# Patient Record
Sex: Female | Born: 1940 | Race: Black or African American | Hispanic: No | Smoking: Former smoker
Health system: Southern US, Community
[De-identification: ages and names within clinical notes are randomized; demographics above are authoritative.]

## PROBLEM LIST (undated history)

## (undated) DIAGNOSIS — E7800 Pure hypercholesterolemia, unspecified: Secondary | ICD-10-CM

## (undated) DIAGNOSIS — I209 Angina pectoris, unspecified: Secondary | ICD-10-CM

## (undated) DIAGNOSIS — IMO0001 Reserved for inherently not codable concepts without codable children: Secondary | ICD-10-CM

## (undated) DIAGNOSIS — D649 Anemia, unspecified: Secondary | ICD-10-CM

## (undated) DIAGNOSIS — Z8719 Personal history of other diseases of the digestive system: Secondary | ICD-10-CM

## (undated) DIAGNOSIS — J189 Pneumonia, unspecified organism: Secondary | ICD-10-CM

## (undated) DIAGNOSIS — I350 Nonrheumatic aortic (valve) stenosis: Secondary | ICD-10-CM

## (undated) DIAGNOSIS — Z952 Presence of prosthetic heart valve: Secondary | ICD-10-CM

## (undated) DIAGNOSIS — IMO0002 Reserved for concepts with insufficient information to code with codable children: Secondary | ICD-10-CM

## (undated) DIAGNOSIS — M1990 Unspecified osteoarthritis, unspecified site: Secondary | ICD-10-CM

## (undated) DIAGNOSIS — Z86010 Personal history of colonic polyps: Principal | ICD-10-CM

## (undated) DIAGNOSIS — R011 Cardiac murmur, unspecified: Secondary | ICD-10-CM

## (undated) DIAGNOSIS — K219 Gastro-esophageal reflux disease without esophagitis: Secondary | ICD-10-CM

## (undated) DIAGNOSIS — I10 Essential (primary) hypertension: Secondary | ICD-10-CM

## (undated) DIAGNOSIS — I2510 Atherosclerotic heart disease of native coronary artery without angina pectoris: Secondary | ICD-10-CM

## (undated) DIAGNOSIS — R42 Dizziness and giddiness: Secondary | ICD-10-CM

## (undated) HISTORY — DX: Nonrheumatic aortic (valve) stenosis: I35.0

## (undated) HISTORY — PX: COLONOSCOPY: SHX174

## (undated) HISTORY — DX: Reserved for inherently not codable concepts without codable children: IMO0001

## (undated) HISTORY — DX: Reserved for concepts with insufficient information to code with codable children: IMO0002

## (undated) HISTORY — DX: Personal history of colonic polyps: Z86.010

---
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
## 1969-03-05 DIAGNOSIS — J189 Pneumonia, unspecified organism: Secondary | ICD-10-CM

## 1969-03-05 HISTORY — DX: Pneumonia, unspecified organism: J18.9

## 1978-07-05 HISTORY — PX: TUBAL LIGATION: SHX77

## 1983-07-06 HISTORY — PX: ABDOMINAL HYSTERECTOMY: SHX81

## 1999-07-13 ENCOUNTER — Encounter: Admission: RE | Admit: 1999-07-13 | Discharge: 1999-08-18 | Payer: Self-pay | Admitting: Internal Medicine

## 1999-12-27 ENCOUNTER — Encounter: Payer: Self-pay | Admitting: Emergency Medicine

## 1999-12-27 ENCOUNTER — Emergency Department (HOSPITAL_COMMUNITY): Admission: EM | Admit: 1999-12-27 | Discharge: 1999-12-27 | Payer: Self-pay | Admitting: Emergency Medicine

## 2003-10-01 ENCOUNTER — Emergency Department (HOSPITAL_COMMUNITY): Admission: EM | Admit: 2003-10-01 | Discharge: 2003-10-01 | Payer: Self-pay | Admitting: Emergency Medicine

## 2004-03-30 ENCOUNTER — Emergency Department (HOSPITAL_COMMUNITY): Admission: EM | Admit: 2004-03-30 | Discharge: 2004-03-30 | Payer: Self-pay | Admitting: Family Medicine

## 2004-04-12 ENCOUNTER — Emergency Department (HOSPITAL_COMMUNITY): Admission: EM | Admit: 2004-04-12 | Discharge: 2004-04-12 | Payer: Self-pay | Admitting: Emergency Medicine

## 2004-06-03 ENCOUNTER — Ambulatory Visit: Payer: Self-pay | Admitting: Internal Medicine

## 2004-06-15 ENCOUNTER — Ambulatory Visit: Payer: Self-pay | Admitting: Internal Medicine

## 2005-02-25 ENCOUNTER — Encounter: Admission: RE | Admit: 2005-02-25 | Discharge: 2005-02-25 | Payer: Self-pay | Admitting: Orthopaedic Surgery

## 2005-03-22 ENCOUNTER — Encounter: Admission: RE | Admit: 2005-03-22 | Discharge: 2005-03-22 | Payer: Self-pay | Admitting: Orthopaedic Surgery

## 2005-04-08 ENCOUNTER — Encounter: Admission: RE | Admit: 2005-04-08 | Discharge: 2005-04-08 | Payer: Self-pay | Admitting: Orthopaedic Surgery

## 2006-05-02 ENCOUNTER — Emergency Department (HOSPITAL_COMMUNITY): Admission: EM | Admit: 2006-05-02 | Discharge: 2006-05-02 | Payer: Self-pay | Admitting: Family Medicine

## 2007-04-07 ENCOUNTER — Emergency Department (HOSPITAL_COMMUNITY): Admission: EM | Admit: 2007-04-07 | Discharge: 2007-04-07 | Payer: Self-pay | Admitting: Emergency Medicine

## 2007-08-25 ENCOUNTER — Emergency Department (HOSPITAL_COMMUNITY): Admission: EM | Admit: 2007-08-25 | Discharge: 2007-08-25 | Payer: Self-pay | Admitting: Emergency Medicine

## 2010-04-14 ENCOUNTER — Emergency Department (HOSPITAL_COMMUNITY): Admission: EM | Admit: 2010-04-14 | Discharge: 2010-04-14 | Payer: Self-pay | Admitting: Family Medicine

## 2010-07-26 ENCOUNTER — Encounter: Payer: Self-pay | Admitting: Orthopaedic Surgery

## 2010-10-23 ENCOUNTER — Other Ambulatory Visit (HOSPITAL_COMMUNITY): Payer: Self-pay | Admitting: Internal Medicine

## 2010-10-23 DIAGNOSIS — R011 Cardiac murmur, unspecified: Secondary | ICD-10-CM

## 2010-10-26 ENCOUNTER — Ambulatory Visit (HOSPITAL_COMMUNITY): Payer: Medicare Other | Attending: Internal Medicine | Admitting: Radiology

## 2010-10-26 DIAGNOSIS — E669 Obesity, unspecified: Secondary | ICD-10-CM | POA: Insufficient documentation

## 2010-10-26 DIAGNOSIS — R011 Cardiac murmur, unspecified: Secondary | ICD-10-CM | POA: Insufficient documentation

## 2010-10-26 DIAGNOSIS — I10 Essential (primary) hypertension: Secondary | ICD-10-CM | POA: Insufficient documentation

## 2010-10-26 DIAGNOSIS — F17200 Nicotine dependence, unspecified, uncomplicated: Secondary | ICD-10-CM | POA: Insufficient documentation

## 2013-02-01 ENCOUNTER — Other Ambulatory Visit (HOSPITAL_COMMUNITY): Payer: Self-pay | Admitting: Internal Medicine

## 2013-02-01 ENCOUNTER — Encounter (HOSPITAL_COMMUNITY): Payer: Self-pay | Admitting: Internal Medicine

## 2013-02-01 DIAGNOSIS — R011 Cardiac murmur, unspecified: Secondary | ICD-10-CM

## 2013-02-02 ENCOUNTER — Other Ambulatory Visit (HOSPITAL_COMMUNITY): Payer: Medicare Other

## 2013-02-07 ENCOUNTER — Ambulatory Visit (HOSPITAL_COMMUNITY): Payer: Medicare Other | Attending: Internal Medicine | Admitting: Radiology

## 2013-02-07 DIAGNOSIS — E669 Obesity, unspecified: Secondary | ICD-10-CM | POA: Insufficient documentation

## 2013-02-07 DIAGNOSIS — I10 Essential (primary) hypertension: Secondary | ICD-10-CM | POA: Insufficient documentation

## 2013-02-07 DIAGNOSIS — I080 Rheumatic disorders of both mitral and aortic valves: Secondary | ICD-10-CM | POA: Insufficient documentation

## 2013-02-07 DIAGNOSIS — R011 Cardiac murmur, unspecified: Secondary | ICD-10-CM

## 2013-02-07 DIAGNOSIS — I359 Nonrheumatic aortic valve disorder, unspecified: Secondary | ICD-10-CM

## 2013-02-07 DIAGNOSIS — F17200 Nicotine dependence, unspecified, uncomplicated: Secondary | ICD-10-CM | POA: Insufficient documentation

## 2013-02-07 NOTE — Progress Notes (Signed)
Echocardiogram performed.  

## 2013-08-07 ENCOUNTER — Ambulatory Visit (HOSPITAL_COMMUNITY)
Admission: RE | Admit: 2013-08-07 | Discharge: 2013-08-07 | Disposition: A | Discharge status: Home / Self Care | Payer: Medicare Other | Source: Ambulatory Visit | Attending: Cardiology | Admitting: Cardiology

## 2013-08-07 ENCOUNTER — Encounter (HOSPITAL_COMMUNITY)
Admission: RE | Disposition: A | Discharge status: Home / Self Care | Payer: Self-pay | Source: Ambulatory Visit | Attending: Cardiology

## 2013-08-07 DIAGNOSIS — I2789 Other specified pulmonary heart diseases: Secondary | ICD-10-CM | POA: Insufficient documentation

## 2013-08-07 DIAGNOSIS — R0989 Other specified symptoms and signs involving the circulatory and respiratory systems: Secondary | ICD-10-CM | POA: Insufficient documentation

## 2013-08-07 DIAGNOSIS — I739 Peripheral vascular disease, unspecified: Secondary | ICD-10-CM | POA: Insufficient documentation

## 2013-08-07 DIAGNOSIS — F17200 Nicotine dependence, unspecified, uncomplicated: Secondary | ICD-10-CM | POA: Insufficient documentation

## 2013-08-07 DIAGNOSIS — I10 Essential (primary) hypertension: Secondary | ICD-10-CM | POA: Insufficient documentation

## 2013-08-07 DIAGNOSIS — I2584 Coronary atherosclerosis due to calcified coronary lesion: Secondary | ICD-10-CM | POA: Insufficient documentation

## 2013-08-07 DIAGNOSIS — E785 Hyperlipidemia, unspecified: Secondary | ICD-10-CM | POA: Insufficient documentation

## 2013-08-07 DIAGNOSIS — I2510 Atherosclerotic heart disease of native coronary artery without angina pectoris: Secondary | ICD-10-CM | POA: Insufficient documentation

## 2013-08-07 DIAGNOSIS — I2582 Chronic total occlusion of coronary artery: Secondary | ICD-10-CM | POA: Insufficient documentation

## 2013-08-07 DIAGNOSIS — K219 Gastro-esophageal reflux disease without esophagitis: Secondary | ICD-10-CM | POA: Insufficient documentation

## 2013-08-07 DIAGNOSIS — R0609 Other forms of dyspnea: Secondary | ICD-10-CM | POA: Insufficient documentation

## 2013-08-07 DIAGNOSIS — R9439 Abnormal result of other cardiovascular function study: Secondary | ICD-10-CM | POA: Insufficient documentation

## 2013-08-07 DIAGNOSIS — M129 Arthropathy, unspecified: Secondary | ICD-10-CM | POA: Insufficient documentation

## 2013-08-07 HISTORY — PX: LEFT AND RIGHT HEART CATHETERIZATION WITH CORONARY ANGIOGRAM: SHX5449

## 2013-08-07 HISTORY — PX: CARDIAC CATHETERIZATION: SHX172

## 2013-08-07 LAB — POCT I-STAT 3, ART BLOOD GAS (G3+)
Acid-Base Excess: 1 mmol/L (ref 0.0–2.0)
Bicarbonate: 26.5 mEq/L — ABNORMAL HIGH (ref 20.0–24.0)
O2 Saturation: 94 %
TCO2: 28 mmol/L (ref 0–100)
pCO2 arterial: 46.5 mmHg — ABNORMAL HIGH (ref 35.0–45.0)
pH, Arterial: 7.364 (ref 7.350–7.450)
pO2, Arterial: 74 mmHg — ABNORMAL LOW (ref 80.0–100.0)

## 2013-08-07 LAB — POCT I-STAT 3, VENOUS BLOOD GAS (G3P V)
Acid-Base Excess: 1 mmol/L (ref 0.0–2.0)
Bicarbonate: 27.6 mEq/L — ABNORMAL HIGH (ref 20.0–24.0)
O2 Saturation: 67 %
TCO2: 29 mmol/L (ref 0–100)
pCO2, Ven: 51.2 mmHg — ABNORMAL HIGH (ref 45.0–50.0)
pH, Ven: 7.34 — ABNORMAL HIGH (ref 7.250–7.300)
pO2, Ven: 38 mmHg (ref 30.0–45.0)

## 2013-08-07 SURGERY — LEFT AND RIGHT HEART CATHETERIZATION WITH CORONARY ANGIOGRAM
Anesthesia: LOCAL

## 2013-08-07 MED ORDER — SODIUM CHLORIDE 0.9 % IV SOLN: 1.0000 mL/kg/h | INTRAVENOUS | Status: DC

## 2013-08-07 MED ORDER — ASPIRIN 81 MG PO CHEW
81.0000 mg | CHEWABLE_TABLET | ORAL | Status: AC
  Administered 2013-08-07: 81 mg via ORAL
  Filled 2013-08-07: qty 1

## 2013-08-07 MED ORDER — ONDANSETRON HCL 4 MG/2ML IJ SOLN: 4.0000 mg | Freq: Four times a day (QID) | INTRAMUSCULAR | Status: DC | PRN

## 2013-08-07 MED ORDER — PRASUGREL HCL 10 MG PO TABS: 10.0000 mg | ORAL_TABLET | Freq: Every day | ORAL | Status: DC

## 2013-08-07 MED ORDER — SODIUM CHLORIDE 0.9 % IV SOLN
INTRAVENOUS | Status: DC
  Administered 2013-08-07: 08:00:00 via INTRAVENOUS

## 2013-08-07 MED ORDER — ACETAMINOPHEN 325 MG PO TABS: 650.0000 mg | ORAL_TABLET | ORAL | Status: DC | PRN

## 2013-08-07 MED ORDER — SODIUM CHLORIDE 0.9 % IJ SOLN: 3.0000 mL | INTRAMUSCULAR | Status: DC | PRN

## 2013-08-07 MED ORDER — VERAPAMIL HCL 2.5 MG/ML IV SOLN
INTRAVENOUS | Status: AC
  Filled 2013-08-07: qty 2

## 2013-08-07 MED ORDER — LIDOCAINE HCL (PF) 1 % IJ SOLN
INTRAMUSCULAR | Status: AC
  Filled 2013-08-07: qty 30

## 2013-08-07 MED ORDER — MIDAZOLAM HCL 2 MG/2ML IJ SOLN
INTRAMUSCULAR | Status: AC
  Filled 2013-08-07: qty 2

## 2013-08-07 MED ORDER — NITROGLYCERIN 0.4 MG SL SUBL: 0.4000 mg | SUBLINGUAL_TABLET | SUBLINGUAL | Status: AC | PRN

## 2013-08-07 MED ORDER — HEPARIN (PORCINE) IN NACL 2-0.9 UNIT/ML-% IJ SOLN
INTRAMUSCULAR | Status: AC
  Filled 2013-08-07: qty 1000

## 2013-08-07 MED ORDER — ISOSORBIDE MONONITRATE ER 60 MG PO TB24: 60.0000 mg | ORAL_TABLET | Freq: Every day | ORAL | Status: AC

## 2013-08-07 MED ORDER — HYDROMORPHONE HCL PF 1 MG/ML IJ SOLN
INTRAMUSCULAR | Status: AC
  Filled 2013-08-07: qty 1

## 2013-08-07 MED ORDER — SODIUM CHLORIDE 0.9 % IV SOLN: 250.0000 mL | INTRAVENOUS | Status: DC | PRN

## 2013-08-07 MED ORDER — ATORVASTATIN CALCIUM 80 MG PO TABS: 80.0000 mg | ORAL_TABLET | Freq: Every day | ORAL | Status: DC

## 2013-08-07 MED ORDER — NITROGLYCERIN 0.2 MG/ML ON CALL CATH LAB
INTRAVENOUS | Status: AC
  Filled 2013-08-07: qty 1

## 2013-08-07 MED ORDER — SODIUM CHLORIDE 0.9 % IJ SOLN: 3.0000 mL | Freq: Two times a day (BID) | INTRAMUSCULAR | Status: DC

## 2013-08-07 NOTE — H&P (Signed)
  Please see office visit notes for complete details of HPI.  

## 2013-08-07 NOTE — Discharge Instructions (Signed)

## 2013-08-07 NOTE — Interval H&P Note (Signed)
History and Physical Interval Note:  08/09/5807 9:83 AM  Armanda Magic Stokely  has presented today for surgery, with the diagnosis of abnormal stress test/shortness of breath  The various methods of treatment have been discussed with the patient and family. After consideration of risks, benefits and other options for treatment, the patient has consented to  Procedure(s): LEFT AND RIGHT HEART CATHETERIZATION WITH CORONARY ANGIOGRAM (N/A) possible angioplasty as a surgical intervention .  The patient's history has been reviewed, patient examined, no change in status, stable for surgery.  I have reviewed the patient's chart and labs.  Questions were answered to the patient's satisfaction.   Cath Lab Visit (complete for each Cath Lab visit)  Clinical Evaluation Leading to the Procedure:   ACS: no  Non-ACS:    Anginal Classification: CCS III  Anti-ischemic medical therapy: Minimal Therapy (1 class of medications)  Non-Invasive Test Results: Intermediate-risk stress test findings: cardiac mortality 1-3%/year  Prior CABG: No previous CABG        [facility]

## 2013-08-08 NOTE — CV Procedure (Signed)
Procedures performed: Right and left heart catheterization and calculation of cardiac output and cardiac index by Fick. Right radial artery access and Right antecubital vein access was utilized for performing the procedure.   Indication: Patient is a 73 year-old female with shortness of breath and dyspnea on exertion.  She had undergone initially a routine treadmill excess stress test, but she was unable to walk even for one and one half minutes and had to be discontinued due to severe dyspnea.  Eventual Lexiscan stress test was performed on 04/27/2013 and this revealed severe inferior, inferoapical and apical ischemia.  Due to markedly abnormal stress test and class III symptoms of dyspnea she was brought to the angiography suite to evaluate for coronary anatomy.  Right heart catheterization is being performed to evaluate for pulmonary hypertension.  Right heart catheterization technique: A 5 French  sheath introduced into right AC vein access. A 5 French Swan-Ganz catheter was advanced with balloon inflated on the sheath under fluoroscopic guidance into first the right atrium followed by the right ventricle and into the pulmonary artery to pulmonary artery wedge position. Hemodynamics were obtained in a locations.  After hemodynamics were completed, samples were taken for SaO2% measurement to be used in [facility]Index catheterization.  The catheter was then pulled back the balloon down and then completely out of the body.   Left Heart Catheterization   First a 36F [place] TIG 4 catheter was advanced over standard J-wire into the ascending aorta and used to engage first the Left and Right Coronary Artery. Multiple cineangiographic views of the Left then Right Coronary Artery system(s) were performed.  A Same catheter was used to cross the aortic valve for measurement Left Ventricular Hemodynamics without any difficulty. Left ventriculography was then performed in the RAO projection. Hemodynamics were then  resampled and the catheter pulled back across the aortic valve for measurement of "pullback" gradient. The catheter was then removed the body over wire. All exchanges were made over standard J wire. Intracoronary nitroglycerin was administered at various times.  Procedural data:  Right Heart: RA pressure 11/7  Mean 6 mm mercury. .  RV pressure 33/3 and Right ventricular EDP 6 mm Hg. PA pressure 32/12 with a mean of 21  mm mercury. PA saturation 67%.  Pulmonary capillary wedge 16/11 with a mean of 12 mm Hg. Aortic saturation 94%.  Cardiac output was 4.92 with cardiac index of 2.37  by Fick.   Left Heart hemodynamics: left ventricular pressure was 010/2 with end-diastolic Escher 13 mmHg.  Aortic pressure 141/80 with a mean of 103 mmHg.  There was no significant pressure gradient across the aortic valve.  There is no evidence of aortic stenosis.  Angiographic data  Left ventricle: Performed.  Left systolic shows normal ejection fraction of 55-60% No regional wall motion abnormality.  Right coronary artery: it's a dominant vessel, is occluded in the midsegment, faint ipsilateral collateral filling of the distal vessel is evident.  Total occlusion occurs just proximal to the bifurcation of PLV and PDA branches.  The PDA and by mouth branches are collateralized by the left system.  The vessel is at least 2.5 mm and appears to be amenable for percutaneous revascularization.  Left main coronary artery: Normal. No stenosis.   LAD: Large.Krystal Clark origin to a moderate sized diagonal 1, D2  Which have mild ostial disease.  The LAD itself as mild noncritical coronary artery disease in the proximal and midsegment, mild calcification is also evident.  Distal LAD near the apex  has a eccentric 80-90% stenosis.   Circumflex coronary artery: It is codominant with the right coronary artery. It s a very large vessel.  Proximal circumflex has tandem 90-95% stenosis followed by ectasia in both poststenotic areas.  This  is followed by origin of a large OM1.  At the bifurcation of circumflex and OM1 there is a 70-80% stenosis followed by a distal circumflex which has a 90% stenosis.  The anatomy is very complex.  The presence of ectasia and presence of bifurcation stenosis places this at a high risk anatomy.  The anatomy is still conducive for percutaneously revascularization.  Recommendation: I discussed the findings of the precatheterization with patient. Patient will be followed up in the office to discuss revascularization strategy, the anatomy appears to be feasible for percutaneous coronary angioplasty.  The right coronary artery also appears to be amenable for percutaneous revascularization.  I would like to see her back in the office, I have started her on Imdur and also Brilinta 90 mg by mouth twice a day in preparation for angioplasty. She has mild pulmonary hypertension, preserved cardiac output and cardiac index.  No evidence of aortic stenosis.

## 2013-08-16 ENCOUNTER — Encounter (HOSPITAL_COMMUNITY): Payer: Self-pay | Admitting: Pharmacy Technician

## 2013-08-17 ENCOUNTER — Ambulatory Visit (HOSPITAL_COMMUNITY)
Admission: RE | Admit: 2013-08-17 | Discharge: 2013-08-19 | Disposition: A | Discharge status: Home / Self Care | Payer: Medicare Other | Source: Ambulatory Visit | Attending: Cardiology | Admitting: Cardiology

## 2013-08-17 ENCOUNTER — Encounter (HOSPITAL_COMMUNITY)
Admission: RE | Disposition: A | Discharge status: Home / Self Care | Payer: Medicare Other | Source: Ambulatory Visit | Attending: Cardiology

## 2013-08-17 ENCOUNTER — Encounter (HOSPITAL_COMMUNITY): Payer: Self-pay | Admitting: General Practice

## 2013-08-17 DIAGNOSIS — E669 Obesity, unspecified: Secondary | ICD-10-CM | POA: Insufficient documentation

## 2013-08-17 DIAGNOSIS — Z6834 Body mass index (BMI) 34.0-34.9, adult: Secondary | ICD-10-CM | POA: Insufficient documentation

## 2013-08-17 DIAGNOSIS — I10 Essential (primary) hypertension: Secondary | ICD-10-CM | POA: Diagnosis not present

## 2013-08-17 DIAGNOSIS — I209 Angina pectoris, unspecified: Secondary | ICD-10-CM | POA: Diagnosis not present

## 2013-08-17 DIAGNOSIS — I2582 Chronic total occlusion of coronary artery: Secondary | ICD-10-CM | POA: Diagnosis not present

## 2013-08-17 DIAGNOSIS — Z9861 Coronary angioplasty status: Secondary | ICD-10-CM

## 2013-08-17 DIAGNOSIS — K219 Gastro-esophageal reflux disease without esophagitis: Secondary | ICD-10-CM | POA: Insufficient documentation

## 2013-08-17 DIAGNOSIS — I2510 Atherosclerotic heart disease of native coronary artery without angina pectoris: Secondary | ICD-10-CM

## 2013-08-17 DIAGNOSIS — E785 Hyperlipidemia, unspecified: Secondary | ICD-10-CM | POA: Insufficient documentation

## 2013-08-17 HISTORY — PX: CORONARY ANGIOPLASTY WITH STENT PLACEMENT: SHX49

## 2013-08-17 HISTORY — DX: Personal history of other diseases of the digestive system: Z87.19

## 2013-08-17 HISTORY — DX: Pneumonia, unspecified organism: J18.9

## 2013-08-17 HISTORY — PX: PERCUTANEOUS CORONARY STENT INTERVENTION (PCI-S): SHX5485

## 2013-08-17 HISTORY — DX: Cardiac murmur, unspecified: R01.1

## 2013-08-17 HISTORY — DX: Pure hypercholesterolemia, unspecified: E78.00

## 2013-08-17 HISTORY — DX: Gastro-esophageal reflux disease without esophagitis: K21.9

## 2013-08-17 HISTORY — DX: Unspecified osteoarthritis, unspecified site: M19.90

## 2013-08-17 HISTORY — DX: Essential (primary) hypertension: I10

## 2013-08-17 LAB — BASIC METABOLIC PANEL
BUN: 19 mg/dL (ref 6–23)
CO2: 25 mEq/L (ref 19–32)
Calcium: 9 mg/dL (ref 8.4–10.5)
Chloride: 105 mEq/L (ref 96–112)
Creatinine, Ser: 1.03 mg/dL (ref 0.50–1.10)
GFR calc Af Amer: 62 mL/min — ABNORMAL LOW (ref >90)
GFR calc non Af Amer: 54 mL/min — ABNORMAL LOW (ref >90)
Glucose, Bld: 103 mg/dL — ABNORMAL HIGH (ref 70–99)
Potassium: 4 mEq/L (ref 3.7–5.3)
Sodium: 141 mEq/L (ref 137–147)

## 2013-08-17 LAB — CBC
HCT: 36.4 % (ref 36.0–46.0)
Hemoglobin: 13.2 g/dL (ref 12.0–15.0)
MCH: 28.5 pg (ref 26.0–34.0)
MCHC: 36.3 g/dL — ABNORMAL HIGH (ref 30.0–36.0)
MCV: 78.6 fL (ref 78.0–100.0)
Platelets: 204 10*3/uL (ref 150–400)
RBC: 4.63 MIL/uL (ref 3.87–5.11)
RDW: 14.4 % (ref 11.5–15.5)
WBC: 6.9 10*3/uL (ref 4.0–10.5)

## 2013-08-17 LAB — PROTIME-INR
INR: 1.09 (ref 0.00–1.49)
Prothrombin Time: 13.9 seconds (ref 11.6–15.2)

## 2013-08-17 LAB — MRSA PCR SCREENING: MRSA by PCR: NEGATIVE

## 2013-08-17 LAB — POCT ACTIVATED CLOTTING TIME: Activated Clotting Time: 409 seconds

## 2013-08-17 SURGERY — PERCUTANEOUS CORONARY STENT INTERVENTION (PCI-S)
Anesthesia: LOCAL

## 2013-08-17 MED ORDER — ALUM & MAG HYDROXIDE-SIMETH 200-200-20 MG/5ML PO SUSP: 30.0000 mL | ORAL | Status: DC | PRN

## 2013-08-17 MED ORDER — TICAGRELOR 90 MG PO TABS
ORAL_TABLET | ORAL | Status: AC
  Filled 2013-08-17: qty 1

## 2013-08-17 MED ORDER — ONDANSETRON HCL 4 MG/2ML IJ SOLN
INTRAMUSCULAR | Status: AC
  Filled 2013-08-17: qty 2

## 2013-08-17 MED ORDER — HYDROMORPHONE HCL PF 1 MG/ML IJ SOLN
INTRAMUSCULAR | Status: AC
  Filled 2013-08-17: qty 1

## 2013-08-17 MED ORDER — ONDANSETRON HCL 4 MG/2ML IJ SOLN
4.0000 mg | Freq: Four times a day (QID) | INTRAMUSCULAR | Status: DC | PRN
  Administered 2013-08-17: 4 mg via INTRAVENOUS
  Filled 2013-08-17: qty 2

## 2013-08-17 MED ORDER — SODIUM CHLORIDE 0.9 % IV SOLN
INTRAVENOUS | Status: DC
  Administered 2013-08-17: 07:00:00 via INTRAVENOUS

## 2013-08-17 MED ORDER — SODIUM CHLORIDE 0.9 % IJ SOLN: 3.0000 mL | INTRAMUSCULAR | Status: DC | PRN

## 2013-08-17 MED ORDER — BIVALIRUDIN 250 MG IV SOLR
INTRAVENOUS | Status: AC
  Filled 2013-08-17: qty 250

## 2013-08-17 MED ORDER — MAGNESIUM HYDROXIDE 400 MG/5ML PO SUSP
30.0000 mL | Freq: Every day | ORAL | Status: DC | PRN
  Administered 2013-08-19: 30 mL via ORAL
  Filled 2013-08-17: qty 30

## 2013-08-17 MED ORDER — SODIUM CHLORIDE 0.9 % IJ SOLN
3.0000 mL | Freq: Two times a day (BID) | INTRAMUSCULAR | Status: DC
  Administered 2013-08-17 – 2013-08-19 (×4): 3 mL via INTRAVENOUS

## 2013-08-17 MED ORDER — SODIUM CHLORIDE 0.9 % IV SOLN: 250.0000 mL | INTRAVENOUS | Status: DC | PRN

## 2013-08-17 MED ORDER — SODIUM CHLORIDE 0.9 % IV SOLN: 1.0000 mL/kg/h | INTRAVENOUS | Status: AC

## 2013-08-17 MED ORDER — ASPIRIN 81 MG PO CHEW
81.0000 mg | CHEWABLE_TABLET | Freq: Every day | ORAL | Status: DC
  Administered 2013-08-18 – 2013-08-19 (×2): 81 mg via ORAL
  Filled 2013-08-17 (×2): qty 1

## 2013-08-17 MED ORDER — TICAGRELOR 90 MG PO TABS
90.0000 mg | ORAL_TABLET | Freq: Two times a day (BID) | ORAL | Status: AC
  Administered 2013-08-17: 90 mg via ORAL
  Filled 2013-08-17: qty 1

## 2013-08-17 MED ORDER — TICAGRELOR 90 MG PO TABS
90.0000 mg | ORAL_TABLET | Freq: Two times a day (BID) | ORAL | Status: DC
  Administered 2013-08-17 – 2013-08-19 (×4): 90 mg via ORAL
  Filled 2013-08-17 (×5): qty 1

## 2013-08-17 MED ORDER — MIDAZOLAM HCL 2 MG/2ML IJ SOLN
INTRAMUSCULAR | Status: AC
  Filled 2013-08-17: qty 2

## 2013-08-17 MED ORDER — GUAIFENESIN-DM 100-10 MG/5ML PO SYRP: 15.0000 mL | ORAL_SOLUTION | ORAL | Status: DC | PRN

## 2013-08-17 MED ORDER — BIVALIRUDIN 250 MG IV SOLR
0.2500 mg/kg/h | INTRAVENOUS | Status: DC
  Filled 2013-08-17: qty 250

## 2013-08-17 MED ORDER — HEPARIN (PORCINE) IN NACL 2-0.9 UNIT/ML-% IJ SOLN
INTRAMUSCULAR | Status: AC
  Filled 2013-08-17: qty 1000

## 2013-08-17 MED ORDER — NITROGLYCERIN 0.2 MG/ML ON CALL CATH LAB
INTRAVENOUS | Status: AC
  Filled 2013-08-17: qty 1

## 2013-08-17 MED ORDER — SODIUM CHLORIDE 0.9 % IV SOLN
0.2500 mg/kg/h | INTRAVENOUS | Status: DC
  Filled 2013-08-17: qty 250

## 2013-08-17 MED ORDER — LIDOCAINE HCL (PF) 1 % IJ SOLN
INTRAMUSCULAR | Status: AC
  Filled 2013-08-17: qty 30

## 2013-08-17 MED ORDER — SPIRONOLACTONE 25 MG PO TABS
25.0000 mg | ORAL_TABLET | Freq: Every day | ORAL | Status: DC
  Administered 2013-08-18 – 2013-08-19 (×2): 25 mg via ORAL
  Filled 2013-08-17 (×2): qty 1

## 2013-08-17 MED ORDER — SODIUM CHLORIDE 0.9 % IJ SOLN
3.0000 mL | INTRAMUSCULAR | Status: DC | PRN
  Administered 2013-08-17: 3 mL via INTRAVENOUS

## 2013-08-17 MED ORDER — ISOSORBIDE MONONITRATE ER 60 MG PO TB24
60.0000 mg | ORAL_TABLET | Freq: Every day | ORAL | Status: DC
  Administered 2013-08-18 – 2013-08-19 (×2): 60 mg via ORAL
  Filled 2013-08-17 (×2): qty 1

## 2013-08-17 MED ORDER — TRAMADOL HCL 50 MG PO TABS: 50.0000 mg | ORAL_TABLET | Freq: Three times a day (TID) | ORAL | Status: DC | PRN

## 2013-08-17 MED ORDER — METOPROLOL SUCCINATE ER 50 MG PO TB24
50.0000 mg | ORAL_TABLET | Freq: Every day | ORAL | Status: DC
  Administered 2013-08-18 – 2013-08-19 (×2): 50 mg via ORAL
  Filled 2013-08-17 (×2): qty 1

## 2013-08-17 MED ORDER — ASPIRIN 81 MG PO CHEW
81.0000 mg | CHEWABLE_TABLET | ORAL | Status: AC
  Administered 2013-08-17: 81 mg via ORAL
  Filled 2013-08-17: qty 1

## 2013-08-17 MED ORDER — ATORVASTATIN CALCIUM 80 MG PO TABS
80.0000 mg | ORAL_TABLET | Freq: Every day | ORAL | Status: DC
  Administered 2013-08-17 – 2013-08-18 (×2): 80 mg via ORAL
  Filled 2013-08-17 (×3): qty 1

## 2013-08-17 MED ORDER — PANTOPRAZOLE SODIUM 40 MG PO TBEC
40.0000 mg | DELAYED_RELEASE_TABLET | Freq: Every day | ORAL | Status: DC
  Administered 2013-08-17 – 2013-08-19 (×3): 40 mg via ORAL
  Filled 2013-08-17 (×3): qty 1

## 2013-08-17 MED ORDER — SODIUM CHLORIDE 0.9 % IJ SOLN: 3.0000 mL | Freq: Two times a day (BID) | INTRAMUSCULAR | Status: DC

## 2013-08-17 NOTE — Consult Note (Signed)
  Please see office visit notes for complete details of HPI.  

## 2013-08-17 NOTE — Progress Notes (Signed)
Patient post cath arrived to room 6c03 with vomiting and sheath in right groin. Zofran given and vomiting resolved she is eating dinner. Sheath removed from groin at 1310 and doing well no complications right groin level 0. Patient will transfer to 2h18 report called. Patient belongings at beside going with patient including her purse. No family has been here yet but she states they are coming. At this time patient is stable and transferring to new room and patient is aware of plan of care.

## 2013-08-17 NOTE — Progress Notes (Signed)
Site area: right groin  Site Prior to Removal:  Level 0  Pressure Applied For 20 MINUTES    Minutes Beginning at 1310  Manual:   yes  Patient Status During Pull:  stable  Post Pull Groin Site:  Level 0  Post Pull Instructions Given:  yes  Post Pull Pulses Present:  yes  Dressing Applied:  yes  Comments:  Gauze pressure dressing applied. Rechecked at 1345 and no change in assessment.

## 2013-08-17 NOTE — Brief Op Note (Signed)
08/17/2013  54:65 AM  PATIENT:  Armanda Magic Tukes  73 y.o. female  PRE-OPERATIVE DIAGNOSIS:  CAD  POST-OPERATIVE DIAGNOSIS: CAD PROCEDURE:  Procedure(s): PERCUTANEOUS CORONARY STENT INTERVENTION (PCI-S) (N/A):  Successful PTCA and stenting of the proximal mid and distal circumflex coronary arteries with implantation of drug-eluting stents, high-grade 99% to 0%. Successful PTCA and stenting of chronic total occlusion of right coronary artery with implantation of 2 overlapping drug-eluting stents into the mid segment and into the proximal segment.  SURGEON:  Surgeon(s) and Role:    * Laverda Page, MD - Primary

## 2013-08-17 NOTE — Progress Notes (Signed)
Nutrition Brief Note  Patient identified on the Malnutrition Screening Tool (MST) Report  Pt reports weight loss due to healthy diet changes.  Reinforced these and answered all questions.   Wt Readings from Last 15 Encounters:  08/17/13 213 lb (96.616 kg)  08/17/13 213 lb (96.616 kg)  08/07/13 219 lb (99.338 kg)  08/07/13 219 lb (99.338 kg)    Body mass index is 34.4 kg/(m^2). Patient meets criteria for obesity class I based on current BMI.   Current diet order is Heart Healthy. Labs and medications reviewed.   No nutrition interventions warranted at this time. If nutrition issues arise, please consult RD.   [place], [place], [place] Pager [phone] After Hours Pager

## 2013-08-18 DIAGNOSIS — I209 Angina pectoris, unspecified: Secondary | ICD-10-CM | POA: Diagnosis not present

## 2013-08-18 DIAGNOSIS — I2582 Chronic total occlusion of coronary artery: Secondary | ICD-10-CM | POA: Diagnosis not present

## 2013-08-18 DIAGNOSIS — I10 Essential (primary) hypertension: Secondary | ICD-10-CM | POA: Diagnosis not present

## 2013-08-18 DIAGNOSIS — I2510 Atherosclerotic heart disease of native coronary artery without angina pectoris: Secondary | ICD-10-CM | POA: Diagnosis not present

## 2013-08-18 LAB — BASIC METABOLIC PANEL
BUN: 16 mg/dL (ref 6–23)
CO2: 25 mEq/L (ref 19–32)
Calcium: 8.6 mg/dL (ref 8.4–10.5)
Chloride: 104 mEq/L (ref 96–112)
Creatinine, Ser: 0.87 mg/dL (ref 0.50–1.10)
GFR calc Af Amer: 76 mL/min — ABNORMAL LOW (ref >90)
GFR calc non Af Amer: 66 mL/min — ABNORMAL LOW (ref >90)
Glucose, Bld: 101 mg/dL — ABNORMAL HIGH (ref 70–99)
Potassium: 3.9 mEq/L (ref 3.7–5.3)
Sodium: 140 mEq/L (ref 137–147)

## 2013-08-18 LAB — CBC
HCT: 34.7 % — ABNORMAL LOW (ref 36.0–46.0)
Hemoglobin: 12.5 g/dL (ref 12.0–15.0)
MCH: 28.5 pg (ref 26.0–34.0)
MCHC: 36 g/dL (ref 30.0–36.0)
MCV: 79 fL (ref 78.0–100.0)
Platelets: 188 10*3/uL (ref 150–400)
RBC: 4.39 MIL/uL (ref 3.87–5.11)
RDW: 14.5 % (ref 11.5–15.5)
WBC: 9.1 10*3/uL (ref 4.0–10.5)

## 2013-08-18 NOTE — Progress Notes (Addendum)
CARDIAC REHAB PHASE I   PRE:  Rate/Rhythm: SR -87  BP:  Supine:  Sitting: 131/63  Standing:    SaO2: RA 97  MODE:  Ambulation: 350 ft   POST:  Rate/Rhythm: 95 SR  BP:  Supine:   Sitting: 13/45  Standing:    SaO2: RA 99 Pt ambulated to bathroom to brush teeth and up in the hallway with rehab staff x 1 min. Assist 350 feet.  Pt tolerated well with minor complaint of fatigue toward the end of ambulation with some shortness of breath that resolved when she sat in the chair.  Education completed at bedside due to pt anticipation of discharge later today.  Reviewed and gave handouts on tobacco cessation, exercise guidelines, heart healthy diet, NTG protocol  alert 911 for unrelieved cp and the importance of medication compliance. Pt ok for contact information to be given to outpatient cardiac rehab for Phase II participation.  Questions answered, verbalized understanding. Cherre Huger, BSN [phone]

## 2013-08-18 NOTE — Progress Notes (Signed)
Subjective:  Feels well, no chest pain or shortness of breath. She's not had any burning sensations just because of anginal equivalent, I waited for 500 feet in the hallway.  Objective:  Vital Signs in the last 24 hours: Temp:  [97.3 F (36.3 C)-99 F (37.2 C)] 98.8 F (37.1 C) (02/14 0800) Pulse Rate:  [57-80] 80 (02/14 0419) Resp:  [16-18] 18 (02/14 0800) BP: (110-156)/(44-92) 117/72 mmHg (02/14 0800) SpO2:  [95 %-100 %] 96 % (02/14 0800) Weight:  [96.4 kg (212 lb 8.4 oz)] 96.4 kg (212 lb 8.4 oz) (02/14 0419)  Intake/Output from previous day: 02/13 0701 - 02/14 0700 In: 986.4 [P.O.:600; I.V.:386.4] Out: 750 [Urine:750]  Physical Exam:   General appearance: alert, cooperative, appears stated age, no distress and mildly obese Eyes: negative findings: lids and lashes normal Neck: no adenopathy, no carotid bruit, no JVD, supple, symmetrical, trachea midline and thyroid not enlarged, symmetric, no tenderness/mass/nodules Neck: JVP - normal, carotids 2+= without bruits Resp: clear to auscultation bilaterally Chest wall: no tenderness Cardio: S1, S2 normal, 2/6 systolic ejection murmur heard in the aortic area conducted to the carotids.  GI: soft, non-tender; bowel sounds normal; no masses,  no organomegaly Extremities: extremities normal, atraumatic, no cyanosis or edema and Right groin site without any hematoma or bruising.    Lab Results:  Recent Labs  08/17/13 0621 08/18/13 0300  WBC 6.9 9.1  HGB 13.2 12.5  PLT 204 188    Recent Labs  08/17/13 0621 08/18/13 0300  NA 141 140  K 4.0 3.9  CL 105 104  CO2 25 25  GLUCOSE 103* 101*  BUN 19 16  CREATININE 1.03 0.87   No results found for this basename: TROPONINI, CK, MB,  in the last 72 hours Hepatic Function Panel No results found for this basename: PROT, ALBUMIN, AST, ALT, ALKPHOS, BILITOT, BILIDIR, IBILI,  in the last 72 hours No results found for this basename: CHOL,  in the last 72 hours No results found for  this basename: PROTIME,  in the last 72 hours Lipid Panel  No results found for this basename: chol, trig, hdl, cholhdl, vldl, ldlcalc     Cardiac Studies:  EKG: normal EKG, normal sinus rhythm, unchanged from previous tracings.   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 Status post PTCA of the circumflex coronary artery and right coronary artery 3. Hypertension 4. Hyperlipidemia  Recommendation: I will keep the patient one more day as she has received 430 cc of contrast, ambulate in the hallway,, make sure that she does not have any chest pain and then possibly discharge her home in the morning. Her right groin site has remained stable without any hematoma. I will repeat BMP in the morning.    Laverda Page, M.D. 08/18/2013, 10:58 AM [facility] Pager: [phone] Office: [phone] If no answer: [phone]

## 2013-08-18 NOTE — Progress Notes (Signed)
Pt transferred to 2w13 from [place]; VSS; pt denies pain at this time; pt states she just feels tired and would like to rest; pt anticipating d/c home tomorrow; will cont. To monitor.

## 2013-08-18 NOTE — CV Procedure (Addendum)
Procedure performed: 08/17/2013: Stenting of Proximal and distal circumflex with 4 DES stents. 3.0x12, 3.0x16 promus Premier drug-eluting stents sandwiched in proximal circumflex, mid 2.5x18 Xience Alpine DES and distal 2.5x16 mm promus Premier DES.  Stenting of CTO  RCA. Mid to distal RCA 3.0x38 and proximal 3.0x28 mm promus Premier DES.  Indication: Patient is a fairly active 73 year old African American female who undergone coronary angiography on 08/07/2013 for markedly abnormal stress test, shortness of breath. Patient was able to walk only for 1.5 minutes on the treadmill with marked dyspnea, leading to [facility] which revealed severe inferior and lateral wall ischemia. Coronary angiography had revealed CTO of a large RCA faintly collateralized to the left system, and very complex high-grade stenosis of the proximal and distal circumflex coronary artery, midsegment at OM1 also revealing about 70-80% stenosis with involvement of large OM1 ostium about 80%, OM 2 which is small with ostial 50-60% stenosis. In spite of aggressive medical therapy due to ongoing symptoms of dyspnea, also on her office visit patient stated that she's been having burning sensations just with activity, that she had not previously mentioned, was thinking that this is related to GERD, and this chest pain was relieved with sublingual nitroglycerin. Hence given ongoing symptoms, markedly reduced exercise capacity, it is felt that proceeding with coronary angiography with eye towards revascularization of circumflex and also RCA was indicated.  Angiographic data:  Right coronary artery: it's a dominant vessel, is occluded in the midsegment, faint ipsilateral collateral filling of the distal vessel is evident. Total occlusion occurs just proximal to the bifurcation of PLV and PDA branches. The PDA and PL branches are collateralized by the left system.   Left main coronary artery: Normal. No stenosis.  LAD: Large.Krystal Clark  origin to a moderate sized diagonal 1, D2 Which have mild ostial disease. The LAD itself as mild noncritical coronary artery disease in the proximal and midsegment, mild calcification is also evident. Distal LAD near the apex has a eccentric 80-90% stenosis.   Circumflex coronary artery: It is codominant with the right coronary artery. It s a very large vessel. Proximal circumflex has tandem 90-95% stenosis followed by ectasia in both poststenotic areas. This is followed by origin of a large OM1 with ostial 80% stenosis. OM2 is small with 40% stenosis. At the bifurcation of circumflex and OM1 there is a 70-80% stenosis followed by a distal circumflex which has a 90% stenosis.  Interventional data: Successful Stenting of Proximal and distal circumflex with 4 overlapping stents. 3.0x12, 3.0x16 sandwiched proximal circumflex, mid 2.5x18 and distal 2.5x16 mm DES. The OM1 branch was angioscored with a angiosculpt 2.5 x 10 mm balloon, performed to protect closure after stenting.  Successful Stenting of CTO  RCA. Mid to distal RCA 3.0x38 and proximal 3.0x28 mm DES.  Procedural data: Under sterile precautions using a 6 French right femoral arterial access, a 6 [place] XB 3.5 guide catheter was utilized to engage the left main coronary artery. Using Angiomax for", maintaining ACT greater than 200,  I utilized a BMW guidewire to cross into the circumflex and into the OM 1 branch of the circumflex coronary artery. This was followed by scoring the lesion with a 2.5 x 10 mm Angiosculpt. The stenosis was reduced to less than 20-30%, however there was recoil at the end of the case, but inconsequential. Multiple scoring was performed at 3 atmospheric pressure x3 for 30 seconds each followed by 6 and 10 atmospheric pressure for 30 seconds each. There was no evidence of  dissection and TIMI-3 flow was maintained. The same balloon was utilized to perform scoring proximal circumflex coronary artery at 10 and 40 Naprosyn pressure  for 50 and 60 seconds each.  The same BMW guidewire was advanced next into the distal circumflex coronary artery. I tried to use the same balloon across the distal circumflex stenosis, but I was unable to cross the stenosis due to high-grade nature of the stenosis.  Then I utilized a 2.5 x 10 mm Empira balloon and balloon angioplasty was performed at 10 atmospheric pressure for 50 seconds. There was dissection evident, however I decided to stent this. I then advanced a 2.5 x 16 mm Promus DES into the distal circumflex and deployed at 10 atmospheric pressure for 50 seconds. This was followed by stenting the proximal circumflex coronary artery with a 3.0 x 12 mm Promus, however in spite of having measured the maximal circumflex with the previously used 2.5 x 16 mm stent balloon, the stent was undersized in length. I deployed the stent in the proximal to midsegment of the circumflex coronary artery with the hopes of stenting the ostium of the circumflex karate with a short stent. However after deployment of the stent, I realized that both the distal and and the proximal and had missed the landing zone, hence he decided to sandwiched this area with a longer 3.0 x 16 mm Promus Premier DES instead of using too short stent the ostium and distal edge of the stent. The stent was deployed at 40 Naprosyn pressure for 43 seconds and gently pulled the same stent balloon into the left main and a second inflation at 10 atmospheric pressure for 20 seconds was performed to smooth the edges. Excellent result was evident with excellent coverage of the ostium of the circumflex coronary artery. Having performed this, realized that the midsegment of the circumflex carotid stenosis was much more severe, hence decided to stent this segment. I initially used a 2.5x16 mm stent which was short hence I did not deploy this stent. I exchanged this to a 2.5 x 18 mm Promus Premier DES at 16 atmospheric pressure for 60 seconds followed by a  second inflation at cannot percent pressure for 24 seconds overlapping with the previously placed distal stent.  I then utilized a 3.25 x 12 mmNC Euphora and balloon angioplasty of the entire stented segment of the circumflex coronary artery including the ostium, made and mid to distal segment was performed except I left the distal stent edge as the vessel was much smaller. Inflations of 12 atmospheric pressure was performed for 30 seconds each at 12 atmospheric pressure. The ostium of the circumflex coronary artery was dilated at 16 atmospheric pressure for 40 seconds and into the left main at 40 Naprosyn pressure for 40 seconds. Excellent result was evident with TIMI-3 flow. There was recoil of the OM 1 stenosis, however this was left alone as there was TIMI-3 flow. There was also type I dissection to the small OM 2 however TIMI-3 flow was maintained, hence the lesions were left alone. Having had excellent results, I then proceeded to intervene on the CTO RCA.  Patient also was very comfortable and wanted to proceed with angioplasty at this stage.  I exchanged the XB 3.5 guide catheter, to a 6 [place] FR-4 guide catheter, due to damping exchanged to a 6 [place] with sideholes. I then tried to advance the BMW guidewire into the RCA, however at the CTO, the wire would not cross. I tried to use previously used  Empira 2.5 x 10 mm balloon, however it would not cross the proximal circumflex coronary artery. Hence a 1.25 x 6 mm sprinter Legend balloon was utilized for backup, exchanged the BMW wire to a miracle Brothers  6 g heavy weight wire, and then with great amount of difficulty, the sprinter Legend balloon was then gently advanced to the site of greatest stenosis and angioplasty was performed at 12 atmospheric pressure for 30 seconds and 50 seconds. Having performed this angiography revealed that I was in the true lumen of the vessel in the distal and of the guidewire. Then I utilized a 2.0 x 6 mm sprinter  Legend balloon and multiple balloon inflations throughout the proximal, mid and also distal RCA which had a 70-80% stenosis was performed. This was performed at 14 atmospheric pressure from 20-50 seconds, a total inflations of 7 was performed. I then tried to advance a 3.0 x 38 mm Promus, the stent would not cross the proximal CTO lesion. Hence a 2.5 x 20 mm sprinter Legend balloon was utilized and balloon inflation was performed at region for 40 atmospheric pressure for 45 seconds. The proximal RCA was also dilated with the same balloon at 14 atmospheric pressure for 40 seconds. With this I was able to easily cross the stent to the distal RCA and the stent was deployed at 8 atmospheric pressure for 50 seconds, the same stent balloon was gently withdrawn into the stent to keep the distal edge away from the balloon, a 16 atmospheric inflation was performed and deployed for 30 seconds. Excellent TIMI-3 flow was maintained without any dissection. Then I proceeded with implantation of the proximal RCA with a 3.0 x 28 mm Promus Premier DES. While I was trying to see if I could Lantus into the ostium of the RCA, while manipulating the guide, the entire guidewire, guide catheter was engaged and a loss of the wire position. However I decannulated the RCA and then exchanged the miracle Brother the wire to a cougar wire, and I was able to get back into the true lumen. This was followed by stenting of the mid and proximal segment of the RCA with a 3.0 x 28 mm Promus Premier DES at 16 atmospheric pressure for 50 seconds. The same stent balloon was advanced distally to overlap with the previously placed and and the whole entire stented segment was angioplastied and 20 atmospheric pressure for 30 seconds each x2.  The ostium of the RCA had mild haziness, however there was no evidence of dissection, TIMI-3 flow was maintained, hence I did not feel that [facility] needed to be stented. Patient remained stable without any chest pain.  Hence I felt the procedure was successful. Right femoral arteriogram was performed, the arterial access was very close to the profunda bifurcation, hence it was felt that manual pressure holding is most appropriate. A total of 430 cc of contrast was utilized for interventional procedure. Postprocedure EKG demonstrated normal sinus rhythm.

## 2013-08-19 LAB — BASIC METABOLIC PANEL
BUN: 13 mg/dL (ref 6–23)
CO2: 24 mEq/L (ref 19–32)
Calcium: 8.9 mg/dL (ref 8.4–10.5)
Chloride: 105 mEq/L (ref 96–112)
Creatinine, Ser: 0.83 mg/dL (ref 0.50–1.10)
GFR calc Af Amer: 81 mL/min — ABNORMAL LOW (ref >90)
GFR calc non Af Amer: 70 mL/min — ABNORMAL LOW (ref >90)
Glucose, Bld: 100 mg/dL — ABNORMAL HIGH (ref 70–99)
Potassium: 4.2 mEq/L (ref 3.7–5.3)
Sodium: 140 mEq/L (ref 137–147)

## 2013-08-19 MED ORDER — TICAGRELOR 90 MG PO TABS: 90.0000 mg | ORAL_TABLET | Freq: Two times a day (BID) | ORAL | Status: DC

## 2013-08-19 MED ORDER — ASPIRIN 81 MG PO CHEW: 81.0000 mg | CHEWABLE_TABLET | Freq: Every day | ORAL | Status: DC

## 2013-08-19 NOTE — Discharge Summary (Signed)
Physician Discharge Summary  Patient ID: Carolyn Rowland MRN: [id number] DOB/AGE: 07/07/1940 73 y.o.  Admit date: 08/17/2013 Discharge date: 08/19/2013  Primary Discharge Diagnosis  1. 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 Status post PTCA of the circumflex coronary artery and right coronary artery  Secondary Discharge Diagnosis 3. Hypertension  4. Hyperlipidemia  Significant Diagnostic Studies: 08/17/2013: Angiographic data:  Right coronary artery: it's a dominant vessel, is occluded in the midsegment, faint ipsilateral collateral filling of the distal vessel is evident. Total occlusion occurs just proximal to the bifurcation of PLV and PDA branches. The PDA and PL branches are collateralized by the left system.  Left main coronary artery: Normal. No stenosis.  LAD: Large.Krystal Clark origin to a moderate sized diagonal 1, D2 Which have mild ostial disease. The LAD itself as mild noncritical coronary artery disease in the proximal and midsegment, mild calcification is also evident. Distal LAD near the apex has a eccentric 80-90% stenosis.  Circumflex coronary artery: It is codominant with the right coronary artery. It s a very large vessel. Proximal circumflex has tandem 90-95% stenosis followed by ectasia in both poststenotic areas. This is followed by origin of a large OM1 with ostial 80% stenosis. OM2 is small with 40% stenosis. At the bifurcation of circumflex and OM1 there is a 70-80% stenosis followed by a distal circumflex which has a 90% stenosis.   Interventional data: Successful Stenting of Proximal and distal circumflex with 4 overlapping stents. 3.0x12, 3.0x16 sandwiched proximal circumflex, mid 2.5x18 and distal 2.5x16 mm DES. The OM1 branch was angioscored with a angiosculpt 2.5 x 10 mm balloon, performed to protect side branch closure after stenting.  Successful Stenting of CTO RCA. Mid to distal RCA 3.0x38 and proximal 3.0x28 mm DES.   Hospital Course: Patient is a  fairly active 73 year old African American female who undergone coronary angiography on 08/07/2013 for markedly abnormal stress test, shortness of breath. Patient was able to walk only for 1.5 minutes on the treadmill with marked dyspnea, leading to [facility] which revealed severe inferior and lateral wall ischemia. Coronary angiography 08/07/2013  had revealed CTO of a large RCA faintly collateralized to the left system, and very complex high-grade stenosis of the proximal and distal circumflex coronary artery. In spite of aggressive medical therapy due to ongoing symptoms of dyspnea, also on her office visit patient stated that she's been having burning sensations just with activity, that she had not previously mentioned, was thinking that this is related to GERD, and this chest pain was relieved with sublingual nitroglycerin. Hence given ongoing symptoms, markedly reduced exercise capacity, it is felt that proceeding with coronary angiography with eye towards revascularization of circumflex and also RCA was indicated.  She was scheduled on elective fashion for coronary angiography on 08/17/2013, after successful procedure, due to complexity of the procedure, multiple stents, contrast use, patient was kept for 48 hours in the hospital, ambulated in the hallway without any chest pain. Felt stable for discharge. No hematoma at the right groin arterial access site. Patient had no chest pain or shortness of breath post procedure.  Recommendations on discharge: I will continue aspirin 81 mg by mouth daily along with BRILINTA 90 mg by mouth twice a day for at least one year, then consider aspirin and Plavix long-term given the complexity of her coronary arteries and multiple stents.  Discharge Exam: Blood pressure 125/74, pulse 81, temperature 99.1 F (37.3 C), temperature source Oral, resp. rate 18, height 5\' 6"  (1.676 m), weight  96.4 kg (212 lb 8.4 oz), SpO2 95.00%.   General appearance: alert,  cooperative, appears stated age, no distress and mildly obese  Eyes: negative findings: lids and lashes normal  Neck: no adenopathy, no carotid bruit, no JVD, supple, symmetrical, trachea midline and thyroid not enlarged, symmetric, no tenderness/mass/nodules  Neck: JVP - normal, carotids 2+= without bruits  Resp: clear to auscultation bilaterally  Chest wall: no tenderness  Cardio: S1, S2 normal, 2/6 systolic ejection murmur heard in the aortic area conducted to the carotids.  GI: soft, non-tender; bowel sounds normal; no masses, no organomegaly  Extremities: extremities normal, atraumatic, no cyanosis or edema and Right groin site without any hematoma or bruising. Labs:   Lab Results  Component Value Date   WBC 9.1 08/18/2013   HGB 12.5 08/18/2013   HCT 34.7* 08/18/2013   MCV 79.0 08/18/2013   PLT 188 08/18/2013    Recent Labs Lab 08/19/13 0605  NA 140  K 4.2  CL 105  CO2 24  BUN 13  CREATININE 0.83  CALCIUM 8.9  GLUCOSE 100*    EKG: normal EKG, normal sinus rhythm, unchanged from previous tracings.   FOLLOW UP PLANS AND APPOINTMENTS Discharge Orders   Future Orders Complete By Expires   Amb Referral to Cardiac Rehabilitation  As directed        Medication List    STOP taking these medications       aspirin 325 MG tablet  Replaced by:  aspirin 81 MG chewable tablet     prasugrel 10 MG Tabs tablet  Commonly known as:  EFFIENT      TAKE these medications       aspirin 81 MG chewable tablet  Chew 1 tablet (81 mg total) by mouth daily.     atorvastatin 80 MG tablet  Commonly known as:  LIPITOR  Take 1 tablet (80 mg total) by mouth daily.     CALCIUM 600 + D PO  Take 1 tablet by mouth daily.     isosorbide mononitrate 60 MG 24 hr tablet  Commonly known as:  IMDUR  Take 1 tablet (60 mg total) by mouth daily.     metoprolol succinate 50 MG 24 hr tablet  Commonly known as:  TOPROL-XL  Take 50 mg by mouth daily. Take with or immediately following a meal.      nitroGLYCERIN 0.4 MG SL tablet  Commonly known as:  NITROSTAT  Place 1 tablet (0.4 mg total) under the tongue every 5 (five) minutes as needed for chest pain.     omeprazole 20 MG capsule  Commonly known as:  PRILOSEC  Take 20 mg by mouth every other day.     spironolactone 25 MG tablet  Commonly known as:  ALDACTONE  Take 25 mg by mouth daily.     Ticagrelor 90 MG Tabs tablet  Commonly known as:  BRILINTA  Take 1 tablet (90 mg total) by mouth 2 (two) times daily.     traMADol 50 MG tablet  Commonly known as:  ULTRAM  Take 50-100 mg by mouth every 8 (eight) hours as needed for moderate pain.          Laverda Page, MD 08/19/2013, 10:35 AM  Pager: [phone] Office: [phone] If no answer: [phone]

## 2013-08-19 NOTE — Discharge Instructions (Signed)
Angina Pectoris Angina pectoris, often just called angina, is extreme discomfort in your chest, neck, or arm caused by a lack of blood in the middle and thickest layer of your heart wall (myocardium). It may feel like tightness or heavy pressure. It may feel like a crushing or squeezing pain. Some people say it feels like gas or indigestion. It may go down your shoulders, back, and arms. Some people may have symptoms other than pain. These symptoms include fatigue, shortness of breath, cold sweats, or nausea. There are four different types of angina:  Stable angina Stable angina usually occurs in episodes of predictable frequency and duration. It usually is brought on by physical activity, emotional stress, or excitement. These are all times when the myocardium needs more oxygen. Stable angina usually lasts a few minutes and often is relieved by taking a medicine that can be taken under your tongue (sublingually). The medicine is called nitroglycerin. Stable angina is caused by a buildup of plaque inside the arteries, which restricts blood flow to the heart muscle (atherosclerosis).  Unstable angina Unstable angina can occur even when your body experiences little or no physical exertion. It can occur during sleep. It can also occur at rest. It can suddenly increase in severity or frequency. It might not be relieved by sublingual nitroglycerin. It can last up to 30 minutes. The most common cause of unstable angina is a blood clot that has developed on the top of plaque buildup inside a coronary artery. It can lead to a heart attack if the blood clot completely blocks the artery.  Microvascular angina This type of angina is caused by a disorder of tiny blood vessels called arterioles. Microvascular angina is more common in women. The pain may be more severe and last longer than other types of angina pectoris.  Prinzmetal or variant angina This type of angina pectoris usually occurs when your body experiences  little or no physical exertion. It especially occurs in the early morning hours. It is caused by a spasm of your coronary artery. HOME CARE INSTRUCTIONS   Only take over-the-counter and prescription medicines as directed by your caregiver.  Stay active or increase your exercise as directed by your caregiver.  Limit strenuous activity as directed by your caregiver.  Limit heavy lifting as directed by your caregiver.  Maintain a healthy weight.  Learn about and eat heart-healthy foods.  Do not smoke. SEEK IMMEDIATE MEDICAL CARE IF:  You experience the following symptoms:  Chest, neck, deep shoulder, or arm pain or discomfort that lasts more than a few minutes.  Chest, neck, deep shoulder, or arm pain or discomfort that goes away and comes back, repeatedly.  Heavy sweating with discomfort, without a noticeable cause.  Shortness of breath or difficulty breathing.  Angina that does not get better after a few minutes of rest or after taking sublingual nitroglycerin. These can all be symptoms of a heart attack, which is a medical emergency! Get medical help at once. Call your local emergency service (911 in [place] immediately. Do not  drive yourself to the hospital and do not  wait to for your symptoms to go away. MAKE SURE YOU:  Understand these instructions.  Will watch your condition.  Will get help right away if you are not doing well or get worse. Document Released: 06/21/2005 Document Revised: 06/07/2012 Document Reviewed: 03/30/2012 [facility] Patient Information 2014 [place], [place].  Angina Pectoris Angina pectoris, often just called angina, is extreme discomfort in your chest, neck, or arm caused  by a lack of blood in the middle and thickest layer of your heart wall (myocardium). It may feel like tightness or heavy pressure. It may feel like a crushing or squeezing pain. Some people say it feels like gas or indigestion. It may go down your shoulders, back, and arms. Some  people may have symptoms other than pain. These symptoms include fatigue, shortness of breath, cold sweats, or nausea. There are four different types of angina:  Stable angina Stable angina usually occurs in episodes of predictable frequency and duration. It usually is brought on by physical activity, emotional stress, or excitement. These are all times when the myocardium needs more oxygen. Stable angina usually lasts a few minutes and often is relieved by taking a medicine that can be taken under your tongue (sublingually). The medicine is called nitroglycerin. Stable angina is caused by a buildup of plaque inside the arteries, which restricts blood flow to the heart muscle (atherosclerosis).  Unstable angina Unstable angina can occur even when your body experiences little or no physical exertion. It can occur during sleep. It can also occur at rest. It can suddenly increase in severity or frequency. It might not be relieved by sublingual nitroglycerin. It can last up to 30 minutes. The most common cause of unstable angina is a blood clot that has developed on the top of plaque buildup inside a coronary artery. It can lead to a heart attack if the blood clot completely blocks the artery.  Microvascular angina This type of angina is caused by a disorder of tiny blood vessels called arterioles. Microvascular angina is more common in women. The pain may be more severe and last longer than other types of angina pectoris.  Prinzmetal or variant angina This type of angina pectoris usually occurs when your body experiences little or no physical exertion. It especially occurs in the early morning hours. It is caused by a spasm of your coronary artery. HOME CARE INSTRUCTIONS   Only take over-the-counter and prescription medicines as directed by your caregiver.  Stay active or increase your exercise as directed by your caregiver.  Limit strenuous activity as directed by your caregiver.  Limit heavy lifting  as directed by your caregiver.  Maintain a healthy weight.  Learn about and eat heart-healthy foods.  Do not smoke. SEEK IMMEDIATE MEDICAL CARE IF:  You experience the following symptoms:  Chest, neck, deep shoulder, or arm pain or discomfort that lasts more than a few minutes.  Chest, neck, deep shoulder, or arm pain or discomfort that goes away and comes back, repeatedly.  Heavy sweating with discomfort, without a noticeable cause.  Shortness of breath or difficulty breathing.  Angina that does not get better after a few minutes of rest or after taking sublingual nitroglycerin. These can all be symptoms of a heart attack, which is a medical emergency! Get medical help at once. Call your local emergency service (911 in [place] immediately. Do not  drive yourself to the hospital and do not  wait to for your symptoms to go away. MAKE SURE YOU:  Understand these instructions.  Will watch your condition.  Will get help right away if you are not doing well or get worse. Document Released: 06/21/2005 Document Revised: 06/07/2012 Document Reviewed: 03/30/2012 [facility] Patient Information 2014 [place], [place].

## 2013-08-19 NOTE — Progress Notes (Signed)
IV and tele monitor d/c at this time; pt to d/c home with husband; pt sitting on side of bed dressing at this time awaiting arrival of husband; will cont. To monitor.

## 2013-08-19 NOTE — Progress Notes (Signed)
Pt given MOM at this time per pt request; will cont. To monitor.

## 2013-08-20 MED FILL — Sodium Chloride IV Soln 0.9%: INTRAVENOUS | Qty: 50 | Status: AC

## 2013-10-23 ENCOUNTER — Telehealth (HOSPITAL_COMMUNITY): Payer: Self-pay | Admitting: Cardiac Rehabilitation

## 2013-10-23 NOTE — Telephone Encounter (Addendum)
pc to pt to perform nursing interview for cardiac rehab.  During interview, pt reports she has groin pain at cath site, denies redness, swelling, drainage. Pt also states she has been taking brilinta 2 tabs once daily instead of BID.  Pt instructed of importance of taking brilinta twice daily.  Scheduled appointment with Dr. Einar Gip 10/24/13 @11 :15 for groin assessment and med reconciliation.  Pt verbalized understanding of appointment date and time and medication instructions.   Pt also confirmed date, time and location of cardiac rehab orientation Thursday 10/25/13 @8 :00am.

## 2013-10-25 ENCOUNTER — Inpatient Hospital Stay (HOSPITAL_COMMUNITY): Admission: RE | Admit: 2013-10-25 | Payer: Medicare Other | Source: Ambulatory Visit

## 2013-10-29 ENCOUNTER — Encounter (HOSPITAL_COMMUNITY): Payer: Medicare HMO

## 2013-10-31 ENCOUNTER — Encounter (HOSPITAL_COMMUNITY): Payer: Medicare HMO

## 2013-11-01 ENCOUNTER — Encounter (HOSPITAL_COMMUNITY)
Admission: RE | Admit: 2013-11-01 | Discharge: 2013-11-01 | Disposition: A | Discharge status: Home / Self Care | Payer: Medicare HMO | Source: Ambulatory Visit | Attending: Cardiology | Admitting: Cardiology

## 2013-11-01 NOTE — Progress Notes (Signed)
Cardiac Rehab Medication Review by a Pharmacist  Does the patient  feel that his/her medications are working for him/her?  yes  Has the patient been experiencing any side effects to the medications prescribed?  No She feels shortness of breath since starting these medications, thought to be a side effect of Brilinta  Does the patient measure his/her own blood pressure or blood glucose at home?  no   Does the patient have any problems obtaining medications due to transportation or finances?   yes  Understanding of regimen: fair Understanding of indications: fair Potential of compliance: fair    Carolyn Rowland 11/01/2013 8:49 AM

## 2013-11-02 ENCOUNTER — Encounter (HOSPITAL_COMMUNITY): Payer: Medicare Other

## 2013-11-05 ENCOUNTER — Encounter (HOSPITAL_COMMUNITY): Payer: Self-pay

## 2013-11-05 ENCOUNTER — Encounter (HOSPITAL_COMMUNITY)
Admission: RE | Admit: 2013-11-05 | Discharge: 2013-11-05 | Disposition: A | Discharge status: Home / Self Care | Payer: Medicare Other | Source: Ambulatory Visit | Attending: Cardiology | Admitting: Cardiology

## 2013-11-05 DIAGNOSIS — Z9861 Coronary angioplasty status: Secondary | ICD-10-CM | POA: Insufficient documentation

## 2013-11-05 NOTE — Progress Notes (Signed)
Pt started cardiac rehab today.  Pt tolerated light exercise without difficulty.  VSS, telemetry-NSR, asymptomatic.  However pt reports episode of chest tightness yesterday while working and under emotional stress.  Pt reports tightness relieved with rest and NTG SLx1.  Pt admits she has not taken isosorbide 60mg  for several weeks, she was unsure if she should continue taking so she never requested refill from pharmacy.  Phone call to Dr. Irven Shelling nurse.  Pt is scheduled today for echo and stress test 11/07/12 with Dr. Einar Gip.  These are previously scheduled appointments.  Report faxed to Dr. Einar Gip for review and to clarify isosorbide order.  Pt instructed to contact Dr. Irven Shelling office if symptoms return or worsen and present to ED for severe, unrelieved symptoms.  Understanding verbalized.  Pt oriented to exercise equipment and routine.  Understanding verbalized.  PSYCHOSOCIAL ASSESSMENT  Pt psychosocial assessment reveals no barriers to rehab participation. PHQ-0.   Pt quality of life is slightly altered by her physical constraints which limits her ability to perform tasks as prior to her illness. Her family is having difficulty adjusting to the medically necessary changes in her roles and responsibilities.    Pt exhibits positive coping skills and has strong faith base.  Pt offered counseling with Jeanella Craze, chaplain to discuss stress related to altered family function.  Pt reports she prefers to speak to her pastor.  Offered emotional support and reassurance.  Will continue to monitor.

## 2013-11-07 ENCOUNTER — Encounter (HOSPITAL_COMMUNITY)
Admission: RE | Admit: 2013-11-07 | Discharge: 2013-11-07 | Disposition: A | Discharge status: Home / Self Care | Payer: Medicare Other | Source: Ambulatory Visit | Attending: Cardiology | Admitting: Cardiology

## 2013-11-08 ENCOUNTER — Ambulatory Visit (HOSPITAL_COMMUNITY): Payer: Medicare Other

## 2013-11-09 ENCOUNTER — Encounter (HOSPITAL_COMMUNITY): Payer: Medicare Other

## 2013-11-09 ENCOUNTER — Telehealth (HOSPITAL_COMMUNITY): Payer: Self-pay | Admitting: Cardiac Rehabilitation

## 2013-11-09 NOTE — Telephone Encounter (Signed)
pc to pt to assess reason for continued absence from cardiac rehab.lmom for pt to return call to advise  pt ok to return to cardiac rehab after  Stress test reviewed from Dr. Einar Gip with clearance to return to cardiac rehab.

## 2013-11-12 ENCOUNTER — Encounter (HOSPITAL_COMMUNITY)
Admission: RE | Admit: 2013-11-12 | Discharge: 2013-11-12 | Disposition: A | Discharge status: Home / Self Care | Payer: Medicare Other | Source: Ambulatory Visit | Attending: Cardiology | Admitting: Cardiology

## 2013-11-14 ENCOUNTER — Encounter (HOSPITAL_COMMUNITY): Payer: Medicare Other

## 2013-11-14 ENCOUNTER — Telehealth (HOSPITAL_COMMUNITY): Payer: Self-pay | Admitting: Cardiac Rehabilitation

## 2013-11-14 NOTE — Telephone Encounter (Signed)
pc received from pt reporting she was absent from cardiac rehab due to poison ivy.  Pt denies open blisters. Pt instructed to continue to use calamine lotion to dry rash.  Pt plans to return to cardiac rehab on Friday unless open weeping lesions are present.

## 2013-11-16 ENCOUNTER — Encounter (HOSPITAL_COMMUNITY): Payer: Medicare Other

## 2013-11-19 ENCOUNTER — Encounter (HOSPITAL_COMMUNITY): Payer: Medicare Other

## 2013-11-21 ENCOUNTER — Encounter (HOSPITAL_COMMUNITY): Admission: RE | Admit: 2013-11-21 | Payer: Medicare Other | Source: Ambulatory Visit

## 2013-11-23 ENCOUNTER — Encounter (HOSPITAL_COMMUNITY): Payer: Medicare Other

## 2013-11-28 ENCOUNTER — Encounter (HOSPITAL_COMMUNITY): Payer: Medicare Other

## 2013-11-28 ENCOUNTER — Telehealth (HOSPITAL_COMMUNITY): Payer: Self-pay | Admitting: Cardiac Rehabilitation

## 2013-11-28 NOTE — Telephone Encounter (Signed)
pc to pt to assess reason for continued absence from cardiac rehab. Pt reports her previous rash and cold symptoms have resolved. She was evaluated by her PCP. Pt reports she overslept this am, however plans to return to rehab on Friday 11/30/13.

## 2013-11-30 ENCOUNTER — Encounter (HOSPITAL_COMMUNITY): Payer: Medicare Other

## 2013-12-03 ENCOUNTER — Encounter (HOSPITAL_COMMUNITY): Payer: Medicare Other

## 2013-12-05 ENCOUNTER — Encounter (HOSPITAL_COMMUNITY)
Admission: RE | Admit: 2013-12-05 | Discharge: 2013-12-05 | Disposition: A | Discharge status: Home / Self Care | Payer: Medicare Other | Source: Ambulatory Visit | Attending: Cardiology | Admitting: Cardiology

## 2013-12-05 DIAGNOSIS — Z9861 Coronary angioplasty status: Secondary | ICD-10-CM | POA: Insufficient documentation

## 2013-12-07 ENCOUNTER — Encounter (HOSPITAL_COMMUNITY): Payer: Medicare Other

## 2013-12-10 ENCOUNTER — Encounter (HOSPITAL_COMMUNITY)
Admission: RE | Admit: 2013-12-10 | Discharge: 2013-12-10 | Disposition: A | Discharge status: Home / Self Care | Payer: Medicare Other | Source: Ambulatory Visit | Attending: Cardiology | Admitting: Cardiology

## 2013-12-12 ENCOUNTER — Encounter (HOSPITAL_COMMUNITY)
Admission: RE | Admit: 2013-12-12 | Discharge: 2013-12-12 | Disposition: A | Discharge status: Home / Self Care | Payer: Medicare Other | Source: Ambulatory Visit | Attending: Cardiology | Admitting: Cardiology

## 2013-12-12 NOTE — Progress Notes (Signed)
Carolyn Rowland 73 y.o. female Nutrition Note Spoke with pt.  Nutrition Survey reviewed with pt. Pt is following Step 1 of the Therapeutic Lifestyle Changes diet. Pt wants to lose wt. Pt wt today reportedly 96.2 kg, which is down 1.1 kg since pt started rehab. Pt has not been actively trying to lose wt. Wt loss tips reviewed briefly. Pt recently quit using tobacco products, which pt states "it hasn't been a problem quitting. I thought it would be harder." Per nutrition screen, pt reports financial difficulty buying food. Pt receives $15/mo in food stamps and goes to food banks if needed. Pt also c/o constipation, which pt states is now resolved "since I started drinking more water. Pt expressed understanding of the information reviewed. Pt aware of nutrition education classes offered and is unable to attend nutrition classes.  Nutrition Diagnosis   Food-and nutrition-related knowledge deficit related to lack of exposure to information as related to diagnosis of: ? 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   Pt to attend the Portion Distortion class   Pt given handouts for: ? Nutrition I class ? Nutrition II class   Continue client-centered nutrition education by RD, as part of interdisciplinary care.  Goal(s)   Pt to identify and limit food sources of saturated fat, trans fat, and cholesterol   Pt to identify food quantities necessary to achieve: ? wt loss to a goal wt of 190-208 lb (86.4-94.6 kg) at graduation from cardiac rehab.   Monitor and Evaluate progress toward nutrition goal with team. Nutrition Risk:  Low   Derek Mound, M.Ed, RD, LDN, CDE 12/12/2013 10:56 AM

## 2013-12-14 ENCOUNTER — Encounter (HOSPITAL_COMMUNITY): Payer: Medicare Other

## 2013-12-17 ENCOUNTER — Encounter (HOSPITAL_COMMUNITY): Payer: Medicare Other

## 2013-12-19 ENCOUNTER — Encounter (HOSPITAL_COMMUNITY)
Admission: RE | Admit: 2013-12-19 | Discharge: 2013-12-19 | Disposition: A | Discharge status: Home / Self Care | Payer: Medicare Other | Source: Ambulatory Visit | Attending: Cardiology | Admitting: Cardiology

## 2013-12-19 NOTE — Progress Notes (Signed)
I have reviewed home exercise with Carolyn Rowland. The patient was advised to walk 2-4 days per week outside of CRP II for 10 minutes, 3 times per day, progressing to 15 minutes, 2 times per day until she can walk 30 minutes continuously.  Pt will also complete one additional day of hand weights outside of CRP II.    Pt stated that this information was overwhelming and that is a lot to comprehend at this time.  Ensured pt to review home exercise packet and follow up if she has any questions.  I will continue to review this information with the pt to ensure pt is safely and properly exercising at home.  Progression of exercise prescription was discussed.  Reviewed THR, pulse, RPE, sign and symptoms, NTG use and when to call 911 or MD.  Pt voiced understanding. 0815  Archie Endo, MS, ACSM RCEP 12/19/2013 1:14 PM

## 2013-12-21 ENCOUNTER — Encounter (HOSPITAL_COMMUNITY)
Admission: RE | Admit: 2013-12-21 | Discharge: 2013-12-21 | Disposition: A | Discharge status: Home / Self Care | Payer: Medicare Other | Source: Ambulatory Visit | Attending: Cardiology | Admitting: Cardiology

## 2013-12-24 ENCOUNTER — Encounter (HOSPITAL_COMMUNITY)
Admission: RE | Admit: 2013-12-24 | Discharge: 2013-12-24 | Disposition: A | Discharge status: Home / Self Care | Payer: Medicare Other | Source: Ambulatory Visit | Attending: Cardiology | Admitting: Cardiology

## 2013-12-26 ENCOUNTER — Encounter (HOSPITAL_COMMUNITY): Payer: Medicare Other

## 2013-12-28 ENCOUNTER — Encounter (HOSPITAL_COMMUNITY): Payer: Medicare Other

## 2013-12-31 ENCOUNTER — Encounter (HOSPITAL_COMMUNITY)
Admission: RE | Admit: 2013-12-31 | Discharge: 2013-12-31 | Disposition: A | Discharge status: Home / Self Care | Payer: Medicare Other | Source: Ambulatory Visit | Attending: Cardiology | Admitting: Cardiology

## 2014-01-02 ENCOUNTER — Encounter (HOSPITAL_COMMUNITY): Payer: Medicare Other

## 2014-01-07 ENCOUNTER — Encounter (HOSPITAL_COMMUNITY): Payer: Medicare Other

## 2014-01-09 ENCOUNTER — Telehealth (HOSPITAL_COMMUNITY): Payer: Self-pay | Admitting: Cardiac Rehabilitation

## 2014-01-09 ENCOUNTER — Encounter (HOSPITAL_COMMUNITY): Payer: Medicare Other

## 2014-01-09 NOTE — Telephone Encounter (Signed)
pc to pt to assess absence from cardiac rehab.  Pt states she overslept.  She states she plans to attend class on Friday.  Pt made aware she is scheduled to attend every Monday, Wednesday and Friday. Understanding verbalized

## 2014-01-11 ENCOUNTER — Encounter (HOSPITAL_COMMUNITY)
Admission: RE | Admit: 2014-01-11 | Discharge: 2014-01-11 | Disposition: A | Discharge status: Home / Self Care | Payer: Medicare Other | Source: Ambulatory Visit | Attending: Cardiology | Admitting: Cardiology

## 2014-01-11 DIAGNOSIS — Z9861 Coronary angioplasty status: Secondary | ICD-10-CM | POA: Diagnosis not present

## 2014-01-14 ENCOUNTER — Encounter (HOSPITAL_COMMUNITY)
Admission: RE | Admit: 2014-01-14 | Discharge: 2014-01-14 | Disposition: A | Discharge status: Home / Self Care | Payer: Medicare Other | Source: Ambulatory Visit | Attending: Cardiology | Admitting: Cardiology

## 2014-01-14 DIAGNOSIS — Z9861 Coronary angioplasty status: Secondary | ICD-10-CM | POA: Diagnosis not present

## 2014-01-16 ENCOUNTER — Encounter (HOSPITAL_COMMUNITY)
Admission: RE | Admit: 2014-01-16 | Discharge: 2014-01-16 | Disposition: A | Discharge status: Home / Self Care | Payer: Medicare Other | Source: Ambulatory Visit | Attending: Cardiology | Admitting: Cardiology

## 2014-01-16 DIAGNOSIS — Z9861 Coronary angioplasty status: Secondary | ICD-10-CM | POA: Diagnosis not present

## 2014-01-18 ENCOUNTER — Encounter (HOSPITAL_COMMUNITY)
Admission: RE | Admit: 2014-01-18 | Discharge: 2014-01-18 | Disposition: A | Discharge status: Home / Self Care | Payer: Medicare Other | Source: Ambulatory Visit | Attending: Cardiology | Admitting: Cardiology

## 2014-01-18 DIAGNOSIS — Z9861 Coronary angioplasty status: Secondary | ICD-10-CM | POA: Diagnosis not present

## 2014-01-21 ENCOUNTER — Encounter (HOSPITAL_COMMUNITY)
Admission: RE | Admit: 2014-01-21 | Discharge: 2014-01-21 | Disposition: A | Discharge status: Home / Self Care | Payer: Medicare Other | Source: Ambulatory Visit | Attending: Cardiology | Admitting: Cardiology

## 2014-01-21 DIAGNOSIS — Z9861 Coronary angioplasty status: Secondary | ICD-10-CM | POA: Diagnosis not present

## 2014-01-23 ENCOUNTER — Encounter (HOSPITAL_COMMUNITY): Payer: Medicare Other

## 2014-01-25 ENCOUNTER — Encounter (HOSPITAL_COMMUNITY)
Admission: RE | Admit: 2014-01-25 | Discharge: 2014-01-25 | Disposition: A | Discharge status: Home / Self Care | Payer: Medicare Other | Source: Ambulatory Visit | Attending: Cardiology | Admitting: Cardiology

## 2014-01-25 DIAGNOSIS — Z9861 Coronary angioplasty status: Secondary | ICD-10-CM | POA: Diagnosis not present

## 2014-01-28 ENCOUNTER — Encounter (HOSPITAL_COMMUNITY)
Admission: RE | Admit: 2014-01-28 | Discharge: 2014-01-28 | Disposition: A | Discharge status: Home / Self Care | Payer: Medicare Other | Source: Ambulatory Visit | Attending: Cardiology | Admitting: Cardiology

## 2014-01-28 DIAGNOSIS — Z9861 Coronary angioplasty status: Secondary | ICD-10-CM | POA: Diagnosis not present

## 2014-01-29 ENCOUNTER — Other Ambulatory Visit: Payer: Self-pay | Admitting: Internal Medicine

## 2014-01-29 DIAGNOSIS — Z1231 Encounter for screening mammogram for malignant neoplasm of breast: Secondary | ICD-10-CM

## 2014-01-30 ENCOUNTER — Encounter (HOSPITAL_COMMUNITY): Payer: Medicare Other

## 2014-02-01 ENCOUNTER — Encounter (HOSPITAL_COMMUNITY): Payer: Medicare Other

## 2014-02-04 ENCOUNTER — Encounter (HOSPITAL_COMMUNITY): Payer: Medicare Other

## 2014-02-06 ENCOUNTER — Encounter (HOSPITAL_COMMUNITY): Payer: Medicare Other

## 2014-02-08 ENCOUNTER — Telehealth (HOSPITAL_COMMUNITY): Payer: Self-pay | Admitting: Cardiac Rehabilitation

## 2014-02-08 ENCOUNTER — Encounter (HOSPITAL_COMMUNITY): Payer: Medicare Other

## 2014-02-08 NOTE — Telephone Encounter (Signed)
pc to pt to assess reason for absence from cardiac rehab this week.  Pt reports she will return to rehab on Monday.  Pt states she has had "problem with my legs".  This has reported resolved.

## 2014-02-11 ENCOUNTER — Encounter (HOSPITAL_COMMUNITY)
Admission: RE | Admit: 2014-02-11 | Discharge: 2014-02-11 | Disposition: A | Discharge status: Home / Self Care | Payer: Medicare Other | Source: Ambulatory Visit | Attending: Cardiology | Admitting: Cardiology

## 2014-02-11 DIAGNOSIS — Z9861 Coronary angioplasty status: Secondary | ICD-10-CM | POA: Diagnosis not present

## 2014-02-13 ENCOUNTER — Encounter (HOSPITAL_COMMUNITY)
Admission: RE | Admit: 2014-02-13 | Discharge: 2014-02-13 | Disposition: A | Discharge status: Home / Self Care | Payer: Medicare Other | Source: Ambulatory Visit | Attending: Cardiology | Admitting: Cardiology

## 2014-02-13 DIAGNOSIS — Z9861 Coronary angioplasty status: Secondary | ICD-10-CM | POA: Diagnosis not present

## 2014-02-15 ENCOUNTER — Encounter (HOSPITAL_COMMUNITY): Payer: Medicare Other

## 2014-02-18 ENCOUNTER — Institutional Professional Consult (permissible substitution): Payer: Medicare Other | Admitting: Internal Medicine

## 2014-02-18 ENCOUNTER — Encounter (HOSPITAL_COMMUNITY)
Admission: RE | Admit: 2014-02-18 | Discharge: 2014-02-18 | Disposition: A | Discharge status: Home / Self Care | Payer: Medicare Other | Source: Ambulatory Visit | Attending: Cardiology | Admitting: Cardiology

## 2014-02-18 DIAGNOSIS — Z9861 Coronary angioplasty status: Secondary | ICD-10-CM | POA: Diagnosis not present

## 2014-02-20 ENCOUNTER — Encounter (HOSPITAL_COMMUNITY): Payer: Medicare Other

## 2014-02-20 ENCOUNTER — Ambulatory Visit
Admission: RE | Admit: 2014-02-20 | Discharge: 2014-02-20 | Disposition: A | Discharge status: Home / Self Care | Payer: Commercial Managed Care - HMO | Source: Ambulatory Visit | Attending: Internal Medicine | Admitting: Internal Medicine

## 2014-02-20 DIAGNOSIS — Z1231 Encounter for screening mammogram for malignant neoplasm of breast: Secondary | ICD-10-CM

## 2014-02-22 ENCOUNTER — Encounter (HOSPITAL_COMMUNITY)
Admission: RE | Admit: 2014-02-22 | Discharge: 2014-02-22 | Disposition: A | Discharge status: Home / Self Care | Payer: Medicare Other | Source: Ambulatory Visit | Attending: Cardiology | Admitting: Cardiology

## 2014-02-22 DIAGNOSIS — Z9861 Coronary angioplasty status: Secondary | ICD-10-CM | POA: Diagnosis not present

## 2014-02-25 ENCOUNTER — Encounter (HOSPITAL_COMMUNITY)
Admission: RE | Admit: 2014-02-25 | Discharge: 2014-02-25 | Disposition: A | Discharge status: Home / Self Care | Payer: Medicare Other | Source: Ambulatory Visit | Attending: Cardiology | Admitting: Cardiology

## 2014-02-25 DIAGNOSIS — Z9861 Coronary angioplasty status: Secondary | ICD-10-CM | POA: Diagnosis not present

## 2014-02-27 ENCOUNTER — Encounter (HOSPITAL_COMMUNITY)
Admission: RE | Admit: 2014-02-27 | Discharge: 2014-02-27 | Disposition: A | Discharge status: Home / Self Care | Payer: Medicare Other | Source: Ambulatory Visit | Attending: Cardiology | Admitting: Cardiology

## 2014-02-27 DIAGNOSIS — Z9861 Coronary angioplasty status: Secondary | ICD-10-CM | POA: Diagnosis not present

## 2014-03-01 ENCOUNTER — Encounter (HOSPITAL_COMMUNITY): Payer: Medicare Other

## 2014-03-04 ENCOUNTER — Encounter (HOSPITAL_COMMUNITY)
Admission: RE | Admit: 2014-03-04 | Discharge: 2014-03-04 | Disposition: A | Discharge status: Home / Self Care | Payer: Medicare Other | Source: Ambulatory Visit | Attending: Cardiology | Admitting: Cardiology

## 2014-03-04 DIAGNOSIS — Z9861 Coronary angioplasty status: Secondary | ICD-10-CM | POA: Diagnosis not present

## 2014-03-06 ENCOUNTER — Encounter (HOSPITAL_COMMUNITY): Payer: Medicare HMO

## 2014-03-08 ENCOUNTER — Encounter (HOSPITAL_COMMUNITY)
Admission: RE | Admit: 2014-03-08 | Discharge: 2014-03-08 | Disposition: A | Discharge status: Home / Self Care | Payer: Medicare HMO | Source: Ambulatory Visit | Attending: Cardiology | Admitting: Cardiology

## 2014-03-08 ENCOUNTER — Encounter (HOSPITAL_COMMUNITY): Payer: Self-pay

## 2014-03-08 DIAGNOSIS — Z9861 Coronary angioplasty status: Secondary | ICD-10-CM | POA: Insufficient documentation

## 2014-03-08 NOTE — Progress Notes (Signed)
Pt graduated from cardiac rehab program today with completion of 25 exercise sessions in Phase II. Pt attendance inconsistent.  Pt exercise tolerance improved although minimal improvement in functional ability.  Pt admits to inconsistency with home exercise.     Medication list reconciled. Repeat  PHQ9 score-1  .  Pt feels she has not met her rehab goals, specifically weight loss.     Pt plans to continue exercising on her own. Pt would like to begin an exercise group at her church to motivate herself and others to exercise.  Pt will need MD encouragement to continue making lifestyle modifications especially diet and exercise.  Pt should be congratulated for her continued smoking cessation.

## 2014-06-03 ENCOUNTER — Encounter (HOSPITAL_COMMUNITY): Payer: Self-pay | Admitting: *Deleted

## 2014-06-03 ENCOUNTER — Ambulatory Visit (HOSPITAL_COMMUNITY): Payer: Commercial Managed Care - HMO | Attending: Emergency Medicine

## 2014-06-03 ENCOUNTER — Emergency Department (INDEPENDENT_AMBULATORY_CARE_PROVIDER_SITE_OTHER)
Admission: EM | Admit: 2014-06-03 | Discharge: 2014-06-03 | Disposition: A | Discharge status: Home / Self Care | Payer: Commercial Managed Care - HMO | Source: Home / Self Care | Attending: Emergency Medicine | Admitting: Emergency Medicine

## 2014-06-03 DIAGNOSIS — M5430 Sciatica, unspecified side: Secondary | ICD-10-CM | POA: Insufficient documentation

## 2014-06-03 DIAGNOSIS — G8929 Other chronic pain: Secondary | ICD-10-CM | POA: Insufficient documentation

## 2014-06-03 DIAGNOSIS — M5431 Sciatica, right side: Secondary | ICD-10-CM

## 2014-06-03 DIAGNOSIS — M25551 Pain in right hip: Secondary | ICD-10-CM | POA: Diagnosis not present

## 2014-06-03 DIAGNOSIS — M25559 Pain in unspecified hip: Secondary | ICD-10-CM

## 2014-06-03 LAB — D-DIMER, QUANTITATIVE: D-Dimer, Quant: 0.57 ug/mL-FEU — ABNORMAL HIGH (ref 0.00–0.48)

## 2014-06-03 MED ORDER — GABAPENTIN 300 MG PO CAPS: ORAL_CAPSULE | ORAL | Status: DC

## 2014-06-03 MED ORDER — PREDNISONE 20 MG PO TABS: ORAL_TABLET | ORAL | Status: DC

## 2014-06-03 MED ORDER — TRAMADOL HCL 50 MG PO TABS: 100.0000 mg | ORAL_TABLET | Freq: Three times a day (TID) | ORAL | Status: DC | PRN

## 2014-06-03 NOTE — Discharge Instructions (Signed)
Do exercises twice daily followed by moist heat for 15 minutes. ° ° ° ° ° °Try to be as active as possible. ° °If no better in 2 weeks, follow up with orthopedist. ° ° °

## 2014-06-03 NOTE — ED Notes (Signed)
2 months of right hip pain radiating to the medial knee area without injury.  The pain is worse when standing, better with rest.

## 2014-06-03 NOTE — ED Provider Notes (Signed)
Chief Complaint    Leg Pain   History of Present Illness     Carolyn Rowland is a 73 year old female who presents with a two-month history of pain in her right hip. This is located in the lateral hip and radiates down as far the knee. The pain is described as an intermittent ache, worse if she stands or walks and better if she sits down or lies down. It's a 9/10 when she is bearing weight and now is 0 she thinks she may have had some swelling of the right leg. There is numbness and tingling in the thigh and calf. The leg feels weak. She has a history of sciatica in the past. She denies any recent injury. She's had no lower back or abdominal pain. She denies any history of DVT or thrombophlebitis. She's had no fever, chills, or weight loss. She denies any bladder or bowel dysfunction or saddle anesthesia.  Review of Systems     Other than as noted above, the patient denies any of the following symptoms: Systemic:  No fevers or chills.   Musculoskeletal:  No arthritis, back pain, or neck pain. Neurological:  No muscular weakness or paresthesias.  [place]    Past medical history, family history, social history, meds, and allergies were reviewed.  She is allergic to penicillin. She has a history of coronary artery disease with a stent. Current meds include aspirin, Lipitor, Imdur, Toprol, Prilosec, Aldactone, Brilinta, Neurontin, and nitroglycerin  Physical Exam    Vital signs:  BP 145/84 mmHg  Pulse 68  Temp(Src) 98 F (36.7 C) (Oral)  Resp 12  SpO2 98% Gen:  Alert and oriented times 3.  In no distress. Lungs: Clear to auscultation. Heart: Regular rhythm, grade 3/6 systolic ejection murmur over the entire precordium. Abdomen: Soft, nontender, no organomegaly or mass. No pulsatile midline abdominal mass or bruit. Back: No CVA tenderness to percussion or tenderness to percussion over the spine. Musculoskeletal: There is mild pain to palpation over the lateral hip. The hip has a limited  range of motion with pain, straight leg raising was positive.  Otherwise, all joints had a full a ROM with no swelling, bruising or deformity.  No edema, pulses full. Extremities were warm and pink.  Capillary refill was brisk.  Skin:  Clear, warm and dry.  No rash. Neuro:  Alert and oriented times 3.  Muscle strength was normal.  Sensation was intact to light touch.    Labs   Results for orders placed or performed during the hospital encounter of 06/03/14  D-dimer, quantitative  Result Value Ref Range   D-Dimer, Quant 0.57 (H) 0.00 - 0.48 ug/mL-FEU   Radiology     Dg Hip Complete Right  06/03/2014   CLINICAL DATA:  73 year old female with chronic right hip pain and the sciatica  EXAM: RIGHT HIP - COMPLETE 2+ VIEW  COMPARISON:  None.  FINDINGS: There is no evidence of hip fracture or dislocation. There is no evidence of arthropathy or other focal bone abnormality.  IMPRESSION: Negative.   Electronically Signed   By: Jacqulynn Cadet M.D.   On: 06/03/2014 12:42    I reviewed the images independently and personally and concur with the radiologist's findings.  Assessment    The primary encounter diagnosis was Sciatica, right. A diagnosis of Hip pain was also pertinent to this visit.  The d-dimer value of 0.57 is below her age just cut off range of 0.71. This makes DVT unlikely, especially in view of a  Wells score of 1 which is low risk.  Plan   1.  Meds:  The following meds were prescribed:   Discharge Medication List as of 06/03/2014  1:14 PM    START taking these medications   Details  gabapentin (NEURONTIN) 300 MG capsule 1 daily for 3 days, 1 BID for 3 days, 1 TID, Normal    predniSONE (DELTASONE) 20 MG tablet Take 3 daily for 5 days, 2 daily for 5 days, 1 daily for 5 days., Normal    traMADol (ULTRAM) 50 MG tablet Take 2 tablets (100 mg total) by mouth every 8 (eight) hours as needed., Starting 06/03/2014, Until Discontinued, Print        2.  Patient  Education/Counseling:  The patient was given appropriate handouts, self care instructions, and instructed in pain control, rest and activity, elevation, application of ice and compression.  She was given back exercises to start on.  3.  Follow up:  The patient was told to follow up here if no better in 3 to 4 days, or sooner if becoming worse in any way, and given some red flag symptoms such as worsening pain or new neurological symptoms which would prompt immediate return. Follow-up with her primary care physician in one week.    Harden Mo, MD 06/03/14 2113

## 2014-06-13 ENCOUNTER — Encounter (HOSPITAL_COMMUNITY): Payer: Self-pay | Admitting: Cardiology

## 2014-06-17 ENCOUNTER — Encounter: Payer: Self-pay | Admitting: Internal Medicine

## 2014-07-17 ENCOUNTER — Other Ambulatory Visit: Payer: Self-pay | Admitting: Family Medicine

## 2014-07-17 DIAGNOSIS — M5416 Radiculopathy, lumbar region: Secondary | ICD-10-CM

## 2014-07-17 DIAGNOSIS — Z803 Family history of malignant neoplasm of breast: Secondary | ICD-10-CM | POA: Diagnosis not present

## 2014-07-17 DIAGNOSIS — Z1231 Encounter for screening mammogram for malignant neoplasm of breast: Secondary | ICD-10-CM | POA: Diagnosis not present

## 2014-07-23 DIAGNOSIS — R921 Mammographic calcification found on diagnostic imaging of breast: Secondary | ICD-10-CM | POA: Diagnosis not present

## 2014-07-26 ENCOUNTER — Other Ambulatory Visit: Payer: Commercial Managed Care - HMO

## 2014-08-03 ENCOUNTER — Ambulatory Visit
Admission: RE | Admit: 2014-08-03 | Discharge: 2014-08-03 | Disposition: A | Discharge status: Home / Self Care | Payer: Commercial Managed Care - HMO | Source: Ambulatory Visit | Attending: Family Medicine | Admitting: Family Medicine

## 2014-08-03 DIAGNOSIS — M5416 Radiculopathy, lumbar region: Secondary | ICD-10-CM

## 2014-08-03 DIAGNOSIS — M4806 Spinal stenosis, lumbar region: Secondary | ICD-10-CM | POA: Diagnosis not present

## 2014-08-03 DIAGNOSIS — M47816 Spondylosis without myelopathy or radiculopathy, lumbar region: Secondary | ICD-10-CM | POA: Diagnosis not present

## 2014-08-03 DIAGNOSIS — M5126 Other intervertebral disc displacement, lumbar region: Secondary | ICD-10-CM | POA: Diagnosis not present

## 2014-08-03 DIAGNOSIS — M4316 Spondylolisthesis, lumbar region: Secondary | ICD-10-CM | POA: Diagnosis not present

## 2014-08-07 DIAGNOSIS — Z0000 Encounter for general adult medical examination without abnormal findings: Secondary | ICD-10-CM | POA: Diagnosis not present

## 2014-08-07 DIAGNOSIS — C50911 Malignant neoplasm of unspecified site of right female breast: Secondary | ICD-10-CM | POA: Diagnosis not present

## 2014-08-07 DIAGNOSIS — M5126 Other intervertebral disc displacement, lumbar region: Secondary | ICD-10-CM | POA: Diagnosis not present

## 2014-08-07 DIAGNOSIS — M4800 Spinal stenosis, site unspecified: Secondary | ICD-10-CM | POA: Diagnosis not present

## 2014-08-07 DIAGNOSIS — M549 Dorsalgia, unspecified: Secondary | ICD-10-CM | POA: Diagnosis not present

## 2014-08-07 DIAGNOSIS — M5416 Radiculopathy, lumbar region: Secondary | ICD-10-CM | POA: Diagnosis not present

## 2014-08-08 ENCOUNTER — Other Ambulatory Visit: Payer: Self-pay | Admitting: Radiology

## 2014-08-08 DIAGNOSIS — C50911 Malignant neoplasm of unspecified site of right female breast: Secondary | ICD-10-CM

## 2014-08-20 ENCOUNTER — Ambulatory Visit
Admission: RE | Admit: 2014-08-20 | Discharge: 2014-08-20 | Disposition: A | Discharge status: Home / Self Care | Payer: Commercial Managed Care - HMO | Source: Ambulatory Visit | Attending: Radiology | Admitting: Radiology

## 2014-08-20 DIAGNOSIS — C50911 Malignant neoplasm of unspecified site of right female breast: Secondary | ICD-10-CM

## 2014-08-20 DIAGNOSIS — C50919 Malignant neoplasm of unspecified site of unspecified female breast: Secondary | ICD-10-CM | POA: Diagnosis not present

## 2014-08-20 MED ORDER — GADOBENATE DIMEGLUMINE 529 MG/ML IV SOLN: 20.0000 mL | Freq: Once | INTRAVENOUS | Status: AC | PRN

## 2014-08-21 ENCOUNTER — Encounter (INDEPENDENT_AMBULATORY_CARE_PROVIDER_SITE_OTHER): Payer: Self-pay | Admitting: General Surgery

## 2014-08-21 DIAGNOSIS — C50411 Malignant neoplasm of upper-outer quadrant of right female breast: Secondary | ICD-10-CM | POA: Diagnosis not present

## 2014-08-21 NOTE — Progress Notes (Signed)
Patient ID: Carolyn Rowland, female   DOB: [birth year], 74 y.o.   MRN: [id number]  [name] 08/21/2014 11:07 AM Location: [facility] Patient #: [id number] DOB: Jun 28, 1941 Married / Language: English / Race: Black or African American Female History of Present Illness [name] MD; 08/21/2014 1:28 PM) Patient words: breast f/u.  The patient is a 74 year old female    Note:She is referred by Dr. Marcelo Baldy for further diagnosis and treatment of invasive ductal carcinoma of the right breast. She was noted to have suspicious microcalcifications in the right breast on her annual mammogram. These measured 5 mm. Image guided biopsy was performed which demonstrated invasive ductal carcinoma with papillary features. ER/PR positive, HER-2 is pending, proliferation rate 7%. MRI demonstrated an area 1.8 cm in size but no other suspicious lesions. She was presented this morning at the multidisciplinary breast cancer conference. All involved felt she was a good candidate for breast conservation therapy. Her mother had breast cancer. She denies any breast masses or nipple discharge. Age at menarche was 59. First child was born before the age of 6. Menopause was at age 15. She took hormone replacement therapy for approximately 10 years. She is here by herself.  Other Problems Davy Pique Bynum, CMA; 08/21/2014 11:07 AM) Arthritis Heart murmur High blood pressure Oophorectomy  Past Surgical History Marjean Donna, CMA; 08/21/2014 11:07 AM) Appendectomy Breast Biopsy Right. Hysterectomy (not due to cancer) - Complete Hysterectomy (not due to cancer) - Partial  Diagnostic Studies History Marjean Donna, CMA; 08/21/2014 11:07 AM) Colonoscopy 5-10 years ago Mammogram within last year Pap Smear >5 years ago  Allergies Marjean Donna, CMA; 08/21/2014 11:09 AM) Penicillamine *ASSORTED CLASSES*  Medication History (Sonya Bynum, CMA; 08/21/2014 11:10 AM) Gabapentin (300MG  Capsule, Oral) Active. Isosorbide Mononitrate ER (60MG Tablet ER 24HR, Oral) Active. Metoprolol Succinate ER (50MG Tablet ER 24HR, Oral) Active. Cyclobenzaprine HCl (5MG Tablet, Oral) Active. Atorvastatin Calcium (80MG Tablet, Oral) Active. Spironolactone (25MG Tablet, Oral) Active.  Social History ([place]; 08/21/2014 11:07 AM) Caffeine use Coffee. Illicit drug use Remotely quit drug use. Tobacco use Former smoker.  Family History Marjean Donna, [employer]; 08/21/2014 11:07 AM) Alcohol Abuse Father. Breast Cancer Mother. Colon Cancer Brother. Heart Disease Family Members In General.  Pregnancy / Birth History Marjean Donna, CMA; 08/21/2014 11:07 AM) Age at menarche 44 years. Age of menopause <47 Gravida 3 Maternal age 44-20 Para 3 Regular periods     Review of Systems ([place]; 08/21/2014 11:07 AM) General Present- Chills. Not Present- Appetite Loss, Fatigue, Fever, Night Sweats, Weight Gain and Weight Loss. Skin Not Present- Change in Wart/Mole, Dryness, Hives, Jaundice, New Lesions, Non-Healing Wounds, Rash and Ulcer. HEENT Not Present- Earache, Hearing Loss, Hoarseness, Nose Bleed, Oral Ulcers, Ringing in the Ears, Seasonal Allergies, Sinus Pain, Sore Throat, Visual Disturbances, Wears glasses/contact lenses and Yellow Eyes. Respiratory Not Present- Bloody sputum, Chronic Cough, Difficulty Breathing, Snoring and Wheezing. Breast Not Present- Breast Mass, Breast Pain, Nipple Discharge and Skin Changes. Cardiovascular Present- Shortness of Breath. Not Present- Chest Pain, Difficulty Breathing Lying Down, Leg Cramps, Palpitations, Rapid Heart Rate and Swelling of Extremities. Gastrointestinal Not Present- Abdominal Pain, Bloating, Bloody Stool, Change in Bowel Habits, Chronic diarrhea, Constipation, Difficulty Swallowing, Excessive gas, Gets full quickly at meals, Hemorrhoids, Indigestion, Nausea, Rectal Pain and Vomiting. Female Genitourinary Not Present-  Frequency, Nocturia, Painful Urination, Pelvic Pain and Urgency. Musculoskeletal Present- Back Pain and Muscle Pain. Not Present- Joint Pain, Joint Stiffness, Muscle Weakness and Swelling of Extremities. Neurological  Present- Trouble walking. Not Present- Decreased Memory, Fainting, Headaches, Numbness, Seizures, Tingling, Tremor and Weakness. Psychiatric Not Present- Anxiety, Bipolar, Change in Sleep Pattern, Depression, Fearful and Frequent crying. Endocrine Not Present- Cold Intolerance, Excessive Hunger, Hair Changes, Heat Intolerance, Hot flashes and New Diabetes. Hematology Present- Easy Bruising. Not Present- Excessive bleeding, Gland problems, HIV and Persistent Infections.  Vitals (Sonya Bynum CMA; 08/21/2014 11:09 AM) 08/21/2014 11:08 AM Weight: 217 lb Height: 65in Body Surface Area: 2.12 m Body Mass Index: 36.11 kg/m Temp.: 72F(Temporal)  Pulse: 54 (Regular)  BP: 134/78 (Sitting, Left Arm, Standard)     Physical Exam [name] MD; 08/21/2014 1:29 PM)  The physical exam findings are as follows: Note:General: Overweight female in NAD. Pleasant and cooperative.  HEENT: [place]/AT, no facial masses  EYES: EOMI, no icterus  NECK: Supple, no obvious mass or thyroid enlargement.  CV: RRR, there is a murmur.  CHEST: Breath sounds equal and clear. Respirations nonlabored.  BREASTS: Symmetrical in size. No dominant masses, nipple discharge or suspicious skin lesions. There is ecchymosis superiorly in the right breast.  ABDOMEN: Soft, nontender, nondistended, no masses, no organomegaly.  LYMPHATIC: No palpable cervical, supraclavicular, axillary adenopathy.  NEUROLOGIC: Alert and oriented, answers questions appropriately, normal gait and station.  PSYCHIATRIC: Normal mood, affect , and behavior.    Assessment & Plan [name] MD; 08/21/2014 1:32 PM)  MALIGNANT NEOPLASM OF UPPER-OUTER QUADRANT OF RIGHT FEMALE BREAST (174.4   C50.411) Impression: Newly diagnosed invasive ductal carcinoma of the right breast. We discussed this as well as the surgical options. I feel she is a good candidate for right breast lumpectomy after localization and sentinel lymph node biopsy versus mastectomy. She is interested in this. We discussed the potential for further needed treatment depending on the final staging.  Plan: Referral to medical oncology. Discuss with her cardiologist (Dr. Einar Gip) preoperative risk assessment. I explained the procedure, risks, and aftercare of breast conservation surgery. The risks include but are not limited to bleeding, infection, wound problems, seroma formation, anesthesia, nerve injury, lymphedema, need for reexcision or removal of more lymph nodes at a later time. She seems to understand and agrees with the plan. I will speak with her again after she is seen by medical oncology.  Current Plans Free Text Instructions : discussed with patient and provided information. Follow up as needed Referred to Oncology, for evaluation and follow up (Oncology).  Jackolyn Confer, MD

## 2014-08-22 ENCOUNTER — Telehealth: Payer: Self-pay | Admitting: *Deleted

## 2014-08-22 NOTE — Telephone Encounter (Signed)
Received referral from [facility].  Called pt and confirmed 08/27/14 appt w/ her.  Unable to mail before appt letter - gave verbal.  Unable to mail welcoming packet - gave instructions and directions.  Unable to mail intake form - placed a note for one to be given at time of check in.  Emailed Engineer, civil (consulting) at [employer] to make her aware.  Placed a copy of records in Dr. Geralyn Flash box and took one to HIM to scan.

## 2014-08-26 DIAGNOSIS — M549 Dorsalgia, unspecified: Secondary | ICD-10-CM | POA: Diagnosis not present

## 2014-08-26 DIAGNOSIS — M4316 Spondylolisthesis, lumbar region: Secondary | ICD-10-CM | POA: Diagnosis not present

## 2014-08-27 ENCOUNTER — Encounter: Payer: Self-pay | Admitting: Hematology and Oncology

## 2014-08-27 ENCOUNTER — Encounter (INDEPENDENT_AMBULATORY_CARE_PROVIDER_SITE_OTHER): Payer: Self-pay

## 2014-08-27 ENCOUNTER — Encounter: Payer: Self-pay | Admitting: *Deleted

## 2014-08-27 ENCOUNTER — Ambulatory Visit: Payer: Commercial Managed Care - HMO

## 2014-08-27 ENCOUNTER — Ambulatory Visit (HOSPITAL_BASED_OUTPATIENT_CLINIC_OR_DEPARTMENT_OTHER): Payer: Commercial Managed Care - HMO | Admitting: Hematology and Oncology

## 2014-08-27 VITALS — BP 137/72 | HR 75 | Temp 97.9°F | Resp 18 | Wt 216.6 lb

## 2014-08-27 DIAGNOSIS — C50411 Malignant neoplasm of upper-outer quadrant of right female breast: Secondary | ICD-10-CM | POA: Insufficient documentation

## 2014-08-27 DIAGNOSIS — Z170 Estrogen receptor positive status [ER+]: Secondary | ICD-10-CM | POA: Diagnosis not present

## 2014-08-27 DIAGNOSIS — C50811 Malignant neoplasm of overlapping sites of right female breast: Secondary | ICD-10-CM

## 2014-08-27 NOTE — Assessment & Plan Note (Addendum)
Right breast invasive ductal carcinoma 1.8 cm tumor T1 cN0 M0 clinical stage ER 99%, PR 97%, Ki-67 7%, HER-2 negative  Pathology and radiology review:Discussed with the patient, the details of pathology including the type of breast cancer,the clinical staging, the significance of ER, PR and HER-2/neu receptors and the implications for treatment. After reviewing the pathology in detail, we proceeded to discuss the different treatment options between surgery, radiation, and antiestrogen therapies.  Recommendation: 1. Breast conserving surgery followed by 2. Followed by radiation 3. Follow-up antiestrogen therapy  Social issues: Patient is here by herself but does report that she has extensive family and friend network to support her. She is very emotional because her mother had breast cancer which involved the right hand and did not heal under the arm for a very long time. She is afraid of similar problem happening to her. I tried to reassure her that if she follows postop directions, it is very rare to have nonhealing wounds under the arm.  Return to clinic after surgery to discuss final pathology.

## 2014-08-27 NOTE — Progress Notes (Signed)
[facility] CONSULT NOTE  Patient Care Team: Jerlyn Ly, MD as PCP - General (Internal Medicine)  CHIEF COMPLAINTS/PURPOSE OF CONSULTATION:  Newly diagnosed breast cancer  HISTORY OF PRESENTING ILLNESS:  Carolyn Rowland 74 y.o. female is here because of recent diagnosis of right-sided breast cancer. Patient had a routine screening mammogram that revealed a new cluster of heterogeneous calcifications at 12:00 position middle depth which was suspicious for malignancy. She had a breast density category C. She was then sent for ultrasound and underwent a right breast needle core biopsy on 08/07/2014 which revealed invasive ductal carcinoma with papillary features associated microcalcifications ER/PR positive HER-2 negative with a Ki-67 7%. She was seen by Dr. Zella Richer and was sent to me to discuss treatment options. Patient is here by herself and appeared to be very emotional and tearful. When questioned the reasons behind this, she reported that her mother had breast cancer involving the right side of the breast. She had not healed from the axillary lymph node surgery. She is very concerned that she would have the same outcome after undergoing surgery.  I reviewed her records extensively and collaborated the history with the patient.  SUMMARY OF ONCOLOGIC HISTORY:   Breast cancer of upper-outer quadrant of right female breast   08/07/2014 Initial Biopsy Right breast biopsy: Invasive ductal carcinoma with papillary features associated microcalcifications, ER 99%, PR 97%, Ki-67 7%, HER-2 negative by fish   08/20/2014 Breast MRI Right breast: 1.8 cm enhancement at 11:00, left breast 7 x 9 mm oval mass at 9:00 stable from 2010-benign    In terms of breast cancer risk profile:  She menarched at early age of 68 and went to menopause at age 10  She had 3 pregnancy, her first child was born at age 23  She has not received birth control pills.  She was never exposed to fertility  medications or hormone replacement therapy.  She has  family history of Breast/GYN/GI cancer 2 brothers with colon cancer one brother died of heart attack and mother had breast cancer died at age 63  MEDICAL HISTORY:  Past Medical History  Diagnosis Date  . Heart murmur   . Hypertension   . High cholesterol   . Pneumonia 1970's    "once"  . H/O hiatal hernia   . GERD (gastroesophageal reflux disease)   . Arthritis     "hands; legs" (08/17/2013)    SURGICAL HISTORY: Past Surgical History  Procedure Laterality Date  . Cardiac catheterization  08/07/2013  . Coronary angioplasty with stent placement  08/17/2013    "6 stents"(08/17/2013)  . Appendectomy  1960  . Abdominal hysterectomy  1985  . Tubal ligation  1980  . Left and right heart catheterization with coronary angiogram N/A 08/07/2013    Procedure: LEFT AND RIGHT HEART CATHETERIZATION WITH CORONARY ANGIOGRAM;  Surgeon: Laverda Page, MD;  Location[facility] C[facility] LAB;  Service: Cardiovascular;  Laterality: N/A;  . Percutaneous coronary stent intervention (pci-s) N/A 08/17/2013    Procedure: PERCUTANEOUS CORONARY STENT INTERVENTION (PCI-S);  Surgeon: Laverda Page, MD;  Location: [facility] CATH LAB;  Service: Cardiovascular;  Laterality: N/A;    SOCIAL HISTORY: History   Social History  . Marital Status: Married    Spouse Name: N/A  . Number of Children: N/A  . Years of Education: N/A   Occupational History  . Not on file.   Social History Main Topics  . Smoking status: Former Smoker -- 1.00 packs/day for 52 years  Types: Cigarettes    Quit date: 04/04/2014  . Smokeless tobacco: Never Used  . Alcohol Use: No     Comment: 08/17/2013 "quit drinking at age 51; never did drink much"  . Drug Use: No  . Sexual Activity: No   Other Topics Concern  . Not on file   Social History Narrative    FAMILY HISTORY: Family History  Problem Relation Age of Onset  . Cancer Brother   . Cancer Brother   . Heart attack Brother      ALLERGIES:  is allergic to penicillins.  MEDICATIONS:  Current Outpatient Prescriptions  Medication Sig Dispense Refill  . aspirin 81 MG chewable tablet Chew 1 tablet (81 mg total) by mouth daily.    Mar[place] Kitchen atorvastatin (LIPITOR) 80 MG tablet Take 1 tablet (80 mg total) by mouth daily. 30 tablet 1  . Calcium Carb-Cholecalciferol (CALCIUM 600 + D PO) Take 1 tablet by mouth daily.    . cyclobenzaprine (FLEXERIL) 5 MG tablet   0  . gabapentin (NEURONTIN) 300 MG capsule 1 daily for 3 days, 1 BID for 3 days, 1 TID 45 capsule 0  . isosorbide mononitrate (IMDUR) 60 MG 24 hr tablet Take 1 tablet (60 mg total) by mouth daily. 30 tablet 1  . metoprolol succinate (TOPROL-XL) 50 MG 24 hr tablet Take 50 mg by mouth daily. Take with or immediately following a meal.    . nitroGLYCERIN (NITROSTAT) 0.4 MG SL tablet Place 1 tablet (0.4 mg total) under the tongue every 5 (five) minutes as needed for chest pain. 30 tablet 1  . omeprazole (PRILOSEC) 20 MG capsule Take 20 mg by mouth every other day.     . predniSONE (DELTASONE) 20 MG tablet Take 3 daily for 5 days, 2 daily for 5 days, 1 daily for 5 days. 30 tablet 0  . spironolactone (ALDACTONE) 25 MG tablet Take 25 mg by mouth daily.    . Ticagrelor (BRILINTA) 90 MG TABS tablet Take 1 tablet (90 mg total) by mouth 2 (two) times daily. 60 tablet 0  . traMADol (ULTRAM) 50 MG tablet Take 2 tablets (100 mg total) by mouth every 8 (eight) hours as needed. 30 tablet 0   No current facility-administered medications for this visit.    REVIEW OF SYSTEMS:   Constitutional: Denies fevers, chills or abnormal night sweats Eyes: Denies blurriness of vision, double vision or watery eyes Ears, nose, mouth, throat, and face: Denies mucositis or sore throat Respiratory: Denies cough, dyspnea or wheezes Cardiovascular: Denies palpitation, chest discomfort or lower extremity swelling Gastrointestinal:  Denies nausea, heartburn or change in bowel habits Skin: Denies abnormal  skin rashes Lymphatics: Denies new lymphadenopathy or easy bruising Neurological:Denies numbness, tingling or new weaknesses Behavioral/Psych: Mood is stable, no new changes  Breast:  Denies any palpable lumps or discharge All other systems were reviewed with the patient and are negative.  PHYSICAL EXAMINATION: ECOG PERFORMANCE STATUS: 1 - Symptomatic but completely ambulatory  Filed Vitals:   08/27/14 1605  BP: 137/72  Pulse: 75  Temp: 97.9 F (36.6 C)  Resp: 18   Filed Weights   08/27/14 1605  Weight: 216 lb 9 oz (98.232 kg)    GENERAL:alert, no distress and comfortable SKIN: skin color, texture, turgor are normal, no rashes or significant lesions EYES: normal, conjunctiva are pink and non-injected, sclera clear OROPHARYNX:no exudate, no erythema and lips, buccal mucosa, and tongue normal  NECK: supple, thyroid normal size, non-tender, without nodularity LYMPH:  no palpable lymphadenopathy in the  cervical, axillary or inguinal LUNGS: clear to auscultation and percussion with normal breathing effort HEART: regular rate & rhythm and no murmurs and no lower extremity edema ABDOMEN:abdomen soft, non-tender and normal bowel sounds Musculoskeletal:no cyanosis of digits and no clubbing  PSYCH: alert & oriented x 3 with fluent speech NEURO: no focal motor/sensory deficits BREAST: No palpable nodules in breast. No palpable axillary or supraclavicular lymphadenopathy (exam performed in the presence of a chaperone)   LABORATORY DATA:  I have reviewed the data as listed Lab Results  Component Value Date   WBC 9.1 08/18/2013   HGB 12.5 08/18/2013   HCT 34.7* 08/18/2013   MCV 79.0 08/18/2013   PLT 188 08/18/2013   Lab Results  Component Value Date   NA 140 08/19/2013   K 4.2 08/19/2013   CL 105 08/19/2013   CO2 24 08/19/2013    RADIOGRAPHIC STUDIES: I have personally reviewed the radiological reports and agreed with the findings in the report. MRI breast has been some  rises above  ASSESSMENT AND PLAN:  Breast cancer of upper-outer quadrant of right female breast Right breast invasive ductal carcinoma 1.8 cm tumor T1 cN0 M0 clinical stage ER 99%, PR 97%, Ki-67 7%, HER-2 negative  Pathology and radiology review:Discussed with the patient, the details of pathology including the type of breast cancer,the clinical staging, the significance of ER, PR and HER-2/neu receptors and the implications for treatment. After reviewing the pathology in detail, we proceeded to discuss the different treatment options between surgery, radiation, and antiestrogen therapies.  Recommendation: 1. Breast conserving surgery followed by 2. Followed by radiation 3. Follow-up antiestrogen therapy  Given the low proliferation index unfavorable prognostic features and her underlying comorbidities including heart disease, I do not believe there would be any role of chemotherapy in her situation. However if the final pathology shows lymph node involvement we can consider offering systemic chemotherapy.  Social issues: Patient is here by herself but does report that she has extensive family and friend network to support her. She is very emotional because her mother had breast cancer which involved the right hand and did not heal under the arm for a very long time. She is afraid of similar problem happening to her. I tried to reassure her that if she follows postop directions, it is very rare to have nonhealing wounds under the arm.  Return to clinic after surgery to discuss final pathology.  All questions were answered. The patient knows to call the clinic with any problems, questions or concerns.    Rulon Eisenmenger, MD 4:42 PM

## 2014-08-27 NOTE — Progress Notes (Signed)
Checked in new pt with no financial concerns prior to seeing the dr.  Pt has Raquel's card for any billing questions or concerns.  ° °

## 2014-08-27 NOTE — Addendum Note (Signed)
Addended by: Prentiss Bells on: 08/27/2014 05:43 PM   Modules accepted: Orders

## 2014-08-28 ENCOUNTER — Telehealth: Payer: Self-pay | Admitting: Hematology and Oncology

## 2014-08-28 NOTE — Progress Notes (Signed)
Met with pt during new pt appt with Dr. Lindi Adie. Pt denies needs at this time. Gave pt navigation resources and contact information. Gave pt Care Plan Summary and discussed in detail. Encourage pt to call with questions or concern. Received verbal understanding. Contact information given.

## 2014-08-28 NOTE — Telephone Encounter (Signed)
Per the 2/23 pof pt needs to see dr Zella Richer and per stephanie his surg scheduler she has to have a surg clearance by her cardiologist Webb Silversmith This was sent to dr Lindi Adie and Beverlee Nims as a staff message

## 2014-09-04 ENCOUNTER — Inpatient Hospital Stay (HOSPITAL_COMMUNITY)
Admission: EM | Admit: 2014-09-04 | Discharge: 2014-09-05 | DRG: 195 | Disposition: A | Discharge status: Home / Self Care | Payer: Commercial Managed Care - HMO | Attending: Family Medicine | Admitting: Family Medicine

## 2014-09-04 ENCOUNTER — Encounter (HOSPITAL_COMMUNITY): Payer: Self-pay

## 2014-09-04 ENCOUNTER — Emergency Department (HOSPITAL_COMMUNITY): Payer: Commercial Managed Care - HMO

## 2014-09-04 DIAGNOSIS — E780 Pure hypercholesterolemia: Secondary | ICD-10-CM | POA: Diagnosis present

## 2014-09-04 DIAGNOSIS — E669 Obesity, unspecified: Secondary | ICD-10-CM | POA: Diagnosis present

## 2014-09-04 DIAGNOSIS — Z7982 Long term (current) use of aspirin: Secondary | ICD-10-CM | POA: Diagnosis not present

## 2014-09-04 DIAGNOSIS — K219 Gastro-esophageal reflux disease without esophagitis: Secondary | ICD-10-CM | POA: Diagnosis not present

## 2014-09-04 DIAGNOSIS — I10 Essential (primary) hypertension: Secondary | ICD-10-CM | POA: Diagnosis not present

## 2014-09-04 DIAGNOSIS — C50411 Malignant neoplasm of upper-outer quadrant of right female breast: Secondary | ICD-10-CM | POA: Diagnosis not present

## 2014-09-04 DIAGNOSIS — J189 Pneumonia, unspecified organism: Secondary | ICD-10-CM | POA: Diagnosis not present

## 2014-09-04 DIAGNOSIS — J90 Pleural effusion, not elsewhere classified: Secondary | ICD-10-CM | POA: Diagnosis not present

## 2014-09-04 DIAGNOSIS — C50419 Malignant neoplasm of upper-outer quadrant of unspecified female breast: Secondary | ICD-10-CM | POA: Diagnosis not present

## 2014-09-04 DIAGNOSIS — Z87891 Personal history of nicotine dependence: Secondary | ICD-10-CM | POA: Diagnosis not present

## 2014-09-04 DIAGNOSIS — R000 Tachycardia, unspecified: Secondary | ICD-10-CM | POA: Diagnosis not present

## 2014-09-04 DIAGNOSIS — Z23 Encounter for immunization: Secondary | ICD-10-CM

## 2014-09-04 DIAGNOSIS — Z955 Presence of coronary angioplasty implant and graft: Secondary | ICD-10-CM | POA: Diagnosis not present

## 2014-09-04 DIAGNOSIS — D72829 Elevated white blood cell count, unspecified: Secondary | ICD-10-CM | POA: Diagnosis present

## 2014-09-04 DIAGNOSIS — I2510 Atherosclerotic heart disease of native coronary artery without angina pectoris: Secondary | ICD-10-CM | POA: Diagnosis present

## 2014-09-04 DIAGNOSIS — R05 Cough: Secondary | ICD-10-CM | POA: Diagnosis not present

## 2014-09-04 DIAGNOSIS — Z79899 Other long term (current) drug therapy: Secondary | ICD-10-CM | POA: Diagnosis not present

## 2014-09-04 DIAGNOSIS — Z6834 Body mass index (BMI) 34.0-34.9, adult: Secondary | ICD-10-CM | POA: Diagnosis not present

## 2014-09-04 DIAGNOSIS — E876 Hypokalemia: Secondary | ICD-10-CM | POA: Diagnosis not present

## 2014-09-04 DIAGNOSIS — R509 Fever, unspecified: Secondary | ICD-10-CM | POA: Diagnosis not present

## 2014-09-04 DIAGNOSIS — M1990 Unspecified osteoarthritis, unspecified site: Secondary | ICD-10-CM | POA: Diagnosis present

## 2014-09-04 LAB — URINALYSIS, ROUTINE W REFLEX MICROSCOPIC
Bilirubin Urine: NEGATIVE
Glucose, UA: NEGATIVE mg/dL
Hgb urine dipstick: NEGATIVE
Ketones, ur: NEGATIVE mg/dL
Nitrite: NEGATIVE
Protein, ur: 100 mg/dL — AB
Specific Gravity, Urine: 1.019 (ref 1.005–1.030)
Urobilinogen, UA: >8 mg/dL — ABNORMAL HIGH (ref 0.0–1.0)
pH: 7.5 (ref 5.0–8.0)

## 2014-09-04 LAB — CBC WITH DIFFERENTIAL/PLATELET
Basophils Absolute: 0 10*3/uL (ref 0.0–0.1)
Basophils Relative: 0 % (ref 0–1)
Eosinophils Absolute: 0 10*3/uL (ref 0.0–0.7)
Eosinophils Relative: 0 % (ref 0–5)
HCT: 35 % — ABNORMAL LOW (ref 36.0–46.0)
Hemoglobin: 12.5 g/dL (ref 12.0–15.0)
Lymphocytes Relative: 11 % — ABNORMAL LOW (ref 12–46)
Lymphs Abs: 1.5 10*3/uL (ref 0.7–4.0)
MCH: 27 pg (ref 26.0–34.0)
MCHC: 35.7 g/dL (ref 30.0–36.0)
MCV: 75.6 fL — ABNORMAL LOW (ref 78.0–100.0)
Monocytes Absolute: 1.7 10*3/uL — ABNORMAL HIGH (ref 0.1–1.0)
Monocytes Relative: 12 % (ref 3–12)
Neutro Abs: 10.7 10*3/uL — ABNORMAL HIGH (ref 1.7–7.7)
Neutrophils Relative %: 77 % (ref 43–77)
Platelets: 228 10*3/uL (ref 150–400)
RBC: 4.63 MIL/uL (ref 3.87–5.11)
RDW: 16.4 % — ABNORMAL HIGH (ref 11.5–15.5)
WBC: 13.9 10*3/uL — ABNORMAL HIGH (ref 4.0–10.5)

## 2014-09-04 LAB — URINE MICROSCOPIC-ADD ON

## 2014-09-04 LAB — COMPREHENSIVE METABOLIC PANEL
ALT: 21 U/L (ref 0–35)
AST: 42 U/L — ABNORMAL HIGH (ref 0–37)
Albumin: 2.7 g/dL — ABNORMAL LOW (ref 3.5–5.2)
Alkaline Phosphatase: 81 U/L (ref 39–117)
Anion gap: 9 (ref 5–15)
BUN: 7 mg/dL (ref 6–23)
CO2: 24 mmol/L (ref 19–32)
Calcium: 8.6 mg/dL (ref 8.4–10.5)
Chloride: 104 mmol/L (ref 96–112)
Creatinine, Ser: 0.83 mg/dL (ref 0.50–1.10)
GFR calc Af Amer: 80 mL/min — ABNORMAL LOW (ref >90)
GFR calc non Af Amer: 69 mL/min — ABNORMAL LOW (ref >90)
Glucose, Bld: 159 mg/dL — ABNORMAL HIGH (ref 70–99)
Potassium: 3.4 mmol/L — ABNORMAL LOW (ref 3.5–5.1)
Sodium: 137 mmol/L (ref 135–145)
Total Bilirubin: 1.4 mg/dL — ABNORMAL HIGH (ref 0.3–1.2)
Total Protein: 6.9 g/dL (ref 6.0–8.3)

## 2014-09-04 LAB — I-STAT CG4 LACTIC ACID, ED: Lactic Acid, Venous: 1.65 mmol/L (ref 0.5–2.0)

## 2014-09-04 MED ORDER — DEXTROSE 5 % IV SOLN
1.0000 g | INTRAVENOUS | Status: DC
  Filled 2014-09-04: qty 10

## 2014-09-04 MED ORDER — DEXTROSE 5 % IV SOLN
500.0000 mg | Freq: Once | INTRAVENOUS | Status: AC
  Administered 2014-09-04: 500 mg via INTRAVENOUS
  Filled 2014-09-04: qty 500

## 2014-09-04 MED ORDER — ALBUTEROL SULFATE (2.5 MG/3ML) 0.083% IN NEBU: 2.5000 mg | INHALATION_SOLUTION | RESPIRATORY_TRACT | Status: DC | PRN

## 2014-09-04 MED ORDER — DEXTROSE 5 % IV SOLN
500.0000 mg | INTRAVENOUS | Status: DC
  Filled 2014-09-04: qty 500

## 2014-09-04 MED ORDER — ALBUTEROL SULFATE HFA 108 (90 BASE) MCG/ACT IN AERS
2.0000 | INHALATION_SPRAY | Freq: Once | RESPIRATORY_TRACT | Status: AC
  Administered 2014-09-04: 2 via RESPIRATORY_TRACT
  Filled 2014-09-04: qty 6.7

## 2014-09-04 MED ORDER — SODIUM CHLORIDE 0.9 % IV BOLUS (SEPSIS)
1000.0000 mL | Freq: Once | INTRAVENOUS | Status: AC
  Administered 2014-09-04: 1000 mL via INTRAVENOUS

## 2014-09-04 MED ORDER — DEXTROSE 5 % IV SOLN
1.0000 g | Freq: Once | INTRAVENOUS | Status: AC
  Administered 2014-09-04: 1 g via INTRAVENOUS
  Filled 2014-09-04: qty 10

## 2014-09-04 MED ORDER — ALBUTEROL SULFATE (2.5 MG/3ML) 0.083% IN NEBU
2.5000 mg | INHALATION_SOLUTION | Freq: Four times a day (QID) | RESPIRATORY_TRACT | Status: DC
  Administered 2014-09-05 (×2): 2.5 mg via RESPIRATORY_TRACT
  Filled 2014-09-04 (×2): qty 3

## 2014-09-04 MED ORDER — GUAIFENESIN-DM 100-10 MG/5ML PO SYRP
5.0000 mL | ORAL_SOLUTION | ORAL | Status: DC | PRN
  Administered 2014-09-05: 5 mL via ORAL
  Filled 2014-09-04 (×2): qty 5

## 2014-09-04 MED ORDER — ACETAMINOPHEN 325 MG PO TABS
650.0000 mg | ORAL_TABLET | Freq: Four times a day (QID) | ORAL | Status: DC | PRN
  Administered 2014-09-04: 650 mg via ORAL
  Filled 2014-09-04: qty 2

## 2014-09-04 NOTE — ED Provider Notes (Signed)
CSN: [id number]     Arrival date & time 09/04/14  1946 History   First MD Initiated Contact with Patient 09/04/14 2056     Chief Complaint  Patient presents with  . Influenza     (Consider location/radiation/quality/duration/timing/severity/associated sxs/prior Treatment) The history is provided by the patient.    74 yo F with PMHx of HTN, HLD, CAD who presents with a 1-week history of worsening cough, myalgias, fever, and sputum production. Pt states that over the last 2 weeks, she has had multiple family members sick with "the flu." She states that 1.5 weeks ago she began to experience myalgias and mild cough. Her cough has worsened and is now productive of thick, green-yellow sputum. She has also had worsening malaise and SOB over the last 1-2 days for which she decided to present for evaluation. She has also had fevers at home per her report, unknown TMax. No chest pain. No leg swelling. Sick contacts as above. No known alleviating or aggravating factors.  Past Medical History  Diagnosis Date  . Heart murmur   . Hypertension   . High cholesterol   . Pneumonia 1970's    "once"  . H/O hiatal hernia   . GERD (gastroesophageal reflux disease)   . Arthritis     "hands; legs" (08/17/2013)   Past Surgical History  Procedure Laterality Date  . Cardiac catheterization  08/07/2013  . Coronary angioplasty with stent placement  08/17/2013    "6 stents"(08/17/2013)  . Appendectomy  1960  . Abdominal hysterectomy  1985  . Tubal ligation  1980  . Left and right heart catheterization with coronary angiogram N/A 08/07/2013    Procedure: LEFT AND RIGHT HEART CATHETERIZATION WITH CORONARY ANGIOGRAM;  Surgeon: Laverda Page, MD;  Location: [facility] CATH LAB;  Service: Cardiovascular;  Laterality: N/A;  . Percutaneous coronary stent intervention (pci-s) N/A 08/17/2013    Procedure: PERCUTANEOUS CORONARY STENT INTERVENTION (PCI-S);  Surgeon: Laverda Page, MD;  Location: [facility] CATH LAB;  Service:  Cardiovascular;  Laterality: N/A;   Family History  Problem Relation Age of Onset  . Cancer Brother   . Cancer Brother   . Heart attack Brother    History  Substance Use Topics  . Smoking status: Former Smoker -- 1.00 packs/day for 52 years    Types: Cigarettes    Quit date: 04/04/2014  . Smokeless tobacco: Never Used  . Alcohol Use: No     Comment: 08/17/2013 "quit drinking at age 35; never did drink much"   OB History    No data available     Review of Systems  Constitutional: Positive for fever and fatigue. Negative for chills.  HENT: Negative for congestion, rhinorrhea and sore throat.   Eyes: Negative for visual disturbance.  Respiratory: Positive for cough and shortness of breath. Negative for wheezing.   Cardiovascular: Negative for chest pain and leg swelling.  Gastrointestinal: Negative for nausea and abdominal pain.  Genitourinary: Negative for flank pain.  Musculoskeletal: Negative for neck pain and neck stiffness.  Skin: Negative for rash.  Neurological: Positive for weakness (Generalized). Negative for dizziness and headaches.      Allergies  Penicillins  Home Medications   Prior to Admission medications   Medication Sig Start Date End Date Taking? Authorizing Provider  aspirin 81 MG chewable tablet Chew 1 tablet (81 mg total) by mouth daily. 08/19/13   Laverda Page, MD  atorvastatin (LIPITOR) 80 MG tablet Take 1 tablet (80 mg total) by mouth daily. 08/07/13  Laverda Page, MD  Calcium Carb-Cholecalciferol (CALCIUM 600 + D PO) Take 1 tablet by mouth daily.    Historical Provider, MD  cyclobenzaprine (FLEXERIL) 5 MG tablet  07/17/14   Historical Provider, MD  gabapentin (NEURONTIN) 300 MG capsule 1 daily for 3 days, 1 BID for 3 days, 1 TID 06/03/14   Harden Mo, MD  isosorbide mononitrate (IMDUR) 60 MG 24 hr tablet Take 1 tablet (60 mg total) by mouth daily. 08/07/13   Laverda Page, MD  metoprolol succinate (TOPROL-XL) 50 MG 24 hr tablet Take  50 mg by mouth daily. Take with or immediately following a meal.    Historical Provider, MD  nitroGLYCERIN (NITROSTAT) 0.4 MG SL tablet Place 1 tablet (0.4 mg total) under the tongue every 5 (five) minutes as needed for chest pain. 08/07/13   Laverda Page, MD  omeprazole (PRILOSEC) 20 MG capsule Take 20 mg by mouth every other day.     Historical Provider, MD  predniSONE (DELTASONE) 20 MG tablet Take 3 daily for 5 days, 2 daily for 5 days, 1 daily for 5 days. 06/03/14   Harden Mo, MD  spironolactone (ALDACTONE) 25 MG tablet Take 25 mg by mouth daily.    Historical Provider, MD  Ticagrelor (BRILINTA) 90 MG TABS tablet Take 1 tablet (90 mg total) by mouth 2 (two) times daily. 08/19/13   Laverda Page, MD  traMADol (ULTRAM) 50 MG tablet Take 2 tablets (100 mg total) by mouth every 8 (eight) hours as needed. 06/03/14   Harden Mo, MD   BP 113/70 mmHg  Pulse 96  Temp(Src) 101 F (38.3 C) (Oral)  Resp 22  SpO2 99% Physical Exam  Constitutional: She is oriented to person, place, and time. She appears well-developed and well-nourished. She appears distressed (Appears uncomfortable).  HENT:  Head: Normocephalic and atraumatic.  Mouth/Throat: No oropharyngeal exudate.  Eyes: Conjunctivae are normal. Pupils are equal, round, and reactive to light.  Neck: Normal range of motion. Neck supple.  Cardiovascular: Normal heart sounds and intact distal pulses.  Tachycardia present.  Exam reveals no friction rub.   No murmur heard. Pulmonary/Chest: Effort normal. She has wheezes (Mild, diffuse). She has rales (Left lower lung base). She exhibits no tenderness.  Tachypnea  Abdominal: Soft. Bowel sounds are normal. She exhibits no distension. There is no tenderness.  Musculoskeletal: She exhibits no edema.  Neurological: She is alert and oriented to person, place, and time.  Skin: Skin is warm. No rash noted.  Nursing note and vitals reviewed.   ED Course  Procedures (including critical  care time) Labs Review Labs Reviewed  CBC WITH DIFFERENTIAL/PLATELET - Abnormal; Notable for the following:    WBC 13.9 (*)    HCT 35.0 (*)    MCV 75.6 (*)    RDW 16.4 (*)    Neutro Abs 10.7 (*)    Lymphocytes Relative 11 (*)    Monocytes Absolute 1.7 (*)    All other components within normal limits  COMPREHENSIVE METABOLIC PANEL - Abnormal; Notable for the following:    Potassium 3.4 (*)    Glucose, Bld 159 (*)    Albumin 2.7 (*)    AST 42 (*)    Total Bilirubin 1.4 (*)    GFR calc non Af Amer 69 (*)    GFR calc Af Amer 80 (*)    All other components within normal limits  URINALYSIS, ROUTINE W REFLEX MICROSCOPIC - Abnormal; Notable for the following:  APPearance CLOUDY (*)    Protein, ur 100 (*)    Urobilinogen, UA >8.0 (*)    Leukocytes, UA TRACE (*)    All other components within normal limits  URINE MICROSCOPIC-ADD ON - Abnormal; Notable for the following:    Squamous Epithelial / LPF FEW (*)    Bacteria, UA FEW (*)    All other components within normal limits  CULTURE, BLOOD (ROUTINE X 2)  CULTURE, BLOOD (ROUTINE X 2)  URINE CULTURE  INFLUENZA PANEL BY PCR (TYPE A & B, X5T7)  BASIC METABOLIC PANEL  CBC  I-STAT CG4 LACTIC ACID, ED    Imaging Review Dg Chest Portable 1 View  09/04/2014   CLINICAL DATA:  Productive cough for 1 week with fever.  EXAM: PORTABLE CHEST - 1 VIEW  COMPARISON:  PA and lateral chest 04/07/2007.  FINDINGS: There is focal airspace disease in the left lower lobe lobe. The right lung is clear No pneumothorax or pleural effusion. Heart size is normal.  IMPRESSION: Left lower lobe airspace disease most consistent with pneumonia. Recommend followup to clearing.   Electronically Signed   By: Inge Rise M.D.   On: 09/04/2014 21:44     EKG Interpretation   Date/Time:  Wednesday September 04 2014 21:18:27 EST Ventricular Rate:  100 PR Interval:  133 QRS Duration: 93 QT Interval:  348 QTC Calculation: 449 R Axis:   52 Text Interpretation:   Sinus tachycardia Biatrial enlargement Baseline  wander in lead(s) V2 No significant change was found Confirmed by Wyvonnia Dusky   MD, STEPHEN [phone]) on 09/04/2014 10:40:23 PM      MDM   74 yo F with PMHx of HTN, HLD, CAD who presents with a 1-week history of worsening cough, myalgias, fever, and sputum production. See HPI above. On arrival, T 101F, HR 96, R 22, BP 92/72 (113/70 on repeat), satting 99% on RA. Exam as above, pt ill but non-toxic, with diffuse wheezes and LLL rales.   Pt's presentation is most concerning for possible sepsis 2/2 PNA. DDx includes influenza, other viral pneumonia, anemia. No chest pain, palpitations, or s/s ACS and EKG shows no acute ischemia changes. No leg swelling or s/s DVT to suggest PE. Pt has known recent sick contacts, making infectious etiology more likely. Will start IVF, send lactate, obtain cultures, and plan for ABX coverage for suspected PNA.  Labs as above. CBC with WBC 13.9k. CMP unremarkable. Lactate 1.65. CXR confirms LLL PNA. Rocephin/Azithro ordered as pt denies any hospitalization in >3 months or other risk factors for HCAP. HR improving and tachypnea resolved with fever control. IVF given. Will admit to Hospitalist service. Albuterol ordered for bronchospasm component.  Clinical Impression: 1. CAP (community acquired pneumonia)     Disposition: Admit  Condition: Stable  Pt seen in conjunction with Dr. Everitt Amber, MD 09/05/14 [place], MD 09/05/14 1045

## 2014-09-04 NOTE — H&P (Signed)
[facility] Admission History and Physical       Carolyn Rowland FKC:[id number] DOB: [birth year] DOA: 09/04/2014  Referring physician: EDP PCP: Jerlyn Ly, MD  Specialists:   Chief Complaint: SOB and Cough and Fever  HPI: Carolyn Rowland is a 74 y.o. female with recently Diagnosed Rt Breast Ca Stage I Dx( 02/04) 2016 who presents to the ED with complaints of fevers chills and cough which has been productive of greenish sputum x 1 week.  She has had loss of appetite, and malaise along with SOB.   She was found to have a LLL Pneumonia on chest X-ray and was placed on IV Rocephin and Azithromycin and referred for admission.     Review of Systems:  Constitutional: No Weight Loss, No Weight Gain, Night Sweats, +Fevers, +Chills, Dizziness, Light Headedness, Fatigue, or Generalized Weakness HEENT: No Headaches, Difficulty Swallowing,Tooth/Dental Problems,Sore Throat,  No Sneezing, Rhinitis, Ear Ache, Nasal Congestion, or Post Nasal Drip,  Cardio-vascular:  No Chest pain, Orthopnea, PND, Edema in Lower Extremities, Anasarca, Dizziness, Palpitations  Resp: +Dyspnea, No DOE,  +Productive Cough, No Non-Productive Cough, No Hemoptysis, No Wheezing.    GI: No Heartburn, Indigestion, Abdominal Pain, Nausea, Vomiting, Diarrhea, Constipation, Hematemesis, Hematochezia, Melena, Change in Bowel Habits,  Loss of Appetite  GU: No Dysuria, No Change in Color of Urine, No Urgency or Urinary Frequency, No Flank pain.  Musculoskeletal: No Joint Pain or Swelling, No Decreased Range of Motion, No Back Pain.  Neurologic: No Syncope, No Seizures, Muscle Weakness, Paresthesia, Vision Disturbance or Loss, No Diplopia, No Vertigo, No Difficulty Walking,  Skin: No Rash or Lesions. Psych: No Change in Mood or Affect, No Depression or Anxiety, No Memory loss, No Confusion, or Hallucinations   Past Medical History  Diagnosis Date  . Heart murmur   . Hypertension   . High cholesterol   . Pneumonia 1970's   "once"  . H/O hiatal hernia   . GERD (gastroesophageal reflux disease)   . Arthritis     "hands; legs" (08/17/2013)     Past Surgical History  Procedure Laterality Date  . Cardiac catheterization  08/07/2013  . Coronary angioplasty with stent placement  08/17/2013    "6 stents"(08/17/2013)  . Appendectomy  1960  . Abdominal hysterectomy  1985  . Tubal ligation  1980  . Left and right heart catheterization with coronary angiogram N/A 08/07/2013    Procedure: LEFT AND RIGHT HEART CATHETERIZATION WITH CORONARY ANGIOGRAM;  Surgeon: Laverda Page, MD;  Location: [facility] CATH LAB;  Service: Cardiovascular;  Laterality: N/A;  . Percutaneous coronary stent intervention (pci-s) N/A 08/17/2013    Procedure: PERCUTANEOUS CORONARY STENT INTERVENTION (PCI-S);  Surgeon: Laverda Page, MD;  Location: [facility] CATH LAB;  Service: Cardiovascular;  Laterality: N/A;      Prior to Admission medications   Medication Sig Start Date End Date Taking? Authorizing Provider  aspirin 81 MG chewable tablet Chew 1 tablet (81 mg total) by mouth daily. 08/19/13  Yes Laverda Page, MD  atorvastatin (LIPITOR) 80 MG tablet Take 1 tablet (80 mg total) by mouth daily. 08/07/13  Yes Laverda Page, MD  cyclobenzaprine (FLEXERIL) 5 MG tablet Take 5 mg by mouth daily as needed for muscle spasms.  07/17/14  Yes Historical Provider, MD  isosorbide mononitrate (IMDUR) 60 MG 24 hr tablet Take 1 tablet (60 mg total) by mouth daily. 08/07/13  Yes Laverda Page, MD  metoprolol succinate (TOPROL-XL) 50 MG 24 hr tablet Take 50 mg by mouth daily. Take  with or immediately following a meal.   Yes Historical Provider, MD  nitroGLYCERIN (NITROSTAT) 0.4 MG SL tablet Place 1 tablet (0.4 mg total) under the tongue every 5 (five) minutes as needed for chest pain. 08/07/13  Yes Laverda Page, MD  spironolactone (ALDACTONE) 25 MG tablet Take 25 mg by mouth daily.   Yes Historical Provider, MD  Ticagrelor (BRILINTA) 90 MG TABS tablet Take 1 tablet  (90 mg total) by mouth 2 (two) times daily. 08/19/13  Yes Laverda Page, MD  traMADol (ULTRAM) 50 MG tablet Take 2 tablets (100 mg total) by mouth every 8 (eight) hours as needed. Patient taking differently: Take 100 mg by mouth every 8 (eight) hours as needed for moderate pain.  06/03/14  Yes Harden Mo, MD     Allergies  Allergen Reactions  . Penicillins Rash    Social History:  reports that she quit smoking about 5 months ago. Her smoking use included Cigarettes. She has a 52 pack-year smoking history. She has never used smokeless tobacco. She reports that she does not drink alcohol or use illicit drugs.    Family History  Problem Relation Age of Onset  . Cancer Brother   . Cancer Brother   . Heart attack Brother        Physical Exam:  GEN:  Pleasant Obese 74 y.o. African American female examined and in no acute distress; cooperative with exam Filed Vitals:   09/04/14 2215 09/04/14 2219 09/04/14 2222 09/04/14 2245  BP: 117/57   114/64  Pulse: 93   93  Temp:   100.3 F (37.9 C)   TempSrc:   Oral   Resp: 19   25  Height:  5\' 6"  (1.676 m)    Weight:  97.977 kg (216 lb)    SpO2: 98%   95%   Blood pressure 114/64, pulse 93, temperature 100.3 F (37.9 C), temperature source Oral, resp. rate 25, height 5\' 6"  (1.676 m), weight 97.977 kg (216 lb), SpO2 95 %. PSYCH: She is alert and oriented x4; does not appear anxious does not appear depressed; affect is normal HEENT: Normocephalic and Atraumatic, Mucous membranes pink; PERRLA; EOM intact; Fundi:  Benign;  No scleral icterus, Nares: Patent, Oropharynx: Clear, Fair Dentition,    Neck:  FROM, No Cervical Lymphadenopathy nor Thyromegaly or Carotid Bruit; No JVD; Breasts:: Not examined CHEST WALL: No tenderness CHEST: Normal respiration, clear to auscultation bilaterally HEART: Regular rate and rhythm; no murmurs rubs or gallops BACK: No kyphosis or scoliosis; No CVA tenderness ABDOMEN: Positive Bowel Sounds, Obese, Soft  Non-Tender, No Rebound or Guarding; No Masses, No Organomegaly. Rectal Exam: Not done EXTREMITIES: No Cyanosis, Clubbing, or Edema; No Ulcerations. Genitalia: not examined PULSES: 2+ and symmetric SKIN: Normal hydration no rash or ulceration CNS:  Alert and Oriented x 4, No Focal Deficits Vascular: pulses palpable throughout    Labs on Admission:  Basic Metabolic Panel:  Recent Labs Lab 09/04/14 2127  NA 137  K 3.4*  CL 104  CO2 24  GLUCOSE 159*  BUN 7  CREATININE 0.83  CALCIUM 8.6   Liver Function Tests:  Recent Labs Lab 09/04/14 2127  AST 42*  ALT 21  ALKPHOS 81  BILITOT 1.4*  PROT 6.9  ALBUMIN 2.7*   No results for input(s): LIPASE, AMYLASE in the last 168 hours. No results for input(s): AMMONIA in the last 168 hours. CBC:  Recent Labs Lab 09/04/14 2127  WBC 13.9*  NEUTROABS 10.7*  HGB 12.5  HCT  35.0*  MCV 75.6*  PLT 228   Cardiac Enzymes: No results for input(s): CKTOTAL, CKMB, CKMBINDEX, TROPONINI in the last 168 hours.  BNP (last 3 results) No results for input(s): BNP in the last 8760 hours.  ProBNP (last 3 results) No results for input(s): PROBNP in the last 8760 hours.  CBG: No results for input(s): GLUCAP in the last 168 hours.  Radiological Exams on Admission: Dg Chest Portable 1 View  09/04/2014   CLINICAL DATA:  Productive cough for 1 week with fever.  EXAM: PORTABLE CHEST - 1 VIEW  COMPARISON:  PA and lateral chest 04/07/2007.  FINDINGS: There is focal airspace disease in the left lower lobe lobe. The right lung is clear No pneumothorax or pleural effusion. Heart size is normal.  IMPRESSION: Left lower lobe airspace disease most consistent with pneumonia. Recommend followup to clearing.   Electronically Signed   By: Inge Rise M.D.   On: 09/04/2014 21:44     EKG: Independently reviewed. Sinus Tachycardia rate = 100 no Acute S-T changes       Assessment/Plan:      74 y.o. female with  Principal Problem:   1.   CAP (community  acquired pneumonia)   IV Rocephin   IV Azithromycin   Albuterol Nebs    NCO2 PRN   Monitor O2 sats   Active Problems:   2.   Coronary atherosclerosis of native coronary artery- stable   Continue Brillinta, Imdur, Toprol and Atorvastatin Rx         3.   Breast cancer of upper-outer quadrant of right female breast   Schedule for Follow Up with Dr Zella Richer for Lumpectomy     4.   Leukocytosis- due to #1   Monitor Trend       5.   Hypokalemia   Given KCL PO   Check Magnesium level     6.   DVT Prophylaxis   Lovenox    Code Status:     FULL CODE        Family Communication:   No Family Present   Disposition Plan:    Inpatient      Time spent:  [address] [place] [address] [facility] Pager [phone]   If [place] Please Contact the Day Rounding Team MD for [facility]  If 7PM-7AM, Please Contact Night-Floor Coverage  [email or web address] Password [facility] 09/04/2014, 11:32 PM     ADDENDUM:   Patient was seen and examined on 09/04/2014

## 2014-09-04 NOTE — Progress Notes (Signed)
Attempted to get report from ED but RN stated to call back.

## 2014-09-04 NOTE — ED Notes (Signed)
Pt. Reports generalized body aches, cough with productive green sputum, and fevers. Denies sore throat.

## 2014-09-05 DIAGNOSIS — Z79899 Other long term (current) drug therapy: Secondary | ICD-10-CM | POA: Diagnosis not present

## 2014-09-05 DIAGNOSIS — I10 Essential (primary) hypertension: Secondary | ICD-10-CM | POA: Diagnosis not present

## 2014-09-05 DIAGNOSIS — E780 Pure hypercholesterolemia: Secondary | ICD-10-CM | POA: Diagnosis not present

## 2014-09-05 DIAGNOSIS — J189 Pneumonia, unspecified organism: Secondary | ICD-10-CM | POA: Diagnosis not present

## 2014-09-05 DIAGNOSIS — C50419 Malignant neoplasm of upper-outer quadrant of unspecified female breast: Secondary | ICD-10-CM | POA: Diagnosis not present

## 2014-09-05 DIAGNOSIS — E876 Hypokalemia: Secondary | ICD-10-CM | POA: Diagnosis not present

## 2014-09-05 DIAGNOSIS — K219 Gastro-esophageal reflux disease without esophagitis: Secondary | ICD-10-CM | POA: Diagnosis not present

## 2014-09-05 DIAGNOSIS — I2510 Atherosclerotic heart disease of native coronary artery without angina pectoris: Secondary | ICD-10-CM | POA: Diagnosis not present

## 2014-09-05 DIAGNOSIS — Z7982 Long term (current) use of aspirin: Secondary | ICD-10-CM | POA: Diagnosis not present

## 2014-09-05 LAB — BASIC METABOLIC PANEL
Anion gap: 8 (ref 5–15)
BUN: 6 mg/dL (ref 6–23)
CO2: 24 mmol/L (ref 19–32)
Calcium: 7.8 mg/dL — ABNORMAL LOW (ref 8.4–10.5)
Chloride: 108 mmol/L (ref 96–112)
Creatinine, Ser: 0.84 mg/dL (ref 0.50–1.10)
GFR calc Af Amer: 79 mL/min — ABNORMAL LOW (ref >90)
GFR calc non Af Amer: 68 mL/min — ABNORMAL LOW (ref >90)
Glucose, Bld: 147 mg/dL — ABNORMAL HIGH (ref 70–99)
Potassium: 3.4 mmol/L — ABNORMAL LOW (ref 3.5–5.1)
Sodium: 140 mmol/L (ref 135–145)

## 2014-09-05 LAB — CBC
HCT: 31.1 % — ABNORMAL LOW (ref 36.0–46.0)
Hemoglobin: 11.1 g/dL — ABNORMAL LOW (ref 12.0–15.0)
MCH: 26.5 pg (ref 26.0–34.0)
MCHC: 35.7 g/dL (ref 30.0–36.0)
MCV: 74.2 fL — ABNORMAL LOW (ref 78.0–100.0)
Platelets: 208 10*3/uL (ref 150–400)
RBC: 4.19 MIL/uL (ref 3.87–5.11)
RDW: 16.1 % — ABNORMAL HIGH (ref 11.5–15.5)
WBC: 12.5 10*3/uL — ABNORMAL HIGH (ref 4.0–10.5)

## 2014-09-05 LAB — INFLUENZA PANEL BY PCR (TYPE A & B)
H1N1 flu by pcr: NOT DETECTED
Influenza A By PCR: NEGATIVE
Influenza B By PCR: NEGATIVE

## 2014-09-05 LAB — MAGNESIUM: Magnesium: 1.9 mg/dL (ref 1.5–2.5)

## 2014-09-05 MED ORDER — ENOXAPARIN SODIUM 30 MG/0.3ML ~~LOC~~ SOLN
30.0000 mg | SUBCUTANEOUS | Status: DC
  Administered 2014-09-05: 30 mg via SUBCUTANEOUS
  Filled 2014-09-05: qty 0.3

## 2014-09-05 MED ORDER — ASPIRIN 81 MG PO CHEW
81.0000 mg | CHEWABLE_TABLET | Freq: Every day | ORAL | Status: DC
  Administered 2014-09-05: 81 mg via ORAL
  Filled 2014-09-05: qty 1

## 2014-09-05 MED ORDER — ACETAMINOPHEN 325 MG PO TABS
650.0000 mg | ORAL_TABLET | Freq: Four times a day (QID) | ORAL | Status: DC | PRN
  Administered 2014-09-05: 650 mg via ORAL
  Filled 2014-09-05: qty 2

## 2014-09-05 MED ORDER — ACETAMINOPHEN 650 MG RE SUPP: 650.0000 mg | Freq: Four times a day (QID) | RECTAL | Status: DC | PRN

## 2014-09-05 MED ORDER — SPIRONOLACTONE 25 MG PO TABS
25.0000 mg | ORAL_TABLET | Freq: Every day | ORAL | Status: DC
  Administered 2014-09-05: 25 mg via ORAL
  Filled 2014-09-05: qty 1

## 2014-09-05 MED ORDER — ISOSORBIDE MONONITRATE ER 60 MG PO TB24
60.0000 mg | ORAL_TABLET | Freq: Every day | ORAL | Status: DC
  Administered 2014-09-05: 60 mg via ORAL
  Filled 2014-09-05: qty 1

## 2014-09-05 MED ORDER — TICAGRELOR 90 MG PO TABS
90.0000 mg | ORAL_TABLET | Freq: Two times a day (BID) | ORAL | Status: DC
  Administered 2014-09-05: 90 mg via ORAL
  Filled 2014-09-05 (×2): qty 1

## 2014-09-05 MED ORDER — AZITHROMYCIN 250 MG PO TABS: ORAL_TABLET | ORAL | Status: DC

## 2014-09-05 MED ORDER — PANTOPRAZOLE SODIUM 40 MG PO TBEC
40.0000 mg | DELAYED_RELEASE_TABLET | Freq: Every day | ORAL | Status: DC
  Administered 2014-09-05: 40 mg via ORAL
  Filled 2014-09-05: qty 1

## 2014-09-05 MED ORDER — HYDROMORPHONE HCL 1 MG/ML IJ SOLN: 0.5000 mg | INTRAMUSCULAR | Status: DC | PRN

## 2014-09-05 MED ORDER — OXYCODONE HCL 5 MG PO TABS: 5.0000 mg | ORAL_TABLET | ORAL | Status: DC | PRN

## 2014-09-05 MED ORDER — CALCIUM CARBONATE-VITAMIN D 500-200 MG-UNIT PO TABS
1.0000 | ORAL_TABLET | Freq: Every day | ORAL | Status: DC
  Administered 2014-09-05: 1 via ORAL
  Filled 2014-09-05: qty 1

## 2014-09-05 MED ORDER — SODIUM CHLORIDE 0.9 % IV SOLN
INTRAVENOUS | Status: DC
  Administered 2014-09-05: 1000 mL via INTRAVENOUS

## 2014-09-05 MED ORDER — NITROGLYCERIN 0.4 MG SL SUBL: 0.4000 mg | SUBLINGUAL_TABLET | SUBLINGUAL | Status: DC | PRN

## 2014-09-05 MED ORDER — GUAIFENESIN-DM 100-10 MG/5ML PO SYRP: 5.0000 mL | ORAL_SOLUTION | ORAL | Status: DC | PRN

## 2014-09-05 MED ORDER — ALUM & MAG HYDROXIDE-SIMETH 200-200-20 MG/5ML PO SUSP: 30.0000 mL | Freq: Four times a day (QID) | ORAL | Status: DC | PRN

## 2014-09-05 MED ORDER — GABAPENTIN 300 MG PO CAPS
300.0000 mg | ORAL_CAPSULE | Freq: Three times a day (TID) | ORAL | Status: DC
  Administered 2014-09-05 (×2): 300 mg via ORAL
  Filled 2014-09-05 (×3): qty 1

## 2014-09-05 MED ORDER — ONDANSETRON HCL 4 MG/2ML IJ SOLN: 4.0000 mg | Freq: Four times a day (QID) | INTRAMUSCULAR | Status: DC | PRN

## 2014-09-05 MED ORDER — POTASSIUM CHLORIDE CRYS ER 20 MEQ PO TBCR
20.0000 meq | EXTENDED_RELEASE_TABLET | Freq: Once | ORAL | Status: AC
  Administered 2014-09-05: 20 meq via ORAL
  Filled 2014-09-05: qty 1

## 2014-09-05 MED ORDER — PNEUMOCOCCAL VAC POLYVALENT 25 MCG/0.5ML IJ INJ
0.5000 mL | INJECTION | INTRAMUSCULAR | Status: AC | PRN
  Administered 2014-09-05: 0.5 mL via INTRAMUSCULAR
  Filled 2014-09-05: qty 0.5

## 2014-09-05 MED ORDER — SODIUM CHLORIDE 0.9 % IJ SOLN
3.0000 mL | Freq: Two times a day (BID) | INTRAMUSCULAR | Status: DC
  Administered 2014-09-05 (×2): 3 mL via INTRAVENOUS

## 2014-09-05 MED ORDER — METOPROLOL SUCCINATE ER 50 MG PO TB24
50.0000 mg | ORAL_TABLET | Freq: Every day | ORAL | Status: DC
  Administered 2014-09-05: 50 mg via ORAL
  Filled 2014-09-05: qty 1

## 2014-09-05 MED ORDER — ENOXAPARIN SODIUM 40 MG/0.4ML ~~LOC~~ SOLN: 40.0000 mg | SUBCUTANEOUS | Status: DC

## 2014-09-05 MED ORDER — CEFDINIR 300 MG PO CAPS: 300.0000 mg | ORAL_CAPSULE | Freq: Two times a day (BID) | ORAL | Status: DC

## 2014-09-05 MED ORDER — ATORVASTATIN CALCIUM 80 MG PO TABS
80.0000 mg | ORAL_TABLET | Freq: Every day | ORAL | Status: DC
  Administered 2014-09-05: 80 mg via ORAL
  Filled 2014-09-05: qty 1

## 2014-09-05 MED ORDER — PNEUMOCOCCAL VAC POLYVALENT 25 MCG/0.5ML IJ INJ: 0.5000 mL | INJECTION | INTRAMUSCULAR | Status: DC

## 2014-09-05 MED ORDER — ONDANSETRON HCL 4 MG PO TABS: 4.0000 mg | ORAL_TABLET | Freq: Four times a day (QID) | ORAL | Status: DC | PRN

## 2014-09-05 NOTE — Progress Notes (Signed)
New Admission Note:   Arrival Method: per stretcher from ED with NT Mental Orientation: alert, oriented X4, pleasant and conversant Telemetry: placed on telebox 08 after CCMD notified Assessment: Completed Skin: warm, dry, intact with no wounds noted. Small brownish discoloration under the left breast noted. IV: G20 on the right hand and left wrist with transparent dressing, clean, dry and intact Pain: denies having any pain as of this time Tubes: IV line, with NS infusing at 55ml/hr Safety Measures: Safety Fall Prevention Plan has been given, discussed and signed Admission: Completed 6 [place] Orientation: Patient has been orientated to the room, unit and staff.  Family: no family member at bedside  Orders have been reviewed and implemented. Will continue to monitor the patient. Call light has been placed within reach and bed alarm has been activated.   Georgeanna Harrison BSN, RN [place] [address] [place]

## 2014-09-05 NOTE — Progress Notes (Signed)
Patient Discharge:  Disposition: Pt discharged home with husband  Education: Pt educated on medications, follow up appointment, and all discharge instructions. Pt verbalized understanding.   IV: Removed  Telemetry: Removed CCMD notified  Follow-up appointments: Schedule follow up appointment with PCP Dr. Ernie Hew for 09/12/14 @ 1:30pm  Prescriptions: Scripts given to pt.  Transportation: Pt transported home by family  Belongings:All belongings taken with pt

## 2014-09-05 NOTE — Discharge Summary (Addendum)
Physician Discharge Summary  Carolyn Rowland OIZ:[id number] DOB: 02-18-1941 DOA: 09/04/2014  PCP: Jerlyn Ly, MD  Admit date: 09/04/2014 Discharge date: 09/05/2014  Time spent: > 35 minutes  Recommendations for Outpatient Follow-up:  Patient recently admitted for community-acquired pneumonia. Improved with Rocephin and azithromycin. Patient had recent diagnosis of right breast carcinoma stage I patient should follow-up with Dr. Elinor Parkinson for lumpectomy  Discharge Diagnoses:  Principal Problem:   CAP (community acquired pneumonia) Active Problems:   Coronary atherosclerosis of native coronary artery   Breast cancer of upper-outer quadrant of right female breast   Leukocytosis   Hypokalemia   Discharge Condition: Stable  Diet recommendation: Heart healthy  Filed Weights   09/04/14 2219  Weight: 97.977 kg (216 lb)    History of present illness:  From original history of present illness: Carolyn Rowland is a 74 y.o. female with recently Diagnosed Rt Breast Ca Stage I Dx( 02/04) 2016 who presents to the ED with complaints of fevers chills and cough which has been productive of greenish sputum x 1 week. She has had loss of appetite, and malaise along with SOB. She was found to have a LLL Pneumonia on chest X-ray and was placed on IV Rocephin and Azithromycin and referred for admission.   Hospital Course:  Community-acquired pneumonia - Patient reports much improvement currently meeting on curb 65 scale a score of 1. Currently she is agreeable with outpatient therapy and reports that she will take her medication as recommended. Patient is to follow-up with her primary care physician within the next one week or sooner should any new concerns arise this was discussed with the patient and she verbalizes agreement. - We'll provide prescriptions for antibiotics on discharge  Right breast cancer stage I -Will have secretary schedule appointment with general surgeon for further evaluation  recommendations  Procedures:  None  Consultations:  None  Discharge Exam: Filed Vitals:   09/05/14 0957  BP: 114/75  Pulse: 97  Temp: 98.1 F (36.7 C)  Resp: 17    General: Patient in no acute distress, alert and awake Cardiovascular: Regular rate and rhythm, no murmurs or rubs Respiratory: Clear to auscultation bilaterally, no wheezes, no increased work of breathing, breathing comfortably on room air, speaking in full sentences  Discharge Instructions   Discharge Instructions    Call MD for:  difficulty breathing, headache or visual disturbances    Complete by:  As directed      Call MD for:  temperature >100.4    Complete by:  As directed      Diet - low sodium heart healthy    Complete by:  As directed      Discharge instructions    Complete by:  As directed   Please follow up with your primary care physician in 1-2 weeks or sooner should any new concerns arise.     Increase activity slowly    Complete by:  As directed           Current Discharge Medication List    START taking these medications   Details  azithromycin (ZITHROMAX) 250 MG tablet Take 1 tablet orally daily Qty: 6 each, Refills: 0    cefdinir (OMNICEF) 300 MG capsule Take 1 capsule (300 mg total) by mouth 2 (two) times daily. Qty: 12 capsule, Refills: 0    guaiFENesin-dextromethorphan (ROBITUSSIN DM) 100-10 MG/5ML syrup Take 5 mLs by mouth every 4 (four) hours as needed for cough. Qty: 118 mL, Refills: 0  CONTINUE these medications which have NOT CHANGED   Details  aspirin 81 MG chewable tablet Chew 1 tablet (81 mg total) by mouth daily.    atorvastatin (LIPITOR) 80 MG tablet Take 1 tablet (80 mg total) by mouth daily. Qty: 30 tablet, Refills: 1    cyclobenzaprine (FLEXERIL) 5 MG tablet Take 5 mg by mouth daily as needed for muscle spasms.  Refills: 0    isosorbide mononitrate (IMDUR) 60 MG 24 hr tablet Take 1 tablet (60 mg total) by mouth daily. Qty: 30 tablet, Refills: 1     metoprolol succinate (TOPROL-XL) 50 MG 24 hr tablet Take 50 mg by mouth daily. Take with or immediately following a meal.    nitroGLYCERIN (NITROSTAT) 0.4 MG SL tablet Place 1 tablet (0.4 mg total) under the tongue every 5 (five) minutes as needed for chest pain. Qty: 30 tablet, Refills: 1    spironolactone (ALDACTONE) 25 MG tablet Take 25 mg by mouth daily.    Ticagrelor (BRILINTA) 90 MG TABS tablet Take 1 tablet (90 mg total) by mouth 2 (two) times daily. Qty: 60 tablet, Refills: 0    traMADol (ULTRAM) 50 MG tablet Take 2 tablets (100 mg total) by mouth every 8 (eight) hours as needed. Qty: 30 tablet, Refills: 0      STOP taking these medications     Calcium Carb-Cholecalciferol (CALCIUM 600 + D PO)      gabapentin (NEURONTIN) 300 MG capsule      omeprazole (PRILOSEC) 20 MG capsule        Allergies  Allergen Reactions  . Penicillins Rash      The results of significant diagnostics from this hospitalization (including imaging, microbiology, ancillary and laboratory) are listed below for reference.    Significant Diagnostic Studies: Mr Breast Bilateral W Wo Contrast  08/20/2014   CLINICAL DATA:  Newly diagnosed invasive mammary carcinoma within the upper-outer right breast. Preoperative MR evaluation.  LABS:  Not performed today.  EXAM: BILATERAL BREAST MRI WITH AND WITHOUT CONTRAST  TECHNIQUE: Multiplanar, multisequence MR images of both breasts were obtained prior to and following the intravenous administration of 20 ml of Multihance.  THREE-DIMENSIONAL MR IMAGE RENDERING ON INDEPENDENT WORKSTATION:  Three-dimensional MR images were rendered by post-processing of the original MR data on an independent workstation. The three-dimensional MR images were interpreted, and findings are reported in the following complete MRI report for this study. Three dimensional images were evaluated at the independent DynaCad workstation  COMPARISON:  Previous mammograms from [facility].  FINDINGS:  Breast composition: c:  Heterogeneous fibroglandular tissue  Background parenchymal enhancement: Moderate. There is some motion artifact which decreases sensitivity.  Right breast: A 1.8 cm area of enhancement at the 11 o'clock position of the right breast (anterior-middle third junction) contains biopsy clip artifact and compatible with post biopsy changes/known neoplasm. No other suspicious areas of enhancement are identified.  Left breast: A 7 x 9 mm circumscribed oval mass at the 9 o'clock position of the left breast is stable mammographically from 2010 - benign. No suspicious areas of enhancement within the left breast identified.  Lymph nodes: No abnormal appearing lymph nodes.  Ancillary findings:  None.  IMPRESSION: 1.8 cm area of enhancement in the upper-outer right breast corresponding to post biopsy changes/known neoplasm.  No evidence of multifocal, multicentric or contralateral malignancy. No evidence of abnormal lymph nodes.  RECOMMENDATION: Treatment plan  BI-RADS CATEGORY  6: Known biopsy-proven malignancy.   Electronically Signed   By: Margarette Canada M.D.   On:  08/20/2014 12:21   Dg Chest Portable 1 View  09/04/2014   CLINICAL DATA:  Productive cough for 1 week with fever.  EXAM: PORTABLE CHEST - 1 VIEW  COMPARISON:  PA and lateral chest 04/07/2007.  FINDINGS: There is focal airspace disease in the left lower lobe lobe. The right lung is clear No pneumothorax or pleural effusion. Heart size is normal.  IMPRESSION: Left lower lobe airspace disease most consistent with pneumonia. Recommend followup to clearing.   Electronically Signed   By: Inge Rise M.D.   On: 09/04/2014 21:44    Microbiology: No results found for this or any previous visit (from the past 240 hour(s)).   Labs: Basic Metabolic Panel:  Recent Labs Lab 09/04/14 2127 09/05/14 0345  NA 137 140  K 3.4* 3.4*  CL 104 108  CO2 24 24  GLUCOSE 159* 147*  BUN 7 6  CREATININE 0.83 0.84  CALCIUM 8.6 7.8*  MG  --  1.9    Liver Function Tests:  Recent Labs Lab 09/04/14 2127  AST 42*  ALT 21  ALKPHOS 81  BILITOT 1.4*  PROT 6.9  ALBUMIN 2.7*   No results for input(s): LIPASE, AMYLASE in the last 168 hours. No results for input(s): AMMONIA in the last 168 hours. CBC:  Recent Labs Lab 09/04/14 2127 09/05/14 0345  WBC 13.9* 12.5*  NEUTROABS 10.7*  --   HGB 12.5 11.1*  HCT 35.0* 31.1*  MCV 75.6* 74.2*  PLT 228 208   Cardiac Enzymes: No results for input(s): CKTOTAL, CKMB, CKMBINDEX, TROPONINI in the last 168 hours. BNP: BNP (last 3 results) No results for input(s): BNP in the last 8760 hours.  ProBNP (last 3 results) No results for input(s): PROBNP in the last 8760 hours.  CBG: No results for input(s): GLUCAP in the last 168 hours.     Signed:  Velvet Bathe  [facility] 09/05/2014, 1:43 PM    Addendum:  Called and informed that patient had positive blood cultures. Discussed with ID and patient should be covered with 3rd generation cephalosporin. Spoke with husband who reports patient feels "much better." Recommend patient f/u with her primary care physician for further evaluation and recommendations. Will assess final results of blood culture results and discuss with family.  Reports that patient is feeling better is reassuring.  Kwame Ryland, [employer]

## 2014-09-06 LAB — URINE CULTURE
Colony Count: >=100000
Special Requests: NORMAL

## 2014-09-07 LAB — CULTURE, BLOOD (ROUTINE X 2)

## 2014-09-08 LAB — CULTURE, BLOOD (ROUTINE X 2)

## 2014-09-09 ENCOUNTER — Telehealth: Payer: Self-pay | Admitting: *Deleted

## 2014-09-09 DIAGNOSIS — J189 Pneumonia, unspecified organism: Secondary | ICD-10-CM | POA: Diagnosis not present

## 2014-09-09 DIAGNOSIS — C50919 Malignant neoplasm of unspecified site of unspecified female breast: Secondary | ICD-10-CM | POA: Diagnosis not present

## 2014-09-09 NOTE — Telephone Encounter (Signed)
Called pt to f/u and discuss care plan summary. Pt denies questions or concerns regarding dx or treatment care plan. Pt is currently recovering from pneumonia. Once in full recovery surgery will be scheduled. Encourage pt to call with needs. Received verbal understanding.

## 2014-09-10 ENCOUNTER — Telehealth: Payer: Self-pay

## 2014-09-10 NOTE — Telephone Encounter (Signed)
-----   Message from Jeralyn Ruths, [place] sent at 09/05/2014  1:48 PM EST ----- Regarding: RE: Curgery/cardiac clearance Hi Makari Portman,  According to Dr. Zella Richer the patient has been cleared by Dr. Daneen Schick.  I am still waiting for confirmation of documentation, but pt is ok for surgery.  Jearld Fenton ----- Message -----    From: Prentiss Bells, RN    Sent: 09/04/2014   4:21 PM      To: Jeralyn Ruths, CMA Subject: Curgery/cardiac clearance                      Hi Bernie.  I have a note on my desk pt needs cardiac clearance prior to surgery.  I don't see an appt on her chart to see cardiologist.  Just checking on status.  Thanks!  Tyran Huser

## 2014-09-19 ENCOUNTER — Other Ambulatory Visit: Payer: Self-pay | Admitting: Family Medicine

## 2014-09-19 ENCOUNTER — Ambulatory Visit
Admission: RE | Admit: 2014-09-19 | Discharge: 2014-09-19 | Disposition: A | Discharge status: Home / Self Care | Payer: Commercial Managed Care - HMO | Source: Ambulatory Visit | Attending: Family Medicine | Admitting: Family Medicine

## 2014-09-19 DIAGNOSIS — I2510 Atherosclerotic heart disease of native coronary artery without angina pectoris: Secondary | ICD-10-CM | POA: Diagnosis not present

## 2014-09-19 DIAGNOSIS — R0602 Shortness of breath: Secondary | ICD-10-CM | POA: Diagnosis not present

## 2014-09-19 DIAGNOSIS — J189 Pneumonia, unspecified organism: Secondary | ICD-10-CM

## 2014-09-19 DIAGNOSIS — N63 Unspecified lump in breast: Secondary | ICD-10-CM | POA: Diagnosis not present

## 2014-09-20 DIAGNOSIS — Z01810 Encounter for preprocedural cardiovascular examination: Secondary | ICD-10-CM | POA: Diagnosis not present

## 2014-09-20 DIAGNOSIS — K219 Gastro-esophageal reflux disease without esophagitis: Secondary | ICD-10-CM | POA: Diagnosis not present

## 2014-09-20 DIAGNOSIS — I10 Essential (primary) hypertension: Secondary | ICD-10-CM | POA: Diagnosis not present

## 2014-09-20 DIAGNOSIS — I25119 Atherosclerotic heart disease of native coronary artery with unspecified angina pectoris: Secondary | ICD-10-CM | POA: Diagnosis not present

## 2014-09-23 DIAGNOSIS — K219 Gastro-esophageal reflux disease without esophagitis: Secondary | ICD-10-CM | POA: Diagnosis not present

## 2014-09-23 DIAGNOSIS — M5416 Radiculopathy, lumbar region: Secondary | ICD-10-CM | POA: Diagnosis not present

## 2014-09-23 DIAGNOSIS — I2510 Atherosclerotic heart disease of native coronary artery without angina pectoris: Secondary | ICD-10-CM | POA: Diagnosis not present

## 2014-09-23 DIAGNOSIS — J189 Pneumonia, unspecified organism: Secondary | ICD-10-CM | POA: Diagnosis not present

## 2014-09-24 ENCOUNTER — Other Ambulatory Visit: Payer: Self-pay | Admitting: Family Medicine

## 2014-09-24 ENCOUNTER — Ambulatory Visit
Admission: RE | Admit: 2014-09-24 | Discharge: 2014-09-24 | Disposition: A | Discharge status: Home / Self Care | Payer: Commercial Managed Care - HMO | Source: Ambulatory Visit | Attending: Family Medicine | Admitting: Family Medicine

## 2014-09-24 DIAGNOSIS — R0602 Shortness of breath: Secondary | ICD-10-CM | POA: Diagnosis not present

## 2014-09-24 DIAGNOSIS — J189 Pneumonia, unspecified organism: Secondary | ICD-10-CM

## 2014-09-25 DIAGNOSIS — J189 Pneumonia, unspecified organism: Secondary | ICD-10-CM | POA: Diagnosis not present

## 2014-09-25 DIAGNOSIS — M5416 Radiculopathy, lumbar region: Secondary | ICD-10-CM | POA: Diagnosis not present

## 2014-09-25 DIAGNOSIS — R011 Cardiac murmur, unspecified: Secondary | ICD-10-CM | POA: Diagnosis not present

## 2014-09-25 DIAGNOSIS — I2510 Atherosclerotic heart disease of native coronary artery without angina pectoris: Secondary | ICD-10-CM | POA: Diagnosis not present

## 2014-09-28 ENCOUNTER — Emergency Department (INDEPENDENT_AMBULATORY_CARE_PROVIDER_SITE_OTHER)
Admission: EM | Admit: 2014-09-28 | Discharge: 2014-09-28 | Disposition: A | Discharge status: Home / Self Care | Payer: Commercial Managed Care - HMO | Source: Home / Self Care | Attending: Emergency Medicine | Admitting: Emergency Medicine

## 2014-09-28 ENCOUNTER — Encounter (HOSPITAL_COMMUNITY): Payer: Self-pay

## 2014-09-28 DIAGNOSIS — M48062 Spinal stenosis, lumbar region with neurogenic claudication: Secondary | ICD-10-CM

## 2014-09-28 DIAGNOSIS — G9519 Other vascular myelopathies: Secondary | ICD-10-CM

## 2014-09-28 DIAGNOSIS — M4806 Spinal stenosis, lumbar region: Secondary | ICD-10-CM

## 2014-09-28 HISTORY — DX: Pneumonia, unspecified organism: J18.9

## 2014-09-28 MED ORDER — PREDNISONE 50 MG PO TABS: ORAL_TABLET | ORAL | Status: DC

## 2014-09-28 MED ORDER — HYDROCODONE-ACETAMINOPHEN 5-325 MG PO TABS: 1.0000 | ORAL_TABLET | Freq: Four times a day (QID) | ORAL | Status: DC | PRN

## 2014-09-28 MED ORDER — GABAPENTIN 300 MG PO CAPS: 300.0000 mg | ORAL_CAPSULE | Freq: Every day | ORAL | Status: DC

## 2014-09-28 NOTE — ED Notes (Signed)
Has reported history of spinal stenosis and pneumonia. C/o her leg has been hurting x past 3 months, and cant get relief.

## 2014-09-28 NOTE — ED Provider Notes (Signed)
CSN: [id number]     Arrival date & time 09/28/14  1254 History   First MD Initiated Contact with Patient 09/28/14 1321     Chief Complaint  Patient presents with  . Leg Pain   (Consider location/radiation/quality/duration/timing/severity/associated sxs/prior Treatment) HPI  She is a 74 year old woman here for evaluation of right leg pain. She states her symptoms have been going on for several months, but worsened over the last few days. The pain is in her right thigh. It is worse with going from sitting to standing and prolonged sitting. She will get some shooting pains down to her foot. She states the thigh feels swollen. She denies weakness. No bowel or bladder incontinence. No fevers. She has a known diagnosis of spinal stenosis. This is followed by Dr. Joya Salm who would like to do surgery, but is waiting until after her breast cancer surgery.  Past Medical History  Diagnosis Date  . Heart murmur   . Hypertension   . High cholesterol   . Pneumonia 1970's    "once"  . H/O hiatal hernia   . GERD (gastroesophageal reflux disease)   . Arthritis     "hands; legs" (08/17/2013)  . CAP (community acquired pneumonia)    Past Surgical History  Procedure Laterality Date  . Cardiac catheterization  08/07/2013  . Coronary angioplasty with stent placement  08/17/2013    "6 stents"(08/17/2013)  . Appendectomy  1960  . Abdominal hysterectomy  1985  . Tubal ligation  1980  . Left and right heart catheterization with coronary angiogram N/A 08/07/2013    Procedure: LEFT AND RIGHT HEART CATHETERIZATION WITH CORONARY ANGIOGRAM;  Surgeon: Laverda Page, MD;  Location: [facility] CATH LAB;  Service: Cardiovascular;  Laterality: N/A;  . Percutaneous coronary stent intervention (pci-s) N/A 08/17/2013    Procedure: PERCUTANEOUS CORONARY STENT INTERVENTION (PCI-S);  Surgeon: Laverda Page, MD;  Location: [facility] CATH LAB;  Service: Cardiovascular;  Laterality: N/A;   Family History  Problem Relation Age of Onset  .  Cancer Brother   . Cancer Brother   . Heart attack Brother    History  Substance Use Topics  . Smoking status: Former Smoker -- 1.00 packs/day for 52 years    Types: Cigarettes    Quit date: 04/04/2014  . Smokeless tobacco: Never Used  . Alcohol Use: No     Comment: 08/17/2013 "quit drinking at age 49; never did drink much"   OB History    No data available     Review of Systems As in history of present illness Allergies  Penicillins  Home Medications   Prior to Admission medications   Medication Sig Start Date End Date Taking? Authorizing Provider  aspirin 81 MG chewable tablet Chew 1 tablet (81 mg total) by mouth daily. 08/19/13   Adrian Prows, MD  atorvastatin (LIPITOR) 80 MG tablet Take 1 tablet (80 mg total) by mouth daily. 08/07/13   Adrian Prows, MD  azithromycin (ZITHROMAX) 250 MG tablet Take 1 tablet orally daily 09/05/14   Velvet Bathe, MD  cefdinir (OMNICEF) 300 MG capsule Take 1 capsule (300 mg total) by mouth 2 (two) times daily. 09/05/14   Velvet Bathe, MD  cyclobenzaprine (FLEXERIL) 5 MG tablet Take 5 mg by mouth daily as needed for muscle spasms.  07/17/14   Historical Provider, MD  gabapentin (NEURONTIN) 300 MG capsule Take 1 capsule (300 mg total) by mouth at bedtime. 09/28/14   Melony Overly, MD  guaiFENesin-dextromethorphan (ROBITUSSIN DM) 100-10 MG/5ML syrup Take 5 mLs  by mouth every 4 (four) hours as needed for cough. 09/05/14   Velvet Bathe, MD  HYDROcodone-acetaminophen (NORCO) 5-325 MG per tablet Take 1 tablet by mouth every 6 (six) hours as needed for moderate pain. 09/28/14   Melony Overly, MD  isosorbide mononitrate (IMDUR) 60 MG 24 hr tablet Take 1 tablet (60 mg total) by mouth daily. 08/07/13   Adrian Prows, MD  metoprolol succinate (TOPROL-XL) 50 MG 24 hr tablet Take 50 mg by mouth daily. Take with or immediately following a meal.    Historical Provider, MD  nitroGLYCERIN (NITROSTAT) 0.4 MG SL tablet Place 1 tablet (0.4 mg total) under the tongue every 5 (five) minutes as  needed for chest pain. 08/07/13   Adrian Prows, MD  predniSONE (DELTASONE) 50 MG tablet Take 1 pill daily for 5 days. 09/28/14   Melony Overly, MD  spironolactone (ALDACTONE) 25 MG tablet Take 25 mg by mouth daily.    Historical Provider, MD  Ticagrelor (BRILINTA) 90 MG TABS tablet Take 1 tablet (90 mg total) by mouth 2 (two) times daily. 08/19/13   Adrian Prows, MD  traMADol (ULTRAM) 50 MG tablet Take 2 tablets (100 mg total) by mouth every 8 (eight) hours as needed. Patient taking differently: Take 100 mg by mouth every 8 (eight) hours as needed for moderate pain.  06/03/14   Harden Mo, MD   BP 125/56 mmHg  Pulse 87  Temp(Src) 97.7 F (36.5 C) (Oral)  Resp 20  SpO2 99% Physical Exam  Constitutional: She is oriented to person, place, and time. She appears well-nourished. She appears distressed (looks uncomfortable).  Cardiovascular: Normal rate.   Pulmonary/Chest: Effort normal.  Musculoskeletal:  No obvious swelling in the right thigh. No pitting edema. She has 2+ DP pulse on the right. No point tenderness. She has pain with right hip flexion which limits strength testing. She has 2+ patellar reflex on the right compared to 1+ on the left.  Neurological: She is alert and oriented to person, place, and time.    ED Course  Procedures (including critical care time) Labs Review Labs Reviewed - No data to display  Imaging Review No results found.   MDM   1. Neurogenic claudication    We'll treat with a prednisone burst. We'll also start gabapentin 300 mg at night. Given her cardiac history, will provide some Norco instead of NSAIDs for acute pain relief. She will follow-up with Dr. Joya Salm as soon as possible. Return precautions reviewed as in after visit summary.    Melony Overly, MD 09/28/14 1350

## 2014-09-28 NOTE — Discharge Instructions (Signed)
Your leg pain is likely coming from your spinal stenosis. Take prednisone daily for 5 days. Take gabapentin 1 pill at bedtime. This will make you sleepy. Use the Norco every 4-6 hours as needed for severe pain. This may also make you tired. Please follow-up with Dr. Joya Salm as soon as possible. If you develop weakness in the leg or trouble controlling your bowel or bladder, please go to the emergency room.

## 2014-10-07 ENCOUNTER — Other Ambulatory Visit: Payer: Self-pay | Admitting: General Surgery

## 2014-10-07 DIAGNOSIS — C50911 Malignant neoplasm of unspecified site of right female breast: Secondary | ICD-10-CM

## 2014-10-10 ENCOUNTER — Telehealth: Payer: Self-pay | Admitting: Hematology and Oncology

## 2014-10-10 ENCOUNTER — Other Ambulatory Visit: Payer: Self-pay | Admitting: *Deleted

## 2014-10-10 NOTE — Telephone Encounter (Signed)
Pt confirmed MD visit per 04/07 POF. Pt request schedule being mailed, mailed schedule to pt... KJ

## 2014-10-11 ENCOUNTER — Other Ambulatory Visit: Payer: Self-pay

## 2014-10-16 ENCOUNTER — Encounter (HOSPITAL_COMMUNITY): Payer: Self-pay

## 2014-10-16 ENCOUNTER — Encounter (HOSPITAL_COMMUNITY)
Admission: RE | Admit: 2014-10-16 | Discharge: 2014-10-16 | Disposition: A | Discharge status: Home / Self Care | Payer: Commercial Managed Care - HMO | Source: Ambulatory Visit | Attending: General Surgery | Admitting: General Surgery

## 2014-10-16 DIAGNOSIS — I10 Essential (primary) hypertension: Secondary | ICD-10-CM | POA: Diagnosis not present

## 2014-10-16 DIAGNOSIS — Z87891 Personal history of nicotine dependence: Secondary | ICD-10-CM | POA: Diagnosis not present

## 2014-10-16 DIAGNOSIS — Z170 Estrogen receptor positive status [ER+]: Secondary | ICD-10-CM | POA: Diagnosis not present

## 2014-10-16 DIAGNOSIS — I2510 Atherosclerotic heart disease of native coronary artery without angina pectoris: Secondary | ICD-10-CM | POA: Diagnosis not present

## 2014-10-16 DIAGNOSIS — C50911 Malignant neoplasm of unspecified site of right female breast: Secondary | ICD-10-CM | POA: Diagnosis not present

## 2014-10-16 DIAGNOSIS — Z880 Allergy status to penicillin: Secondary | ICD-10-CM | POA: Diagnosis not present

## 2014-10-16 DIAGNOSIS — Z79899 Other long term (current) drug therapy: Secondary | ICD-10-CM | POA: Diagnosis not present

## 2014-10-16 DIAGNOSIS — K219 Gastro-esophageal reflux disease without esophagitis: Secondary | ICD-10-CM | POA: Diagnosis not present

## 2014-10-16 HISTORY — DX: Nonrheumatic aortic (valve) stenosis: I35.0

## 2014-10-16 LAB — COMPREHENSIVE METABOLIC PANEL
ALT: 14 U/L (ref 0–35)
AST: 20 U/L (ref 0–37)
Albumin: 3.5 g/dL (ref 3.5–5.2)
Alkaline Phosphatase: 54 U/L (ref 39–117)
Anion gap: 7 (ref 5–15)
BUN: 15 mg/dL (ref 6–23)
CO2: 27 mmol/L (ref 19–32)
Calcium: 9.2 mg/dL (ref 8.4–10.5)
Chloride: 106 mmol/L (ref 96–112)
Creatinine, Ser: 0.86 mg/dL (ref 0.50–1.10)
GFR calc Af Amer: 77 mL/min — ABNORMAL LOW (ref >90)
GFR calc non Af Amer: 66 mL/min — ABNORMAL LOW (ref >90)
Glucose, Bld: 109 mg/dL — ABNORMAL HIGH (ref 70–99)
Potassium: 4.4 mmol/L (ref 3.5–5.1)
Sodium: 140 mmol/L (ref 135–145)
Total Bilirubin: 0.7 mg/dL (ref 0.3–1.2)
Total Protein: 7 g/dL (ref 6.0–8.3)

## 2014-10-16 LAB — CBC WITH DIFFERENTIAL/PLATELET
Basophils Absolute: 0 10*3/uL (ref 0.0–0.1)
Basophils Relative: 1 % (ref 0–1)
Eosinophils Absolute: 0.1 10*3/uL (ref 0.0–0.7)
Eosinophils Relative: 1 % (ref 0–5)
HCT: 39.8 % (ref 36.0–46.0)
Hemoglobin: 13.4 g/dL (ref 12.0–15.0)
Lymphocytes Relative: 40 % (ref 12–46)
Lymphs Abs: 2 10*3/uL (ref 0.7–4.0)
MCH: 26.8 pg (ref 26.0–34.0)
MCHC: 33.7 g/dL (ref 30.0–36.0)
MCV: 79.6 fL (ref 78.0–100.0)
Monocytes Absolute: 0.3 10*3/uL (ref 0.1–1.0)
Monocytes Relative: 6 % (ref 3–12)
Neutro Abs: 2.6 10*3/uL (ref 1.7–7.7)
Neutrophils Relative %: 52 % (ref 43–77)
Platelets: 195 10*3/uL (ref 150–400)
RBC: 5 MIL/uL (ref 3.87–5.11)
RDW: 17.1 % — ABNORMAL HIGH (ref 11.5–15.5)
WBC: 5 10*3/uL (ref 4.0–10.5)

## 2014-10-16 LAB — PROTIME-INR
INR: 1.05 (ref 0.00–1.49)
Prothrombin Time: 13.8 seconds (ref 11.6–15.2)

## 2014-10-16 MED ORDER — VANCOMYCIN HCL IN DEXTROSE 1-5 GM/200ML-% IV SOLN
1000.0000 mg | INTRAVENOUS | Status: AC
  Administered 2014-10-17: 1000 mg via INTRAVENOUS
  Filled 2014-10-16: qty 200

## 2014-10-16 NOTE — Progress Notes (Addendum)
Dr. Einar Gip is aware of her upcoming surgery and she is currently on Brylinta, and she has been instructed to stop drug 7 days out.  Stents placed in Feb 2015 by Dr. Einar Gip.   Had pneumonia back in March.  Better now.   Had heart murmur back in the 80's and having no symptoms now. She has to go to the [facility] @ 12 and will be over as soon as she's done.  DA Have requested a cardiac clearance note from Dr. Irven Shelling office.  DA

## 2014-10-16 NOTE — Pre-Procedure Instructions (Signed)
Carolyn Rowland  [birth year]   Your procedure is scheduled on:  Aprill 14th, Thursday   Report to [facility] Admitting at 1:00 PM  Call this number if you have problems the morning of surgery: [phone]   Remember:   Do not eat food or drink liquids after midnight Wednesday.   Take these medicines the morning of surgery with A SIP OF WATER: Imdur, Metoprolol, Omeprazole, pain medication   Do not wear jewelry, make-up or nail polish.  Do not wear lotions, powders, or perfumes. You may NOT wear deodorant the day of surgery.  Do not shave underarms & legs 48 hours prior to surgery.    Do not bring valuables to the hospital.  [facility] is not responsible for any belongings or valuables.               Contacts, dentures or bridgework may not be worn into surgery.  Leave suitcase in the car. After surgery it may be brought to your room.  For patients admitted to the hospital, discharge time is determined by your treatment team.               Patients discharged the day of surgery will not be allowed to drive home.   Name and phone number of your driver:    Special Instructions: "Preparing for Surgery" instruction sheet.   Please read over the following fact sheets that you were given: Pain Booklet and Surgical Site Infection Prevention

## 2014-10-16 NOTE — Progress Notes (Signed)
Anesthesia Chart Review:  Patient is a 74 year old female scheduled for right breast lumpectomy with needle localization and axillary SN biopsy on 10/17/14 by Dr. Zella Richer.  History includes former smoker, murmur (mild to moderate AS), CAD s/p CX DES X 4 and RCA DES 08/2013, HTN, hypercholesterolemia, GERD, hiatal hernia, arthritis, 09/04/14 PNA. BMI is consistent with obesity. Cardiologist is Dr. Einar Gip.  She was seen by Neldon Labella, NP-C with Dr. Einar Gip on 09/20/14 and cleared with low peri-operative CV risk with permission to hold Brilinta for 5 days prior to surgery. PCP is listed as Dr. Rachell Cipro.  09/04/14 EKG: ST at 100 bpm, biatrial enlargement.   08/17/13 Cardiac cath: Angiographic data: Right coronary artery: it's a dominant vessel, is occluded in the midsegment, faint ipsilateral collateral filling of the distal vessel is evident. Total occlusion occurs just proximal to the bifurcation of PLV and PDA branches. The PDA and PL branches are collateralized by the left system.  Left main coronary artery: Normal. No stenosis.  LAD: Large.Krystal Clark origin to a moderate sized diagonal 1, D2 Which have mild ostial disease. The LAD itself as mild noncritical coronary artery disease in the proximal and midsegment, mild calcification is also evident. Distal LAD near the apex has a eccentric 80-90% stenosis.  Circumflex coronary artery: It is codominant with the right coronary artery. It s a very large vessel. Proximal circumflex has tandem 90-95% stenosis followed by ectasia in both poststenotic areas. This is followed by origin of a large OM1 with ostial 80% stenosis. OM2 is small with 40% stenosis. At the bifurcation of circumflex and OM1 there is a 70-80% stenosis followed by a distal circumflex which has a 90% stenosis. Procedure performed: 08/17/2013: Stenting of Proximal and distal circumflex with 4 DES stents. 3.0x12, 3.0x16 promus Premier drug-eluting stents sandwiched in proximal circumflex,  mid 2.5x18 Xience Alpine DES and distal 2.5x16 mm promus Premier DES. Successful Stenting of CTO RCA. Mid to distal RCA 3.0x38 and proximal 3.0x28 mm DES.  08/01/13 Echo: LV cavity is normal in size. Mild to moderate concentric LVH. Moderate septal notching. Normal diastolic filling. Normal global wall motion. Normal systolic global function. Calculated EF 64%. Mildly to moderately dilated LA. Moderate AV thickening with mild calcification. AV leaflet mobility is moderately restricted. Mild to moderate AS. AV mean gradient of 12.7 mmHg. Calculated AVA 0.89 cm2. Mild to moderate MR. Mild TR. No pulmonary HTN.   09/24/14 CXR IMPRESSION: Minimal persistent increased density in the left lower lobe consistent with residual atelectasis or pneumonia. An additional follow-up radiograph in 2-3 weeks is recommended assuming the patient is continuing to improve clinically. If the patient's clinical status is deteriorating, chest CT scanning would be recommended.  Preoperative labs noted.   She will be further evaluated by her anesthesiologist on the day of surgery.  If no acute cardiopulmonary issues then I would anticipate that she could proceed as planned.  George Hugh [facility] Short Stay Center/Anesthesiology Phone [phone] 10/16/2014 3:33 PM

## 2014-10-17 ENCOUNTER — Ambulatory Visit (HOSPITAL_COMMUNITY): Payer: Commercial Managed Care - HMO | Admitting: Vascular Surgery

## 2014-10-17 ENCOUNTER — Encounter (HOSPITAL_COMMUNITY)
Admission: RE | Admit: 2014-10-17 | Discharge: 2014-10-17 | Disposition: A | Discharge status: Home / Self Care | Payer: Commercial Managed Care - HMO | Source: Ambulatory Visit | Attending: General Surgery | Admitting: General Surgery

## 2014-10-17 ENCOUNTER — Encounter (HOSPITAL_COMMUNITY): Payer: Self-pay | Admitting: *Deleted

## 2014-10-17 ENCOUNTER — Encounter (HOSPITAL_COMMUNITY)
Admission: RE | Disposition: A | Discharge status: Home / Self Care | Payer: Self-pay | Source: Ambulatory Visit | Attending: General Surgery

## 2014-10-17 ENCOUNTER — Ambulatory Visit (HOSPITAL_COMMUNITY): Payer: Commercial Managed Care - HMO | Admitting: Anesthesiology

## 2014-10-17 ENCOUNTER — Observation Stay (HOSPITAL_COMMUNITY)
Admission: RE | Admit: 2014-10-17 | Discharge: 2014-10-18 | Disposition: A | Discharge status: Home / Self Care | Payer: Commercial Managed Care - HMO | Source: Ambulatory Visit | Attending: General Surgery | Admitting: General Surgery

## 2014-10-17 DIAGNOSIS — Z880 Allergy status to penicillin: Secondary | ICD-10-CM | POA: Insufficient documentation

## 2014-10-17 DIAGNOSIS — I2510 Atherosclerotic heart disease of native coronary artery without angina pectoris: Secondary | ICD-10-CM | POA: Diagnosis not present

## 2014-10-17 DIAGNOSIS — I10 Essential (primary) hypertension: Secondary | ICD-10-CM | POA: Insufficient documentation

## 2014-10-17 DIAGNOSIS — C50911 Malignant neoplasm of unspecified site of right female breast: Secondary | ICD-10-CM | POA: Diagnosis not present

## 2014-10-17 DIAGNOSIS — K219 Gastro-esophageal reflux disease without esophagitis: Secondary | ICD-10-CM | POA: Insufficient documentation

## 2014-10-17 DIAGNOSIS — Z79899 Other long term (current) drug therapy: Secondary | ICD-10-CM | POA: Diagnosis not present

## 2014-10-17 DIAGNOSIS — Z87891 Personal history of nicotine dependence: Secondary | ICD-10-CM | POA: Diagnosis not present

## 2014-10-17 DIAGNOSIS — Z170 Estrogen receptor positive status [ER+]: Secondary | ICD-10-CM | POA: Insufficient documentation

## 2014-10-17 DIAGNOSIS — R920 Mammographic microcalcification found on diagnostic imaging of breast: Secondary | ICD-10-CM | POA: Diagnosis not present

## 2014-10-17 DIAGNOSIS — C50919 Malignant neoplasm of unspecified site of unspecified female breast: Secondary | ICD-10-CM | POA: Diagnosis present

## 2014-10-17 DIAGNOSIS — C50411 Malignant neoplasm of upper-outer quadrant of right female breast: Secondary | ICD-10-CM | POA: Diagnosis present

## 2014-10-17 DIAGNOSIS — Z0000 Encounter for general adult medical examination without abnormal findings: Secondary | ICD-10-CM | POA: Diagnosis not present

## 2014-10-17 DIAGNOSIS — G8918 Other acute postprocedural pain: Secondary | ICD-10-CM | POA: Diagnosis not present

## 2014-10-17 HISTORY — PX: BREAST LUMPECTOMY WITH NEEDLE LOCALIZATION AND AXILLARY SENTINEL LYMPH NODE BX: SHX5760

## 2014-10-17 SURGERY — BREAST LUMPECTOMY WITH NEEDLE LOCALIZATION AND AXILLARY SENTINEL LYMPH NODE BX
Anesthesia: General | Site: Breast | Laterality: Right

## 2014-10-17 MED ORDER — FENTANYL CITRATE 0.05 MG/ML IJ SOLN
INTRAMUSCULAR | Status: AC
  Administered 2014-10-17: 100 ug via INTRAVENOUS
  Filled 2014-10-17: qty 2

## 2014-10-17 MED ORDER — BUPIVACAINE HCL (PF) 0.25 % IJ SOLN
INTRAMUSCULAR | Status: DC | PRN
  Administered 2014-10-17: 3 mL

## 2014-10-17 MED ORDER — MIDAZOLAM HCL 2 MG/2ML IJ SOLN
INTRAMUSCULAR | Status: AC
  Filled 2014-10-17: qty 2

## 2014-10-17 MED ORDER — PROPOFOL 10 MG/ML IV BOLUS
INTRAVENOUS | Status: DC | PRN
  Administered 2014-10-17: 160 mg via INTRAVENOUS

## 2014-10-17 MED ORDER — HYDROMORPHONE HCL 1 MG/ML IJ SOLN
INTRAMUSCULAR | Status: AC
  Filled 2014-10-17: qty 1

## 2014-10-17 MED ORDER — PROPOFOL 10 MG/ML IV BOLUS
INTRAVENOUS | Status: AC
  Filled 2014-10-17: qty 20

## 2014-10-17 MED ORDER — FENTANYL CITRATE 0.05 MG/ML IJ SOLN
INTRAMUSCULAR | Status: AC
  Filled 2014-10-17: qty 5

## 2014-10-17 MED ORDER — LACTATED RINGERS IV SOLN
INTRAVENOUS | Status: DC
  Administered 2014-10-17 (×2): via INTRAVENOUS

## 2014-10-17 MED ORDER — PANTOPRAZOLE SODIUM 40 MG PO TBEC
40.0000 mg | DELAYED_RELEASE_TABLET | Freq: Every day | ORAL | Status: DC
  Administered 2014-10-18: 40 mg via ORAL
  Filled 2014-10-17: qty 1

## 2014-10-17 MED ORDER — LIDOCAINE HCL (CARDIAC) 20 MG/ML IV SOLN
INTRAVENOUS | Status: DC | PRN
  Administered 2014-10-17: 50 mg via INTRAVENOUS

## 2014-10-17 MED ORDER — HYDROCODONE-ACETAMINOPHEN 5-325 MG PO TABS: 1.0000 | ORAL_TABLET | ORAL | Status: DC | PRN

## 2014-10-17 MED ORDER — ASPIRIN 81 MG PO CHEW
81.0000 mg | CHEWABLE_TABLET | Freq: Every day | ORAL | Status: DC
  Administered 2014-10-18: 81 mg via ORAL
  Filled 2014-10-17: qty 1

## 2014-10-17 MED ORDER — DEXAMETHASONE SODIUM PHOSPHATE 10 MG/ML IJ SOLN
INTRAMUSCULAR | Status: DC | PRN
  Administered 2014-10-17: 10 mg via INTRAVENOUS

## 2014-10-17 MED ORDER — METOPROLOL SUCCINATE ER 50 MG PO TB24
50.0000 mg | ORAL_TABLET | Freq: Every day | ORAL | Status: DC
  Administered 2014-10-18: 50 mg via ORAL
  Filled 2014-10-17: qty 1

## 2014-10-17 MED ORDER — ONDANSETRON HCL 4 MG PO TABS: 4.0000 mg | ORAL_TABLET | Freq: Four times a day (QID) | ORAL | Status: DC | PRN

## 2014-10-17 MED ORDER — OXYCODONE HCL 5 MG/5ML PO SOLN: 5.0000 mg | Freq: Once | ORAL | Status: DC | PRN

## 2014-10-17 MED ORDER — PHENYLEPHRINE HCL 10 MG/ML IJ SOLN
INTRAMUSCULAR | Status: DC | PRN
  Administered 2014-10-17 (×8): 40 ug via INTRAVENOUS

## 2014-10-17 MED ORDER — MORPHINE SULFATE 2 MG/ML IJ SOLN: 2.0000 mg | INTRAMUSCULAR | Status: DC | PRN

## 2014-10-17 MED ORDER — PROMETHAZINE HCL 25 MG/ML IJ SOLN
6.2500 mg | INTRAMUSCULAR | Status: AC | PRN
Start: 2014-10-17 — End: 2014-10-17
  Administered 2014-10-17 (×2): 6.25 mg via INTRAVENOUS

## 2014-10-17 MED ORDER — OXYCODONE HCL 5 MG PO TABS: 5.0000 mg | ORAL_TABLET | Freq: Once | ORAL | Status: DC | PRN

## 2014-10-17 MED ORDER — MIDAZOLAM HCL 5 MG/5ML IJ SOLN
INTRAMUSCULAR | Status: DC | PRN
  Administered 2014-10-17: 1 mg via INTRAVENOUS

## 2014-10-17 MED ORDER — HYDROMORPHONE HCL 1 MG/ML IJ SOLN
INTRAMUSCULAR | Status: AC
Start: 2014-10-17 — End: 2014-10-18
  Filled 2014-10-17: qty 1

## 2014-10-17 MED ORDER — LIDOCAINE HCL (CARDIAC) 20 MG/ML IV SOLN
INTRAVENOUS | Status: AC
  Filled 2014-10-17: qty 5

## 2014-10-17 MED ORDER — HYDROCODONE-ACETAMINOPHEN 5-325 MG PO TABS
1.0000 | ORAL_TABLET | ORAL | Status: DC | PRN
  Administered 2014-10-18: 1 via ORAL
  Filled 2014-10-17: qty 2

## 2014-10-17 MED ORDER — ONDANSETRON HCL 4 MG/2ML IJ SOLN
INTRAMUSCULAR | Status: DC | PRN
  Administered 2014-10-17: 4 mg via INTRAVENOUS

## 2014-10-17 MED ORDER — FENTANYL CITRATE 0.05 MG/ML IJ SOLN
50.0000 ug | INTRAMUSCULAR | Status: DC | PRN
  Administered 2014-10-17 (×2): 50 ug via INTRAVENOUS
  Administered 2014-10-17: 100 ug via INTRAVENOUS
  Administered 2014-10-17: 50 ug via INTRAVENOUS

## 2014-10-17 MED ORDER — METHYLENE BLUE 1 % INJ SOLN
INTRAMUSCULAR | Status: AC
  Filled 2014-10-17: qty 10

## 2014-10-17 MED ORDER — ONDANSETRON HCL 4 MG/2ML IJ SOLN: 4.0000 mg | Freq: Four times a day (QID) | INTRAMUSCULAR | Status: DC | PRN

## 2014-10-17 MED ORDER — PROMETHAZINE HCL 25 MG/ML IJ SOLN
INTRAMUSCULAR | Status: AC
  Administered 2014-10-17: 6.25 mg via INTRAVENOUS
  Filled 2014-10-17: qty 1

## 2014-10-17 MED ORDER — ISOSORBIDE MONONITRATE ER 60 MG PO TB24
60.0000 mg | ORAL_TABLET | Freq: Every day | ORAL | Status: DC
  Administered 2014-10-18: 60 mg via ORAL
  Filled 2014-10-17: qty 1

## 2014-10-17 MED ORDER — ACETAMINOPHEN 325 MG PO TABS
650.0000 mg | ORAL_TABLET | ORAL | Status: DC | PRN
  Filled 2014-10-17: qty 2

## 2014-10-17 MED ORDER — BUPIVACAINE HCL (PF) 0.25 % IJ SOLN
INTRAMUSCULAR | Status: AC
  Filled 2014-10-17: qty 30

## 2014-10-17 MED ORDER — DEXAMETHASONE SODIUM PHOSPHATE 10 MG/ML IJ SOLN
INTRAMUSCULAR | Status: AC
  Filled 2014-10-17: qty 1

## 2014-10-17 MED ORDER — OXYCODONE HCL 5 MG PO TABS: 5.0000 mg | ORAL_TABLET | ORAL | Status: DC | PRN

## 2014-10-17 MED ORDER — GABAPENTIN 300 MG PO CAPS
300.0000 mg | ORAL_CAPSULE | Freq: Every day | ORAL | Status: DC
  Administered 2014-10-18: 300 mg via ORAL
  Filled 2014-10-17 (×2): qty 1

## 2014-10-17 MED ORDER — PHENYLEPHRINE 40 MCG/ML (10ML) SYRINGE FOR IV PUSH (FOR BLOOD PRESSURE SUPPORT)
PREFILLED_SYRINGE | INTRAVENOUS | Status: AC
  Filled 2014-10-17: qty 10

## 2014-10-17 MED ORDER — PROMETHAZINE HCL 25 MG/ML IJ SOLN
INTRAMUSCULAR | Status: DC
Start: 2014-10-17 — End: 2014-10-17
  Filled 2014-10-17: qty 1

## 2014-10-17 MED ORDER — ACETAMINOPHEN 650 MG RE SUPP
650.0000 mg | RECTAL | Status: DC | PRN
  Filled 2014-10-17: qty 1

## 2014-10-17 MED ORDER — SPIRONOLACTONE 25 MG PO TABS
25.0000 mg | ORAL_TABLET | Freq: Every day | ORAL | Status: DC
  Administered 2014-10-18: 25 mg via ORAL
  Filled 2014-10-17: qty 1

## 2014-10-17 MED ORDER — MEPERIDINE HCL 25 MG/ML IJ SOLN: 6.2500 mg | INTRAMUSCULAR | Status: DC | PRN

## 2014-10-17 MED ORDER — ONDANSETRON HCL 4 MG/2ML IJ SOLN
INTRAMUSCULAR | Status: AC
  Filled 2014-10-17: qty 2

## 2014-10-17 MED ORDER — 0.9 % SODIUM CHLORIDE (POUR BTL) OPTIME
TOPICAL | Status: DC | PRN
  Administered 2014-10-17: 1000 mL

## 2014-10-17 MED ORDER — HYDROMORPHONE HCL 1 MG/ML IJ SOLN
0.2500 mg | INTRAMUSCULAR | Status: DC | PRN
  Administered 2014-10-17: 0.5 mg via INTRAVENOUS
  Administered 2014-10-17: 0.25 mg via INTRAVENOUS
  Administered 2014-10-17 (×2): 0.5 mg via INTRAVENOUS

## 2014-10-17 MED ORDER — METHYLENE BLUE 1 % INJ SOLN
INTRAMUSCULAR | Status: DC | PRN
  Administered 2014-10-17: 6 mL via SUBMUCOSAL

## 2014-10-17 MED ORDER — NITROGLYCERIN 0.4 MG SL SUBL: 0.4000 mg | SUBLINGUAL_TABLET | SUBLINGUAL | Status: DC | PRN

## 2014-10-17 MED ORDER — TECHNETIUM TC 99M SULFUR COLLOID FILTERED: 1.0000 | Freq: Once | INTRAVENOUS | Status: AC | PRN

## 2014-10-17 SURGICAL SUPPLY — 52 items
BENZOIN TINCTURE PRP APPL 2/3 (GAUZE/BANDAGES/DRESSINGS) ×2 IMPLANT
BINDER BREAST LRG (GAUZE/BANDAGES/DRESSINGS) ×2 IMPLANT
BINDER BREAST XLRG (GAUZE/BANDAGES/DRESSINGS) IMPLANT
BLADE SURG 10 STRL SS (BLADE) ×2 IMPLANT
BLADE SURG 15 STRL LF DISP TIS (BLADE) ×1 IMPLANT
BLADE SURG 15 STRL SS (BLADE) ×1
CANISTER SUCTION 2500CC (MISCELLANEOUS) ×2 IMPLANT
CHLORAPREP W/TINT 26ML (MISCELLANEOUS) ×2 IMPLANT
CLIP TI MEDIUM 6 (CLIP) ×2 IMPLANT
CLOSURE STERI-STRIP 1/4X4 (GAUZE/BANDAGES/DRESSINGS) ×2 IMPLANT
CONT SPEC 4OZ CLIKSEAL STRL BL (MISCELLANEOUS) ×2 IMPLANT
COVER PROBE W GEL 5X96 (DRAPES) ×2 IMPLANT
COVER SURGICAL LIGHT HANDLE (MISCELLANEOUS) ×2 IMPLANT
DECANTER SPIKE VIAL GLASS SM (MISCELLANEOUS) IMPLANT
DEVICE DUBIN SPECIMEN MAMMOGRA (MISCELLANEOUS) ×2 IMPLANT
DRAPE PED LAPAROTOMY (DRAPES) ×2 IMPLANT
DRAPE UTILITY XL STRL (DRAPES) ×4 IMPLANT
DRSG OPSITE 4X5.5 SM (GAUZE/BANDAGES/DRESSINGS) ×2 IMPLANT
ELECT CAUTERY BLADE 6.4 (BLADE) ×2 IMPLANT
ELECT REM PT RETURN 9FT ADLT (ELECTROSURGICAL) ×2
ELECTRODE REM PT RTRN 9FT ADLT (ELECTROSURGICAL) ×1 IMPLANT
GAUZE SPONGE 4X4 12PLY STRL (GAUZE/BANDAGES/DRESSINGS) ×2 IMPLANT
GLOVE BIOGEL PI IND STRL 8 (GLOVE) ×1 IMPLANT
GLOVE BIOGEL PI INDICATOR 8 (GLOVE) ×1
GLOVE ECLIPSE 8.0 STRL XLNG CF (GLOVE) ×2 IMPLANT
GOWN STRL REUS W/ TWL LRG LVL3 (GOWN DISPOSABLE) ×2 IMPLANT
GOWN STRL REUS W/TWL LRG LVL3 (GOWN DISPOSABLE) ×2
KIT BASIN OR (CUSTOM PROCEDURE TRAY) ×2 IMPLANT
KIT ROOM TURNOVER OR (KITS) ×2 IMPLANT
NEEDLE 18GX1X1/2 (RX/OR ONLY) (NEEDLE) IMPLANT
NEEDLE 22X1 1/2 (OR ONLY) (NEEDLE) ×2 IMPLANT
NEEDLE HYPO 25GX1X1/2 BEV (NEEDLE) ×2 IMPLANT
NS IRRIG 1000ML POUR BTL (IV SOLUTION) ×2 IMPLANT
PACK SURGICAL SETUP 50X90 (CUSTOM PROCEDURE TRAY) ×2 IMPLANT
PAD ARMBOARD 7.5X6 YLW CONV (MISCELLANEOUS) ×2 IMPLANT
PENCIL BUTTON HOLSTER BLD 10FT (ELECTRODE) ×2 IMPLANT
RUBBERBAND STERILE (MISCELLANEOUS) ×2 IMPLANT
SPONGE GAUZE 4X4 12PLY STER LF (GAUZE/BANDAGES/DRESSINGS) ×2 IMPLANT
SPONGE LAP 4X18 X RAY DECT (DISPOSABLE) ×2 IMPLANT
STRIP CLOSURE SKIN 1/2X4 (GAUZE/BANDAGES/DRESSINGS) ×2 IMPLANT
SUT MNCRL AB 4-0 PS2 18 (SUTURE) ×4 IMPLANT
SUT SILK 3 0 SH 30 (SUTURE) ×2 IMPLANT
SUT VIC AB 3-0 SH 18 (SUTURE) IMPLANT
SUT VIC AB 3-0 SH 8-18 (SUTURE) ×4 IMPLANT
SYR CONTROL 10ML LL (SYRINGE) ×2 IMPLANT
SYRINGE 10CC LL (SYRINGE) ×2 IMPLANT
TAPE CLOTH SURG 4X10 WHT LF (GAUZE/BANDAGES/DRESSINGS) ×2 IMPLANT
TOWEL OR 17X24 6PK STRL BLUE (TOWEL DISPOSABLE) ×2 IMPLANT
TOWEL OR 17X26 10 PK STRL BLUE (TOWEL DISPOSABLE) ×2 IMPLANT
TUBE CONNECTING 12X1/4 (SUCTIONS) ×2 IMPLANT
WATER STERILE IRR 1000ML POUR (IV SOLUTION) IMPLANT
YANKAUER SUCT BULB TIP NO VENT (SUCTIONS) ×2 IMPLANT

## 2014-10-17 NOTE — Op Note (Signed)
Operative Note  Carolyn Rowland female 74 y.o. 10/17/2014  PREOPERATIVE DX:  Invasive ductal carcinoma of right breast  POSTOPERATIVE DX:  Same  PROCEDURE:   Blue dye injection into right breast. Right axillary lymphatic mapping. Right partial mastectomy after wire localization. Right axillary sentinel lymph node biopsy.         Surgeon: [name]   Assistants: none  Anesthesia: General LMA anesthesia  Indications:   This is a 74 year old female found to have invasive ductal carcinoma of the right breast. This was at the 11:00 position and noted on mammogram. She now presents for the above procedure.    Procedure Detail:  She underwent successful wire localization. Injection of radioactive fluid in the right periareolar area was performed. The right breast was marked with my initials. She was brought to the operating room placed supine on the operating table and general anesthetic was given. Using a neoprobe, I got very low counts in the right axilla. Because of this, I sterilely prepped the periareolar area. Blue dye injection was then performed using methylene blue in 4 quadrants. Breast massage was performed for 5 minutes.  The wire was then cut close to the skin. The breast upper right arm and axilla and wire were sterilely prepped and draped.  A transverse incision was made in the lower right axilla and the subcutaneous tissue divided using electrocautery until the axillary content area was entered. Using the neoprobe I was able to pick up an area of increased counts at this time. I then identified a hot blue lymph node and remove this with electrocautery. This was labeled as the sentinel lymph node. There were no other blue lymph nodes and no other increased areas of counts in the right axilla. Bleeding was controlled with electrocautery. Once hemostasis was adequate, the subcutaneous tissue was closed with interrupted 3-0 Vicryl sutures. The skin was then closed with a running  4-0 Monocryl subcuticular stitch.  Next the wire was approached which was in the upper outer quadrant of the breast. A curvilinear incision was made through the skin and subcutaneous tissue. The wire was then delivered into the wound. Using electrocautery then performed a partial mastectomy around the tip of the wire. I marked the anterior margin with a single suture and the medial margin with a double suture. I attempted to get grossly negative margins. Specimen mammogram was performed which demonstrated the lesion and the clip to be present and the margins appeared to be adequate. This was confirmed by the radiologist.  The partial mastectomy wound was inspected and bleeding was controlled electrocautery. The subcutaneous tissue was then closed with interrupted 3-0 Vicryl sutures. The skin was closed with a running 4-0 Monocryl subcuticular stitch. Steri-Strips and a sterile dressing were placed on both wounds. A breast binder was applied.  She tolerated the procedures well without any apparent complications and was taken to the recovery room in satisfactory condition.   Estimated Blood Loss:  200 mL         Drains: none         Specimens: right axillary sentinel lymph node. Right breast tissue.        Complications:  * No complications entered in OR log *         Disposition: PACU - hemodynamically stable.         Condition: stable

## 2014-10-17 NOTE — Interval H&P Note (Signed)
History and Physical Interval Note:  3/61/4431 5:40 PM  Carolyn Rowland  has presented today for surgery, with the diagnosis of right breast cancer  The various methods of treatment have been discussed with the patient and family. After consideration of risks, benefits and other options for treatment, the patient has consented to  Procedure(s): BREAST LUMPECTOMY WITH NEEDLE LOCALIZATION AND AXILLARY SENTINEL LYMPH NODE BX (Right) as a surgical intervention .  The patient's history has been reviewed, patient examined, no change in status, stable for surgery.  I have reviewed the patient's chart and labs.  Questions were answered to the patient's satisfaction.     Shanan Mcmiller Lenna Sciara

## 2014-10-17 NOTE — Transfer of Care (Signed)
Immediate Anesthesia Transfer of Care Note  Patient: Carolyn Rowland  Procedure(s) Performed: Procedure(s): BREAST LUMPECTOMY WITH NEEDLE LOCALIZATION AND AXILLARY SENTINEL LYMPH NODE BX (Right)  Patient Location: PACU  Anesthesia Type:General  Level of Consciousness: sedated  Airway & Oxygen Therapy: Patient Spontanous Breathing and Patient connected to nasal cannula oxygen  Post-op Assessment: Report given to RN and Post -op Vital signs reviewed and stable  Post vital signs: stable  Last Vitals:  Filed Vitals:   10/17/14 1319  BP: 150/69  Pulse: 71  Temp: 36.4 C  Resp: 18    Complications: No apparent anesthesia complications

## 2014-10-17 NOTE — Progress Notes (Signed)
Called by RN and informed that Carolyn Rowland was nauseated and appeared worn out.  She has been talking some and walking.  RN is concerned about her going home.  Will admit for overnight observation.

## 2014-10-17 NOTE — Anesthesia Procedure Notes (Addendum)
Anesthesia Regional Block:  Pectoralis block  Pre-Anesthetic Checklist: ,, timeout performed, Correct Patient, Correct Site, Correct Laterality, Correct Procedure, Correct Position, site marked, Risks and benefits discussed, Surgical consent,  Pre-op evaluation,  Post-op pain management   Prep: chloraprep       Needles:   Needle Type: Stimiplex     Needle Length: 9cm 9 cm     Additional Needles:  Procedures: ultrasound guided (picture in chart) Pectoralis block Narrative:  Injection made incrementally with aspirations every 5 mL.  Performed by: Personally  Anesthesiologist: Nolon Nations  Additional Notes: Patient tolerated well. Good fascial spread noted.

## 2014-10-17 NOTE — Progress Notes (Signed)
Patient came by herself.  2 Belonging bags taken to PACU.  Glasses placed in right tennis shoe.  DA

## 2014-10-17 NOTE — Discharge Instructions (Addendum)
[place] Office Phone Number [phone]  BREAST BIOPSY/ PARTIAL MASTECTOMY: POST OP INSTRUCTIONS  Always review your discharge instruction sheet given to you by the facility where your surgery was performed.  IF YOU HAVE DISABILITY OR FAMILY LEAVE FORMS, YOU MUST BRING THEM TO THE OFFICE FOR PROCESSING.  DO NOT GIVE THEM TO YOUR DOCTOR.  1. A prescription for pain medication may be given to you upon discharge.  Take your pain medication as prescribed, if needed.  If narcotic pain medicine is not needed, then you may take acetaminophen (Tylenol) or ibuprofen (Advil) as needed. 2. Take your usually prescribed medications unless otherwise directed 3. If you need a refill on your pain medication, please contact your pharmacy.  They will contact our office to request authorization.  Prescriptions will not be filled after 5pm or on week-ends. 4. You should eat very light the first 24 hours after surgery, such as soup, crackers, pudding, etc.  Resume your normal diet the day after surgery. 5. Most patients will experience some swelling and bruising in the breast.  Ice packs and a good support bra will help.  Swelling and bruising can take several days to resolve.  6. It is common to experience some constipation if taking pain medication after surgery.  Increasing fluid intake and taking a stool softener will usually help or prevent this problem from occurring.  A mild laxative (Milk of Magnesia or Miralax) should be taken according to package directions if there are no bowel movements after 48 hours. 7. Unless discharge instructions indicate otherwise, you may remove your bandages 48 hours after surgery, and you may shower at that time.  You may have steri-strips (small skin tapes) in place directly over the incision.  These strips should be left on the skin until they fall off.  If your surgeon used skin glue on the incision, you may shower in 24 hours.  The glue will flake off over the  next 2-3 weeks.  Any sutures or staples will be removed at the office during your follow-up visit. 8. ACTIVITIES:  Light activities with left arm for 1-2 weeks until you are pain-free. a. You may drive when you no longer are taking prescription pain medication, you can comfortably wear a seatbelt, and you can safely maneuver your car and apply brakes. b. RETURN TO WORK:  ______________________________________________________________________________________ 9. You should see your doctor in the office for a follow-up appointment approximately two weeks after your surgery.  Please call to make this appointment. 10. OTHER INSTRUCTIONS: _May restart Brilinta in 3 days.______________________________________________________________________________________________ _____________________________________________________________________________________________________________________________________ _____________________________________________________________________________________________________________________________________ _____________________________________________________________________________________________________________________________________  WHEN TO CALL YOUR DOCTOR: 1. Fever over 101.0 2. Nausea and/or vomiting. 3. Extreme swelling or bruising. 4. Continued bleeding from incision. 5. Increased pain, redness, or drainage from the incision.  The clinic staff is available to answer your questions during regular business hours.  Please dont hesitate to call and ask to speak to one of the nurses for clinical concerns.  If you have a medical emergency, go to the nearest emergency room or call 911.  A surgeon from [facility] is always on call at the hospital.  For further questions, please visit [email or web address]

## 2014-10-17 NOTE — Anesthesia Preprocedure Evaluation (Addendum)
Anesthesia Evaluation  Patient identified by MRN, date of birth, ID band Patient awake    Reviewed: Allergy & Precautions, NPO status , Patient's Chart, lab work & pertinent test results  Airway Mallampati: II  TM Distance: >3 FB Neck ROM: Full    Dental no notable dental hx.    Pulmonary pneumonia -, former smoker,  breath sounds clear to auscultation  Pulmonary exam normal       Cardiovascular hypertension, + CAD + Valvular Problems/Murmurs Rhythm:Regular Rate:Normal     Neuro/Psych negative neurological ROS  negative psych ROS   GI/Hepatic Neg liver ROS, hiatal hernia, GERD-  ,  Endo/Other  negative endocrine ROS  Renal/GU negative Renal ROS     Musculoskeletal negative musculoskeletal ROS (+) Arthritis -,   Abdominal (+) + obese,   Peds  Hematology negative hematology ROS (+)   Anesthesia Other Findings   Reproductive/Obstetrics negative OB ROS                            Anesthesia Physical Anesthesia Plan  ASA: II  Anesthesia Plan: General   Post-op Pain Management:    Induction: Intravenous  Airway Management Planned: LMA  Additional Equipment:   Intra-op Plan:   Post-operative Plan: Extubation in OR  Informed Consent: I have reviewed the patients History and Physical, chart, labs and discussed the procedure including the risks, benefits and alternatives for the proposed anesthesia with the patient or authorized representative who has indicated his/her understanding and acceptance.   Dental advisory given  Plan Discussed with: CRNA  Anesthesia Plan Comments:        Anesthesia Quick Evaluation

## 2014-10-17 NOTE — Anesthesia Postprocedure Evaluation (Signed)
  Anesthesia Post-op Note  Patient: Carolyn Rowland  Procedure(s) Performed: Procedure(s): BREAST LUMPECTOMY WITH NEEDLE LOCALIZATION AND AXILLARY SENTINEL LYMPH NODE BX (Right)  Patient Location: PACU  Anesthesia Type: General   Level of Consciousness: awake, alert  and oriented  Airway and Oxygen Therapy: Patient Spontanous Breathing  Post-op Pain: mild  Post-op Assessment: Post-op Vital signs reviewed  Post-op Vital Signs: Reviewed  Last Vitals:  Filed Vitals:   10/17/14 1945  BP: 138/84  Pulse: 71  Temp:   Resp: 10    Complications: No apparent anesthesia complications

## 2014-10-17 NOTE — H&P (Signed)
Carolyn Rowland is an 74 y.o. female.   Chief Complaint:   Her for elective surgery HPI:  She was noted to have suspicious microcalcifications in the right breast, 11:00 area, on her annual mammogram. These measured 5 mm. Image guided biopsy was performed which demonstrated invasive ductal carcinoma with papillary features. ER/PR positive,  proliferation rate 7%. MRI demonstrated an area 1.8 cm in size but no other suspicious lesions. She was presented at the multidisciplinary breast cancer conference. All involved felt she was a good candidate for breast conservation therapy.   Past Medical History  Diagnosis Date  . Heart murmur   . Hypertension   . High cholesterol   . Pneumonia 1970's    "once"  . H/O hiatal hernia   . GERD (gastroesophageal reflux disease)   . Arthritis     "hands; legs" (08/17/2013)  . CAP (community acquired pneumonia)   . Aortic stenosis     mild to moderate AS 07/2013    Past Surgical History  Procedure Laterality Date  . Cardiac catheterization  08/07/2013  . Coronary angioplasty with stent placement  08/17/2013    "6 stents"(08/17/2013)  . Appendectomy  1960  . Abdominal hysterectomy  1985  . Tubal ligation  1980  . Left and right heart catheterization with coronary angiogram N/A 08/07/2013    Procedure: LEFT AND RIGHT HEART CATHETERIZATION WITH CORONARY ANGIOGRAM;  Surgeon: Laverda Page, MD;  Location: [facility] CATH LAB;  Service: Cardiovascular;  Laterality: N/A;  . Percutaneous coronary stent intervention (pci-s) N/A 08/17/2013    Procedure: PERCUTANEOUS CORONARY STENT INTERVENTION (PCI-S);  Surgeon: Laverda Page, MD;  Location: [facility] CATH LAB;  Service: Cardiovascular;  Laterality: N/A;    Family History  Problem Relation Age of Onset  . Cancer Brother   . Cancer Brother   . Heart attack Brother    Social History:  reports that she quit smoking about 6 months ago. Her smoking use included Cigarettes. She has a 52 pack-year smoking history. She has  never used smokeless tobacco. She reports that she does not drink alcohol or use illicit drugs.  Allergies:  Allergies  Allergen Reactions  . Penicillins Rash    Medications Prior to Admission  Medication Sig Dispense Refill  . acetaminophen (TYLENOL) 650 MG CR tablet Take 1,300 mg by mouth every 8 (eight) hours as needed for pain.    Marland Kitchen aspirin 81 MG chewable tablet Chew 1 tablet (81 mg total) by mouth daily.    Marland Kitchen atorvastatin (LIPITOR) 80 MG tablet Take 1 tablet (80 mg total) by mouth daily. 30 tablet 1  . gabapentin (NEURONTIN) 300 MG capsule Take 1 capsule (300 mg total) by mouth at bedtime. 30 capsule 0  . isosorbide mononitrate (IMDUR) 60 MG 24 hr tablet Take 1 tablet (60 mg total) by mouth daily. 30 tablet 1  . metoprolol succinate (TOPROL-XL) 50 MG 24 hr tablet Take 50 mg by mouth daily. Take with or immediately following a meal.    . Multiple Vitamins-Minerals (MULTIVITAMIN WITH MINERALS) tablet Take 1 tablet by mouth daily.    Marland Kitchen omeprazole (PRILOSEC) 40 MG capsule Take 40 mg by mouth daily.    Marland Kitchen spironolactone (ALDACTONE) 25 MG tablet Take 25 mg by mouth daily.    . Ticagrelor (BRILINTA) 90 MG TABS tablet Take 1 tablet (90 mg total) by mouth 2 (two) times daily. 60 tablet 0  . traMADol (ULTRAM) 50 MG tablet Take 2 tablets (100 mg total) by mouth every 8 (eight) hours  as needed. (Patient taking differently: Take 100 mg by mouth every 8 (eight) hours as needed for moderate pain. ) 30 tablet 0  . azithromycin (ZITHROMAX) 250 MG tablet Take 1 tablet orally daily (Patient not taking: Reported on 10/14/2014) 6 each 0  . cefdinir (OMNICEF) 300 MG capsule Take 1 capsule (300 mg total) by mouth 2 (two) times daily. (Patient not taking: Reported on 10/14/2014) 12 capsule 0  . guaiFENesin-dextromethorphan (ROBITUSSIN DM) 100-10 MG/5ML syrup Take 5 mLs by mouth every 4 (four) hours as needed for cough. (Patient not taking: Reported on 10/14/2014) 118 mL 0  . HYDROcodone-acetaminophen (NORCO) 5-325  MG per tablet Take 1 tablet by mouth every 6 (six) hours as needed for moderate pain. (Patient not taking: Reported on 10/14/2014) 20 tablet 0  . nitroGLYCERIN (NITROSTAT) 0.4 MG SL tablet Place 1 tablet (0.4 mg total) under the tongue every 5 (five) minutes as needed for chest pain. 30 tablet 1  . predniSONE (DELTASONE) 50 MG tablet Take 1 pill daily for 5 days. (Patient not taking: Reported on 10/14/2014) 5 tablet 0    Results for orders placed or performed during the hospital encounter of 10/16/14 (from the past 48 hour(s))  CBC WITH DIFFERENTIAL     Status: Abnormal   Collection Time: 10/16/14  1:15 PM  Result Value Ref Range   WBC 5.0 4.0 - 10.5 K/uL   RBC 5.00 3.87 - 5.11 MIL/uL   Hemoglobin 13.4 12.0 - 15.0 g/dL   HCT 39.8 36.0 - 46.0 %   MCV 79.6 78.0 - 100.0 fL   MCH 26.8 26.0 - 34.0 pg   MCHC 33.7 30.0 - 36.0 g/dL   RDW 17.1 (H) 11.5 - 15.5 %   Platelets 195 150 - 400 K/uL   Neutrophils Relative % 52 43 - 77 %   Neutro Abs 2.6 1.7 - 7.7 K/uL   Lymphocytes Relative 40 12 - 46 %   Lymphs Abs 2.0 0.7 - 4.0 K/uL   Monocytes Relative 6 3 - 12 %   Monocytes Absolute 0.3 0.1 - 1.0 K/uL   Eosinophils Relative 1 0 - 5 %   Eosinophils Absolute 0.1 0.0 - 0.7 K/uL   Basophils Relative 1 0 - 1 %   Basophils Absolute 0.0 0.0 - 0.1 K/uL  Comprehensive metabolic panel     Status: Abnormal   Collection Time: 10/16/14  1:15 PM  Result Value Ref Range   Sodium 140 135 - 145 mmol/L   Potassium 4.4 3.5 - 5.1 mmol/L   Chloride 106 96 - 112 mmol/L   CO2 27 19 - 32 mmol/L   Glucose, Bld 109 (H) 70 - 99 mg/dL   BUN 15 6 - 23 mg/dL   Creatinine, Ser 0.86 0.50 - 1.10 mg/dL   Calcium 9.2 8.4 - 10.5 mg/dL   Total Protein 7.0 6.0 - 8.3 g/dL   Albumin 3.5 3.5 - 5.2 g/dL   AST 20 0 - 37 U/L   ALT 14 0 - 35 U/L   Alkaline Phosphatase 54 39 - 117 U/L   Total Bilirubin 0.7 0.3 - 1.2 mg/dL   GFR calc non Af Amer 66 (L) >90 mL/min   GFR calc Af Amer 77 (L) >90 mL/min    Comment: (NOTE) The eGFR  has been calculated using the CKD EPI equation. This calculation has not been validated in all clinical situations. eGFR's persistently <90 mL/min signify possible Chronic Kidney Disease.    Anion gap 7 5 - 15  Protime-INR     Status: None   Collection Time: 10/16/14  1:15 PM  Result Value Ref Range   Prothrombin Time 13.8 11.6 - 15.2 seconds   INR 1.05 0.00 - 1.49   Nm Sentinel Node Inj-no Rpt (breast)  10/17/2014   CLINICAL DATA: Right breast cancer   Sulfur colloid was injected intradermally by the nuclear medicine  technologist for breast cancer sentinel node localization.     Review of Systems  Constitutional: Negative for fever and chills.  Gastrointestinal: Negative for nausea and vomiting.    Blood pressure 150/69, pulse 71, temperature 97.6 F (36.4 C), temperature source Oral, resp. rate 18, weight 97.523 kg (215 lb), SpO2 100 %. Physical Exam  Constitutional: She appears well-developed and well-nourished. No distress.  HENT:  Head: Normocephalic and atraumatic.  Cardiovascular: Normal rate and regular rhythm.   Respiratory: Breath sounds normal.  Right breast bandage on.  GI: Soft.  Musculoskeletal: She exhibits no edema.  Neurological: She is alert.  Skin: Skin is warm and dry.     Assessment/Plan Invasive ductal carcinoma of right breast.  Plan:  Right partial mastectomy after needle loc and right axillary sentinel lymph node biopsy.  Mischelle Reeg J 10/17/2014, 3:22 PM

## 2014-10-18 DIAGNOSIS — K219 Gastro-esophageal reflux disease without esophagitis: Secondary | ICD-10-CM | POA: Diagnosis not present

## 2014-10-18 DIAGNOSIS — Z170 Estrogen receptor positive status [ER+]: Secondary | ICD-10-CM | POA: Diagnosis not present

## 2014-10-18 DIAGNOSIS — Z87891 Personal history of nicotine dependence: Secondary | ICD-10-CM | POA: Diagnosis not present

## 2014-10-18 DIAGNOSIS — Z880 Allergy status to penicillin: Secondary | ICD-10-CM | POA: Diagnosis not present

## 2014-10-18 DIAGNOSIS — Z79899 Other long term (current) drug therapy: Secondary | ICD-10-CM | POA: Diagnosis not present

## 2014-10-18 DIAGNOSIS — C50911 Malignant neoplasm of unspecified site of right female breast: Secondary | ICD-10-CM | POA: Diagnosis not present

## 2014-10-18 DIAGNOSIS — I2510 Atherosclerotic heart disease of native coronary artery without angina pectoris: Secondary | ICD-10-CM | POA: Diagnosis not present

## 2014-10-18 DIAGNOSIS — I10 Essential (primary) hypertension: Secondary | ICD-10-CM | POA: Diagnosis not present

## 2014-10-18 NOTE — Progress Notes (Signed)
Lum Keas Zent to be D/C'd Home per MD order.  Discussed with the patient and all questions fully answered.  VSS, Surgical site clean, dry, intact with dressing in place.  IV catheter discontinued intact. Site without signs and symptoms of complications. Dressing and pressure applied.  An After Visit Summary was printed and given to the patient. Prescription was given to patient's husband last night.  D/c education completed with patient/family including follow up instructions, medication list, d/c activities limitations if indicated, with other d/c instructions as indicated by MD - patient able to verbalize understanding, all questions fully answered.   Patient instructed to return to ED, call 911, or call MD for any changes in condition.   Patient escorted via [place], and D/C home via private auto.  Micki Riley 10/18/2014 12:19 PM

## 2014-10-18 NOTE — Progress Notes (Signed)
1 Day Post-Op  Subjective: Feels better today.  Minimal pain  Objective: Vital signs in last 24 hours: Temp:  [97 F (36.1 C)-98.5 F (36.9 C)] 97.3 F (36.3 C) (04/15 0702) Pulse Rate:  [49-72] 64 (04/15 0702) Resp:  [10-21] 18 (04/15 0702) BP: (84-163)/(48-96) 111/68 mmHg (04/15 0702) SpO2:  [91 %-100 %] 97 % (04/15 0702) Weight:  [97.523 kg (215 lb)-104.831 kg (231 lb 1.8 oz)] 104.831 kg (231 lb 1.8 oz) (04/14 2136) Last BM Date: 10/16/14  Intake/Output from previous day: 04/14 0701 - 04/15 0700 In: 2760 [P.O.:60; I.V.:2700] Out: 700 [Urine:700] Intake/Output this shift:    PE: General- In NAD Right breast/axilla-dressings dry, minimal swelling  Lab Results:   Recent Labs  10/16/14 1315  WBC 5.0  HGB 13.4  HCT 39.8  PLT 195   BMET  Recent Labs  10/16/14 1315  NA 140  K 4.4  CL 106  CO2 27  GLUCOSE 109*  BUN 15  CREATININE 0.86  CALCIUM 9.2   PT/INR  Recent Labs  10/16/14 1315  LABPROT 13.8  INR 1.05   Comprehensive Metabolic Panel:    Component Value Date/Time   NA 140 10/16/2014 1315   NA 140 09/05/2014 0345   K 4.4 10/16/2014 1315   K 3.4* 09/05/2014 0345   CL 106 10/16/2014 1315   CL 108 09/05/2014 0345   CO2 27 10/16/2014 1315   CO2 24 09/05/2014 0345   BUN 15 10/16/2014 1315   BUN 6 09/05/2014 0345   CREATININE 0.86 10/16/2014 1315   CREATININE 0.84 09/05/2014 0345   GLUCOSE 109* 10/16/2014 1315   GLUCOSE 147* 09/05/2014 0345   CALCIUM 9.2 10/16/2014 1315   CALCIUM 7.8* 09/05/2014 0345   AST 20 10/16/2014 1315   AST 42* 09/04/2014 2127   ALT 14 10/16/2014 1315   ALT 21 09/04/2014 2127   ALKPHOS 54 10/16/2014 1315   ALKPHOS 81 09/04/2014 2127   BILITOT 0.7 10/16/2014 1315   BILITOT 1.4* 09/04/2014 2127   PROT 7.0 10/16/2014 1315   PROT 6.9 09/04/2014 2127   ALBUMIN 3.5 10/16/2014 1315   ALBUMIN 2.7* 09/04/2014 2127     Studies/Results: Nm Sentinel Node Inj-no Rpt (breast)  10/17/2014   CLINICAL DATA: Right breast  cancer   Sulfur colloid was injected intradermally by the nuclear medicine  technologist for breast cancer sentinel node localization.     Anti-infectives: Anti-infectives    Start     Dose/Rate Route Frequency Ordered Stop   10/17/14 0600  vancomycin (VANCOCIN) IVPB 1000 mg/200 mL premix     1,000 mg 200 mL/hr over 60 Minutes Intravenous On call to O.R. 10/16/14 1345 10/17/14 1650      Assessment Principal Problem:   Breast cancer of upper-outer quadrant of right female breast s/p right partial mastectomy and right axillary sentinel lymph node biopsy 10/17/14-stable overnight and feels better today.      Plan: Discharge.  Instructions given.   Edinson Domeier J 10/18/2014

## 2014-10-18 NOTE — Discharge Summary (Signed)
Physician Discharge Summary  Patient ID: Carolyn Rowland MRN: [id number] DOB/AGE: Nov 24, 1940 74 y.o.  Admit date: 10/17/2014 Discharge date: 10/18/2014  Admission Diagnoses:  Invasive right breast cancer  Discharge Diagnoses:  Principal Problem:   Breast cancer of upper-outer quadrant of right female breast s/p right partial mastectomy and right axillary sentinel lymph node biopsy 10/17/14   CAD  GERD      Discharged Condition: good  Hospital Course: She underwent the above procedure which ended 4/14 evening and was nauseated and very tired.  She was kept for overnight observation and felt much better on POD #1.  She was discharged, instructions were given.   Discharge Exam: Blood pressure 111/68, pulse 64, temperature 97.3 F (36.3 C), temperature source Axillary, resp. rate 18, height 5\' 6"  (1.676 m), weight 104.831 kg (231 lb 1.8 oz), SpO2 97 %.   Disposition: 01-Home or Self Care     Medication List    STOP taking these medications        azithromycin 250 MG tablet  Commonly known as:  ZITHROMAX     cefdinir 300 MG capsule  Commonly known as:  OMNICEF     guaiFENesin-dextromethorphan 100-10 MG/5ML syrup  Commonly known as:  ROBITUSSIN DM     predniSONE 50 MG tablet  Commonly known as:  DELTASONE     ticagrelor 90 MG Tabs tablet  Commonly known as:  BRILINTA      TAKE these medications        acetaminophen 650 MG CR tablet  Commonly known as:  TYLENOL  Take 1,300 mg by mouth every 8 (eight) hours as needed for pain.     aspirin 81 MG chewable tablet  Chew 1 tablet (81 mg total) by mouth daily.     atorvastatin 80 MG tablet  Commonly known as:  LIPITOR  Take 1 tablet (80 mg total) by mouth daily.     gabapentin 300 MG capsule  Commonly known as:  NEURONTIN  Take 1 capsule (300 mg total) by mouth at bedtime.     HYDROcodone-acetaminophen 5-325 MG per tablet  Commonly known as:  NORCO/VICODIN  Take 1-2 tablets by mouth every 4 (four) hours as needed  for moderate pain or severe pain.     isosorbide mononitrate 60 MG 24 hr tablet  Commonly known as:  IMDUR  Take 1 tablet (60 mg total) by mouth daily.     metoprolol succinate 50 MG 24 hr tablet  Commonly known as:  TOPROL-XL  Take 50 mg by mouth daily. Take with or immediately following a meal.     multivitamin with minerals tablet  Take 1 tablet by mouth daily.     nitroGLYCERIN 0.4 MG SL tablet  Commonly known as:  NITROSTAT  Place 1 tablet (0.4 mg total) under the tongue every 5 (five) minutes as needed for chest pain.     omeprazole 40 MG capsule  Commonly known as:  PRILOSEC  Take 40 mg by mouth daily.     spironolactone 25 MG tablet  Commonly known as:  ALDACTONE  Take 25 mg by mouth daily.     traMADol 50 MG tablet  Commonly known as:  ULTRAM  Take 2 tablets (100 mg total) by mouth every 8 (eight) hours as needed.         Signed: [name] 10/18/2014, 11:43 AM

## 2014-10-21 ENCOUNTER — Encounter (HOSPITAL_COMMUNITY): Payer: Self-pay | Admitting: General Surgery

## 2014-10-24 NOTE — Assessment & Plan Note (Addendum)
Right breast lumpectomy 10/17/2014: 0.28 cm focus of residual IDC grade 2 with papillary features, 0/1 sentinel node, background of sclerosing lymphocytic lobulitis, fibrocystic changes, E 99%, PR 97%, HER-2 negative, Ki-67 7%, T1a N0 M0 stage IA  Pathology review: I discussed the final pathology report in great detail and provided her with a copy of this result. The final tumor size was only 0.28 cm. She has very favorable prognostic type.  Recommendation: 1. Radiation therapy followed by 2. Adjuvant antiestrogen therapy  Aromatase inhibitor counseling:We discussed the risks and benefits of anti-estrogen therapy with aromatase inhibitors. These include but not limited to insomnia, hot flashes, mood changes, vaginal dryness, bone density loss, and weight gain. Although rare, serious side effects including endometrial cancer, risk of blood clots were also discussed. We strongly believe that the benefits far outweigh the risks. Patient understands these risks and consented to starting treatment. Planned treatment duration is 5 years. I provided her with a prescription for anastrozole. She will started 2 weeks after radiation is complete. Return to clinic in 3 months.

## 2014-10-25 ENCOUNTER — Ambulatory Visit (HOSPITAL_BASED_OUTPATIENT_CLINIC_OR_DEPARTMENT_OTHER): Payer: Commercial Managed Care - HMO | Admitting: Hematology and Oncology

## 2014-10-25 ENCOUNTER — Telehealth: Payer: Self-pay | Admitting: Hematology and Oncology

## 2014-10-25 VITALS — BP 110/70 | HR 85 | Temp 97.7°F | Resp 18 | Ht 66.0 in | Wt 216.8 lb

## 2014-10-25 DIAGNOSIS — C50411 Malignant neoplasm of upper-outer quadrant of right female breast: Secondary | ICD-10-CM | POA: Diagnosis not present

## 2014-10-25 MED ORDER — ANASTROZOLE 1 MG PO TABS: 1.0000 mg | ORAL_TABLET | Freq: Every day | ORAL | Status: DC

## 2014-10-25 NOTE — Telephone Encounter (Signed)
Spoke with patient and she is aware of her appointment in august

## 2014-10-25 NOTE — Progress Notes (Signed)
Patient Care Team: Fanny Bien, MD as PCP - General (Family Medicine)  DIAGNOSIS: No matching staging information was found for the patient.  SUMMARY OF ONCOLOGIC HISTORY:   Breast cancer of upper-outer quadrant of right female breast   08/07/2014 Initial Biopsy Right breast biopsy: Invasive ductal carcinoma with papillary features associated microcalcifications, ER 99%, PR 97%, Ki-67 7%, HER-2 negative by fish   08/20/2014 Breast MRI Right breast: 1.8 cm enhancement at 11:00, left breast 7 x 9 mm oval mass at 9:00 stable from 2010-benign   10/17/2014 Surgery Right breast lumpectomy: 0.28 cm focus of residual IDC grade 2 with papillary features, 0/1 sentinel node, background of sclerosing lymphocytic lobulitis, fibrocystic changes, E 99%, PR 97%, HER-2 negative, Ki-67 7%, T1 N0 M0 stage IA    CHIEF COMPLIANT: Follow-up after surgery for breast cancer  INTERVAL HISTORY: Carolyn Rowland is a 74 year old lady with above-mentioned history of right-sided breast cancer treated with lumpectomy and is here for a follow-up. She reports that she is healing very well. She complains of blue colored urine. She also complains of soreness in the breast. Other than that she is without any problems or concerns.  REVIEW OF SYSTEMS:   Constitutional: Denies fevers, chills or abnormal weight loss Eyes: Denies blurriness of vision Ears, nose, mouth, throat, and face: Denies mucositis or sore throat Respiratory: Denies cough, dyspnea or wheezes Cardiovascular: Denies palpitation, chest discomfort or lower extremity swelling Gastrointestinal:  Denies nausea, heartburn or change in bowel habits Skin: Denies abnormal skin rashes Lymphatics: Denies new lymphadenopathy or easy bruising Neurological:Denies numbness, tingling or new weaknesses Behavioral/Psych: Mood is stable, no new changes  Breast:  Soreness in the breast from recent surgery All other systems were reviewed with the patient and are negative.  I  have reviewed the past medical history, past surgical history, social history and family history with the patient and they are unchanged from previous note.  ALLERGIES:  is allergic to penicillins.  MEDICATIONS:  Current Outpatient Prescriptions  Medication Sig Dispense Refill  . acetaminophen (TYLENOL) 650 MG CR tablet Take 1,300 mg by mouth every 8 (eight) hours as needed for pain.    Marland Kitchen anastrozole (ARIMIDEX) 1 MG tablet Take 1 tablet (1 mg total) by mouth daily. 90 tablet 3  . aspirin 81 MG chewable tablet Chew 1 tablet (81 mg total) by mouth daily.    Marland Kitchen atorvastatin (LIPITOR) 80 MG tablet Take 1 tablet (80 mg total) by mouth daily. 30 tablet 1  . gabapentin (NEURONTIN) 300 MG capsule Take 1 capsule (300 mg total) by mouth at bedtime. 30 capsule 0  . HYDROcodone-acetaminophen (NORCO/VICODIN) 5-325 MG per tablet Take 1-2 tablets by mouth every 4 (four) hours as needed for moderate pain or severe pain. 40 tablet 0  . isosorbide mononitrate (IMDUR) 60 MG 24 hr tablet Take 1 tablet (60 mg total) by mouth daily. 30 tablet 1  . metoprolol succinate (TOPROL-XL) 50 MG 24 hr tablet Take 50 mg by mouth daily. Take with or immediately following a meal.    . Multiple Vitamins-Minerals (MULTIVITAMIN WITH MINERALS) tablet Take 1 tablet by mouth daily.    . nitroGLYCERIN (NITROSTAT) 0.4 MG SL tablet Place 1 tablet (0.4 mg total) under the tongue every 5 (five) minutes as needed for chest pain. 30 tablet 1  . omeprazole (PRILOSEC) 40 MG capsule Take 40 mg by mouth daily.    Marland Kitchen spironolactone (ALDACTONE) 25 MG tablet Take 25 mg by mouth daily.    . traMADol Veatrice Bourbon)  50 MG tablet Take 2 tablets (100 mg total) by mouth every 8 (eight) hours as needed. (Patient taking differently: Take 100 mg by mouth every 8 (eight) hours as needed for moderate pain. ) 30 tablet 0   No current facility-administered medications for this visit.    PHYSICAL EXAMINATION: ECOG PERFORMANCE STATUS: 0 - Asymptomatic  Filed Vitals:    10/25/14 0820  BP: 110/70  Pulse: 85  Temp: 97.7 F (36.5 C)  Resp: 18   Filed Weights   10/25/14 0820  Weight: 216 lb 12.8 oz (98.34 kg)    GENERAL:alert, no distress and comfortable SKIN: skin color, texture, turgor are normal, no rashes or significant lesions EYES: normal, Conjunctiva are pink and non-injected, sclera clear OROPHARYNX:no exudate, no erythema and lips, buccal mucosa, and tongue normal  NECK: supple, thyroid normal size, non-tender, without nodularity LYMPH:  no palpable lymphadenopathy in the cervical, axillary or inguinal LUNGS: clear to auscultation and percussion with normal breathing effort HEART: regular rate & rhythm and no murmurs and no lower extremity edema ABDOMEN:abdomen soft, non-tender and normal bowel sounds Musculoskeletal:no cyanosis of digits and no clubbing  NEURO: alert & oriented x 3 with fluent speech, no focal motor/sensory deficits   LABORATORY DATA:  I have reviewed the data as listed   Chemistry      Component Value Date/Time   NA 140 10/16/2014 1315   K 4.4 10/16/2014 1315   CL 106 10/16/2014 1315   CO2 27 10/16/2014 1315   BUN 15 10/16/2014 1315   CREATININE 0.86 10/16/2014 1315      Component Value Date/Time   CALCIUM 9.2 10/16/2014 1315   ALKPHOS 54 10/16/2014 1315   AST 20 10/16/2014 1315   ALT 14 10/16/2014 1315   BILITOT 0.7 10/16/2014 1315       Lab Results  Component Value Date   WBC 5.0 10/16/2014   HGB 13.4 10/16/2014   HCT 39.8 10/16/2014   MCV 79.6 10/16/2014   PLT 195 10/16/2014   NEUTROABS 2.6 10/16/2014     ASSESSMENT & PLAN:  Breast cancer of upper-outer quadrant of right female breast Right breast lumpectomy 10/17/2014: 0.28 cm focus of residual IDC grade 2 with papillary features, 0/1 sentinel node, background of sclerosing lymphocytic lobulitis, fibrocystic changes, E 99%, PR 97%, HER-2 negative, Ki-67 7%, T1a N0 M0 stage IA  Pathology review: I discussed the final pathology report in  great detail and provided her with a copy of this result. The final tumor size was only 0.28 cm. She has very favorable prognostic type.  Recommendation: 1. Radiation therapy followed by 2. Adjuvant antiestrogen therapy  Aromatase inhibitor counseling:We discussed the risks and benefits of anti-estrogen therapy with aromatase inhibitors. These include but not limited to insomnia, hot flashes, mood changes, vaginal dryness, bone density loss, and weight gain. Although rare, serious side effects including, risk of blood clots were also discussed. We strongly believe that the benefits far outweigh the risks. Patient understands these risks and consented to starting treatment. Planned treatment duration is 5 years. I provided her with a prescription for anastrozole. She will started 2 weeks after radiation is complete.  We will send a referral for radiation oncology I instructed her to start anastrozole 2 weeks after finishing radiation approximately mid June. Return to clinic in mid August to assess side effects anastrozole.   No orders of the defined types were placed in this encounter.   The patient has a good understanding of the overall plan. she agrees with  it. She will call with any problems that may develop before her next visit here.   Rulon Eisenmenger, MD

## 2014-10-25 NOTE — Addendum Note (Signed)
Addended by: Prentiss Bells on: 10/25/2014 09:05 AM   Modules accepted: Orders, Medications

## 2014-10-28 ENCOUNTER — Encounter: Payer: Self-pay | Admitting: General Surgery

## 2014-10-28 NOTE — Progress Notes (Unsigned)
Pathology of her breast cancer is T1aN0 as below, ER/PR positive.  Results discussed with her.  Diagnosis 1. Lymph node, sentinel, biopsy, Right - ONE BENIGN LYMPH NODE WITH NO TUMOR SEEN (0/1). 2. Breast, lumpectomy, Right - 0.28 CM FOCUS OF RESIDUAL INVASIVE GRADE II DUCTAL CARCINOMA WITH PAPILLARY FEATURES AND ASSOCIATED MICROCALCIFICATION. - MARGINS ARE NEGATIVE. - SEE ONCOLOGY TEMPLATE.

## 2014-10-30 NOTE — Progress Notes (Signed)
Location of Breast Cancer:Right upper-outer quadrant, 1.8 cm .  Histology per Pathology Report:10/17/2014 Diagnosis 1. Lymph node, sentinel, biopsy, Right - ONE BENIGN LYMPH NODE WITH NO TUMOR SEEN (0/1). 2. Breast, lumpectomy, Right - 0.28 CM FOCUS OF RESIDUAL INVASIVE GRADE II DUCTAL CARCINOMA WITH PAPILLARY FEATURES AND ASSOCIATED MICROCALCIFICATION. - MARGINS ARE NEGATIVE. - SEE ONCOLOGY TEMPLATE.  Receptor Status: ER(+), PR (+), Her2-neu (-)  Did patient present with symptoms (if so, please note symptoms) or was this found on screening mammography?:Found on mammogram. 08/07/14: right breast biopsy 08/20/14:breast mri  Past/Anticipated interventions by surgeon, if any:10/17/2014 BREAST LUMPECTOMY WITH NEEDLE LOCALIZATION AND AXILLARY SENTINEL LYMPH NODE BX  Past/Anticipated interventions by medical oncology, if any: Chemotherapy not reccommended. Plan to start anastrozole 2 weeks after completion of radiation and follow up with Dr. Lindi Adie in August to assess side effects.  Lymphedema issues, if any: No   Pain issues, if any: Chronic leg pain  SAFETY ISSUES:  Prior radiation? No  Pacemaker/ICD? No  Possible current pregnancy?NoHysterectomy at age 74.  Is the patient on methotrexate?No   Current Complaints / other details: Menarche age 74.Gravida 3 Para 3. Menopause age 74. First child at age 74.Took premarin about 10 years. Quit smoking 6 months ago.No alcohol or illicit drugs. Allergies:penicillin    Arlyss Repress, RN 10/30/2014,10:52 AM

## 2014-10-31 ENCOUNTER — Encounter: Payer: Self-pay | Admitting: *Deleted

## 2014-10-31 ENCOUNTER — Ambulatory Visit
Admission: RE | Admit: 2014-10-31 | Discharge: 2014-10-31 | Disposition: A | Discharge status: Home / Self Care | Payer: Commercial Managed Care - HMO | Source: Ambulatory Visit | Attending: Radiation Oncology | Admitting: Radiation Oncology

## 2014-10-31 ENCOUNTER — Encounter: Payer: Self-pay | Admitting: Adult Health

## 2014-10-31 ENCOUNTER — Telehealth: Payer: Self-pay | Admitting: Adult Health

## 2014-10-31 VITALS — BP 163/63 | HR 80 | Temp 97.9°F | Resp 12 | Wt 217.0 lb

## 2014-10-31 DIAGNOSIS — C50411 Malignant neoplasm of upper-outer quadrant of right female breast: Secondary | ICD-10-CM | POA: Diagnosis not present

## 2014-10-31 MED ORDER — DOXYCYCLINE HYCLATE 100 MG PO TABS: 100.0000 mg | ORAL_TABLET | Freq: Two times a day (BID) | ORAL | Status: DC

## 2014-10-31 NOTE — Progress Notes (Signed)
Please see the Nurse Progress Note in the MD Initial Consult Encounter for this patient. 

## 2014-10-31 NOTE — Telephone Encounter (Signed)
Ms. Drakeford called me with a question regarding her medications.  She tells me that she remembers Dr. Lindi Adie telling her not to start the anastrazole now, but was confused about when to actually start the medication.  Of note, she currently has a breast infection and was prescribed Doxycycline by Dr. Pablo Ledger today at the recommendation of Dr. Zella Richer.  These 2 new medications were contributing to the patient's confusion.   I reiterated that she would not start her anti-estrogen therapy (anastrazole) until about 2-3 weeks after she finishes radiation.  She should begin taking the antibiotic (doxycycline) as soon as possible and take it twice daily with food.  She expressed verbal understanding.  I encouraged her to call me again with any other questions or concerns.    Mike Craze, NP [place] [phone]

## 2014-10-31 NOTE — Progress Notes (Addendum)
Radiation Oncology         [phone]) [phone] ________________________________  Initial outpatient Consultation - Date: 8/92/1194   Name: Carolyn Rowland MRN: [id number]   DOB: [birth year]  REFERRING PHYSICIAN: Nicholas Lose, MD  DIAGNOSIS AND STAGE: Stage I Right Breast Cancer  HISTORY OF PRESENT ILLNESS:: INTERVAL HISTORY: Carolyn Rowland is a 74 year old female with a routine mammogram with a new cluster of calcification in the right breast. She underwent a biopsy on 08/07/14 which revealed invasive ductile carcinoma with papillary feature with associated microcalcifications. This was ER/PR+, HER2-, with a Ki67 of 7%. She elected for breast conservation and had that performed on 10/07/14. This showed a 0.28cm area of invasive ductile carinoma which was grade 2, margins were -, and 1 sentinel lymph node was - for metastatic disease. This was ER+ at 99%, PR+ at 97%, and HER2-. The closest margin was 0.4 cm. She has healed up well from surgery. She met with Dr. Lindi Rowland and has been prescribed an aromatase inhibitor. She was sent to me for consideration of radiation for the management of her disease.  She has recovered well from her surgery.  She is still limited in her range of motion and has not been released by Dr. Zella Rowland.  She is interested in our ABC class.   PREVIOUS RADIATION THERAPY: No  Past medical, social and family history were reviewed in the electronic chart. Review of symptoms was reviewed in the electronic chart. Medications were reviewed in the electronic chart.   PHYSICAL EXAM:  Filed Vitals:   10/31/14 0908  BP: 163/63  Pulse: 80  Temp: 97.9 F (36.6 C)  Resp: 12  .217 lb (98.431 kg). Pleasant female.  Healing right breast incision and sentinel node incision with tenderness to palpation and redness over the lower aspect of the right breast. She has edema of the breast as well.   IMPRESSION: Stage I right breast cancer.  PLAN: I spoke with Carolyn Rowland today about the role of  radiation and decreasing local failures and patients who undergo lumpectomy given the small tumor size as well as the negative margins and papillary feature. I think the risk of recurrence at her age is low. The randomized trials show no difference in survival in patients who undergo radiation plus antiestrogen therapy vs antiestrogen therapy alone. We discussed the process of simulation and the placement tattoos. We discussed 5 weeks of treatment as an outpatient. We discussed the possibility of asymptomatic lung damage. We discussed the low likelihood of secondary malignancies. We discussed the possible side effects including but not limited to skin redness, fatigue, permanent skin darkening, and breast swelling.   I am worried that she has a wound infection.  I have discussed with Dr. Zella Rowland and  started her on doxycycline 100 mg bid and will forward this note to Dr. Kyra Rowland as well. She knows to call his office or ours if she develops fever or worsening pain.   She will be scheduled for simulation the first week of May and start 1 month from her surgery. I encouraged her to discuss the ABC class with Dr. Zella Rowland.  She met with our survivorship navigator today as well.  I spent 40 minutes  face to face with the patient and more than 50% of that time was spent in counseling and/or coordination of care.   This document serves as a record of services personally performed by Carolyn Silversmith, MD. It was created on her behalf by Carolyn Rowland, a trained medical  scribe. The creation of this record is based on the scribe's personal observations and the provider's statements to them. This document has been checked and approved by the attending provider.   ------------------------------------------------  Carolyn Silversmith, MD

## 2014-10-31 NOTE — Progress Notes (Signed)
I briefly met with Carolyn Rowland during her Radiation Oncology consultation today with Dr. Pablo Ledger. We discussed the purpose of the Survivorship Clinic, which will include monitoring for recurrence, coordinating completion of age and gender-appropriate cancer screenings, promotion of overall wellness, as well as managing potential late/long-term side effects of anti-cancer treatments.    As of today, the intent of treatment for Carolyn Rowland is cure. Therefore, she will be eligible for the Survivorship Clinic upon her completion of treatment.  Her survivorship care plan (SCP) document will be drafted and updated throughout the course of her treatment trajectory. She will receive the SCP in an office visit with myself in the Survivorship Clinic once she has completed treatment.   Carolyn Rowland was encouraged to ask questions and all questions were answered to her satisfaction.  She was given my business card and encouraged to contact me with any concerns regarding survivorship.  I look forward to participating in her care.   Mike Craze, NP [place] [phone]

## 2014-10-31 NOTE — Addendum Note (Signed)
Encounter addended by: Norm Salt, RN on: 10/31/2014 10:57 AM<BR>     Documentation filed: Charges VN

## 2014-10-31 NOTE — Progress Notes (Signed)
Met with pt during post op visit with Dr. Pablo Ledger. Relate she is doing "ok", but that it appears her breast is infected. Dr.Wentworth is reaching out to Dr. Zella Richer concerning infection. Encourage pt to call with questions or concerns. Received verbal understanding.

## 2014-11-04 ENCOUNTER — Encounter: Payer: Self-pay | Admitting: General Surgery

## 2014-11-04 NOTE — Progress Notes (Signed)
Carolyn Rowland 11/04/2014 1:47 PM Location: [facility] Patient #: [id number] DOB: 06/06/1941 Married / Language: English / Race: Black or African American Female History of Present Illness [name] MD; 11/04/2014 2:30 PM) Patient words: post-op breast.  The patient is a 74 year old female    Note:Procedure: Blue dye injection into right breast. Right axillary lymphatic mapping. Right partial mastectomy after wire localization. Right axillary sentinel lymph node biopsy.  Date: 10/17/14  Pathology: T1aN0 with negative margins, ER/PR positive, Her-2 neu negative, Ki-67 = 7/%  History: She is here for her first postoperative visit. She has seen Dr. Lindi Adie and Dr. Pablo Ledger. Dr. Pablo Ledger was concerned about infection and called me. I recommended that she be started on doxycycline and she's been taking that. She states the redness is better.  Exam: General- Is in NAD. Breasts-right breast incision is clean and intact. Right axillary incision is clean and intact. Around the nipple area there is a rim of erythema that is approximately 2 cm from the nipple areolar complex circumferentially.  Allergies (Sonya Bynum, CMA; 11/04/2014 1:48 PM) Penicillamine *ASSORTED CLASSES*  Medication History (Sonya Bynum, CMA; 11/04/2014 1:48 PM) Gabapentin (300MG  Capsule, Oral) Active. Isosorbide Mononitrate ER (60MG  Tablet ER 24HR, Oral) Active. Metoprolol Succinate ER (50MG  Tablet ER 24HR, Oral) Active. Cyclobenzaprine HCl (5MG  Tablet, Oral) Active. Atorvastatin Calcium (80MG  Tablet, Oral) Active. Spironolactone (25MG  Tablet, Oral) Active. Medications Reconciled    Vitals (Sonya Bynum CMA; 11/04/2014 1:48 PM) 11/04/2014 1:47 PM Weight: 216 lb Height: 65in Body Surface Area: 2.12 m Body Mass Index: 35.94 kg/m Temp.: 97.32F(Temporal)  Pulse: 88 (Regular)  BP: 130/76 (Sitting, Left Arm, Standard)     Assessment & Plan [name] MD; 11/04/2014  2:29 PM)  MALIGNANT NEOPLASM OF UPPER-OUTER QUADRANT OF RIGHT FEMALE BREAST (174.4  C50.411)  S/P PARTIAL MASTECTOMY, RIGHT (V45.71  Z90.11) Impression: Redness could be secondary to an infection or reaction to the blue dye injection. She is taking doxycycline and thinks the area is better. It certainly is no worse. She is not running fever.  Plan: Continue antibiotics. Return visit one week.  Jackolyn Confer, MD

## 2014-11-07 ENCOUNTER — Ambulatory Visit
Admission: RE | Admit: 2014-11-07 | Discharge: 2014-11-07 | Disposition: A | Discharge status: Home / Self Care | Payer: Commercial Managed Care - HMO | Source: Ambulatory Visit | Attending: Radiation Oncology | Admitting: Radiation Oncology

## 2014-11-07 ENCOUNTER — Encounter (HOSPITAL_COMMUNITY): Payer: Self-pay | Admitting: *Deleted

## 2014-11-07 ENCOUNTER — Emergency Department (HOSPITAL_COMMUNITY)
Admission: EM | Admit: 2014-11-07 | Discharge: 2014-11-07 | Disposition: A | Discharge status: Home / Self Care | Payer: Commercial Managed Care - HMO | Attending: Emergency Medicine | Admitting: Emergency Medicine

## 2014-11-07 DIAGNOSIS — Z7982 Long term (current) use of aspirin: Secondary | ICD-10-CM | POA: Diagnosis not present

## 2014-11-07 DIAGNOSIS — Z8701 Personal history of pneumonia (recurrent): Secondary | ICD-10-CM | POA: Diagnosis not present

## 2014-11-07 DIAGNOSIS — Z79899 Other long term (current) drug therapy: Secondary | ICD-10-CM | POA: Insufficient documentation

## 2014-11-07 DIAGNOSIS — Z9861 Coronary angioplasty status: Secondary | ICD-10-CM | POA: Diagnosis not present

## 2014-11-07 DIAGNOSIS — I10 Essential (primary) hypertension: Secondary | ICD-10-CM | POA: Insufficient documentation

## 2014-11-07 DIAGNOSIS — Z87891 Personal history of nicotine dependence: Secondary | ICD-10-CM | POA: Insufficient documentation

## 2014-11-07 DIAGNOSIS — Z9889 Other specified postprocedural states: Secondary | ICD-10-CM | POA: Insufficient documentation

## 2014-11-07 DIAGNOSIS — R404 Transient alteration of awareness: Secondary | ICD-10-CM | POA: Diagnosis not present

## 2014-11-07 DIAGNOSIS — M1990 Unspecified osteoarthritis, unspecified site: Secondary | ICD-10-CM | POA: Diagnosis not present

## 2014-11-07 DIAGNOSIS — K219 Gastro-esophageal reflux disease without esophagitis: Secondary | ICD-10-CM | POA: Insufficient documentation

## 2014-11-07 DIAGNOSIS — E780 Pure hypercholesterolemia: Secondary | ICD-10-CM | POA: Diagnosis not present

## 2014-11-07 DIAGNOSIS — H8112 Benign paroxysmal vertigo, left ear: Secondary | ICD-10-CM

## 2014-11-07 DIAGNOSIS — Z880 Allergy status to penicillin: Secondary | ICD-10-CM | POA: Insufficient documentation

## 2014-11-07 DIAGNOSIS — R42 Dizziness and giddiness: Secondary | ICD-10-CM | POA: Diagnosis not present

## 2014-11-07 DIAGNOSIS — R011 Cardiac murmur, unspecified: Secondary | ICD-10-CM | POA: Diagnosis not present

## 2014-11-07 HISTORY — DX: Dizziness and giddiness: R42

## 2014-11-07 LAB — URINALYSIS, ROUTINE W REFLEX MICROSCOPIC
Bilirubin Urine: NEGATIVE
Glucose, UA: NEGATIVE mg/dL
Hgb urine dipstick: NEGATIVE
Ketones, ur: NEGATIVE mg/dL
Leukocytes, UA: NEGATIVE
Nitrite: NEGATIVE
Protein, ur: NEGATIVE mg/dL
Specific Gravity, Urine: 1.018 (ref 1.005–1.030)
Urobilinogen, UA: 0.2 mg/dL (ref 0.0–1.0)
pH: 6.5 (ref 5.0–8.0)

## 2014-11-07 LAB — CBC WITH DIFFERENTIAL/PLATELET
Basophils Absolute: 0 10*3/uL (ref 0.0–0.1)
Basophils Relative: 0 % (ref 0–1)
Eosinophils Absolute: 0.1 10*3/uL (ref 0.0–0.7)
Eosinophils Relative: 1 % (ref 0–5)
HCT: 37.2 % (ref 36.0–46.0)
Hemoglobin: 13 g/dL (ref 12.0–15.0)
Lymphocytes Relative: 38 % (ref 12–46)
Lymphs Abs: 2.6 10*3/uL (ref 0.7–4.0)
MCH: 27.5 pg (ref 26.0–34.0)
MCHC: 34.9 g/dL (ref 30.0–36.0)
MCV: 78.6 fL (ref 78.0–100.0)
Monocytes Absolute: 0.6 10*3/uL (ref 0.1–1.0)
Monocytes Relative: 9 % (ref 3–12)
Neutro Abs: 3.6 10*3/uL (ref 1.7–7.7)
Neutrophils Relative %: 52 % (ref 43–77)
Platelets: 216 10*3/uL (ref 150–400)
RBC: 4.73 MIL/uL (ref 3.87–5.11)
RDW: 16.2 % — ABNORMAL HIGH (ref 11.5–15.5)
WBC: 6.9 10*3/uL (ref 4.0–10.5)

## 2014-11-07 LAB — COMPREHENSIVE METABOLIC PANEL
ALT: 14 U/L (ref 14–54)
AST: 23 U/L (ref 15–41)
Albumin: 4 g/dL (ref 3.5–5.0)
Alkaline Phosphatase: 65 U/L (ref 38–126)
Anion gap: 7 (ref 5–15)
BUN: 16 mg/dL (ref 6–20)
CO2: 28 mmol/L (ref 22–32)
Calcium: 9.2 mg/dL (ref 8.9–10.3)
Chloride: 108 mmol/L (ref 101–111)
Creatinine, Ser: 0.88 mg/dL (ref 0.44–1.00)
GFR calc Af Amer: >60 mL/min (ref >60)
GFR calc non Af Amer: >60 mL/min (ref >60)
Glucose, Bld: 111 mg/dL — ABNORMAL HIGH (ref 70–99)
Potassium: 3.7 mmol/L (ref 3.5–5.1)
Sodium: 143 mmol/L (ref 135–145)
Total Bilirubin: 0.8 mg/dL (ref 0.3–1.2)
Total Protein: 7.4 g/dL (ref 6.5–8.1)

## 2014-11-07 LAB — TROPONIN I: Troponin I: <0.03 ng/mL (ref <0.031)

## 2014-11-07 MED ORDER — MECLIZINE HCL 25 MG PO TABS
25.0000 mg | ORAL_TABLET | Freq: Once | ORAL | Status: AC
  Administered 2014-11-07: 25 mg via ORAL
  Filled 2014-11-07: qty 1

## 2014-11-07 MED ORDER — MECLIZINE HCL 25 MG PO TABS: 25.0000 mg | ORAL_TABLET | Freq: Three times a day (TID) | ORAL | Status: DC | PRN

## 2014-11-07 NOTE — ED Provider Notes (Signed)
CSN: [id number]     Arrival date & time 11/07/14  0044 History   First MD Initiated Contact with Patient 11/07/14 0158     Chief Complaint  Patient presents with  . Dizziness     (Consider location/radiation/quality/duration/timing/severity/associated sxs/prior Treatment) HPI 74 year old female presents to emergency department with complaint of dizziness.  Patient reports she was laying in bed, and suddenly turned onto her left side.  She reports with that the room began to spin.  She laid back on her back and symptoms slowly resolved.  Symptoms have returned each time she tries to move.  She reports history of vertigo once before while sitting at a computer, none since then.  She denies any headache, no visual changes, no focal weakness or numbness.  She has had mild nausea but no vomiting.  Patient has history of hypertension, elevated cholesterol, and recent lumpectomy for a breast cancer. Past Medical History  Diagnosis Date  . Heart murmur   . Hypertension   . High cholesterol   . Pneumonia 1970's    "once"  . H/O hiatal hernia   . GERD (gastroesophageal reflux disease)   . Arthritis     "hands; legs" (08/17/2013)  . CAP (community acquired pneumonia)   . Aortic stenosis     mild to moderate AS 07/2013  . Vertigo    Past Surgical History  Procedure Laterality Date  . Cardiac catheterization  08/07/2013  . Coronary angioplasty with stent placement  08/17/2013    "6 stents"(08/17/2013)  . Appendectomy  1960  . Abdominal hysterectomy  1985  . Tubal ligation  1980  . Left and right heart catheterization with coronary angiogram N/A 08/07/2013    Procedure: LEFT AND RIGHT HEART CATHETERIZATION WITH CORONARY ANGIOGRAM;  Surgeon: Laverda Page, MD;  Location: [facility] CATH LAB;  Service: Cardiovascular;  Laterality: N/A;  . Percutaneous coronary stent intervention (pci-s) N/A 08/17/2013    Procedure: PERCUTANEOUS CORONARY STENT INTERVENTION (PCI-S);  Surgeon: Laverda Page, MD;  Location:  [facility] CATH LAB;  Service: Cardiovascular;  Laterality: N/A;  . Breast lumpectomy with needle localization and axillary sentinel lymph node bx Right 10/17/2014    Procedure: BREAST LUMPECTOMY WITH NEEDLE LOCALIZATION AND AXILLARY SENTINEL LYMPH NODE BX;  Surgeon: Jackolyn Confer, MD;  Location: [place];  Service: General;  Laterality: Right;   Family History  Problem Relation Age of Onset  . Cancer Brother   . Cancer Brother   . Heart attack Brother    History  Substance Use Topics  . Smoking status: Former Smoker -- 1.00 packs/day for 52 years    Types: Cigarettes    Quit date: 04/04/2014  . Smokeless tobacco: Never Used  . Alcohol Use: No     Comment: 08/17/2013 "quit drinking at age 50; never did drink much"   OB History    No data available     Review of Systems  See History of Present Illness; otherwise all other systems are reviewed and negative   Allergies  Penicillins  Home Medications   Prior to Admission medications   Medication Sig Start Date End Date Taking? Authorizing Provider  acetaminophen (TYLENOL) 650 MG CR tablet Take 1,300 mg by mouth every 8 (eight) hours as needed for pain.    Historical Provider, MD  anastrozole (ARIMIDEX) 1 MG tablet Take 1 tablet (1 mg total) by mouth daily. Patient not taking: Reported on 10/31/2014 10/25/14   Nicholas Lose, MD  aspirin 81 MG chewable tablet Chew 1 tablet (81 mg  total) by mouth daily. 08/19/13   Adrian Prows, MD  atorvastatin (LIPITOR) 80 MG tablet Take 1 tablet (80 mg total) by mouth daily. 08/07/13   Adrian Prows, MD  cefdinir (OMNICEF) 300 MG capsule  09/06/14   Historical Provider, MD  cyclobenzaprine (FLEXERIL) 5 MG tablet  09/23/14   Historical Provider, MD  doxycycline (VIBRA-TABS) 100 MG tablet Take 1 tablet (100 mg total) by mouth 2 (two) times daily. 10/31/14   Thea Silversmith, MD  gabapentin (NEURONTIN) 300 MG capsule Take 1 capsule (300 mg total) by mouth at bedtime. 09/28/14   Melony Overly, MD  HYDROcodone-acetaminophen  (NORCO/VICODIN) 5-325 MG per tablet Take 1-2 tablets by mouth every 4 (four) hours as needed for moderate pain or severe pain. 10/17/14   Jackolyn Confer, MD  isosorbide mononitrate (IMDUR) 60 MG 24 hr tablet Take 1 tablet (60 mg total) by mouth daily. 08/07/13   Adrian Prows, MD  meclizine (ANTIVERT) 25 MG tablet Take 1 tablet (25 mg total) by mouth 3 (three) times daily as needed for dizziness. 11/07/14   Linton Flemings, MD  metoprolol succinate (TOPROL-XL) 50 MG 24 hr tablet Take 50 mg by mouth daily. Take with or immediately following a meal.    Historical Provider, MD  Multiple Vitamins-Minerals (MULTIVITAMIN WITH MINERALS) tablet Take 1 tablet by mouth daily.    Historical Provider, MD  nitroGLYCERIN (NITROSTAT) 0.4 MG SL tablet Place 1 tablet (0.4 mg total) under the tongue every 5 (five) minutes as needed for chest pain. 08/07/13   Adrian Prows, MD  omeprazole (PRILOSEC) 40 MG capsule Take 40 mg by mouth daily.    Historical Provider, MD  spironolactone (ALDACTONE) 25 MG tablet Take 25 mg by mouth daily.    Historical Provider, MD  traMADol (ULTRAM) 50 MG tablet Take 2 tablets (100 mg total) by mouth every 8 (eight) hours as needed. Patient taking differently: Take 100 mg by mouth every 8 (eight) hours as needed for moderate pain.  06/03/14   Harden Mo, MD   BP 137/77 mmHg  Pulse 90  Temp(Src) 98 F (36.7 C) (Oral)  Resp 19  SpO2 100% Physical Exam  Constitutional: She is oriented to person, place, and time. She appears well-developed and well-nourished.  HENT:  Head: Normocephalic and atraumatic.  Right Ear: External ear normal.  Left Ear: External ear normal.  Nose: Nose normal.  Mouth/Throat: Oropharynx is clear and moist.  Eyes: Conjunctivae and EOM are normal. Pupils are equal, round, and reactive to light.  Neck: Normal range of motion. Neck supple. No JVD present. No tracheal deviation present. No thyromegaly present.  Cardiovascular: Normal rate, regular rhythm, normal heart sounds and  intact distal pulses.  Exam reveals no gallop and no friction rub.   No murmur heard. Pulmonary/Chest: Effort normal and breath sounds normal. No stridor. No respiratory distress. She has no wheezes. She has no rales. She exhibits no tenderness.  Abdominal: Soft. Bowel sounds are normal. She exhibits no distension and no mass. There is no tenderness. There is no rebound and no guarding.  Musculoskeletal: Normal range of motion. She exhibits no edema or tenderness.  Lymphadenopathy:    She has no cervical adenopathy.  Neurological: She is alert and oriented to person, place, and time. She displays normal reflexes. No cranial nerve deficit. She exhibits normal muscle tone. Coordination normal.  Patient has normal neurologic exam aside from becoming vertiginous with turning head to left.  No nystagmus noted with movement of the head to the left.  Fatigues with time  Skin: Skin is warm and dry. No rash noted. No erythema. No pallor.  Psychiatric: She has a normal mood and affect. Her behavior is normal. Judgment and thought content normal.  Nursing note and vitals reviewed.   ED Course  Procedures (including critical care time) Labs Review Labs Reviewed  COMPREHENSIVE METABOLIC PANEL - Abnormal; Notable for the following:    Glucose, Bld 111 (*)    All other components within normal limits  CBC WITH DIFFERENTIAL/PLATELET - Abnormal; Notable for the following:    RDW 16.2 (*)    All other components within normal limits  URINALYSIS, ROUTINE W REFLEX MICROSCOPIC  TROPONIN I    Imaging Review No results found.   EKG Interpretation   Date/Time:  Thursday Nov 07 2014 00:47:33 EDT Ventricular Rate:  86 PR Interval:  174 QRS Duration: 88 QT Interval:  382 QTC Calculation: 457 R Axis:   50 Text Interpretation:  Sinus rhythm Probable left atrial enlargement  Confirmed by Novalyn Lajara  MD, Roy Tokarz ([id number]) on 11/07/2014 1:56:48 AM      MDM   Final diagnoses:  Benign paroxysmal vertigo, left     74 year old female with history and physical consistent with benign paroxysmal positional vertigo.  She has history of same before.  I doubt that this is secondary to a posterior circulation stroke.  Plan to treat with meclizine and follow-up with primary care doctor, and/or ENT.  Patient given outpatient instructions for home Epley maneuver    Linton Flemings, MD 11/07/14 [phone]

## 2014-11-07 NOTE — ED Notes (Signed)
Per EMS pt from home, started having dizziness when head moving or tries to pick head up about an hour ago, denies pain, has a hx of vertigo.

## 2014-11-07 NOTE — Discharge Instructions (Signed)
Benign Positional Vertigo °Vertigo means you feel like you or your surroundings are moving when they are not. Benign positional vertigo is the most common form of vertigo. Benign means that the cause of your condition is not serious. Benign positional vertigo is more common in older adults. °CAUSES  °Benign positional vertigo is the result of an upset in the labyrinth system. This is an area in the middle ear that helps control your balance. This may be caused by a viral infection, head injury, or repetitive motion. However, often no specific cause is found. °SYMPTOMS  °Symptoms of benign positional vertigo occur when you move your head or eyes in different directions. Some of the symptoms may include: °1. Loss of balance and falls. °2. Vomiting. °3. Blurred vision. °4. Dizziness. °5. Nausea. °6. Involuntary eye movements (nystagmus). °DIAGNOSIS  °Benign positional vertigo is usually diagnosed by physical exam. If the specific cause of your benign positional vertigo is unknown, your caregiver may perform imaging tests, such as magnetic resonance imaging (MRI) or computed tomography (CT). °TREATMENT  °Your caregiver may recommend movements or procedures to correct the benign positional vertigo. Medicines such as meclizine, benzodiazepines, and medicines for nausea may be used to treat your symptoms. In rare cases, if your symptoms are caused by certain conditions that affect the inner ear, you may need surgery. °HOME CARE INSTRUCTIONS  °· Follow your caregiver's instructions. °· Move slowly. Do not make sudden body or head movements. °· Avoid driving. °· Avoid operating heavy machinery. °· Avoid performing any tasks that would be dangerous to you or others during a vertigo episode. °· Drink enough fluids to keep your urine clear or pale yellow. °SEEK IMMEDIATE MEDICAL CARE IF:  °· You develop problems with walking, weakness, numbness, or using your arms, hands, or legs. °· You have difficulty speaking. °· You develop  severe headaches. °· Your nausea or vomiting continues or gets worse. °· You develop visual changes. °· Your family or friends notice any behavioral changes. °· Your condition gets worse. °· You have a fever. °· You develop a stiff neck or sensitivity to light. °MAKE SURE YOU:  °· Understand these instructions. °· Will watch your condition. °· Will get help right away if you are not doing well or get worse. °Document Released: 03/29/2006 Document Revised: 09/13/2011 Document Reviewed: 03/11/2011 °ExitCare® Patient Information ©2015 [employer]. This information is not intended to replace advice given to you by your health care provider. Make sure you discuss any questions you have with your health care provider. ° °Epley Maneuver Self-Care °WHAT IS THE EPLEY MANEUVER? °The Epley maneuver is an exercise you can do to relieve symptoms of benign paroxysmal positional vertigo (BPPV). This condition is often just referred to as vertigo. BPPV is caused by the movement of tiny crystals (canaliths) inside your inner ear. The accumulation and movement of canaliths in your inner ear causes a sudden spinning sensation (vertigo) when you move your head to certain positions. Vertigo usually lasts about 30 seconds. BPPV usually occurs in just one ear. If you get vertigo when you lie on your left side, you probably have BPPV in your left ear. Your health care provider can tell you which ear is involved.  °BPPV may be caused by a head injury. Many people older than 50 get BPPV for unknown reasons. If you have been diagnosed with BPPV, your health care provider may teach you how to do this maneuver. BPPV is not life threatening (benign) and usually goes away in time.  °  WHEN SHOULD I PERFORM THE EPLEY MANEUVER? °You can do this maneuver at home whenever you have symptoms of vertigo. You may do the Epley maneuver up to 3 times a day until your symptoms of vertigo go away. °HOW SHOULD I DO THE EPLEY MANEUVER? °7. Sit on the edge of a  bed or table with your back straight. Your legs should be extended or hanging over the edge of the bed or table.   °8. Turn your head halfway toward the affected ear.   °9. Lie backward quickly with your head turned until you are lying flat on your back. You may want to position a pillow under your shoulders.   °10. Hold this position for 30 seconds. You may experience an attack of vertigo. This is normal. Hold this position until the vertigo stops. °11. Then turn your head to the opposite direction until your unaffected ear is facing the floor.   °12. Hold this position for 30 seconds. You may experience an attack of vertigo. This is normal. Hold this position until the vertigo stops. °13. Now turn your whole body to the same side as your head. Hold for another 30 seconds.   °14. You can then sit back up. °ARE THERE RISKS TO THIS MANEUVER? °In some cases, you may have other symptoms (such as changes in your vision, weakness, or numbness). If you have these symptoms, stop doing the maneuver and call your health care provider. Even if doing these maneuvers relieves your vertigo, you may still have dizziness. Dizziness is the sensation of light-headedness but without the sensation of movement. Even though the Epley maneuver may relieve your vertigo, it is possible that your symptoms will return within 5 years. °WHAT SHOULD I DO AFTER THIS MANEUVER? °After doing the Epley maneuver, you can return to your normal activities. Ask your doctor if there is anything you should do at home to prevent vertigo. This may include: °· Sleeping with two or more pillows to keep your head elevated. °· Not sleeping on the side of your affected ear. °· Getting up slowly from bed. °· Avoiding sudden movements during the day. °· Avoiding extreme head movement, like looking up or bending over. °· Wearing a cervical collar to prevent sudden head movements. °WHAT SHOULD I DO IF MY SYMPTOMS GET WORSE? °Call your health care provider if your  vertigo gets worse. Call your provider right way if you have other symptoms, including:  °· Nausea. °· Vomiting. °· Headache. °· Weakness. °· Numbness. °· Vision changes. °Document Released: 06/26/2013 Document Reviewed: 06/26/2013 °ExitCare® Patient Information ©2015 [employer]. This information is not intended to replace advice given to you by your health care provider. Make sure you discuss any questions you have with your health care provider. ° °

## 2014-11-07 NOTE — ED Notes (Signed)
Pt states she was laying in bed and couldn't go to sleep, states she went to get up and states started having dizziness and room was spinning, states it is better when eyes are closed, denies pain, states "am I dying, lord have mercy jesus". Pt states years ago had vertigo. Pt anxious about the situation.

## 2014-11-08 DIAGNOSIS — M4800 Spinal stenosis, site unspecified: Secondary | ICD-10-CM | POA: Diagnosis not present

## 2014-11-08 DIAGNOSIS — M549 Dorsalgia, unspecified: Secondary | ICD-10-CM | POA: Diagnosis not present

## 2014-11-08 DIAGNOSIS — R011 Cardiac murmur, unspecified: Secondary | ICD-10-CM | POA: Diagnosis not present

## 2014-11-08 DIAGNOSIS — R42 Dizziness and giddiness: Secondary | ICD-10-CM | POA: Diagnosis not present

## 2014-11-11 DIAGNOSIS — M4806 Spinal stenosis, lumbar region: Secondary | ICD-10-CM | POA: Diagnosis not present

## 2014-11-11 DIAGNOSIS — Z6835 Body mass index (BMI) 35.0-35.9, adult: Secondary | ICD-10-CM | POA: Diagnosis not present

## 2014-11-11 DIAGNOSIS — M4316 Spondylolisthesis, lumbar region: Secondary | ICD-10-CM | POA: Diagnosis not present

## 2014-11-11 DIAGNOSIS — M47816 Spondylosis without myelopathy or radiculopathy, lumbar region: Secondary | ICD-10-CM | POA: Diagnosis not present

## 2014-11-12 DIAGNOSIS — R42 Dizziness and giddiness: Secondary | ICD-10-CM | POA: Diagnosis not present

## 2014-11-12 DIAGNOSIS — E785 Hyperlipidemia, unspecified: Secondary | ICD-10-CM | POA: Diagnosis not present

## 2014-11-12 DIAGNOSIS — I10 Essential (primary) hypertension: Secondary | ICD-10-CM | POA: Diagnosis not present

## 2014-11-13 ENCOUNTER — Encounter: Payer: Self-pay | Admitting: Internal Medicine

## 2014-11-14 ENCOUNTER — Ambulatory Visit: Payer: Commercial Managed Care - HMO | Admitting: Radiation Oncology

## 2014-11-14 ENCOUNTER — Ambulatory Visit
Admission: RE | Admit: 2014-11-14 | Payer: Commercial Managed Care - HMO | Source: Ambulatory Visit | Admitting: Radiation Oncology

## 2014-11-20 ENCOUNTER — Ambulatory Visit
Admission: RE | Admit: 2014-11-20 | Discharge: 2014-11-20 | Disposition: A | Discharge status: Home / Self Care | Payer: Commercial Managed Care - HMO | Source: Ambulatory Visit | Attending: Radiation Oncology | Admitting: Radiation Oncology

## 2014-11-20 DIAGNOSIS — C50411 Malignant neoplasm of upper-outer quadrant of right female breast: Secondary | ICD-10-CM

## 2014-11-20 NOTE — Progress Notes (Signed)
Name: Carolyn Rowland   MRN: [id number]  Date:  11/20/2014  DOB: 06/02/1941  Status:outpatient    DIAGNOSIS: Breast cancer.  CONSENT VERIFIED: yes   SET UP: Patient is setup supine   IMMOBILIZATION:  The following immobilization was used:Custom Moldable Pillow, breast board.   NARRATIVE: [name] was brought to the [place].  Identity was confirmed.  All relevant records and images related to the planned course of therapy were reviewed.  Then, the patient was positioned in a stable reproducible clinical set-up for radiation therapy.  Wires were placed to delineate the clinical extent of breast tissue. A wire was placed on the scar as well.  CT images were obtained.  An isocenter was placed. Skin markings were placed.  The CT images were loaded into the planning software where the target and avoidance structures were contoured.  The radiation prescription was entered and confirmed. The patient was discharged in stable condition and tolerated simulation well.    TREATMENT PLANNING NOTE:  Treatment planning then occurred. I have requested : MLC's, isodose plan, basic dose calculation  I personally designed and supervised the construction of 3 medically necessary complex treatment devices for the protection of critical normal structures including the lungs and contralateral breast as well as the immobilization device which is necessary for set up certainty.   3D simulation occurred. I requested and analyzed a dose volume histogram of the heart, lungs and lumpectomy cavity.

## 2014-11-27 ENCOUNTER — Ambulatory Visit
Admission: RE | Admit: 2014-11-27 | Discharge: 2014-11-27 | Disposition: A | Discharge status: Home / Self Care | Payer: Commercial Managed Care - HMO | Source: Ambulatory Visit | Attending: Radiation Oncology | Admitting: Radiation Oncology

## 2014-11-27 DIAGNOSIS — C50411 Malignant neoplasm of upper-outer quadrant of right female breast: Secondary | ICD-10-CM | POA: Diagnosis not present

## 2014-11-28 ENCOUNTER — Ambulatory Visit
Admission: RE | Admit: 2014-11-28 | Discharge: 2014-11-28 | Disposition: A | Discharge status: Home / Self Care | Payer: Commercial Managed Care - HMO | Source: Ambulatory Visit | Attending: Radiation Oncology | Admitting: Radiation Oncology

## 2014-11-28 DIAGNOSIS — C50411 Malignant neoplasm of upper-outer quadrant of right female breast: Secondary | ICD-10-CM | POA: Diagnosis not present

## 2014-11-29 ENCOUNTER — Ambulatory Visit
Admission: RE | Admit: 2014-11-29 | Discharge: 2014-11-29 | Disposition: A | Discharge status: Home / Self Care | Payer: Commercial Managed Care - HMO | Source: Ambulatory Visit | Attending: Radiation Oncology | Admitting: Radiation Oncology

## 2014-11-29 DIAGNOSIS — C50411 Malignant neoplasm of upper-outer quadrant of right female breast: Secondary | ICD-10-CM | POA: Diagnosis not present

## 2014-12-03 ENCOUNTER — Ambulatory Visit
Admission: RE | Admit: 2014-12-03 | Discharge: 2014-12-03 | Disposition: A | Discharge status: Home / Self Care | Payer: Commercial Managed Care - HMO | Source: Ambulatory Visit | Attending: Radiation Oncology | Admitting: Radiation Oncology

## 2014-12-03 ENCOUNTER — Encounter: Payer: Self-pay | Admitting: *Deleted

## 2014-12-03 VITALS — BP 139/73 | HR 75 | Temp 98.3°F | Wt 218.1 lb

## 2014-12-03 DIAGNOSIS — C50411 Malignant neoplasm of upper-outer quadrant of right female breast: Secondary | ICD-10-CM | POA: Diagnosis not present

## 2014-12-03 DIAGNOSIS — Z510 Encounter for antineoplastic radiation therapy: Secondary | ICD-10-CM | POA: Insufficient documentation

## 2014-12-03 MED ORDER — ALRA NON-METALLIC DEODORANT (RAD-ONC)
1.0000 "application " | Freq: Once | TOPICAL | Status: AC
  Administered 2014-12-03: 1 via TOPICAL

## 2014-12-03 MED ORDER — RADIAPLEXRX EX GEL
Freq: Once | CUTANEOUS | Status: AC
  Administered 2014-12-03: 15:00:00 via TOPICAL

## 2014-12-03 NOTE — Progress Notes (Signed)
Met with pt during wkly check. Relate she is doing well. Denies needs at this time. Encourage pt to call with questions or concerns. Received verbal understanding. 

## 2014-12-03 NOTE — Progress Notes (Addendum)
Weekly Management Note Current Dose: 6 Gy  Projected Dose: 50 Gy   Narrative:  The patient presents for routine under treatment assessment.  CBCT/MVCT images/Port film x-rays were reviewed.  The chart was checked. Doing well. No complaints. RN education performed.   Physical Findings: Weight: 218 lb 1.6 oz (98.93 kg). No new skin changes. Hard right breast.   Impression:  The patient is tolerating radiation.  Plan:  Continue treatment as planned. Start radiaplex

## 2014-12-04 ENCOUNTER — Ambulatory Visit
Admission: RE | Admit: 2014-12-04 | Discharge: 2014-12-04 | Disposition: A | Discharge status: Home / Self Care | Payer: Commercial Managed Care - HMO | Source: Ambulatory Visit | Attending: Radiation Oncology | Admitting: Radiation Oncology

## 2014-12-04 DIAGNOSIS — C50411 Malignant neoplasm of upper-outer quadrant of right female breast: Secondary | ICD-10-CM | POA: Diagnosis not present

## 2014-12-05 ENCOUNTER — Ambulatory Visit
Admission: RE | Admit: 2014-12-05 | Discharge: 2014-12-05 | Disposition: A | Discharge status: Home / Self Care | Payer: Commercial Managed Care - HMO | Source: Ambulatory Visit | Attending: Radiation Oncology | Admitting: Radiation Oncology

## 2014-12-05 DIAGNOSIS — C50411 Malignant neoplasm of upper-outer quadrant of right female breast: Secondary | ICD-10-CM | POA: Diagnosis not present

## 2014-12-06 ENCOUNTER — Ambulatory Visit
Admission: RE | Admit: 2014-12-06 | Discharge: 2014-12-06 | Disposition: A | Discharge status: Home / Self Care | Payer: Commercial Managed Care - HMO | Source: Ambulatory Visit | Attending: Radiation Oncology | Admitting: Radiation Oncology

## 2014-12-06 DIAGNOSIS — C50411 Malignant neoplasm of upper-outer quadrant of right female breast: Secondary | ICD-10-CM | POA: Diagnosis not present

## 2014-12-09 ENCOUNTER — Ambulatory Visit
Admission: RE | Admit: 2014-12-09 | Discharge: 2014-12-09 | Disposition: A | Discharge status: Home / Self Care | Payer: Commercial Managed Care - HMO | Source: Ambulatory Visit | Attending: Radiation Oncology | Admitting: Radiation Oncology

## 2014-12-09 DIAGNOSIS — C50411 Malignant neoplasm of upper-outer quadrant of right female breast: Secondary | ICD-10-CM | POA: Diagnosis not present

## 2014-12-10 ENCOUNTER — Ambulatory Visit
Admission: RE | Admit: 2014-12-10 | Discharge: 2014-12-10 | Disposition: A | Discharge status: Home / Self Care | Payer: Commercial Managed Care - HMO | Source: Ambulatory Visit | Attending: Radiation Oncology | Admitting: Radiation Oncology

## 2014-12-10 ENCOUNTER — Encounter: Payer: Self-pay | Admitting: Radiation Oncology

## 2014-12-10 VITALS — BP 96/84 | HR 79 | Temp 97.8°F | Ht 66.0 in | Wt 217.8 lb

## 2014-12-10 DIAGNOSIS — C50411 Malignant neoplasm of upper-outer quadrant of right female breast: Secondary | ICD-10-CM

## 2014-12-10 NOTE — Progress Notes (Signed)
Carolyn Rowland has received 8 fractions to her right breast.  Skin remains intact and note hyperpigmentation of the breast and right axilla.  She reports numbness in the areola region which has been the norm since surgery. Tenderness of breast.

## 2014-12-10 NOTE — Progress Notes (Signed)
Weekly Management Note Current Dose: 16  Gy  Projected Dose: 61 Gy   Narrative:  The patient presents for routine under treatment assessment.  CBCT/MVCT images/Port film x-rays were reviewed.  The chart was checked. Doing well. No complaints. Numbness around nipple. Questions about deo.   Physical Findings: Weight: 217 lb 12.8 oz (98.793 kg). Unchanged  Impression:  The patient is tolerating radiation.  Plan:  Continue treatment as planned. Discussed anti perspiratand aluminum with increased skin reaction.  Continue radiaplex.

## 2014-12-11 ENCOUNTER — Ambulatory Visit
Admission: RE | Admit: 2014-12-11 | Discharge: 2014-12-11 | Disposition: A | Discharge status: Home / Self Care | Payer: Commercial Managed Care - HMO | Source: Ambulatory Visit | Attending: Radiation Oncology | Admitting: Radiation Oncology

## 2014-12-11 DIAGNOSIS — C50411 Malignant neoplasm of upper-outer quadrant of right female breast: Secondary | ICD-10-CM | POA: Diagnosis not present

## 2014-12-12 ENCOUNTER — Ambulatory Visit
Admission: RE | Admit: 2014-12-12 | Discharge: 2014-12-12 | Disposition: A | Discharge status: Home / Self Care | Payer: Commercial Managed Care - HMO | Source: Ambulatory Visit | Attending: Radiation Oncology | Admitting: Radiation Oncology

## 2014-12-12 DIAGNOSIS — C50411 Malignant neoplasm of upper-outer quadrant of right female breast: Secondary | ICD-10-CM | POA: Diagnosis not present

## 2014-12-13 ENCOUNTER — Ambulatory Visit
Admission: RE | Admit: 2014-12-13 | Discharge: 2014-12-13 | Disposition: A | Discharge status: Home / Self Care | Payer: Commercial Managed Care - HMO | Source: Ambulatory Visit | Attending: Radiation Oncology | Admitting: Radiation Oncology

## 2014-12-13 DIAGNOSIS — C50411 Malignant neoplasm of upper-outer quadrant of right female breast: Secondary | ICD-10-CM | POA: Diagnosis not present

## 2014-12-16 ENCOUNTER — Ambulatory Visit
Admission: RE | Admit: 2014-12-16 | Discharge: 2014-12-16 | Disposition: A | Discharge status: Home / Self Care | Payer: Commercial Managed Care - HMO | Source: Ambulatory Visit | Attending: Radiation Oncology | Admitting: Radiation Oncology

## 2014-12-16 DIAGNOSIS — C50411 Malignant neoplasm of upper-outer quadrant of right female breast: Secondary | ICD-10-CM | POA: Diagnosis not present

## 2014-12-17 ENCOUNTER — Encounter: Payer: Self-pay | Admitting: Radiation Oncology

## 2014-12-17 ENCOUNTER — Ambulatory Visit
Admission: RE | Admit: 2014-12-17 | Discharge: 2014-12-17 | Disposition: A | Discharge status: Home / Self Care | Payer: Commercial Managed Care - HMO | Source: Ambulatory Visit | Attending: Radiation Oncology | Admitting: Radiation Oncology

## 2014-12-17 VITALS — BP 123/75 | HR 70 | Resp 16 | Wt 217.0 lb

## 2014-12-17 DIAGNOSIS — C50411 Malignant neoplasm of upper-outer quadrant of right female breast: Secondary | ICD-10-CM

## 2014-12-17 NOTE — Progress Notes (Addendum)
Weekly Management Note Current Dose:  26 Gy  Projected Dose: 50 Gy   Narrative:  The patient presents for routine under treatment assessment.  CBCT/MVCT images/Port film x-rays were reviewed.  The chart was checked. Doing well. No ksin complaints. Pain in left arm for 1 month.   Physical Findings: Weight: 217 lb (98.431 kg). Unchanged  Impression:  The patient is tolerating radiation.  Plan:  Continue treatment as planned. Discuss left arm pain with PCP.

## 2014-12-17 NOTE — Progress Notes (Signed)
Weight and vitals stable. Denies pain. Faint hyperpigmentation of of right/treated breast noted. Reports using radiaplex bid as directed. Reports a tingling sensation when using alra. Transverse axillary incision well healed. Encouraged patient to apply radiaplex to right axilla. Also, encouraged patient purchase [employer] [place] if Alra continues to irritate her. Patient verbalized understanding. Denies fatigue.  BP 123/75 mmHg  Pulse 70  Resp 16  Wt 217 lb (98.431 kg) Wt Readings from Last 3 Encounters:  12/17/14 217 lb (98.431 kg)  12/10/14 217 lb 12.8 oz (98.793 kg)  12/03/14 218 lb 1.6 oz (98.93 kg)

## 2014-12-18 ENCOUNTER — Ambulatory Visit
Admission: RE | Admit: 2014-12-18 | Discharge: 2014-12-18 | Disposition: A | Discharge status: Home / Self Care | Payer: Commercial Managed Care - HMO | Source: Ambulatory Visit | Attending: Radiation Oncology | Admitting: Radiation Oncology

## 2014-12-18 DIAGNOSIS — C50411 Malignant neoplasm of upper-outer quadrant of right female breast: Secondary | ICD-10-CM | POA: Diagnosis not present

## 2014-12-19 ENCOUNTER — Ambulatory Visit
Admission: RE | Admit: 2014-12-19 | Discharge: 2014-12-19 | Disposition: A | Discharge status: Home / Self Care | Payer: Commercial Managed Care - HMO | Source: Ambulatory Visit | Attending: Radiation Oncology | Admitting: Radiation Oncology

## 2014-12-19 DIAGNOSIS — C50411 Malignant neoplasm of upper-outer quadrant of right female breast: Secondary | ICD-10-CM | POA: Diagnosis not present

## 2014-12-20 ENCOUNTER — Ambulatory Visit
Admission: RE | Admit: 2014-12-20 | Discharge: 2014-12-20 | Disposition: A | Discharge status: Home / Self Care | Payer: Commercial Managed Care - HMO | Source: Ambulatory Visit | Attending: Radiation Oncology | Admitting: Radiation Oncology

## 2014-12-20 DIAGNOSIS — C50411 Malignant neoplasm of upper-outer quadrant of right female breast: Secondary | ICD-10-CM | POA: Diagnosis not present

## 2014-12-23 ENCOUNTER — Ambulatory Visit
Admission: RE | Admit: 2014-12-23 | Discharge: 2014-12-23 | Disposition: A | Discharge status: Home / Self Care | Payer: Commercial Managed Care - HMO | Source: Ambulatory Visit | Attending: Radiation Oncology | Admitting: Radiation Oncology

## 2014-12-23 DIAGNOSIS — C50411 Malignant neoplasm of upper-outer quadrant of right female breast: Secondary | ICD-10-CM | POA: Diagnosis not present

## 2014-12-24 ENCOUNTER — Ambulatory Visit
Admission: RE | Admit: 2014-12-24 | Discharge: 2014-12-24 | Disposition: A | Discharge status: Home / Self Care | Payer: Commercial Managed Care - HMO | Source: Ambulatory Visit | Attending: Radiation Oncology | Admitting: Radiation Oncology

## 2014-12-24 VITALS — BP 132/78 | HR 74 | Temp 97.8°F | Wt 217.4 lb

## 2014-12-24 DIAGNOSIS — C50411 Malignant neoplasm of upper-outer quadrant of right female breast: Secondary | ICD-10-CM

## 2014-12-24 NOTE — Progress Notes (Signed)
Weekly Management Note Current Dose: 36  Gy  Projected Dose: 50 Gy   Narrative:  The patient presents for routine under treatment assessment.  CBCT/MVCT images/Port film x-rays were reviewed.  The chart was checked. Doing well. No complaints.   Physical Findings: Weight: 217 lb 6.4 oz (98.612 kg). Unchanged. Pink, slightly dark breast.   Impression:  The patient is tolerating radiation.  Plan:  Continue treatment as planned.

## 2014-12-24 NOTE — Progress Notes (Signed)
Weekly assessment of radiation to right breast.Completed 18 of 25 treatments.Hyperpigmentation of of skin with mild rash, no itching.Continue radiaplex but will stop alra and change to baking soda or [employer] [place].

## 2014-12-25 ENCOUNTER — Ambulatory Visit
Admission: RE | Admit: 2014-12-25 | Discharge: 2014-12-25 | Disposition: A | Discharge status: Home / Self Care | Payer: Commercial Managed Care - HMO | Source: Ambulatory Visit | Attending: Radiation Oncology | Admitting: Radiation Oncology

## 2014-12-25 DIAGNOSIS — C50411 Malignant neoplasm of upper-outer quadrant of right female breast: Secondary | ICD-10-CM | POA: Diagnosis not present

## 2014-12-26 ENCOUNTER — Ambulatory Visit
Admission: RE | Admit: 2014-12-26 | Discharge: 2014-12-26 | Disposition: A | Discharge status: Home / Self Care | Payer: Commercial Managed Care - HMO | Source: Ambulatory Visit | Attending: Radiation Oncology | Admitting: Radiation Oncology

## 2014-12-26 DIAGNOSIS — C50411 Malignant neoplasm of upper-outer quadrant of right female breast: Secondary | ICD-10-CM | POA: Diagnosis not present

## 2014-12-27 ENCOUNTER — Ambulatory Visit
Admission: RE | Admit: 2014-12-27 | Discharge: 2014-12-27 | Disposition: A | Discharge status: Home / Self Care | Payer: Commercial Managed Care - HMO | Source: Ambulatory Visit | Attending: Radiation Oncology | Admitting: Radiation Oncology

## 2014-12-27 DIAGNOSIS — C50411 Malignant neoplasm of upper-outer quadrant of right female breast: Secondary | ICD-10-CM | POA: Diagnosis not present

## 2014-12-30 ENCOUNTER — Ambulatory Visit
Admission: RE | Admit: 2014-12-30 | Discharge: 2014-12-30 | Disposition: A | Discharge status: Home / Self Care | Payer: Commercial Managed Care - HMO | Source: Ambulatory Visit | Attending: Radiation Oncology | Admitting: Radiation Oncology

## 2014-12-30 DIAGNOSIS — C50411 Malignant neoplasm of upper-outer quadrant of right female breast: Secondary | ICD-10-CM | POA: Diagnosis not present

## 2014-12-30 DIAGNOSIS — I10 Essential (primary) hypertension: Secondary | ICD-10-CM | POA: Diagnosis not present

## 2014-12-30 DIAGNOSIS — E780 Pure hypercholesterolemia: Secondary | ICD-10-CM | POA: Diagnosis not present

## 2014-12-30 DIAGNOSIS — M791 Myalgia: Secondary | ICD-10-CM | POA: Diagnosis not present

## 2014-12-30 DIAGNOSIS — I25119 Atherosclerotic heart disease of native coronary artery with unspecified angina pectoris: Secondary | ICD-10-CM | POA: Diagnosis not present

## 2014-12-31 ENCOUNTER — Ambulatory Visit: Payer: Commercial Managed Care - HMO

## 2014-12-31 ENCOUNTER — Ambulatory Visit
Admission: RE | Admit: 2014-12-31 | Discharge: 2014-12-31 | Disposition: A | Discharge status: Home / Self Care | Payer: Commercial Managed Care - HMO | Source: Ambulatory Visit | Attending: Radiation Oncology | Admitting: Radiation Oncology

## 2014-12-31 VITALS — BP 138/92 | HR 80 | Temp 98.3°F | Wt 217.1 lb

## 2014-12-31 DIAGNOSIS — C50411 Malignant neoplasm of upper-outer quadrant of right female breast: Secondary | ICD-10-CM

## 2014-12-31 MED ORDER — BIAFINE EX EMUL
CUTANEOUS | Status: DC | PRN
  Administered 2014-12-31: 12:00:00 via TOPICAL

## 2014-12-31 NOTE — Progress Notes (Signed)
Weekly assessment of radiation of right [employer].ch 23 of 25 treatments.Mild discomfort of right breast.Change to biafine as there is marked hyperpigmentation of breast and increase in visible follicles.I will see patient on Thursday for skin assessment and discharge teaching and schedule one month follow up.

## 2014-12-31 NOTE — Progress Notes (Signed)
  Radiation Oncology         [phone]   Name: Carolyn Rowland MRN: [id number]   Date: 12/31/2014  DOB: [birth year]   Weekly Radiation Therapy Management    ICD-9-CM ICD-10-CM   1. Breast cancer of upper-outer quadrant of right female breast 174.4 C50.411 topical emolient (BIAFINE) emulsion    Current Dose: 46 Gy  Planned Dose:  50 Gy  Narrative The patient presents for routine under treatment assessment. Mild discomfort of right breast. Change to biafine as there is marked hyperpigmentation of breast and increase in visible follicles. The patient is without complaint. Set-up films were reviewed. The chart was checked.  Physical Findings  weight is 217 lb 1.6 oz (98.476 kg). Her temperature is 98.3 F (36.8 C). Her blood pressure is 138/92 and her pulse is 80. . Weight essentially stable.  No significant changes. Hyperpigmentation and follicular distribution.  Impression The patient is tolerating radiation.  Plan Continue treatment as planned.     This document serves as a record of services personally performed by Tyler Pita, MD. It was created on his behalf by Arlyce Harman, a trained medical scribe. The creation of this record is based on the scribe's personal observations and the provider's statements to them. This document has been checked and approved by the attending provider.       Carolyn Rowland, M.D.

## 2015-01-01 ENCOUNTER — Ambulatory Visit: Payer: Commercial Managed Care - HMO

## 2015-01-01 ENCOUNTER — Ambulatory Visit
Admission: RE | Admit: 2015-01-01 | Discharge: 2015-01-01 | Disposition: A | Discharge status: Home / Self Care | Payer: Commercial Managed Care - HMO | Source: Ambulatory Visit | Attending: Radiation Oncology | Admitting: Radiation Oncology

## 2015-01-01 DIAGNOSIS — C50411 Malignant neoplasm of upper-outer quadrant of right female breast: Secondary | ICD-10-CM | POA: Diagnosis not present

## 2015-01-02 ENCOUNTER — Encounter: Payer: Self-pay | Admitting: Radiation Oncology

## 2015-01-02 ENCOUNTER — Ambulatory Visit
Admission: RE | Admit: 2015-01-02 | Discharge: 2015-01-02 | Disposition: A | Discharge status: Home / Self Care | Payer: Commercial Managed Care - HMO | Source: Ambulatory Visit | Attending: Radiation Oncology | Admitting: Radiation Oncology

## 2015-01-02 DIAGNOSIS — C50411 Malignant neoplasm of upper-outer quadrant of right female breast: Secondary | ICD-10-CM | POA: Diagnosis not present

## 2015-01-03 ENCOUNTER — Telehealth: Payer: Self-pay | Admitting: *Deleted

## 2015-01-03 NOTE — Telephone Encounter (Signed)
Oncology Nurse Navigator Documentation  Oncology Nurse Navigator Flowsheets 01/03/2015  Navigator Encounter Type Telephone  Patient Visit Type Radonc  Treatment Phase Final Radiation Tx  Barriers/Navigation Needs No barriers at this time  Time Spent with Patient 15

## 2015-01-05 NOTE — Progress Notes (Addendum)
°  Radiation Oncology         [phone] ________________________________  Name: Carolyn Rowland MRN: [id number]  Date: 01/02/2015  DOB: [birth year]  End of Treatment Note  Diagnosis: Stage I right breast cancer.    Indication for treatment: Curative    Radiation treatment dates:   11/28/2014-01/02/2015  Site/dose:    Right breast / 50 Gray @ 2 [name]  Beams/energy:  Opposed Tangents / 6 and 10 MV photons  Narrative: The patient tolerated radiation treatment relatively well.   She had moderate hyperpigmentation with no moist desquamation.   Plan: The patient has completed radiation treatment. The patient will return to radiation oncology clinic for routine followup in one month. I advised them to call or return sooner if they have any questions or concerns related to their recovery or treatment.  ------------------------------------------------  Thea Silversmith, MD

## 2015-01-08 ENCOUNTER — Emergency Department (INDEPENDENT_AMBULATORY_CARE_PROVIDER_SITE_OTHER)
Admission: EM | Admit: 2015-01-08 | Discharge: 2015-01-08 | Disposition: A | Discharge status: Home / Self Care | Payer: Commercial Managed Care - HMO | Source: Home / Self Care | Attending: Family Medicine | Admitting: Family Medicine

## 2015-01-08 ENCOUNTER — Encounter (HOSPITAL_COMMUNITY): Payer: Self-pay | Admitting: *Deleted

## 2015-01-08 DIAGNOSIS — M791 Myalgia: Secondary | ICD-10-CM | POA: Diagnosis not present

## 2015-01-08 DIAGNOSIS — W19XXXA Unspecified fall, initial encounter: Secondary | ICD-10-CM | POA: Diagnosis not present

## 2015-01-08 NOTE — ED Notes (Signed)
Pt  Felled  Today      She  Stated  At  [employer]    She  Reports pain r  Knee  And   r  Hip  -  The   Patient  Ambulated  To  The  Exam    Room   With a  Steady fluid  Gait

## 2015-01-08 NOTE — ED Provider Notes (Signed)
CSN: [id number]     Arrival date & time 01/08/15  1457 History   First MD Initiated Contact with Patient 01/08/15 1631     Chief Complaint  Patient presents with  . Fall   (Consider location/radiation/quality/duration/timing/severity/associated sxs/prior Treatment) Patient is a 74 y.o. female presenting with fall. The history is provided by the patient.  Fall This is a new problem. The current episode started 1 to 2 hours ago. The problem has not changed ([place] and tripped on carpet landing on  right side.. now sore.) since onset.Pertinent negatives include no chest pain, no abdominal pain and no shortness of breath.    Past Medical History  Diagnosis Date  . Heart murmur   . Hypertension   . High cholesterol   . Pneumonia 1970's    "once"  . H/O hiatal hernia   . GERD (gastroesophageal reflux disease)   . Arthritis     "hands; legs" (08/17/2013)  . CAP (community acquired pneumonia)   . Aortic stenosis     mild to moderate AS 07/2013  . Vertigo    Past Surgical History  Procedure Laterality Date  . Cardiac catheterization  08/07/2013  . Coronary angioplasty with stent placement  08/17/2013    "6 stents"(08/17/2013)  . Appendectomy  1960  . Abdominal hysterectomy  1985  . Tubal ligation  1980  . Left and right heart catheterization with coronary angiogram N/A 08/07/2013    Procedure: LEFT AND RIGHT HEART CATHETERIZATION WITH CORONARY ANGIOGRAM;  Surgeon: Laverda Page, MD;  Location: [facility] CATH LAB;  Service: Cardiovascular;  Laterality: N/A;  . Percutaneous coronary stent intervention (pci-s) N/A 08/17/2013    Procedure: PERCUTANEOUS CORONARY STENT INTERVENTION (PCI-S);  Surgeon: Laverda Page, MD;  Location: [facility] CATH LAB;  Service: Cardiovascular;  Laterality: N/A;  . Breast lumpectomy with needle localization and axillary sentinel lymph node bx Right 10/17/2014    Procedure: BREAST LUMPECTOMY WITH NEEDLE LOCALIZATION AND AXILLARY SENTINEL LYMPH NODE BX;   Surgeon: Jackolyn Confer, MD;  Location: [facility];  Service: General;  Laterality: Right;   Family History  Problem Relation Age of Onset  . Cancer Brother   . Cancer Brother   . Heart attack Brother    History  Substance Use Topics  . Smoking status: Former Smoker -- 1.00 packs/day for 52 years    Types: Cigarettes    Quit date: 04/04/2014  . Smokeless tobacco: Never Used  . Alcohol Use: No     Comment: 08/17/2013 "quit drinking at age 18; never did drink much"   OB History    No data available     Review of Systems  Constitutional: Negative.   Respiratory: Negative for shortness of breath.   Cardiovascular: Negative for chest pain.  Gastrointestinal: Negative for abdominal pain.  Genitourinary: Negative for pelvic pain.  Musculoskeletal: Positive for myalgias. Negative for joint swelling and gait problem.  Skin: Negative for rash and wound.    Allergies  Penicillins  Home Medications   Prior to Admission medications   Medication Sig Start Date End Date Taking? Authorizing Provider  acetaminophen (TYLENOL) 650 MG CR tablet Take 1,300 mg by mouth every 8 (eight) hours as needed for pain.    Historical Provider, MD  anastrozole (ARIMIDEX) 1 MG tablet Take 1 tablet (1 mg total) by mouth daily. Patient not taking: Reported on 12/10/2014 10/25/14   Nicholas Lose, MD  aspirin 81 MG chewable tablet Chew 1 tablet (81 mg total) by mouth daily. 08/19/13  Adrian Prows, MD  atorvastatin (LIPITOR) 80 MG tablet Take 1 tablet (80 mg total) by mouth daily. 08/07/13   Adrian Prows, MD  CRESTOR 20 MG tablet  12/30/14   Historical Provider, MD  cyclobenzaprine (FLEXERIL) 5 MG tablet  09/23/14   Historical Provider, MD  emollient (BIAFINE) cream Apply topically 2 (two) times daily.    Historical Provider, MD  gabapentin (NEURONTIN) 300 MG capsule Take 1 capsule (300 mg total) by mouth at bedtime. 09/28/14   Melony Overly, MD  isosorbide mononitrate (IMDUR) 60 MG 24 hr tablet Take 1 tablet (60 mg total) by  mouth daily. 08/07/13   Adrian Prows, MD  meclizine (ANTIVERT) 25 MG tablet Take 1 tablet (25 mg total) by mouth 3 (three) times daily as needed for dizziness. 11/07/14   Linton Flemings, MD  metoprolol succinate (TOPROL-XL) 50 MG 24 hr tablet Take 50 mg by mouth daily. Take with or immediately following a meal.    Historical Provider, MD  Multiple Vitamins-Minerals (MULTIVITAMIN WITH MINERALS) tablet Take 1 tablet by mouth daily.    Historical Provider, MD  nitroGLYCERIN (NITROSTAT) 0.4 MG SL tablet Place 1 tablet (0.4 mg total) under the tongue every 5 (five) minutes as needed for chest pain. 08/07/13   Adrian Prows, MD  omeprazole (PRILOSEC) 40 MG capsule Take 40 mg by mouth daily.    Historical Provider, MD  spironolactone (ALDACTONE) 25 MG tablet Take 25 mg by mouth daily.    Historical Provider, MD  traMADol (ULTRAM) 50 MG tablet Take 2 tablets (100 mg total) by mouth every 8 (eight) hours as needed. Patient taking differently: Take 100 mg by mouth every 8 (eight) hours as needed for moderate pain.  06/03/14   Harden Mo, MD   BP 146/67 mmHg  Pulse 76  Temp(Src) 97.5 F (36.4 C) (Oral)  Resp 16  SpO2 100% Physical Exam  Constitutional: She is oriented to person, place, and time. She appears well-developed and well-nourished. No distress.  HENT:  Head: Normocephalic and atraumatic.  Abdominal: Soft. Bowel sounds are normal. There is no tenderness.  Musculoskeletal: Normal range of motion. She exhibits no tenderness.  Ambulatory without limitation.  Neurological: She is alert and oriented to person, place, and time.  Skin: Skin is warm and dry. No rash noted. No erythema.  Nursing note and vitals reviewed.   ED Course  Procedures (including critical care time) Labs Review Labs Reviewed - No data to display  Imaging Review No results found.   MDM   1. Fall, initial encounter        Billy Fischer, MD 01/08/15 [phone]

## 2015-01-08 NOTE — Discharge Instructions (Signed)
Ice and tylenol as needed. See your doctor if further problems.

## 2015-01-10 ENCOUNTER — Encounter: Payer: Self-pay | Admitting: Radiation Oncology

## 2015-01-13 ENCOUNTER — Telehealth: Payer: Self-pay | Admitting: Adult Health

## 2015-01-13 NOTE — Telephone Encounter (Signed)
I spoke with Carolyn Rowland regarding her eligibility to see me in [facility] now that she has completed treatment.  I have scheduled her appt to coordinate with her routine 59-month f/u visit with Dr. Pablo Ledger on 02/13/15.  She will see Dr. Pablo Ledger at 8:20am and then see me at 8:45am.    Also, she mentioned that she is still using the "radiation cream" that she was using during treatment and her skin was still peeling.  I encouraged her to switch from that cream and begin using a lotion with vitamin E now that she has completed treatment.  She verbalized understanding.  I look forward to seeing her on 02/13/15 and encouraged her to call me with any other questions or concerns before her visit here.   Mike Craze, NP [place] [phone]

## 2015-02-11 NOTE — Assessment & Plan Note (Signed)
Right breast lumpectomy 10/17/2014: 0.28 cm focus of residual IDC grade 2 with papillary features, 0/1 sentinel node, background of sclerosing lymphocytic lobulitis, fibrocystic changes, E 99%, PR 97%, HER-2 negative, Ki-67 7%, T1a N0 M0 stage IA S/P XRT 01/02/15, Started anastrozole July 2016  Anastrozole Toxicities:  RTC in 6 months

## 2015-02-12 ENCOUNTER — Telehealth: Payer: Self-pay

## 2015-02-12 ENCOUNTER — Ambulatory Visit (HOSPITAL_BASED_OUTPATIENT_CLINIC_OR_DEPARTMENT_OTHER): Payer: Commercial Managed Care - HMO | Admitting: Hematology and Oncology

## 2015-02-12 ENCOUNTER — Telehealth: Payer: Self-pay | Admitting: Hematology and Oncology

## 2015-02-12 VITALS — BP 136/73 | HR 74 | Temp 98.4°F | Resp 18 | Ht 66.0 in | Wt 212.9 lb

## 2015-02-12 DIAGNOSIS — Z170 Estrogen receptor positive status [ER+]: Secondary | ICD-10-CM

## 2015-02-12 DIAGNOSIS — C50411 Malignant neoplasm of upper-outer quadrant of right female breast: Secondary | ICD-10-CM | POA: Diagnosis not present

## 2015-02-12 NOTE — Telephone Encounter (Signed)
Referral Response Letter dtd 02/11/15 rcvd from [facility].  Reviewed by Dr. Lindi Adie.  Sent to scan.

## 2015-02-12 NOTE — Progress Notes (Signed)
Patient Care Team: Fanny Bien, MD as PCP - General (Family Medicine)  DIAGNOSIS: Breast cancer of upper-outer quadrant of right female breast   Staging form: Breast, AJCC 7th Edition     Pathologic stage from 10/17/2014: Stage IA (T1a, N0, cM0) - Unsigned     Clinical: Stage IA (T1c, N0, M0) - Unsigned   SUMMARY OF ONCOLOGIC HISTORY:   Breast cancer of upper-outer quadrant of right female breast   07/23/2014 Mammogram Right breast (12 o'clock): New cluster of heterogenous calcs in right breast suspicious for malignancy. No other significant findings seen in right breast.   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 Grade 2. ER+ (99%), PR+ (97%), HER2- by FISH. Ki67 7%.    08/20/2014 Breast MRI Right breast: 1.8 cm enhancement at 11:00. Left breast: 7 x 9 mm circumscribed oval mass at 9:00 stable from 2010-benign. No abnormal appearing lymph nodes.   10/17/2014 Surgery Right breast lumpectomy with SLNB ([facility]): Grade 2, residual IDC with papillary features 0.28 cm. 0/1 right SLN. 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 ER+/PR+ HER-2 negative, Ki67 7%.   10/17/2014 Pathologic Stage  pT1a, pN0: Stage IA   11/28/2014 - 01/02/2015 Radiation Therapy Adjuvant RT completed Pablo Ledger): Right breast / 50 Gray @ 2 [name]   01/2015 -  Anti-estrogen oral therapy Anastrazole 1mg  daily. Planned duration of treatment: 5 years.     CHIEF COMPLIANT: Follow-up on anastrozole  INTERVAL HISTORY: Carolyn Rowland is a 74 year old with above-mentioned history of right breast cancer currently on antiestrogen therapy with anastrozole. She is tolerating it extremely well without any major problems concerns. She's been taking it for the past 3-4 days. Denies any lumps or nodules in the breasts.  REVIEW OF SYSTEMS:   Constitutional: Denies fevers, chills or abnormal weight  loss Eyes: Denies blurriness of vision Ears, nose, mouth, throat, and face: Denies mucositis or sore throat Respiratory: Denies cough, dyspnea or wheezes Cardiovascular: Denies palpitation, chest discomfort or lower extremity swelling Gastrointestinal:  Denies nausea, heartburn or change in bowel habits Skin: Denies abnormal skin rashes Lymphatics: Denies new lymphadenopathy or easy bruising Neurological:Denies numbness, tingling or new weaknesses Behavioral/Psych: Mood is stable, no new changes  Breast:  denies any pain or lumps or nodules in either breasts All other systems were reviewed with the patient and are negative.  I have reviewed the past medical history, past surgical history, social history and family history with the patient and they are unchanged from previous note.  ALLERGIES:  is allergic to penicillins.  MEDICATIONS:  Current Outpatient Prescriptions  Medication Sig Dispense Refill  . acetaminophen (TYLENOL) 650 MG CR tablet Take 1,300 mg by mouth every 8 (eight) hours as needed for pain.    Marland Kitchen anastrozole (ARIMIDEX) 1 MG tablet Take 1 tablet (1 mg total) by mouth daily. 90 tablet 3  . aspirin 81 MG chewable tablet Chew 1 tablet (81 mg total) by mouth daily.    Marland Kitchen atorvastatin (LIPITOR) 80 MG tablet Take 1 tablet (80 mg total) by mouth daily. 30 tablet 1  . CRESTOR 20 MG tablet     . cyclobenzaprine (FLEXERIL) 5 MG tablet   1  . emollient (BIAFINE) cream Apply topically 2 (two) times daily.    Marland Kitchen gabapentin (NEURONTIN) 300 MG capsule Take 1 capsule (300 mg total) by mouth at bedtime. 30 capsule 0  . isosorbide mononitrate (IMDUR) 60 MG 24 hr tablet Take  1 tablet (60 mg total) by mouth daily. 30 tablet 1  . meclizine (ANTIVERT) 25 MG tablet Take 1 tablet (25 mg total) by mouth 3 (three) times daily as needed for dizziness. 30 tablet 0  . metoprolol succinate (TOPROL-XL) 50 MG 24 hr tablet Take 50 mg by mouth daily. Take with or immediately following a meal.    . Multiple  Vitamins-Minerals (MULTIVITAMIN WITH MINERALS) tablet Take 1 tablet by mouth daily.    . nitroGLYCERIN (NITROSTAT) 0.4 MG SL tablet Place 1 tablet (0.4 mg total) under the tongue every 5 (five) minutes as needed for chest pain. 30 tablet 1  . omeprazole (PRILOSEC) 40 MG capsule Take 40 mg by mouth daily.    Marland Kitchen spironolactone (ALDACTONE) 25 MG tablet Take 25 mg by mouth daily.    . traMADol (ULTRAM) 50 MG tablet Take 2 tablets (100 mg total) by mouth every 8 (eight) hours as needed. (Patient taking differently: Take 100 mg by mouth every 8 (eight) hours as needed for moderate pain. ) 30 tablet 0   No current facility-administered medications for this visit.    PHYSICAL EXAMINATION: ECOG PERFORMANCE STATUS: 0 - Asymptomatic  Filed Vitals:   02/12/15 1028  BP: 136/73  Pulse: 74  Temp: 98.4 F (36.9 C)  Resp: 18   Filed Weights   02/12/15 1028  Weight: 212 lb 14.4 oz (96.571 kg)    GENERAL:alert, no distress and comfortable SKIN: skin color, texture, turgor are normal, no rashes or significant lesions EYES: normal, Conjunctiva are pink and non-injected, sclera clear OROPHARYNX:no exudate, no erythema and lips, buccal mucosa, and tongue normal  NECK: supple, thyroid normal size, non-tender, without nodularity LYMPH:  no palpable lymphadenopathy in the cervical, axillary or inguinal LUNGS: clear to auscultation and percussion with normal breathing effort HEART: regular rate & rhythm and no murmurs and no lower extremity edema ABDOMEN:abdomen soft, non-tender and normal bowel sounds Musculoskeletal:no cyanosis of digits and no clubbing  NEURO: alert & oriented x 3 with fluent speech, no focal motor/sensory deficits  LABORATORY DATA:  I have reviewed the data as listed   Chemistry      Component Value Date/Time   NA 143 11/07/2014 0101   K 3.7 11/07/2014 0101   CL 108 11/07/2014 0101   CO2 28 11/07/2014 0101   BUN 16 11/07/2014 0101   CREATININE 0.88 11/07/2014 0101       Component Value Date/Time   CALCIUM 9.2 11/07/2014 0101   ALKPHOS 65 11/07/2014 0101   AST 23 11/07/2014 0101   ALT 14 11/07/2014 0101   BILITOT 0.8 11/07/2014 0101       Lab Results  Component Value Date   WBC 6.9 11/07/2014   HGB 13.0 11/07/2014   HCT 37.2 11/07/2014   MCV 78.6 11/07/2014   PLT 216 11/07/2014   NEUTROABS 3.6 11/07/2014   ASSESSMENT & PLAN:  Breast cancer of upper-outer quadrant of right female breast Right breast lumpectomy 10/17/2014: 0.28 cm focus of residual IDC grade 2 with papillary features, 0/1 sentinel node, background of sclerosing lymphocytic lobulitis, fibrocystic changes, E 99%, PR 97%, HER-2 negative, Ki-67 7%, T1a N0 M0 stage IA S/P XRT 01/02/15, Started anastrozole August 2016  Anastrozole Toxicities: No major side effects anastrozole. His been on it for only for the past 3 days.  RTC in 3 months   No orders of the defined types were placed in this encounter.   The patient has a good understanding of the overall plan. she agrees with  it. she will call with any problems that may develop before the next visit here.   Rulon Eisenmenger, MD

## 2015-02-12 NOTE — Telephone Encounter (Signed)
Gave avs & calendar for November.  °

## 2015-02-13 ENCOUNTER — Encounter: Payer: Self-pay | Admitting: *Deleted

## 2015-02-13 ENCOUNTER — Encounter: Payer: Self-pay | Admitting: Adult Health

## 2015-02-13 ENCOUNTER — Ambulatory Visit (HOSPITAL_BASED_OUTPATIENT_CLINIC_OR_DEPARTMENT_OTHER): Payer: Commercial Managed Care - HMO | Admitting: Adult Health

## 2015-02-13 ENCOUNTER — Ambulatory Visit
Admission: RE | Admit: 2015-02-13 | Discharge: 2015-02-13 | Disposition: A | Discharge status: Home / Self Care | Payer: Commercial Managed Care - HMO | Source: Ambulatory Visit | Attending: Radiation Oncology | Admitting: Radiation Oncology

## 2015-02-13 ENCOUNTER — Encounter: Payer: Self-pay | Admitting: Radiation Oncology

## 2015-02-13 VITALS — BP 135/68 | HR 75 | Temp 98.8°F | Resp 20 | Ht 66.0 in | Wt 215.7 lb

## 2015-02-13 VITALS — BP 135/86 | HR 75 | Temp 98.8°F | Resp 20

## 2015-02-13 DIAGNOSIS — M79605 Pain in left leg: Secondary | ICD-10-CM

## 2015-02-13 DIAGNOSIS — C50411 Malignant neoplasm of upper-outer quadrant of right female breast: Secondary | ICD-10-CM

## 2015-02-13 DIAGNOSIS — K5900 Constipation, unspecified: Secondary | ICD-10-CM | POA: Diagnosis not present

## 2015-02-13 DIAGNOSIS — Z79811 Long term (current) use of aromatase inhibitors: Secondary | ICD-10-CM

## 2015-02-13 DIAGNOSIS — M79604 Pain in right leg: Secondary | ICD-10-CM | POA: Diagnosis not present

## 2015-02-13 DIAGNOSIS — Z170 Estrogen receptor positive status [ER+]: Secondary | ICD-10-CM

## 2015-02-13 DIAGNOSIS — Z923 Personal history of irradiation: Secondary | ICD-10-CM

## 2015-02-13 NOTE — Progress Notes (Signed)
Follow up right breast rad txs 11/28/14-01/02/15, right breast well healed, bus discoloration and dryness  Using lotion with vitamin e  Gave [facility] flyer to patient she is interested in going to this program,  Started Arimidex 1mg  po daily this past Sunday main c/o legs started hurting  8:36 AM BP 135/68 mmHg  Pulse 75  Temp(Src) 98.8 F (37.1 C) (Oral)  Resp 20  Ht 5\' 6"  (1.676 m)  Wt 215 lb 11.2 oz (97.841 kg)  BMI 34.83 kg/m2  Wt Readings from Last 3 Encounters:  02/13/15 215 lb 11.2 oz (97.841 kg)  02/12/15 212 lb 14.4 oz (96.571 kg)  12/31/14 217 lb 1.6 oz (98.476 kg)

## 2015-02-13 NOTE — Progress Notes (Signed)
CLINIC:  Cancer Survivorship   REASON FOR VISIT:  Routine follow-up post-treatment for a recent history of breast cancer.  BRIEF ONCOLOGIC HISTORY:    Breast cancer of upper-outer quadrant of right female breast   07/23/2014 Mammogram Right breast (12 o'clock): New cluster of heterogenous calcs in right breast suspicious for malignancy. No other significant findings seen in right breast.   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 Grade 2. ER+ (99%), PR+ (97%), HER2- by FISH. Ki67 7%.    08/20/2014 Breast MRI Right breast: 1.8 cm enhancement at 11:00. Left breast: 7 x 9 mm circumscribed oval mass at 9:00 stable from 2010-benign. No abnormal appearing lymph nodes.   10/17/2014 Surgery Right breast lumpectomy with SLNB ([facility]bower): Grade 2, residual IDC with papillary features 0.28 cm. 0/1 right SLN. 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 ER+/PR+ HER-2 negative, Ki67 7%.   10/17/2014 Pathologic Stage  pT1a, pN0: Stage IA   11/28/2014 - 01/02/2015 Radiation Therapy Adjuvant RT completed Pablo Ledger): Right breast / 50 Gray @ 2 [name]   01/2015 -  Anti-estrogen oral therapy Anastrazole 60m daily. Planned duration of treatment: 5 years.    02/13/2015 Survivorship Survivorship Care Plan given/reviewed with pt.     INTERVAL HISTORY:  [name] initial meeting to review her survivorship care plan detailing her treatment course for breast cancer, as well as monitoring long-term side effects of that treatment, education regarding health maintenance, screening, and overall wellness and health promotion.     Overall, [name] feeling quite well since completing her radiation therapy approximately one month ago.  She began taking the anastrazole on 02/09/15.  She reports having some bilateral leg "shooting,  jumping" pain down bilateral thighs that extends to each knee.  [name] having a history of spinal stenosis, for which she sees a local specialist and is supposed to have injections soon, per the patient's report.  She denies any muscle or joint aches, peripheral neuropathy, or recent falls.  She reports recent "taste changes" and some dental issues with a reported "crumbling tooth" and new cavity.  She chronically has constipation and reported seeing some bright red blood on her toilet paper about 3 weeks ago.  No rectal bleeding since then.  She periodically reports taking laxatives to help with constipation.  She tells me that she does not feel like she has very much physical strength since completing her cancer treatments and does not do much physical exercise. She does not have great support from the people who live with her, both physically or emotionally.  She relies on her siblings, friends, and church for support.    REVIEW OF SYSTEMS:  General: Denies fever, chills, unintentional weight loss. Endorses some fatigue and generalized muscle weakness/deconditioning since completing cancer treatments.  Cardiac: Denies palpitations, chest pain, and lower extremity edema.  Respiratory: Denies cough, shortness of breath, and dyspnea on exertion.  GI: Denies abdominal pain, diarrhea, nausea, or vomiting. Endorses constipation per HPI.  GU: Denies dysuria, hematuria, vaginal bleeding, vaginal discharge.  Musculoskeletal: Denies joint or bone pain. Endorses bilat LE nerve pain.  Neuro: Denies headache or recent falls. Denies peripheral neuropathy. Skin: Denies rash, pruritis, or open wounds.  Breast: Denies any new nodularity, masses, tenderness, nipple changes, or nipple discharge.  Psych: Denies depression, anxiety, insomnia, or memory loss.   A 14-point review of systems was  completed and was negative, except as noted above.   ONCOLOGY TREATMENT TEAM:  1. Surgeon:  Dr. Zella Richer at  [facility] 2. Medical Oncologist: Dr. Lindi Adie 3. Radiation Oncologist: Dr. Pablo Ledger    PAST MEDICAL/SURGICAL HISTORY:  Past Medical History  Diagnosis Date  . Heart murmur   . Hypertension   . High cholesterol   . Pneumonia 1970's    "once"  . H/O hiatal hernia   . GERD (gastroesophageal reflux disease)   . Arthritis     "hands; legs" (08/17/2013)  . CAP (community acquired pneumonia)   . Aortic stenosis     mild to moderate AS 07/2013  . Vertigo   . Radiation 11/28/14-01/02/15    Right breast 50 Pearline Cables   Past Surgical History  Procedure Laterality Date  . Cardiac catheterization  08/07/2013  . Coronary angioplasty with stent placement  08/17/2013    "6 stents"(08/17/2013)  . Appendectomy  1960  . Abdominal hysterectomy  1985  . Tubal ligation  1980  . Left and right heart catheterization with coronary angiogram N/A 08/07/2013    Procedure: LEFT AND RIGHT HEART CATHETERIZATION WITH CORONARY ANGIOGRAM;  Surgeon: Laverda Page, MD;  Location: [facility] CATH LAB;  Service: Cardiovascular;  Laterality: N/A;  . Percutaneous coronary stent intervention (pci-s) N/A 08/17/2013    Procedure: PERCUTANEOUS CORONARY STENT INTERVENTION (PCI-S);  Surgeon: Laverda Page, MD;  Location: [facility] CATH LAB;  Service: Cardiovascular;  Laterality: N/A;  . Breast lumpectomy with needle localization and axillary sentinel lymph node bx Right 10/17/2014    Procedure: BREAST LUMPECTOMY WITH NEEDLE LOCALIZATION AND AXILLARY SENTINEL LYMPH NODE BX;  Surgeon: Jackolyn Confer, MD;  Location: [place];  Service: General;  Laterality: Right;     ALLERGIES:  Allergies  Allergen Reactions  . Penicillins Rash     CURRENT MEDICATIONS:  Current Outpatient Prescriptions on File Prior to Visit  Medication Sig Dispense Refill  . acetaminophen (TYLENOL) 650 MG CR tablet Take 1,300 mg by mouth every 8 (eight) hours as needed for pain.    Marland Kitchen anastrozole (ARIMIDEX) 1 MG tablet Take 1 tablet (1 mg total) by mouth  daily. 90 tablet 3  . aspirin 81 MG chewable tablet Chew 1 tablet (81 mg total) by mouth daily.    . CRESTOR 20 MG tablet Take 20 mg by mouth daily.     . cyclobenzaprine (FLEXERIL) 5 MG tablet   1  . emollient (BIAFINE) cream Apply topically 2 (two) times daily.    Marland Kitchen gabapentin (NEURONTIN) 300 MG capsule Take 1 capsule (300 mg total) by mouth at bedtime. (Patient not taking: Reported on 02/13/2015) 30 capsule 0  . isosorbide mononitrate (IMDUR) 60 MG 24 hr tablet Take 1 tablet (60 mg total) by mouth daily. 30 tablet 1  . meclizine (ANTIVERT) 25 MG tablet Take 1 tablet (25 mg total) by mouth 3 (three) times daily as needed for dizziness. 30 tablet 0  . metoprolol succinate (TOPROL-XL) 50 MG 24 hr tablet Take 50 mg by mouth daily. Take with or immediately following a meal.    . Multiple Vitamins-Minerals (MULTIVITAMIN WITH MINERALS) tablet Take 1 tablet by mouth daily.    . nitroGLYCERIN (NITROSTAT) 0.4 MG SL tablet Place 1 tablet (0.4 mg total) under the tongue every 5 (five) minutes as needed for chest pain. (Patient not taking: Reported on 02/13/2015) 30 tablet 1  . omeprazole (PRILOSEC) 40 MG capsule Take 40 mg by mouth daily.    Marland Kitchen spironolactone (ALDACTONE) 25 MG tablet Take  25 mg by mouth daily.    . traMADol (ULTRAM) 50 MG tablet Take 2 tablets (100 mg total) by mouth every 8 (eight) hours as needed. (Patient taking differently: Take 100 mg by mouth every 8 (eight) hours as needed for moderate pain. ) 30 tablet 0   No current facility-administered medications on file prior to visit.     ONCOLOGIC FAMILY HISTORY:  Family History  Problem Relation Age of Onset  . Cancer Brother   . Cancer Brother   . Heart attack Brother      GENETIC COUNSELING/TESTING: None  SOCIAL HISTORY:  Carolyn Rowland is married and lives with her husband in [place], [place].  She has 3 adult children; 2 sons and 1 daughter. Her adult daughter lives in her home with her.   Ms. Jaskolski is currently out  of work since her diagnosis and treatment for breast cancer.  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  She denies any current tobacco, alcohol, or illicit drug use.     PHYSICAL EXAMINATION:  Vital Signs: Filed Vitals:   02/13/15 0832  BP: 135/86  Pulse: 75  Temp: 98.8 F (37.1 C)  Resp: 20   General: Well-nourished, well-appearing female in no acute distress.  She is unaccompanied today.   HEENT: Head is atraumatic and normocephalic.  Pupils equal and reactive to light and accomodation. Conjunctivae clear without exudate.  Sclerae anicteric. Oral mucosa is pink, moist, and intact without lesions.  Oropharynx is pink without lesions or erythema.  Lymph: No cervical, supraclavicular, infraclavicular, or axillary lymphadenopathy noted on palpation.  Cardiovascular: Regular rate and rhythm without murmurs, rubs, or gallops. Respiratory: Clear to auscultation bilaterally. Chest expansion symmetric without accessory muscle use on inspiration or expiration.  GI: Abdomen soft and round. No tenderness to palpation. Bowel sounds normoactive in 4 quadrants. No hepatosplenomegaly.   GU: Deferred.  Musculoskeletal: Muscle strength 4/5 in all extremities.  Full ROM noted in all extremities.  Neuro: No focal deficits. Steady gait.  Psych: Mood and affect normal and appropriate for situation.  Extremities: No edema, cyanosis, or clubbing.  Skin: Warm and dry. No open lesions noted.   LABORATORY DATA:  None for this visit.  DIAGNOSTIC IMAGING:  None for this visit.     ASSESSMENT AND PLAN:   1. History of breast cancer:  Ms. Lisbon will follow-up with her medical oncologist, Dr. Lindi Adie in 05/2015 with history and physical exam per surveillance protocol.  She will continue her anti-estrogen therapy with anastrazole as prescribed by Dr. Lindi Adie. She was instructed to make Dr. Lindi Adie or myself aware if she begins to experience any side effects of the medication and I could see her back in  clinic to help manage those side effects, as needed. Common side effects of anastrazole were again reviewed with her as well. A comprehensive survivorship care plan and treatment summary was reviewed with the patient today detailing her breast cancer diagnosis, treatment course, potential late/long-term effects of treatment, appropriate follow-up care with recommendations for the future, and patient education resources.  A copy of this summary, along with a letter will be sent to the patient's primary care provider via mail/fax/In Basket message after today's visit.  Ms. Nathanson is welcome to return to the Survivorship Clinic in the future, as needed; no follow-up will be scheduled at this time.    2. Bilateral leg pain: Her bilateral leg pain is likely the result of her history of spinal stenosis. It is  unlikely that this is related to her anti-estrogen therapy, given that she has only recently started the anastrazole.  Also the nature of her pain is more neuropathic, making the spinal stenosis with radiculopathy the most likely etiology of that pain.  She has a scheduled follow-up visit with her spine specialist soon and is supposed to have injections at that time, per the patient's report.  I will defer to her specialist and her PCP for management of this issue, since it is likely unrelated to her history of breast cancer or the cancer treatments.   3. Constipation (chronic): Again, Ms. Thornell reports a long history (several years) of constipation with occasional use of laxatives.  I let her know that the likely etiology of her episode of rectal bleeding was either a fissure or a hemorrhoid.   I encouraged her to take both a stool softener and a laxative (something like OTC Sennokot-S) to provide some relief.  I encouraged her to increase her activity with walking as well, as this can improve constipation.  I encouraged her to follow-up with her PCP for this issue if it doesn't get better.   4. Lack of social  support/Finding the "new normal":  We discussed different opportunities for Ms. Hiltner to receive additional social interaction and support.  I encouraged her to participate in the [facility] program and she is excited about taking advantage of that service to "get me out of the house", as well as provide her with improved physical strength and stamina.  I have placed a referral to our [employer] coordinator who will be in touch with Ms. Longhi regarding enrollment in the program.  We also discussed [employer] and the many opportunities for community engagement and healing through the arts.  She is interested in learning more about their programs.  I gave Ms. Schiltz a brochure with contact information for the [employer].  Lastly, we discussed our "Finding Your New Normal" [facility]) survivorship support group series, designed for cancer survivors to engage in group therapy and education to better understand their new lives after cancer and cancer treatment.  Ms. Roseman is going to consider attending this series and wants to think about it more before enrolling.  She has a brochure with contact information to get signed up if she chooses.  I also gave her a copy of the Cancer Center's calendar of support groups and events for our patients, all offered free of charge to our patients and their loved ones.  My hope is that Ms. Tarr will participate in some of our many activities and find a group of fellow survivors with which to share her experiences and offer one another support.   5. Bone health: Given Ms. Scheff age, history of breast cancer, and her current treatment regimen including anti-estrogen therapy with anastrazole, she is at risk for bone demineralization.  She will be due for a bone density scan at the discretion of her medical oncologist or PCP.  In the meantime, she was encouraged to increase her consumption of foods rich in calcium, as well as increase her weight-bearing activities.   She was given education on specific activities to promote bone health.  6. Cancer screening:  Due to Ms. Sheu's history and her age, she should receive screening for skin cancers and colon cancer.  The information and recommendations are listed on the patient's comprehensive care plan/treatment summary and were reviewed in detail with the patient.    7. Health maintenance and wellness promotion:  Ms. Mcraney was encouraged to consume 5-7 servings of fruits and vegetables per day. We reviewed the "Nutrition Rainbow" handout, as well as the handout about "Nutrition for Breast Cancer Survivors."  She was also encouraged to engage in moderate to vigorous exercise for 30 minutes per day most days of the week.  She was instructed to limit her alcohol consumption and continue to abstain from tobacco use.     8. Support services/counseling: It is not uncommon for this period of the patient's cancer care trajectory to be one of many emotions and stressors.    Ms. Sommerville was encouraged to take advantage of our many  support services programs, support groups, and/or counseling in coping with her new life as a cancer survivor after completing anti-cancer treatment.  She was offered support today through active listening and expressive supportive counseling.  She was given information regarding our available services and encouraged to contact me with any questions or for help enrolling in any of our support group/programs.   A total of 50 minutes of face-to-face time was spent with this patient with greater than 50% of that time in counseling and care-coordination.  Of note: I provided Ms. Arreaga with a letter allowing her to return to work with no physical restrictions, as she has completed treatment and it is my medical judgment that she is safe to return to work.  Mike Craze, NP Survivorship Program [facility] [phone]   Note: PRIMARY CARE PROVIDER [facility],  [place] [phone]

## 2015-02-13 NOTE — Progress Notes (Signed)
   Department of Radiation Oncology  Phone:  [phone] Fax:        [phone]   Name: DAVEIGH BATTY MRN: [id number]  DOB: 07-06-1940  Date: 02/13/2015  Follow Up Visit Note  Diagnosis: Breast cancer of upper-outer quadrant of right female breast   Staging form: Breast, AJCC 7th Edition     Pathologic stage from 10/17/2014: Stage IA (T1a, N0, cM0) - Unsigned     Clinical: Stage IA (T1c, N0, M0) - Unsigned   Summary and Interval since last radiation: Right breast / 50 Gray @ 2 [name] completed 01/02/15  Interval History: Carolyn Rowland presents today for routine followup.  She is doing well. Her skin has healed up well. She is using lotion with vitamin e.   Physical Exam:  Filed Vitals:   02/13/15 0831  BP: 135/68  Pulse: 75  Temp: 98.8 F (37.1 C)  TempSrc: Oral  Resp: 20  Height: $Remove'5\' 6"'eGlgdPG$  (1.676 m)  Weight: 215 lb 11.2 oz (97.841 kg)   Darkness around the breast and inframammary fold. No moist desquamation. Alert and oriented.   IMPRESSION: Carolyn Rowland is a 74 y.o. female s/p breast conservation with resolving acute effects of treatment.   PLAN:  She is doing well. We discussed the need for follow up every 4-6 months which she has scheduled.  We discussed the need for yearly mammograms which she can schedule with her OBGYN or with medical oncology. We discussed the need for sun protection in the treated area.  She can always call me with questions.  I will follow up with her on an as needed basis.   She met with our survivorship nurse practioner after this visit.    Thea Silversmith, MD

## 2015-04-09 DIAGNOSIS — J309 Allergic rhinitis, unspecified: Secondary | ICD-10-CM | POA: Diagnosis not present

## 2015-04-15 DIAGNOSIS — R42 Dizziness and giddiness: Secondary | ICD-10-CM | POA: Diagnosis not present

## 2015-04-15 DIAGNOSIS — E782 Mixed hyperlipidemia: Secondary | ICD-10-CM | POA: Diagnosis not present

## 2015-04-15 DIAGNOSIS — I10 Essential (primary) hypertension: Secondary | ICD-10-CM | POA: Diagnosis not present

## 2015-04-16 DIAGNOSIS — J309 Allergic rhinitis, unspecified: Secondary | ICD-10-CM | POA: Diagnosis not present

## 2015-04-16 DIAGNOSIS — E782 Mixed hyperlipidemia: Secondary | ICD-10-CM | POA: Diagnosis not present

## 2015-04-16 DIAGNOSIS — K219 Gastro-esophageal reflux disease without esophagitis: Secondary | ICD-10-CM | POA: Diagnosis not present

## 2015-04-16 DIAGNOSIS — I10 Essential (primary) hypertension: Secondary | ICD-10-CM | POA: Diagnosis not present

## 2015-05-05 ENCOUNTER — Encounter: Payer: Self-pay | Admitting: Adult Health

## 2015-05-05 NOTE — Progress Notes (Signed)
A birthday card was mailed to the patient today on behalf of the Survivorship Program at [facility].   Gretchen Dawson, NP Survivorship Program  Cancer Center [phone]  

## 2015-05-12 ENCOUNTER — Telehealth: Payer: Self-pay | Admitting: Hematology and Oncology

## 2015-05-12 ENCOUNTER — Ambulatory Visit (HOSPITAL_BASED_OUTPATIENT_CLINIC_OR_DEPARTMENT_OTHER): Payer: Commercial Managed Care - HMO | Admitting: Hematology and Oncology

## 2015-05-12 ENCOUNTER — Encounter: Payer: Self-pay | Admitting: Hematology and Oncology

## 2015-05-12 VITALS — BP 143/69 | HR 74 | Temp 98.0°F | Resp 18 | Ht 66.0 in | Wt 219.0 lb

## 2015-05-12 DIAGNOSIS — M25642 Stiffness of left hand, not elsewhere classified: Secondary | ICD-10-CM | POA: Diagnosis not present

## 2015-05-12 DIAGNOSIS — M25641 Stiffness of right hand, not elsewhere classified: Secondary | ICD-10-CM | POA: Diagnosis not present

## 2015-05-12 DIAGNOSIS — I890 Lymphedema, not elsewhere classified: Secondary | ICD-10-CM

## 2015-05-12 DIAGNOSIS — C50411 Malignant neoplasm of upper-outer quadrant of right female breast: Secondary | ICD-10-CM

## 2015-05-12 DIAGNOSIS — N951 Menopausal and female climacteric states: Secondary | ICD-10-CM

## 2015-05-12 DIAGNOSIS — Z170 Estrogen receptor positive status [ER+]: Secondary | ICD-10-CM

## 2015-05-12 NOTE — Progress Notes (Signed)
Patient Care Team: Fanny Bien, MD as PCP - General (Family Medicine)  DIAGNOSIS: Breast cancer of upper-outer quadrant of right female breast [facility])   Staging form: Breast, AJCC 7th Edition     Pathologic stage from 10/17/2014: Stage IA (T1a, N0, cM0) - Unsigned     Clinical: Stage IA (T1c, N0, M0) - Unsigned   SUMMARY OF ONCOLOGIC HISTORY:   Breast cancer of upper-outer quadrant of right female breast ([place])   07/23/2014 Mammogram Right breast (12 o'clock): New cluster of heterogenous calcs in right breast suspicious for malignancy. No other significant findings seen in right breast.   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 Grade 2. ER+ (99%), PR+ (97%), HER2- by FISH. Ki67 7%.    08/20/2014 Breast MRI Right breast: 1.8 cm enhancement at 11:00. Left breast: 7 x 9 mm circumscribed oval mass at 9:00 stable from 2010-benign. No abnormal appearing lymph nodes.   10/17/2014 Surgery Right breast lumpectomy with SLNB (Rosenbower): Grade 2, residual IDC with papillary features 0.28 cm. 0/1 right SLN. 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 ER+/PR+ HER-2 negative, Ki67 7%.   10/17/2014 Pathologic Stage  pT1a, pN0: Stage IA   11/28/2014 - 01/02/2015 Radiation Therapy Adjuvant RT completed Pablo Ledger): Right breast / 50 Gray @ 2 [name]   01/2015 -  Anti-estrogen oral therapy Anastrazole 1mg  daily. Planned duration of treatment: 5 years.    02/13/2015 Survivorship Survivorship Care Plan given/reviewed with pt.     CHIEF COMPLIANT: Follow-up on anastrozole  INTERVAL HISTORY: Carolyn Rowland is a 74 year old with above-mentioned history of right breast cancer treated with lumpectomy radiation is currently on anastrozole. She appears to be tolerating it moderately well. She does have hot flashes twice a day with profound sweating. It appears to time with eating  certain things especially sugar or coffee. She also has some mild achiness in the fingers and swelling in the morning but gets better when activity. Some mild discomfort in the neck occasionally. She also has discomfort at the surgical site she describes assess tenderness.  REVIEW OF SYSTEMS:   Constitutional: Denies fevers, chills or abnormal weight loss Eyes: Denies blurriness of vision Ears, nose, mouth, throat, and face: Denies mucositis or sore throat Respiratory: Denies cough, dyspnea or wheezes Cardiovascular: Denies palpitation, chest discomfort or lower extremity swelling Gastrointestinal:  Denies nausea, heartburn or change in bowel habits Skin: Denies abnormal skin rashes Lymphatics: Denies new lymphadenopathy or easy bruising Neurological:Denies numbness, tingling or new weaknesses Behavioral/Psych: Mood is stable, no new changes  Breast: occasional breast tenderness All other systems were reviewed with the patient and are negative.  I have reviewed the past medical history, past surgical history, social history and family history with the patient and they are unchanged from previous note.  ALLERGIES:  is allergic to penicillins.  MEDICATIONS:  Current Outpatient Prescriptions  Medication Sig Dispense Refill  . acetaminophen (TYLENOL) 650 MG CR tablet Take 1,300 mg by mouth every 8 (eight) hours as needed for pain.    Marland Kitchen anastrozole (ARIMIDEX) 1 MG tablet Take 1 tablet (1 mg total) by mouth daily. 90 tablet 3  . aspirin 81 MG chewable tablet Chew 1 tablet (81 mg total) by mouth daily.    . CRESTOR 20 MG tablet Take 20 mg by mouth daily.     . cyclobenzaprine (FLEXERIL) 5 MG tablet   1  . emollient (BIAFINE) cream Apply topically 2 (  two) times daily.    Marland Kitchen gabapentin (NEURONTIN) 300 MG capsule Take 1 capsule (300 mg total) by mouth at bedtime. (Patient not taking: Reported on 02/13/2015) 30 capsule 0  . isosorbide mononitrate (IMDUR) 60 MG 24 hr tablet Take 1 tablet (60 mg  total) by mouth daily. 30 tablet 1  . meclizine (ANTIVERT) 25 MG tablet Take 1 tablet (25 mg total) by mouth 3 (three) times daily as needed for dizziness. 30 tablet 0  . metoprolol succinate (TOPROL-XL) 50 MG 24 hr tablet Take 50 mg by mouth daily. Take with or immediately following a meal.    . Multiple Vitamins-Minerals (MULTIVITAMIN WITH MINERALS) tablet Take 1 tablet by mouth daily.    . nitroGLYCERIN (NITROSTAT) 0.4 MG SL tablet Place 1 tablet (0.4 mg total) under the tongue every 5 (five) minutes as needed for chest pain. (Patient not taking: Reported on 02/13/2015) 30 tablet 1  . omeprazole (PRILOSEC) 40 MG capsule Take 40 mg by mouth daily.    Marland Kitchen spironolactone (ALDACTONE) 25 MG tablet Take 25 mg by mouth daily.    . traMADol (ULTRAM) 50 MG tablet Take 2 tablets (100 mg total) by mouth every 8 (eight) hours as needed. (Patient taking differently: Take 100 mg by mouth every 8 (eight) hours as needed for moderate pain. ) 30 tablet 0   No current facility-administered medications for this visit.    PHYSICAL EXAMINATION: ECOG PERFORMANCE STATUS: 1 - Symptomatic but completely ambulatory  Filed Vitals:   05/12/15 1014  BP: 143/69  Pulse: 74  Temp: 98 F (36.7 C)  Resp: 18   Filed Weights   05/12/15 1014  Weight: 219 lb (99.338 kg)    GENERAL:alert, no distress and comfortable SKIN: skin color, texture, turgor are normal, no rashes or significant lesions EYES: normal, Conjunctiva are pink and non-injected, sclera clear OROPHARYNX:no exudate, no erythema and lips, buccal mucosa, and tongue normal  NECK: supple, thyroid normal size, non-tender, without nodularity LYMPH:  no palpable lymphadenopathy in the cervical, axillary or inguinal LUNGS: clear to auscultation and percussion with normal breathing effort HEART: regular rate & rhythm and no murmurs and no lower extremity edema ABDOMEN:abdomen soft, non-tender and normal bowel sounds Musculoskeletal:no cyanosis of digits and no  clubbing  NEURO: alert & oriented x 3 with fluent speech, no focal motor/sensory deficits BREAST: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 (exam performed in the presence of a chaperone)  LABORATORY DATA:  I have reviewed the data as listed   Chemistry      Component Value Date/Time   NA 143 11/07/2014 0101   K 3.7 11/07/2014 0101   CL 108 11/07/2014 0101   CO2 28 11/07/2014 0101   BUN 16 11/07/2014 0101   CREATININE 0.88 11/07/2014 0101      Component Value Date/Time   CALCIUM 9.2 11/07/2014 0101   ALKPHOS 65 11/07/2014 0101   AST 23 11/07/2014 0101   ALT 14 11/07/2014 0101   BILITOT 0.8 11/07/2014 0101       Lab Results  Component Value Date   WBC 6.9 11/07/2014   HGB 13.0 11/07/2014   HCT 37.2 11/07/2014   MCV 78.6 11/07/2014   PLT 216 11/07/2014   NEUTROABS 3.6 11/07/2014    ASSESSMENT & PLAN:  Breast cancer of upper-outer quadrant of right female breast Right breast lumpectomy 10/17/2014: 0.28 cm focus of residual IDC grade 2 with papillary features, 0/1 sentinel node, background of sclerosing lymphocytic lobulitis, fibrocystic changes, E 99%, PR 97%, HER-2 negative,  Ki-67 7%, T1a N0 M0 stage IA S/P XRT 01/02/15, Started anastrozole August 2016  Anastrozole Toxicities:  1. Hot flashes with sweats especially with certain foods that precipitate 2. Mild stiffness in the hands  Breast tenderness:I encouraged her to get postlumpectomy bras from [employer]. Right breast lymphedema: Wrote a prescription for bras.  Breast Cancer Surveillance: 1. Breast exam 05/12/2015: Mild right breast lymphedema but otherwise nodularity from the effects of surgery and radiation along the surgical scar. No palpable lymphadenopathy 2. Mammogram Annually Next one will be after Jan 13th 2017  RTC in 6 months   No orders of the defined types were placed in this encounter.   The patient has a good understanding of the overall plan. she agrees with it. she will call with any  problems that may develop before the next visit here.   Rulon Eisenmenger, MD 05/12/2015

## 2015-05-12 NOTE — Assessment & Plan Note (Signed)
Right breast lumpectomy 10/17/2014: 0.28 cm focus of residual IDC grade 2 with papillary features, 0/1 sentinel node, background of sclerosing lymphocytic lobulitis, fibrocystic changes, E 99%, PR 97%, HER-2 negative, Ki-67 7%, T1a N0 M0 stage IA S/P XRT 01/02/15, Started anastrozole August 2016  Anastrozole Toxicities: No major side effects anastrozole.   Breast Cancer Surveillance: 1. Breast exam Q 4-6 months 2. Mammogram Annually Next one will be after Jan 13th 2017  RTC in 6 months

## 2015-05-12 NOTE — Telephone Encounter (Signed)
Appointments made and avs printed for patient °

## 2015-07-03 DIAGNOSIS — I10 Essential (primary) hypertension: Secondary | ICD-10-CM | POA: Diagnosis not present

## 2015-07-03 DIAGNOSIS — J329 Chronic sinusitis, unspecified: Secondary | ICD-10-CM | POA: Diagnosis not present

## 2015-07-03 DIAGNOSIS — R05 Cough: Secondary | ICD-10-CM | POA: Diagnosis not present

## 2015-07-03 DIAGNOSIS — J309 Allergic rhinitis, unspecified: Secondary | ICD-10-CM | POA: Diagnosis not present

## 2015-08-05 DIAGNOSIS — I10 Essential (primary) hypertension: Secondary | ICD-10-CM | POA: Diagnosis not present

## 2015-08-05 DIAGNOSIS — R011 Cardiac murmur, unspecified: Secondary | ICD-10-CM | POA: Diagnosis not present

## 2015-08-05 DIAGNOSIS — I2510 Atherosclerotic heart disease of native coronary artery without angina pectoris: Secondary | ICD-10-CM | POA: Diagnosis not present

## 2015-08-05 DIAGNOSIS — Z0000 Encounter for general adult medical examination without abnormal findings: Secondary | ICD-10-CM | POA: Diagnosis not present

## 2015-08-05 DIAGNOSIS — Z23 Encounter for immunization: Secondary | ICD-10-CM | POA: Diagnosis not present

## 2015-08-05 DIAGNOSIS — E782 Mixed hyperlipidemia: Secondary | ICD-10-CM | POA: Diagnosis not present

## 2015-08-20 DIAGNOSIS — C50411 Malignant neoplasm of upper-outer quadrant of right female breast: Secondary | ICD-10-CM | POA: Diagnosis not present

## 2015-09-18 DIAGNOSIS — Z853 Personal history of malignant neoplasm of breast: Secondary | ICD-10-CM | POA: Diagnosis not present

## 2015-10-01 DIAGNOSIS — Z23 Encounter for immunization: Secondary | ICD-10-CM | POA: Diagnosis not present

## 2015-10-24 DIAGNOSIS — M25551 Pain in right hip: Secondary | ICD-10-CM | POA: Diagnosis not present

## 2015-10-29 DIAGNOSIS — R262 Difficulty in walking, not elsewhere classified: Secondary | ICD-10-CM | POA: Diagnosis not present

## 2015-10-29 DIAGNOSIS — M25551 Pain in right hip: Secondary | ICD-10-CM | POA: Diagnosis not present

## 2015-10-29 DIAGNOSIS — M545 Low back pain: Secondary | ICD-10-CM | POA: Diagnosis not present

## 2015-10-29 DIAGNOSIS — M6281 Muscle weakness (generalized): Secondary | ICD-10-CM | POA: Diagnosis not present

## 2015-11-03 DIAGNOSIS — M6281 Muscle weakness (generalized): Secondary | ICD-10-CM | POA: Diagnosis not present

## 2015-11-03 DIAGNOSIS — R262 Difficulty in walking, not elsewhere classified: Secondary | ICD-10-CM | POA: Diagnosis not present

## 2015-11-03 DIAGNOSIS — M25551 Pain in right hip: Secondary | ICD-10-CM | POA: Diagnosis not present

## 2015-11-03 DIAGNOSIS — M545 Low back pain: Secondary | ICD-10-CM | POA: Diagnosis not present

## 2015-11-05 DIAGNOSIS — M545 Low back pain: Secondary | ICD-10-CM | POA: Diagnosis not present

## 2015-11-05 DIAGNOSIS — M25551 Pain in right hip: Secondary | ICD-10-CM | POA: Diagnosis not present

## 2015-11-05 DIAGNOSIS — M6281 Muscle weakness (generalized): Secondary | ICD-10-CM | POA: Diagnosis not present

## 2015-11-05 DIAGNOSIS — R262 Difficulty in walking, not elsewhere classified: Secondary | ICD-10-CM | POA: Diagnosis not present

## 2015-11-10 ENCOUNTER — Telehealth: Payer: Self-pay | Admitting: Hematology and Oncology

## 2015-11-10 ENCOUNTER — Encounter: Payer: Self-pay | Admitting: Hematology and Oncology

## 2015-11-10 ENCOUNTER — Ambulatory Visit (HOSPITAL_BASED_OUTPATIENT_CLINIC_OR_DEPARTMENT_OTHER): Payer: Commercial Managed Care - HMO | Admitting: Hematology and Oncology

## 2015-11-10 VITALS — BP 130/83 | HR 72 | Temp 98.0°F | Resp 18 | Wt 223.4 lb

## 2015-11-10 DIAGNOSIS — C50411 Malignant neoplasm of upper-outer quadrant of right female breast: Secondary | ICD-10-CM

## 2015-11-10 DIAGNOSIS — L658 Other specified nonscarring hair loss: Secondary | ICD-10-CM

## 2015-11-10 DIAGNOSIS — N951 Menopausal and female climacteric states: Secondary | ICD-10-CM | POA: Diagnosis not present

## 2015-11-10 NOTE — Assessment & Plan Note (Addendum)
Right breast lumpectomy 10/17/2014: 0.28 cm focus of residual IDC grade 2 with papillary features, 0/1 sentinel node, background of sclerosing lymphocytic lobulitis, fibrocystic changes, E 99%, PR 97%, HER-2 negative, Ki-67 7%, T1a N0 M0 stage IA S/P XRT 01/02/15, Started anastrozole August 2016  Anastrozole Toxicities:  1. Hot flashes with sweats especially with certain foods that precipitate 2. Mild stiffness in the hands  Breast tenderness:I encouraged her to get postlumpectomy bras from [employer]. Right breast lymphedema: Wrote a prescription for bras.  Breast Cancer Surveillance: 1. Breast exam 05/12/2015: Mild right breast lymphedema but otherwise nodularity from the effects of surgery and radiation along the surgical scar. No palpable lymphadenopathy 2. Mammogram Annually  RTC in 6 months

## 2015-11-10 NOTE — Progress Notes (Signed)
Patient Care Team: Fanny Bien, MD as PCP - General (Family Medicine)  DIAGNOSIS: Breast cancer of upper-outer quadrant of right female breast [facility])   Staging form: Breast, AJCC 7th Edition     Pathologic stage from 10/17/2014: Stage IA (T1a, N0, cM0) - Unsigned     Clinical: Stage IA (T1c, N0, M0) - Unsigned   SUMMARY OF ONCOLOGIC HISTORY:   Breast cancer of upper-outer quadrant of right female breast ([place])   07/23/2014 Mammogram Right breast (12 o'clock): New cluster of heterogenous calcs in right breast suspicious for malignancy. No other significant findings seen in right breast.   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 Grade 2. ER+ (99%), PR+ (97%), HER2- by FISH. Ki67 7%.    08/20/2014 Breast MRI Right breast: 1.8 cm enhancement at 11:00. Left breast: 7 x 9 mm circumscribed oval mass at 9:00 stable from 2010-benign. No abnormal appearing lymph nodes.   10/17/2014 Surgery Right breast lumpectomy with SLNB (Rosenbower): Grade 2, residual IDC with papillary features 0.28 cm. 0/1 right SLN. 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 ER+/PR+ HER-2 negative, Ki67 7%.   10/17/2014 Pathologic Stage  pT1a, pN0: Stage IA   11/28/2014 - 01/02/2015 Radiation Therapy Adjuvant RT completed Pablo Ledger): Right breast / 50 Gray @ 2 [name]   01/2015 -  Anti-estrogen oral therapy Anastrazole 76m daily. Planned duration of treatment: 5 years.    02/13/2015 Survivorship Survivorship Care Plan given/reviewed with pt.     CHIEF COMPLIANT: Follow-up on anastrozole  INTERVAL HISTORY: Carolyn SCHWEERSis a 75year old with above-mentioned history right breast cancer currently on adjuvant anastrozole. She is complaining of hair loss. Otherwise she does have occasional hot flashes.  REVIEW OF SYSTEMS:   Constitutional: Denies fevers, chills or abnormal weight loss Eyes:  Denies blurriness of vision Ears, nose, mouth, throat, and face: Denies mucositis or sore throat Respiratory: Denies cough, dyspnea or wheezes Cardiovascular: Denies palpitation, chest discomfort Gastrointestinal:  Denies nausea, heartburn or change in bowel habits Skin: Denies abnormal skin rashes Lymphatics: Denies new lymphadenopathy or easy bruising Neurological:Denies numbness, tingling or new weaknesses Behavioral/Psych: Mood is stable, no new changes  Extremities: No lower extremity edema Breast:  denies any pain or lumps or nodules in either breasts All other systems were reviewed with the patient and are negative.  I have reviewed the past medical history, past surgical history, social history and family history with the patient and they are unchanged from previous note.  ALLERGIES:  is allergic to penicillins.  MEDICATIONS:  Current Outpatient Prescriptions  Medication Sig Dispense Refill  . acetaminophen (TYLENOL) 650 MG CR tablet Take 1,300 mg by mouth every 8 (eight) hours as needed for pain.    .Marland Kitchenanastrozole (ARIMIDEX) 1 MG tablet Take 1 tablet (1 mg total) by mouth daily. 90 tablet 3  . aspirin 81 MG chewable tablet Chew 1 tablet (81 mg total) by mouth daily.    . CRESTOR 20 MG tablet Take 20 mg by mouth daily.     . cyclobenzaprine (FLEXERIL) 5 MG tablet   1  . emollient (BIAFINE) cream Apply topically 2 (two) times daily.    .Marland Kitchengabapentin (NEURONTIN) 300 MG capsule Take 1 capsule (300 mg total) by mouth at bedtime. (Patient not taking: Reported on 02/13/2015) 30 capsule 0  . isosorbide mononitrate (IMDUR) 60 MG 24 hr tablet Take 1 tablet (60 mg total) by mouth daily. 30 tablet  1  . meclizine (ANTIVERT) 25 MG tablet Take 1 tablet (25 mg total) by mouth 3 (three) times daily as needed for dizziness. 30 tablet 0  . metoprolol succinate (TOPROL-XL) 50 MG 24 hr tablet Take 50 mg by mouth daily. Take with or immediately following a meal.    . Multiple Vitamins-Minerals  (MULTIVITAMIN WITH MINERALS) tablet Take 1 tablet by mouth daily.    . nitroGLYCERIN (NITROSTAT) 0.4 MG SL tablet Place 1 tablet (0.4 mg total) under the tongue every 5 (five) minutes as needed for chest pain. (Patient not taking: Reported on 02/13/2015) 30 tablet 1  . omeprazole (PRILOSEC) 40 MG capsule Take 40 mg by mouth daily.    Marland Kitchen spironolactone (ALDACTONE) 25 MG tablet Take 25 mg by mouth daily.    . traMADol (ULTRAM) 50 MG tablet Take 2 tablets (100 mg total) by mouth every 8 (eight) hours as needed. (Patient taking differently: Take 100 mg by mouth every 8 (eight) hours as needed for moderate pain. ) 30 tablet 0   No current facility-administered medications for this visit.    PHYSICAL EXAMINATION: ECOG PERFORMANCE STATUS: 1 - Symptomatic but completely ambulatory  Filed Vitals:   11/10/15 1021  BP: 130/83  Pulse: 72  Temp: 98 F (36.7 C)  Resp: 18   Filed Weights   11/10/15 1021  Weight: 223 lb 6.4 oz (101.334 kg)    GENERAL:alert, no distress and comfortable SKIN: skin color, texture, turgor are normal, no rashes or significant lesions EYES: normal, Conjunctiva are pink and non-injected, sclera clear OROPHARYNX:no exudate, no erythema and lips, buccal mucosa, and tongue normal  NECK: supple, thyroid normal size, non-tender, without nodularity LYMPH:  no palpable lymphadenopathy in the cervical, axillary or inguinal LUNGS: clear to auscultation and percussion with normal breathing effort HEART: regular rate & rhythm and no murmurs and no lower extremity edema ABDOMEN:abdomen soft, non-tender and normal bowel sounds MUSCULOSKELETAL:no cyanosis of digits and no clubbing  NEURO: alert & oriented x 3 with fluent speech, no focal motor/sensory deficits EXTREMITIES: No lower extremity edema BREAST: No palpable masses or nodules in either right or left breasts. No palpable axillary supraclavicular or infraclavicular adenopathy no breast tenderness or nipple discharge. (exam  performed in the presence of a chaperone)  LABORATORY DATA:  I have reviewed the data as listed   Chemistry      Component Value Date/Time   NA 143 11/07/2014 0101   K 3.7 11/07/2014 0101   CL 108 11/07/2014 0101   CO2 28 11/07/2014 0101   BUN 16 11/07/2014 0101   CREATININE 0.88 11/07/2014 0101      Component Value Date/Time   CALCIUM 9.2 11/07/2014 0101   ALKPHOS 65 11/07/2014 0101   AST 23 11/07/2014 0101   ALT 14 11/07/2014 0101   BILITOT 0.8 11/07/2014 0101       Lab Results  Component Value Date   WBC 6.9 11/07/2014   HGB 13.0 11/07/2014   HCT 37.2 11/07/2014   MCV 78.6 11/07/2014   PLT 216 11/07/2014   NEUTROABS 3.6 11/07/2014     ASSESSMENT & PLAN:  Breast cancer of upper-outer quadrant of right female breast Right breast lumpectomy 10/17/2014: 0.28 cm focus of residual IDC grade 2 with papillary features, 0/1 sentinel node, background of sclerosing lymphocytic lobulitis, fibrocystic changes, E 99%, PR 97%, HER-2 negative, Ki-67 7%, T1a N0 M0 stage IA S/P XRT 01/02/15, Started anastrozole August 2016  Anastrozole Toxicities:  1. Hot flashes with sweats especially with certain foods  that precipitate 2. Mild stiffness in the hands  Breast tenderness:I encouraged her to get postlumpectomy bras from [employer]. Right breast lymphedema: Wrote a prescription for bras.  Breast Cancer Surveillance: 1. Breast exam 05/12/2015: Mild right breast lymphedema but otherwise nodularity from the effects of surgery and radiation along the surgical scar. No palpable lymphadenopathy 2. Mammogram Annually Done at [facility].  RTC in 6 months   No orders of the defined types were placed in this encounter.   The patient has a good understanding of the overall plan. she agrees with it. she will call with any problems that may develop before the next visit here.   Rulon Eisenmenger, MD 11/10/2015

## 2015-11-10 NOTE — Telephone Encounter (Signed)
appt made and avs printed °

## 2015-11-12 ENCOUNTER — Ambulatory Visit: Payer: Commercial Managed Care - HMO | Attending: Hematology and Oncology | Admitting: Physical Therapy

## 2015-11-12 DIAGNOSIS — L599 Disorder of the skin and subcutaneous tissue related to radiation, unspecified: Secondary | ICD-10-CM | POA: Diagnosis not present

## 2015-11-12 DIAGNOSIS — I890 Lymphedema, not elsewhere classified: Secondary | ICD-10-CM | POA: Diagnosis not present

## 2015-11-12 NOTE — Therapy (Signed)
[place], [place], [address] Phone: [phone]   Fax:  [phone]  Physical Therapy Evaluation  Patient Details  Name: Carolyn Rowland MRN: [id number] Date of Birth: 23-Feb-1941 Referring Provider: Dr. Nicholas Lose  Encounter Date: 11/12/2015      PT End of Session - 11/12/15 2144    Visit Number 1   Number of Visits 3  to 5 as needed   Date for PT Re-Evaluation 12/12/15   PT Start Time 0938   PT Stop Time 1019   PT Time Calculation (min) 41 min   Activity Tolerance Patient tolerated treatment well   Behavior During Therapy [facility] for tasks assessed/performed      Past Medical History  Diagnosis Date  . Heart murmur   . Hypertension   . High cholesterol   . Pneumonia 1970's    "once"  . H/O hiatal hernia   . GERD (gastroesophageal reflux disease)   . Arthritis     "hands; legs" (08/17/2013)  . CAP (community acquired pneumonia)   . Aortic stenosis     mild to moderate AS 07/2013  . Vertigo   . Radiation 11/28/14-01/02/15    Right breast 50 Pearline Cables    Past Surgical History  Procedure Laterality Date  . Cardiac catheterization  08/07/2013  . Coronary angioplasty with stent placement  08/17/2013    "6 stents"(08/17/2013)  . Appendectomy  1960  . Abdominal hysterectomy  1985  . Tubal ligation  1980  . Left and right heart catheterization with coronary angiogram N/A 08/07/2013    Procedure: LEFT AND RIGHT HEART CATHETERIZATION WITH CORONARY ANGIOGRAM;  Surgeon: Laverda Page, MD;  Location: [facility] CATH LAB;  Service: Cardiovascular;  Laterality: N/A;  . Percutaneous coronary stent intervention (pci-s) N/A 08/17/2013    Procedure: PERCUTANEOUS CORONARY STENT INTERVENTION (PCI-S);  Surgeon: Laverda Page, MD;  Location: [facility] CATH LAB;  Service: Cardiovascular;  Laterality: N/A;  . Breast lumpectomy with needle localization and axillary sentinel lymph node bx Right 10/17/2014    Procedure: BREAST  LUMPECTOMY WITH NEEDLE LOCALIZATION AND AXILLARY SENTINEL LYMPH NODE BX;  Surgeon: Jackolyn Confer, MD;  Location: [place];  Service: General;  Laterality: Right;    There were no vitals filed for this visit.       Subjective Assessment - 11/12/15 0938    Subjective Leg hurts.   Pertinent History Right breast lumpectomy 10/17/2014: 0.28 cm focus of residual IDC grade 2 with papillary features, 0/1 sentinel node, background of sclerosing lymphocytic lobulitis, fibrocystic changes, E 99%, PR 97%, HER-2 negative, Ki-67 7%, T1a N0 M0 stage IA S/P XRT 01/02/15, Started anastrozole August 2016.  Reports she has been diagnosed with spinal stenosis.  Had stents put in February 2014; did cardiac rehab then.   Currently in Pain? Yes   Pain Score 7    Pain Location Groin  upper thigh and back   Pain Orientation Right   Pain Descriptors / Indicators Tightness   Aggravating Factors  sitting and then getting up from sitting   Pain Relieving Factors wait a minute before getting going; ibuprofen            [facility] PT Assessment - 11/12/15 0001    Assessment   Medical Diagnosis right breast cancer   Referring Provider Dr. Nicholas Lose   Onset Date/Surgical Date 10/27/14   Precautions   Precautions Other (comment)   Precaution Comments [place] precautions   Restrictions   Weight Bearing Restrictions No  Balance Screen   Has the patient fallen in the past 6 months No   Has the patient had a decrease in activity level because of a fear of falling?  No  but is cautious   Is the patient reluctant to leave their home because of a fear of falling?  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   Cognition   Overall Cognitive Status Within Functional Limits for tasks assessed   Observation/Other Assessments   Observations Right breast is smaller than left, but firmer and shows puffiness around incision at upper outer quadrant.   Skin Integrity incisions at breast and axilla well-healed; right breast area is darkened from radiation   Other Surveys  --  lymphedema life impact scale score of 21 = 35% impairment   ROM / Strength   AROM / PROM / Strength AROM   AROM   AROM Assessment Site Shoulder   Right/Left Shoulder Right;Left   Right Shoulder Flexion 137 Degrees   Right Shoulder ABduction --  [facility]   Left Shoulder Flexion 135 Degrees   Left Shoulder ABduction --  [facility]   Ambulation/Gait   Ambulation/Gait Yes   Ambulation/Gait Assistance 7: Independent   Gait Comments but walks for the first step or two with significant difficulty           LYMPHEDEMA/ONCOLOGY QUESTIONNAIRE - 11/12/15 0956    Type   Cancer Type right breast   Surgeries   Lumpectomy Date 10/27/14   Number Lymph Nodes Removed 3   Treatment   Past Radiation Treatment Yes   Date 01/03/15   Current Hormone Treatment Yes   Drug Name Arimidex   Lymphedema Assessments   Lymphedema Assessments Upper extremities   Right Upper Extremity Lymphedema   15 cm Proximal to Olecranon Process 39.7 cm   Olecranon Process 27.2 cm   15 cm Proximal to Ulnar Styloid Process 27 cm   Just Proximal to Ulnar Styloid Process 17.5 cm   Across Hand at [employer] 20.4 cm   At [place] of 2nd Digit 7.1 cm   Left Upper Extremity Lymphedema   15 cm Proximal to Olecranon Process 38.7 cm   Olecranon Process 28.2 cm   15 cm Proximal to Ulnar Styloid Process 27.6 cm   Just Proximal to Ulnar Styloid Process 17.7 cm   Across Hand at [employer] 20.2 cm   At [place] of 2nd Digit 7 cm                                Long Term Clinic Goals - 11/12/15 2151     CC Long Term Goal  #1   Title Patient will be independent in self-manual lymph drainage for the right breast.   Time 2   Period Weeks   Status New   CC Long Term Goal  #2   Title Patient will be knowledgeable about compression bra or other compression options for right breast swelling.   Time 2   Period Weeks   Status New  Plan - 11/12/15 2145    Clinical Impression Statement Patient who is a year s/p right breast lumpectomy and radiation that was finished in summer 2016 presents with the appearance of swelling in the right breast along with some firmness of tissue there, particularly around incision.  The right breast actually appears smaller than the left but at the same time looks and feels of fullness.  She would like to be educated in self-care techniques.  She has right hip pain and is receiving therapy elsewhere for that.   Rehab Potential Good   PT Frequency Other (comment)  2 visits total (up to 4 as needed)   PT Treatment/Interventions ADLs/Self Care Home Management;DME Instruction;Therapeutic exercise;Patient/family education;Manual techniques;Manual lymph drainage   PT Next Visit Plan Do and teach manual lymph drainage for right breast; at second visit, review as needed.  Assist with appropriate compression bra.   Consulted and Agree with Plan of Care Patient      Patient will benefit from skilled therapeutic intervention in order to improve the following deficits and impairments:  Decreased knowledge of precautions, Decreased knowledge of use of DME, Increased edema  Visit Diagnosis: Lymphedema, not elsewhere classified - Plan: PT plan of care cert/re-cert  Disorder of the skin and subcutaneous tissue related to radiation, unspecified - Plan: PT plan of care cert/re-cert      G-Codes - 90/12/22 2152    Functional Assessment Tool Used lymphedema life impact scale   Functional Limitation Carrying, moving and handling objects   Carrying, Moving and Handling  Objects Current Status (I1146) At least 20 percent but less than 40 percent impaired, limited or restricted   Carrying, Moving and Handling Objects Goal Status (W3142) At least 1 percent but less than 20 percent impaired, limited or restricted       Problem List Patient Active Problem List   Diagnosis Date Noted  . CAP (community acquired pneumonia) 09/04/2014  . Leukocytosis 09/04/2014  . Hypokalemia 09/04/2014  . Breast cancer of upper-outer quadrant of right female breast ([place]) 08/27/2014  . Coronary atherosclerosis of native coronary artery 08/18/2013  . S/P PTCA (percutaneous transluminal coronary angioplasty) 08/17/2013    [place],Carolyn Rowland 11/12/2015, 9:55 PM  [place], [place], [address] Phone: [phone]   Fax:  [phone]  Name: MARZETTA LANZA MRN: [id number] Date of Birth: Mar 18, 1941   [name], PT 11/12/2015 9:55 PM

## 2015-11-19 ENCOUNTER — Ambulatory Visit: Payer: Commercial Managed Care - HMO | Admitting: Physical Therapy

## 2015-11-19 DIAGNOSIS — I890 Lymphedema, not elsewhere classified: Secondary | ICD-10-CM | POA: Diagnosis not present

## 2015-11-19 DIAGNOSIS — L599 Disorder of the skin and subcutaneous tissue related to radiation, unspecified: Secondary | ICD-10-CM | POA: Diagnosis not present

## 2015-11-19 NOTE — Therapy (Signed)
[place], [place], [address] Phone: [phone]   Fax:  [phone]  Physical Therapy Treatment  Patient Details  Name: Carolyn Rowland MRN: [id number] Date of Birth: March 13, 1941 Referring Provider: Dr. Nicholas Lose  Encounter Date: 11/19/2015      PT End of Session - 11/19/15 1720    Visit Number 2   Number of Visits 3  to 5 as  needed   Date for PT Re-Evaluation 12/12/15   PT Start Time 1303   PT Stop Time [id number]   PT Time Calculation (min) 44 min   Activity Tolerance Patient tolerated treatment well   Behavior During Therapy [facility] for tasks assessed/performed      Past Medical History  Diagnosis Date  . Heart murmur   . Hypertension   . High cholesterol   . Pneumonia 1970's    "once"  . H/O hiatal hernia   . GERD (gastroesophageal reflux disease)   . Arthritis     "hands; legs" (08/17/2013)  . CAP (community acquired pneumonia)   . Aortic stenosis     mild to moderate AS 07/2013  . Vertigo   . Radiation 11/28/14-01/02/15    Right breast 50 [address] Cables    Past Surgical History  Procedure Laterality Date  . Cardiac catheterization  08/07/2013  . Coronary angioplasty with stent placement  08/17/2013    "6 stents"(08/17/2013)  . Appendectomy  1960  . Abdominal hysterectomy  1985  . Tubal ligation  1980  . Left and right heart catheterization with coronary angiogram N/A 08/07/2013    Procedure: LEFT AND RIGHT HEART CATHETERIZATION WITH CORONARY ANGIOGRAM;  Surgeon: Laverda Page, MD;  Location: [facility] CATH LAB;  Service: Cardiovascular;  Laterality: N/A;  . Percutaneous coronary stent intervention (pci-s) N/A 08/17/2013    Procedure: PERCUTANEOUS CORONARY STENT INTERVENTION (PCI-S);  Surgeon: Laverda Page, MD;  Location: [facility] CATH LAB;  Service: Cardiovascular;  Laterality: N/A;  . Breast lumpectomy with needle localization and axillary sentinel lymph node bx Right 10/17/2014    Procedure: BREAST  LUMPECTOMY WITH NEEDLE LOCALIZATION AND AXILLARY SENTINEL LYMPH NODE BX;  Surgeon: Jackolyn Confer, MD;  Location: [place];  Service: General;  Laterality: Right;    There were no vitals filed for this visit.      Subjective Assessment - 11/19/15 1305    Subjective Nothing new.  Not sure she is ready to start this therapy.   Currently in Pain? Yes   Pain Score 6    Pain Location Leg   Pain Orientation Right   Aggravating Factors  when she first gets up   Pain Relieving Factors time                         [facility] Adult PT Treatment/Exercise - 11/19/15 0001    Self-Care   Self-Care Other Self-Care Comments   Other Self-Care Comments  Began instructing patient in principles and techniques of manual lymph drainage without having her perform this yet.  Fashioned a rectangle of 1/2 inch gray foam in TG soft and had patient place it in her bra for added compression, especially at site of incision which is quite firm.   Manual Therapy   Manual Therapy Manual Lymphatic Drainage (MLD)   Manual Lymphatic Drainage (MLD) Short neck, superficial and deep abdomen, left axilla and anterior interaxillary anastomosis, right groin and axillo-inguinal anastomosis, and right breast, directing toward pathways, all in supine.  In left sidelying,  lateral breast, posterior interaxillary anastomosis right to left, and right axillo-inguinal anastomosis.                        [facility] Goals - 11/19/15 1722    CC Long Term Goal  #1   Title Patient will be independent in self-manual lymph drainage for the right breast.   Status On-going   CC Long Term Goal  #2   Title Patient will be knowledgeable about compression bra or other compression options for right breast swelling.   Status On-going            Plan - 11/19/15 1721    Clinical Impression Statement Patient did well with initial treatment today and was attentive for instruction.     Rehab Potential Good   PT  Frequency Other (comment)  2 visits (up to 4 as needed)   PT Treatment/Interventions ADLs/Self Care Home Management;DME Instruction;Therapeutic exercise;Patient/family education;Manual techniques;Manual lymph drainage   PT Next Visit Plan Have patient perform manual lymph drainage and give her handout.  Assist with compression bra.  Assess whether ready for discharge.   Consulted and Agree with Plan of Care Patient      Patient will benefit from skilled therapeutic intervention in order to improve the following deficits and impairments:  Decreased knowledge of precautions, Decreased knowledge of use of DME, Increased edema  Visit Diagnosis: Lymphedema, not elsewhere classified  Disorder of the skin and subcutaneous tissue related to radiation, unspecified     Problem List Patient Active Problem List   Diagnosis Date Noted  . CAP (community acquired pneumonia) 09/04/2014  . Leukocytosis 09/04/2014  . Hypokalemia 09/04/2014  . Breast cancer of upper-outer quadrant of right female breast ([place]) 08/27/2014  . Coronary atherosclerosis of native coronary artery 08/18/2013  . S/P PTCA (percutaneous transluminal coronary angioplasty) 08/17/2013    Jackqueline Aquilar 11/19/2015, 5:24 PM  [place], [place], [address] Phone: [phone]   Fax:  [phone]  Name: IREMIDE BELLAIRE MRN: [id number] Date of Birth: 11/19/1940    [name], PT 11/19/2015 5:24 PM

## 2015-11-21 ENCOUNTER — Ambulatory Visit: Payer: Commercial Managed Care - HMO | Admitting: Physical Therapy

## 2015-11-24 ENCOUNTER — Ambulatory Visit: Payer: Commercial Managed Care - HMO | Admitting: Physical Therapy

## 2015-11-24 ENCOUNTER — Encounter: Payer: Self-pay | Admitting: Physical Therapy

## 2015-11-24 DIAGNOSIS — L599 Disorder of the skin and subcutaneous tissue related to radiation, unspecified: Secondary | ICD-10-CM

## 2015-11-24 DIAGNOSIS — I890 Lymphedema, not elsewhere classified: Secondary | ICD-10-CM

## 2015-11-24 NOTE — Patient Instructions (Signed)
Self manual lymph drainage: Perform this sequence once a day.  Only give enough pressure no your skin to make the skin move.  Diaphragmatic - Supine   Inhale through nose making navel move out toward hands. Exhale through puckered lips, hands follow navel in. Repeat _5__ times. Rest _10__ seconds between repeats.   Copyright  VHI. All rights reserved.  Hug yourself.  Do circles at your neck just above your collarbones.  Repeat this 10 times.  Axilla - One at a Time   Using full weight of flat hand and fingers at center of uninvolved (left) armpit, make _10__ in-place circles.   Copyright  VHI. All rights reserved.  LEG: Inguinal Nodes Stimulation   With small finger side of hand against hip crease on involved (right) side, gently perform circles at the crease. Repeat __10_ times.   Copyright  VHI. All rights reserved.  1) Axilla to Inguinal Nodes - Sweep   On involved (right) side, sweep _4__ times from armpit along side of trunk to hip crease.  Now gently stretch skin from the involved side to the uninvolved side across the chest at the shoulder line.  Repeat that 4 times.  Draw an imaginary diagonal line from upper outer breast through the nipple area toward lower inner breast.  Direct fluid upward and inward from this line toward the pathway across your upper chest .  Do this in three rows to treat all of the upper inner breast tissue, and do each row 3-4x.      Direct fluid to treat all of lower outer breast tissue downward and outward toward      pathway that is aimed at the left groin.  Finish by doing the pathways as described above going from your involved armpit to the same side groin and going across your upper chest from the involved shoulder to the uninvolved shoulder.  Repeat the steps above where you do circles in your left groin and right armpit. Copyright  VHI. All rights reserved.

## 2015-11-24 NOTE — Therapy (Signed)
[place], [place], [address] Phone: [phone]   Fax:  [phone]  Physical Therapy Treatment  Patient Details  Name: Carolyn Rowland MRN: [id number] Date of Birth: 1940-07-27 Referring Provider: Dr. Nicholas Lose  Encounter Date: 11/24/2015      PT End of Session - 11/24/15 1452    Visit Number 3   Number of Visits 5   PT Start Time 1115   PT Stop Time 1448   PT Time Calculation (min) 59 min   Activity Tolerance Patient tolerated treatment well   Behavior During Therapy [facility] for tasks assessed/performed      Past Medical History  Diagnosis Date  . Heart murmur   . Hypertension   . High cholesterol   . Pneumonia 1970's    "once"  . H/O hiatal hernia   . GERD (gastroesophageal reflux disease)   . Arthritis     "hands; legs" (08/17/2013)  . CAP (community acquired pneumonia)   . Aortic stenosis     mild to moderate AS 07/2013  . Vertigo   . Radiation 11/28/14-01/02/15    Right breast 50 Pearline Cables    Past Surgical History  Procedure Laterality Date  . Cardiac catheterization  08/07/2013  . Coronary angioplasty with stent placement  08/17/2013    "6 stents"(08/17/2013)  . Appendectomy  1960  . Abdominal hysterectomy  1985  . Tubal ligation  1980  . Left and right heart catheterization with coronary angiogram N/A 08/07/2013    Procedure: LEFT AND RIGHT HEART CATHETERIZATION WITH CORONARY ANGIOGRAM;  Surgeon: Laverda Page, MD;  Location: [facility] CATH LAB;  Service: Cardiovascular;  Laterality: N/A;  . Percutaneous coronary stent intervention (pci-s) N/A 08/17/2013    Procedure: PERCUTANEOUS CORONARY STENT INTERVENTION (PCI-S);  Surgeon: Laverda Page, MD;  Location: [facility] CATH LAB;  Service: Cardiovascular;  Laterality: N/A;  . Breast lumpectomy with needle localization and axillary sentinel lymph node bx Right 10/17/2014    Procedure: BREAST LUMPECTOMY WITH NEEDLE LOCALIZATION AND AXILLARY SENTINEL  LYMPH NODE BX;  Surgeon: Jackolyn Confer, MD;  Location: [place];  Service: General;  Laterality: Right;    There were no vitals filed for this visit.      Subjective Assessment - 11/24/15 1352    Subjective The foam helped in my bra. I would like to come one more time after today to make sure I can do the massage. The foam seemed to help the tenderness.    Pertinent History Right breast lumpectomy 10/17/2014: 0.28 cm focus of residual IDC grade 2 with papillary features, 0/1 sentinel node, background of sclerosing lymphocytic lobulitis, fibrocystic changes, E 99%, PR 97%, HER-2 negative, Ki-67 7%, T1a N0 M0 stage IA S/P XRT 01/02/15, Started anastrozole August 2016.  Reports she has been diagnosed with spinal stenosis.  Had stents put in February 2014; did cardiac rehab then.   Currently in Pain? No/denies   Pain Score 0-No pain                         [facility] Adult PT Treatment/Exercise - 11/24/15 0001    Manual Therapy   Manual Therapy Manual Lymphatic Drainage (MLD)   Manual Lymphatic Drainage (MLD) instructed pt in self manual lymphatic drainage and gave pt handout. Walked pt through step by step and explained hand placement, pressure and direction of stretch with pt able to return demonstrate with max verbal and min tactile cues.  [facility] Goals - 11/19/15 1722    CC Long Term Goal  #1   Title Patient will be independent in self-manual lymph drainage for the right breast.   Status On-going   CC Long Term Goal  #2   Title Patient will be knowledgeable about compression bra or other compression options for right breast swelling.   Status On-going            Plan - 11/24/15 1453    Clinical Impression Statement Instructed pt in self manual lymphatic drainage for right breast. Pt was given a handout with instruction. Guided pt through step by step instructions and gave max verbal and min tactile cues to pt to perform  exercise corectly. Following manual lymphatic drainage pt stated she was feeling dizzy. She had not eaten much and felt this may be the cause. She also forgot to take her blood pressure medication. Pt's blood pressure was taken but pulse was hard to hear. Pt states it is hard to find. Blood pressure taken in left upper extremiity was 160/90. Brought pt water and offered some candy if blood pressure was low. Pt drank the water but left before eating any candy because she stated she lived close to home and had to take her son to work, take her medication and get something to eat. Pt stated she was safe to drive.    Rehab Potential Good   PT Frequency Other (comment)   PT Treatment/Interventions ADLs/Self Care Home Management;DME Instruction;Therapeutic exercise;Patient/family education;Manual techniques;Manual lymph drainage   PT Next Visit Plan assess how pt did with self drainage instruction, assist with compression bra   Consulted and Agree with Plan of Care Patient      Patient will benefit from skilled therapeutic intervention in order to improve the following deficits and impairments:  Decreased knowledge of precautions, Decreased knowledge of use of DME, Increased edema  Visit Diagnosis: Lymphedema, not elsewhere classified  Disorder of the skin and subcutaneous tissue related to radiation, unspecified     Problem List Patient Active Problem List   Diagnosis Date Noted  . CAP (community acquired pneumonia) 09/04/2014  . Leukocytosis 09/04/2014  . Hypokalemia 09/04/2014  . Breast cancer of upper-outer quadrant of right female breast ([place]) 08/27/2014  . Coronary atherosclerosis of native coronary artery 08/18/2013  . S/P PTCA (percutaneous transluminal coronary angioplasty) 08/17/2013    Alexia Freestone 11/24/2015, 2:57 PM  [place], [place], [address] Phone: [phone]   Fax:   [phone]  Name: Carolyn Rowland MRN: [id number] Date of Birth: December 23, 1940    Allyson Sabal, PT 11/24/2015 2:57 PM

## 2015-11-26 ENCOUNTER — Ambulatory Visit: Payer: Commercial Managed Care - HMO

## 2015-11-26 DIAGNOSIS — I890 Lymphedema, not elsewhere classified: Secondary | ICD-10-CM | POA: Diagnosis not present

## 2015-11-26 DIAGNOSIS — L599 Disorder of the skin and subcutaneous tissue related to radiation, unspecified: Secondary | ICD-10-CM | POA: Diagnosis not present

## 2015-11-26 NOTE — Therapy (Addendum)
[facility] [address] [place], [place], [address] Phone: [phone]   Fax:  [phone]  Physical Therapy Treatment  Patient Details  Name: Carolyn Rowland MRN: [id number] Date of Birth: 09/09/1940 Referring Provider: Dr. Vinay Gudena  Encounter Date: 11/26/2015      PT End of Session - 11/26/15 1436    Visit Number 4   Number of Visits 5   Date for PT Re-Evaluation 12/12/15   PT Start Time 1351   PT Stop Time 1434   PT Time Calculation (min) 43 min   Activity Tolerance Patient tolerated treatment well   Behavior During Therapy WFL for tasks assessed/performed      Past Medical History  Diagnosis Date  . Heart murmur   . Hypertension   . High cholesterol   . Pneumonia 1970's    "once"  . H/O hiatal hernia   . GERD (gastroesophageal reflux disease)   . Arthritis     "hands; legs" (08/17/2013)  . CAP (community acquired pneumonia)   . Aortic stenosis     mild to moderate AS 07/2013  . Vertigo   . Radiation 11/28/14-01/02/15    Right breast 50 Gray    Past Surgical History  Procedure Laterality Date  . Cardiac catheterization  08/07/2013  . Coronary angioplasty with stent placement  08/17/2013    "6 stents"(08/17/2013)  . Appendectomy  1960  . Abdominal hysterectomy  1985  . Tubal ligation  1980  . Left and right heart catheterization with coronary angiogram N/A 08/07/2013    Procedure: LEFT AND RIGHT HEART CATHETERIZATION WITH CORONARY ANGIOGRAM;  Surgeon: Jagadeesh R Ganji, MD;  Location: [facility] CATH LAB;  Service: Cardiovascular;  Laterality: N/A;  . Percutaneous coronary stent intervention (pci-s) N/A 08/17/2013    Procedure: PERCUTANEOUS CORONARY STENT INTERVENTION (PCI-S);  Surgeon: Jagadeesh R Ganji, MD;  Location: [facility] CATH LAB;  Service: Cardiovascular;  Laterality: N/A;  . Breast lumpectomy with needle localization and axillary sentinel lymph node bx Right 10/17/2014    Procedure: BREAST LUMPECTOMY WITH NEEDLE  LOCALIZATION AND AXILLARY SENTINEL LYMPH NODE BX;  Surgeon: Todd Rosenbower, MD;  Location: [facility] OR;  Service: General;  Laterality: Right;    There were no vitals filed for this visit.      Subjective Assessment - 11/26/15 1359    Subjective Been doing the massage and the tenderness is so much better. I feel like I can touch my breast without being scared, and the orange peel look is gone too.    Pertinent History Right breast lumpectomy 10/17/2014: 0.28 cm focus of residual IDC grade 2 with papillary features, 0/1 sentinel node, background of sclerosing lymphocytic lobulitis, fibrocystic changes, E 99%, PR 97%, HER-2 negative, Ki-67 7%, T1a N0 M0 stage IA S/P XRT 01/02/15, Started anastrozole August 2016.  Reports she has been diagnosed with spinal stenosis.  Had stents put in February 2014; did cardiac rehab then.   Currently in Pain? No/denies                         [facility] Adult PT Treatment/Exercise - 11/26/15 0001    Manual Therapy   Manual Therapy Manual Lymphatic Drainage (MLD)   Manual Lymphatic Drainage (MLD) Reviewed with pt self manual lymphatic drainage reviewing hand placement, pressure and direction of stretch with pt able to return demonstrate with min verbal and min tactile cues.                   PT Education - 11/26/15 1435    Education provided Yes   Education Details Reviewed self manual lymph drainage   Person(s) Educated Patient   Methods Explanation;Demonstration   Comprehension Verbalized understanding;Returned demonstration                [facility] Goals - 11/26/15 1435    CC Long Term Goal  #1   Title Patient will be independent in self-manual lymph drainage for the right breast.   Status Achieved   CC Long Term Goal  #2   Title Patient will be knowledgeable about compression bra or other compression options for right breast swelling.   Status Achieved            Plan - 11/26/15 1436    Clinical Impression  Statement Pt did very well with review of self manual lymph drinage today only requiring minor cuing for technique/pressure. She is going to [facility] [place] soon to be fitted for compression bras, has met all goals and feels ready for D/C at this time.    Rehab Potential Good   PT Frequency Other (comment)   PT Treatment/Interventions ADLs/Self Care Home Management;DME Instruction;Therapeutic exercise;Patient/family education;Manual techniques;Manual lymph drainage   PT Next Visit Plan D/C this visit.    Consulted and Agree with Plan of Care Patient      Patient will benefit from skilled therapeutic intervention in order to improve the following deficits and impairments:  Decreased knowledge of precautions, Decreased knowledge of use of DME, Increased edema  Visit Diagnosis: Lymphedema, not elsewhere classified  Disorder of the skin and subcutaneous tissue related to radiation, unspecified     Problem List Patient Active Problem List   Diagnosis Date Noted  . CAP (community acquired pneumonia) 09/04/2014  . Leukocytosis 09/04/2014  . Hypokalemia 09/04/2014  . Breast cancer of upper-outer quadrant of right female breast ([place]) 08/27/2014  . Coronary atherosclerosis of native coronary artery 08/18/2013  . S/P PTCA (percutaneous transluminal coronary angioplasty) 08/17/2013    Golda Acre 11/26/2015, 2:38 PM  [place], [place], [address] Phone: [phone]   Fax:  [phone]  Name: Carolyn Rowland MRN: [id number] Date of Birth: October 04, 1940    PHYSICAL THERAPY DISCHARGE SUMMARY  Visits from Start of Care: 4  Current functional level related to goals / functional outcomes: Goals met as noted above.   Remaining deficits: Swelling will continue to require management.   Education / Equipment: Self-manual lymph drainage, use of compression. Plan: Patient agrees to discharge.   Patient goals were met. Patient is being discharged due to meeting the stated rehab goals.  ?????    Carolyn Rowland, PT 11/26/2015 5:25 PM

## 2015-12-03 ENCOUNTER — Other Ambulatory Visit: Payer: Self-pay | Admitting: Cardiology

## 2015-12-03 DIAGNOSIS — I10 Essential (primary) hypertension: Secondary | ICD-10-CM | POA: Diagnosis not present

## 2015-12-03 DIAGNOSIS — E669 Obesity, unspecified: Secondary | ICD-10-CM | POA: Diagnosis not present

## 2015-12-03 DIAGNOSIS — R0609 Other forms of dyspnea: Secondary | ICD-10-CM | POA: Diagnosis not present

## 2015-12-03 DIAGNOSIS — I25119 Atherosclerotic heart disease of native coronary artery with unspecified angina pectoris: Secondary | ICD-10-CM | POA: Diagnosis not present

## 2015-12-03 DIAGNOSIS — R002 Palpitations: Secondary | ICD-10-CM | POA: Diagnosis not present

## 2015-12-03 DIAGNOSIS — R079 Chest pain, unspecified: Secondary | ICD-10-CM

## 2015-12-03 DIAGNOSIS — I38 Endocarditis, valve unspecified: Secondary | ICD-10-CM | POA: Diagnosis not present

## 2015-12-03 DIAGNOSIS — F17290 Nicotine dependence, other tobacco product, uncomplicated: Secondary | ICD-10-CM | POA: Diagnosis not present

## 2015-12-03 DIAGNOSIS — E785 Hyperlipidemia, unspecified: Secondary | ICD-10-CM | POA: Diagnosis not present

## 2015-12-08 ENCOUNTER — Encounter (HOSPITAL_COMMUNITY): Payer: Commercial Managed Care - HMO

## 2015-12-08 ENCOUNTER — Encounter (HOSPITAL_COMMUNITY): Admission: RE | Admit: 2015-12-08 | Payer: Commercial Managed Care - HMO | Source: Ambulatory Visit

## 2015-12-11 DIAGNOSIS — I38 Endocarditis, valve unspecified: Secondary | ICD-10-CM | POA: Diagnosis not present

## 2015-12-11 DIAGNOSIS — E785 Hyperlipidemia, unspecified: Secondary | ICD-10-CM | POA: Diagnosis not present

## 2015-12-11 DIAGNOSIS — I10 Essential (primary) hypertension: Secondary | ICD-10-CM | POA: Diagnosis not present

## 2015-12-11 DIAGNOSIS — R0609 Other forms of dyspnea: Secondary | ICD-10-CM | POA: Diagnosis not present

## 2015-12-11 DIAGNOSIS — R002 Palpitations: Secondary | ICD-10-CM | POA: Diagnosis not present

## 2015-12-11 DIAGNOSIS — I25119 Atherosclerotic heart disease of native coronary artery with unspecified angina pectoris: Secondary | ICD-10-CM | POA: Diagnosis not present

## 2015-12-11 DIAGNOSIS — F17290 Nicotine dependence, other tobacco product, uncomplicated: Secondary | ICD-10-CM | POA: Diagnosis not present

## 2015-12-12 ENCOUNTER — Ambulatory Visit (HOSPITAL_COMMUNITY): Payer: Commercial Managed Care - HMO

## 2015-12-12 ENCOUNTER — Ambulatory Visit (HOSPITAL_COMMUNITY): Admission: RE | Admit: 2015-12-12 | Payer: Commercial Managed Care - HMO | Source: Ambulatory Visit

## 2015-12-22 ENCOUNTER — Encounter (HOSPITAL_COMMUNITY): Admission: RE | Admit: 2015-12-22 | Payer: Commercial Managed Care - HMO | Source: Ambulatory Visit

## 2015-12-22 ENCOUNTER — Encounter (HOSPITAL_COMMUNITY): Payer: Commercial Managed Care - HMO

## 2015-12-23 ENCOUNTER — Other Ambulatory Visit: Payer: Self-pay | Admitting: Hematology and Oncology

## 2015-12-23 NOTE — Telephone Encounter (Signed)
Chart reviewed.

## 2015-12-29 ENCOUNTER — Encounter (HOSPITAL_COMMUNITY)
Admission: RE | Admit: 2015-12-29 | Discharge: 2015-12-29 | Disposition: A | Discharge status: Home / Self Care | Payer: Commercial Managed Care - HMO | Source: Ambulatory Visit | Attending: Cardiology | Admitting: Cardiology

## 2015-12-29 ENCOUNTER — Ambulatory Visit (HOSPITAL_COMMUNITY)
Admission: RE | Admit: 2015-12-29 | Discharge: 2015-12-29 | Disposition: A | Discharge status: Home / Self Care | Payer: Commercial Managed Care - HMO | Source: Ambulatory Visit | Attending: Cardiology | Admitting: Cardiology

## 2015-12-29 DIAGNOSIS — I10 Essential (primary) hypertension: Secondary | ICD-10-CM | POA: Diagnosis not present

## 2015-12-29 DIAGNOSIS — E669 Obesity, unspecified: Secondary | ICD-10-CM | POA: Diagnosis not present

## 2015-12-29 DIAGNOSIS — R0609 Other forms of dyspnea: Secondary | ICD-10-CM | POA: Diagnosis not present

## 2015-12-29 DIAGNOSIS — I38 Endocarditis, valve unspecified: Secondary | ICD-10-CM | POA: Diagnosis not present

## 2015-12-29 DIAGNOSIS — R079 Chest pain, unspecified: Secondary | ICD-10-CM | POA: Insufficient documentation

## 2015-12-29 DIAGNOSIS — I25119 Atherosclerotic heart disease of native coronary artery with unspecified angina pectoris: Secondary | ICD-10-CM | POA: Diagnosis not present

## 2015-12-29 DIAGNOSIS — R002 Palpitations: Secondary | ICD-10-CM | POA: Diagnosis not present

## 2015-12-29 DIAGNOSIS — E785 Hyperlipidemia, unspecified: Secondary | ICD-10-CM | POA: Diagnosis not present

## 2015-12-29 DIAGNOSIS — F17290 Nicotine dependence, other tobacco product, uncomplicated: Secondary | ICD-10-CM | POA: Diagnosis not present

## 2015-12-29 DIAGNOSIS — R0602 Shortness of breath: Secondary | ICD-10-CM | POA: Diagnosis not present

## 2015-12-29 LAB — BASIC METABOLIC PANEL
Anion gap: 8 (ref 5–15)
BUN: 12 mg/dL (ref 6–20)
CO2: 26 mmol/L (ref 22–32)
Calcium: 9.4 mg/dL (ref 8.9–10.3)
Chloride: 106 mmol/L (ref 101–111)
Creatinine, Ser: 0.8 mg/dL (ref 0.44–1.00)
GFR calc Af Amer: >60 mL/min (ref >60)
GFR calc non Af Amer: >60 mL/min (ref >60)
Glucose, Bld: 91 mg/dL (ref 65–99)
Potassium: 4.5 mmol/L (ref 3.5–5.1)
Sodium: 140 mmol/L (ref 135–145)

## 2015-12-29 LAB — HEPATIC FUNCTION PANEL
ALT: 14 U/L (ref 14–54)
AST: 22 U/L (ref 15–41)
Albumin: 3.7 g/dL (ref 3.5–5.0)
Alkaline Phosphatase: 54 U/L (ref 38–126)
Bilirubin, Direct: 0.2 mg/dL (ref 0.1–0.5)
Indirect Bilirubin: 0.6 mg/dL (ref 0.3–0.9)
Total Bilirubin: 0.8 mg/dL (ref 0.3–1.2)
Total Protein: 7 g/dL (ref 6.5–8.1)

## 2015-12-29 LAB — LIPID PANEL
Cholesterol: 167 mg/dL (ref 0–200)
HDL: 50 mg/dL (ref >40)
LDL Cholesterol: 101 mg/dL — ABNORMAL HIGH (ref 0–99)
Total CHOL/HDL Ratio: 3.3 RATIO
Triglycerides: 81 mg/dL (ref <150)
VLDL: 16 mg/dL (ref 0–40)

## 2015-12-29 LAB — TSH: TSH: 0.862 u[IU]/mL (ref 0.350–4.500)

## 2015-12-29 LAB — MAGNESIUM: Magnesium: 2 mg/dL (ref 1.7–2.4)

## 2015-12-29 MED ORDER — TECHNETIUM TC 99M TETROFOSMIN IV KIT
30.0000 | PACK | Freq: Once | INTRAVENOUS | Status: AC | PRN
  Administered 2015-12-29: 30 via INTRAVENOUS

## 2015-12-29 MED ORDER — REGADENOSON 0.4 MG/5ML IV SOLN
INTRAVENOUS | Status: AC
  Filled 2015-12-29: qty 5

## 2015-12-29 MED ORDER — REGADENOSON 0.4 MG/5ML IV SOLN
0.4000 mg | Freq: Once | INTRAVENOUS | Status: AC
  Administered 2015-12-29: 0.4 mg via INTRAVENOUS

## 2015-12-29 MED ORDER — TECHNETIUM TC 99M TETROFOSMIN IV KIT
10.0000 | PACK | Freq: Once | INTRAVENOUS | Status: AC | PRN
  Administered 2015-12-29: 10 via INTRAVENOUS

## 2016-01-28 DIAGNOSIS — Z23 Encounter for immunization: Secondary | ICD-10-CM | POA: Diagnosis not present

## 2016-01-28 DIAGNOSIS — M1990 Unspecified osteoarthritis, unspecified site: Secondary | ICD-10-CM | POA: Diagnosis not present

## 2016-01-28 DIAGNOSIS — I10 Essential (primary) hypertension: Secondary | ICD-10-CM | POA: Diagnosis not present

## 2016-01-28 DIAGNOSIS — E782 Mixed hyperlipidemia: Secondary | ICD-10-CM | POA: Diagnosis not present

## 2016-02-18 DIAGNOSIS — Z853 Personal history of malignant neoplasm of breast: Secondary | ICD-10-CM | POA: Diagnosis not present

## 2016-03-01 DIAGNOSIS — E669 Obesity, unspecified: Secondary | ICD-10-CM | POA: Diagnosis not present

## 2016-03-01 DIAGNOSIS — I25118 Atherosclerotic heart disease of native coronary artery with other forms of angina pectoris: Secondary | ICD-10-CM | POA: Diagnosis not present

## 2016-03-01 DIAGNOSIS — F17290 Nicotine dependence, other tobacco product, uncomplicated: Secondary | ICD-10-CM | POA: Diagnosis not present

## 2016-03-01 DIAGNOSIS — E785 Hyperlipidemia, unspecified: Secondary | ICD-10-CM | POA: Diagnosis not present

## 2016-03-01 DIAGNOSIS — I10 Essential (primary) hypertension: Secondary | ICD-10-CM | POA: Diagnosis not present

## 2016-03-01 DIAGNOSIS — I38 Endocarditis, valve unspecified: Secondary | ICD-10-CM | POA: Diagnosis not present

## 2016-03-23 ENCOUNTER — Other Ambulatory Visit: Payer: Self-pay

## 2016-03-23 MED ORDER — ANASTROZOLE 1 MG PO TABS: 1.0000 mg | ORAL_TABLET | Freq: Every day | ORAL | 0 refills | Status: DC

## 2016-03-25 ENCOUNTER — Other Ambulatory Visit: Payer: Self-pay

## 2016-04-13 DIAGNOSIS — Z853 Personal history of malignant neoplasm of breast: Secondary | ICD-10-CM | POA: Diagnosis not present

## 2016-05-03 DIAGNOSIS — I38 Endocarditis, valve unspecified: Secondary | ICD-10-CM | POA: Diagnosis not present

## 2016-05-03 DIAGNOSIS — F17290 Nicotine dependence, other tobacco product, uncomplicated: Secondary | ICD-10-CM | POA: Diagnosis not present

## 2016-05-03 DIAGNOSIS — E785 Hyperlipidemia, unspecified: Secondary | ICD-10-CM | POA: Diagnosis not present

## 2016-05-03 DIAGNOSIS — I10 Essential (primary) hypertension: Secondary | ICD-10-CM | POA: Diagnosis not present

## 2016-05-03 DIAGNOSIS — I25118 Atherosclerotic heart disease of native coronary artery with other forms of angina pectoris: Secondary | ICD-10-CM | POA: Diagnosis not present

## 2016-05-03 DIAGNOSIS — I350 Nonrheumatic aortic (valve) stenosis: Secondary | ICD-10-CM | POA: Diagnosis not present

## 2016-05-03 DIAGNOSIS — E669 Obesity, unspecified: Secondary | ICD-10-CM | POA: Diagnosis not present

## 2016-05-10 ENCOUNTER — Ambulatory Visit: Payer: Commercial Managed Care - HMO | Admitting: Hematology and Oncology

## 2016-05-24 ENCOUNTER — Other Ambulatory Visit: Payer: Self-pay | Admitting: Hematology and Oncology

## 2016-06-02 NOTE — Assessment & Plan Note (Deleted)
Right breast lumpectomy 10/17/2014: 0.28 cm focus of residual IDC grade 2 with papillary features, 0/1 sentinel node, background of sclerosing lymphocytic lobulitis, fibrocystic changes, E 99%, PR 97%, HER-2 negative, Ki-67 7%, T1a N0 M0 stage IA S/P XRT 01/02/15, Started anastrozole August 2016  Anastrozole Toxicities:  1. Hot flashes with sweats especially with certain foods that precipitate 2. Mild stiffness in the hands  Breast tenderness:I encouraged her to get postlumpectomy bras from [facility]. Right breast lymphedema: Wrote a prescription for bras.  Breast Cancer Surveillance: 1. Breast exam 06/03/16 Mild right breast lymphedema but otherwise nodularity from the effects of surgery and radiation along the surgical scar. No palpable lymphadenopathy 2. Mammogram Annually Done at [facility].  RTC in 6 months and after that F/U in 1 year

## 2016-06-03 ENCOUNTER — Telehealth: Payer: Self-pay | Admitting: Hematology and Oncology

## 2016-06-03 ENCOUNTER — Ambulatory Visit: Payer: Commercial Managed Care - HMO | Admitting: Hematology and Oncology

## 2016-06-03 NOTE — Telephone Encounter (Signed)
Appointment rescheduled per patient request. Patient called to verify time of appointment, realize she missed it, rescheduled.

## 2016-06-03 NOTE — Progress Notes (Deleted)
Patient Care Team: Fanny Bien, MD as PCP - General (Family Medicine)  DIAGNOSIS:  Encounter Diagnosis  Name Primary?  . Malignant neoplasm of upper-outer quadrant of right breast in female, estrogen receptor positive ([place])     SUMMARY OF ONCOLOGIC HISTORY:   Breast cancer of upper-outer quadrant of right female breast ([place])   07/23/2014 Mammogram    Right breast (12 o'clock): New cluster of heterogenous calcs in right breast suspicious for malignancy. No other significant findings seen in right breast.      08/07/2014 Initial Biopsy    Right breast biopsy: Invasive ductal carcinoma with papillary features associated microcalcs. Grade 2. ER+ (99%), PR+ (97%), HER2- by FISH. Ki67 7%.       08/20/2014 Breast MRI    Right breast: 1.8 cm enhancement at 11:00. Left breast: 7 x 9 mm circumscribed oval mass at 9:00 stable from 2010-benign. No abnormal appearing lymph nodes.      10/17/2014 Surgery    Right breast lumpectomy with SLNB (Rosenbower): Grade 2, residual IDC with papillary features 0.28 cm. 0/1 right SLN. 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 ER+/PR+ HER-2 negative, Ki67 7%.      10/17/2014 Pathologic Stage     pT1a, pN0: Stage IA      11/28/2014 - 01/02/2015 Radiation Therapy    Adjuvant RT completed Pablo Ledger): Right breast / 50 Gray @ 2 [name]      01/2015 -  Anti-estrogen oral therapy    Anastrazole 80m daily. Planned duration of treatment: 5 years.       02/13/2015 Survivorship    Survivorship Care Plan given/reviewed with pt.        CHIEF COMPLIANT: Follow-up on anastrozole therapy  INTERVAL HISTORY: Carolyn JIMMERSONis a 75year old with above-mentioned history of right breast cancer treated with lumpectomy followed by adjuvant radiation therapy and is currently on oral anastrozole since July 2016. She is tolerating anastrozole extremely well. She complains of hot flashes especially when she  eats certain foods. She also has stiffness in the hands.  REVIEW OF SYSTEMS:   Constitutional: Denies fevers, chills or abnormal weight loss Eyes: Denies blurriness of vision Ears, nose, mouth, throat, and face: Denies mucositis or sore throat Respiratory: Denies cough, dyspnea or wheezes Cardiovascular: Denies palpitation, chest discomfort Gastrointestinal:  Denies nausea, heartburn or change in bowel habits Skin: Denies abnormal skin rashes Lymphatics: Denies new lymphadenopathy or easy bruising Neurological:Denies numbness, tingling or new weaknesses Behavioral/Psych: Mood is stable, no new changes  Extremities: No lower extremity edema Breast:  denies any pain or lumps or nodules in either breasts All other systems were reviewed with the patient and are negative.  I have reviewed the past medical history, past surgical history, social history and family history with the patient and they are unchanged from previous note.  ALLERGIES:  is allergic to penicillins.  MEDICATIONS:  Current Outpatient Prescriptions  Medication Sig Dispense Refill  . acetaminophen (TYLENOL) 650 MG CR tablet Take 1,300 mg by mouth every 8 (eight) hours as needed for pain.    .Marland Kitchenanastrozole (ARIMIDEX) 1 MG tablet TAKE 1 TABLET EVERY DAY 90 tablet 0  . aspirin 81 MG chewable tablet Chew 1 tablet (81 mg total) by mouth daily.    . CRESTOR 20 MG tablet Take 20 mg by mouth daily.     . cyclobenzaprine (FLEXERIL) 5 MG tablet Reported on 12/29/2015  1  . emollient (BIAFINE) cream Apply topically 2 (two)  times daily. Reported on 12/29/2015    . gabapentin (NEURONTIN) 300 MG capsule Take 1 capsule (300 mg total) by mouth at bedtime. 30 capsule 0  . ibuprofen (ADVIL,MOTRIN) 800 MG tablet Take 800 mg by mouth daily as needed.    . isosorbide mononitrate (IMDUR) 60 MG 24 hr tablet Take 1 tablet (60 mg total) by mouth daily. 30 tablet 1  . meclizine (ANTIVERT) 25 MG tablet Take 1 tablet (25 mg total) by mouth 3 (three)  times daily as needed for dizziness. 30 tablet 0  . metoprolol succinate (TOPROL-XL) 50 MG 24 hr tablet Take 50 mg by mouth daily. Take with or immediately following a meal.    . Multiple Vitamins-Minerals (MULTIVITAMIN WITH MINERALS) tablet Take 1 tablet by mouth daily.    . nitroGLYCERIN (NITROSTAT) 0.4 MG SL tablet Place 1 tablet (0.4 mg total) under the tongue every 5 (five) minutes as needed for chest pain. 30 tablet 1  . omeprazole (PRILOSEC) 40 MG capsule Take 40 mg by mouth daily.    Marland Kitchen spironolactone (ALDACTONE) 25 MG tablet Take 25 mg by mouth daily.    . traMADol (ULTRAM) 50 MG tablet Take 2 tablets (100 mg total) by mouth every 8 (eight) hours as needed. 30 tablet 0   No current facility-administered medications for this visit.     PHYSICAL EXAMINATION: ECOG PERFORMANCE STATUS: 1 - Symptomatic but completely ambulatory  There were no vitals filed for this visit. There were no vitals filed for this visit.  GENERAL:alert, no distress and comfortable SKIN: skin color, texture, turgor are normal, no rashes or significant lesions EYES: normal, Conjunctiva are pink and non-injected, sclera clear OROPHARYNX:no exudate, no erythema and lips, buccal mucosa, and tongue normal  NECK: supple, thyroid normal size, non-tender, without nodularity LYMPH:  no palpable lymphadenopathy in the cervical, axillary or inguinal LUNGS: clear to auscultation and percussion with normal breathing effort HEART: regular rate & rhythm and no murmurs and no lower extremity edema ABDOMEN:abdomen soft, non-tender and normal bowel sounds MUSCULOSKELETAL:no cyanosis of digits and no clubbing  NEURO: alert & oriented x 3 with fluent speech, no focal motor/sensory deficits EXTREMITIES: No lower extremity edema BREAST: No palpable masses or nodules in either right or left breasts. No palpable axillary supraclavicular or infraclavicular adenopathy no breast tenderness or nipple discharge. (exam performed in the  presence of a chaperone)  LABORATORY DATA:  I have reviewed the data as listed   Chemistry      Component Value Date/Time   NA 140 12/29/2015 1030   K 4.5 12/29/2015 1030   CL 106 12/29/2015 1030   CO2 26 12/29/2015 1030   BUN 12 12/29/2015 1030   CREATININE 0.80 12/29/2015 1030      Component Value Date/Time   CALCIUM 9.4 12/29/2015 1030   ALKPHOS 54 12/29/2015 1030   AST 22 12/29/2015 1030   ALT 14 12/29/2015 1030   BILITOT 0.8 12/29/2015 1030       Lab Results  Component Value Date   WBC 6.9 11/07/2014   HGB 13.0 11/07/2014   HCT 37.2 11/07/2014   MCV 78.6 11/07/2014   PLT 216 11/07/2014   NEUTROABS 3.6 11/07/2014    ASSESSMENT & PLAN:  Breast cancer of upper-outer quadrant of right female breast Right breast lumpectomy 10/17/2014: 0.28 cm focus of residual IDC grade 2 with papillary features, 0/1 sentinel node, background of sclerosing lymphocytic lobulitis, fibrocystic changes, E 99%, PR 97%, HER-2 negative, Ki-67 7%, T1a N0 M0 stage IA S/P XRT  01/02/15, Started anastrozole August 2016  Anastrozole Toxicities:  1. Hot flashes with sweats especially with certain foods that precipitate 2. Mild stiffness in the hands  Breast tenderness:I encouraged her to get postlumpectomy bras from [employer]. Right breast lymphedema: Wrote a prescription for bras.  Breast Cancer Surveillance: 1. Breast exam 06/03/16 Mild right breast lymphedema but otherwise nodularity from the effects of surgery and radiation along the surgical scar. No palpable lymphadenopathy 2. Mammogram Annually Done at [facility].  RTC in 6 months and after that F/U in 1 year    No orders of the defined types were placed in this encounter.  The patient has a good understanding of the overall plan. she agrees with it. she will call with any problems that may develop before the next visit here.   Rulon Eisenmenger, MD 06/03/16

## 2016-06-06 NOTE — Assessment & Plan Note (Signed)
Right breast lumpectomy 10/17/2014: 0.28 cm focus of residual IDC grade 2 with papillary features, 0/1 sentinel node, background of sclerosing lymphocytic lobulitis, fibrocystic changes, E 99%, PR 97%, HER-2 negative, Ki-67 7%, T1a N0 M0 stage IA S/P XRT 01/02/15, Started anastrozole August 2016  Anastrozole Toxicities:  1. Hot flashes with sweats especially with certain foods that precipitate 2. Mild stiffness in the hands  Breast tenderness:I encouraged her to get postlumpectomy bras from [employer]. Right breast lymphedema: Wrote a prescription for bras.  Breast Cancer Surveillance: 1. Breast exam 06/07/16: Mild right breast lymphedema but otherwise nodularity from the effects of surgery and radiation along the surgical scar. No palpable lymphadenopathy 2. Mammogram Annually Done at [facility].  RTC in 1 year

## 2016-06-07 ENCOUNTER — Encounter: Payer: Self-pay | Admitting: Hematology and Oncology

## 2016-06-07 ENCOUNTER — Ambulatory Visit (HOSPITAL_BASED_OUTPATIENT_CLINIC_OR_DEPARTMENT_OTHER): Payer: Commercial Managed Care - HMO | Admitting: Hematology and Oncology

## 2016-06-07 DIAGNOSIS — N951 Menopausal and female climacteric states: Secondary | ICD-10-CM

## 2016-06-07 DIAGNOSIS — Z79811 Long term (current) use of aromatase inhibitors: Secondary | ICD-10-CM

## 2016-06-07 DIAGNOSIS — M25641 Stiffness of right hand, not elsewhere classified: Secondary | ICD-10-CM | POA: Diagnosis not present

## 2016-06-07 DIAGNOSIS — Z170 Estrogen receptor positive status [ER+]: Secondary | ICD-10-CM

## 2016-06-07 DIAGNOSIS — C50411 Malignant neoplasm of upper-outer quadrant of right female breast: Secondary | ICD-10-CM

## 2016-06-07 DIAGNOSIS — M25642 Stiffness of left hand, not elsewhere classified: Secondary | ICD-10-CM

## 2016-06-07 NOTE — Progress Notes (Signed)
Patient Care Team: Fanny Bien, MD as PCP - General (Family Medicine)  DIAGNOSIS:  Encounter Diagnosis  Name Primary?  . Malignant neoplasm of upper-outer quadrant of right breast in female, estrogen receptor positive ([place])     SUMMARY OF ONCOLOGIC HISTORY:   Breast cancer of upper-outer quadrant of right female breast ([place])   07/23/2014 Mammogram    Right breast (12 o'clock): New cluster of heterogenous calcs in right breast suspicious for malignancy. No other significant findings seen in right breast.      08/07/2014 Initial Biopsy    Right breast biopsy: Invasive ductal carcinoma with papillary features associated microcalcs. Grade 2. ER+ (99%), PR+ (97%), HER2- by FISH. Ki67 7%.       08/20/2014 Breast MRI    Right breast: 1.8 cm enhancement at 11:00. Left breast: 7 x 9 mm circumscribed oval mass at 9:00 stable from 2010-benign. No abnormal appearing lymph nodes.      10/17/2014 Surgery    Right breast lumpectomy with SLNB (Rosenbower): Grade 2, residual IDC with papillary features 0.28 cm. 0/1 right SLN. 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 ER+/PR+ HER-2 negative, Ki67 7%.      10/17/2014 Pathologic Stage     pT1a, pN0: Stage IA      11/28/2014 - 01/02/2015 Radiation Therapy    Adjuvant RT completed Pablo Ledger): Right breast / 50 Gray @ 2 [name]      01/2015 -  Anti-estrogen oral therapy    Anastrazole 23m daily. Planned duration of treatment: 5 years.       02/13/2015 Survivorship    Survivorship Care Plan given/reviewed with pt.        CHIEF COMPLIANT: Follow-up on anastrozole  INTERVAL HISTORY: Carolyn KARWOWSKIis a 75year old with above-mentioned history of right breast cancer treated with lumpectomy followed by adjuvant radiation and is currently on adjuvant antiestrogen therapy with anastrozole. This started in July 2016. She appears to be tolerating it fairly well. She does have occasional  hot flashes or myalgias. Denies any new lumps or nodules in the breast. She continues to have chronic right breast lymphedema.  REVIEW OF SYSTEMS:   Constitutional: Denies fevers, chills or abnormal weight loss Eyes: Denies blurriness of vision Ears, nose, mouth, throat, and face: Denies mucositis or sore throat Respiratory: Denies cough, dyspnea or wheezes Cardiovascular: Denies palpitation, chest discomfort Gastrointestinal:  Denies nausea, heartburn or change in bowel habits Skin: Denies abnormal skin rashes Lymphatics: Denies new lymphadenopathy or easy bruising Neurological:Denies numbness, tingling or new weaknesses Behavioral/Psych: Mood is stable, no new changes  Extremities: No lower extremity edema Breast:  Right breast lymphedema All other systems were reviewed with the patient and are negative.  I have reviewed the past medical history, past surgical history, social history and family history with the patient and they are unchanged from previous note.  ALLERGIES:  is allergic to penicillins.  MEDICATIONS:  Current Outpatient Prescriptions  Medication Sig Dispense Refill  . anastrozole (ARIMIDEX) 1 MG tablet TAKE 1 TABLET EVERY DAY 90 tablet 0  . aspirin 81 MG chewable tablet Chew 1 tablet (81 mg total) by mouth daily.    . CRESTOR 20 MG tablet Take 20 mg by mouth daily.     .Marland Kitchenemollient (BIAFINE) cream Apply topically 2 (two) times daily. Reported on 12/29/2015    . gabapentin (NEURONTIN) 300 MG capsule Take 1 capsule (300 mg total) by mouth at bedtime. 30 capsule 0  . ibuprofen (  ADVIL,MOTRIN) 800 MG tablet Take 800 mg by mouth daily as needed.    . isosorbide mononitrate (IMDUR) 60 MG 24 hr tablet Take 1 tablet (60 mg total) by mouth daily. 30 tablet 1  . meclizine (ANTIVERT) 25 MG tablet Take 1 tablet (25 mg total) by mouth 3 (three) times daily as needed for dizziness. 30 tablet 0  . metoprolol succinate (TOPROL-XL) 50 MG 24 hr tablet Take 50 mg by mouth daily. Take with  or immediately following a meal.    . Multiple Vitamins-Minerals (MULTIVITAMIN WITH MINERALS) tablet Take 1 tablet by mouth daily.    . nitroGLYCERIN (NITROSTAT) 0.4 MG SL tablet Place 1 tablet (0.4 mg total) under the tongue every 5 (five) minutes as needed for chest pain. 30 tablet 1  . spironolactone (ALDACTONE) 25 MG tablet Take 25 mg by mouth daily.     No current facility-administered medications for this visit.     PHYSICAL EXAMINATION: ECOG PERFORMANCE STATUS: 1 - Symptomatic but completely ambulatory  Vitals:   06/07/16 0921  BP: 129/60  Pulse: 77  Resp: 18  Temp: 98.2 F (36.8 C)   Filed Weights   06/07/16 0921  Weight: 226 lb 8 oz (102.7 kg)    GENERAL:alert, no distress and comfortable SKIN: skin color, texture, turgor are normal, no rashes or significant lesions EYES: normal, Conjunctiva are pink and non-injected, sclera clear OROPHARYNX:no exudate, no erythema and lips, buccal mucosa, and tongue normal  NECK: supple, thyroid normal size, non-tender, without nodularity LYMPH:  no palpable lymphadenopathy in the cervical, axillary or inguinal LUNGS: clear to auscultation and percussion with normal breathing effort HEART: regular rate & rhythm and no murmurs and no lower extremity edema ABDOMEN:abdomen soft, non-tender and normal bowel sounds MUSCULOSKELETAL:no cyanosis of digits and no clubbing  NEURO: alert & oriented x 3 with fluent speech, no focal motor/sensory deficits EXTREMITIES: No lower extremity edema BREAST: Right breast lymphedema present. No palpable masses or nodules in either right or left breasts. No palpable axillary supraclavicular or infraclavicular adenopathy no breast tenderness or nipple discharge. (exam performed in the presence of a chaperone)  LABORATORY DATA:  I have reviewed the data as listed   Chemistry      Component Value Date/Time   NA 140 12/29/2015 1030   K 4.5 12/29/2015 1030   CL 106 12/29/2015 1030   CO2 26 12/29/2015 1030    BUN 12 12/29/2015 1030   CREATININE 0.80 12/29/2015 1030      Component Value Date/Time   CALCIUM 9.4 12/29/2015 1030   ALKPHOS 54 12/29/2015 1030   AST 22 12/29/2015 1030   ALT 14 12/29/2015 1030   BILITOT 0.8 12/29/2015 1030       Lab Results  Component Value Date   WBC 6.9 11/07/2014   HGB 13.0 11/07/2014   HCT 37.2 11/07/2014   MCV 78.6 11/07/2014   PLT 216 11/07/2014   NEUTROABS 3.6 11/07/2014    ASSESSMENT & PLAN:  Breast cancer of upper-outer quadrant of right female breast Right breast lumpectomy 10/17/2014: 0.28 cm focus of residual IDC grade 2 with papillary features, 0/1 sentinel node, background of sclerosing lymphocytic lobulitis, fibrocystic changes, E 99%, PR 97%, HER-2 negative, Ki-67 7%, T1a N0 M0 stage IA S/P XRT 01/02/15, Started anastrozole August 2016  Anastrozole Toxicities:  1. Hot flashes with sweats especially with certain foods that precipitate 2. Mild stiffness in the hands  Breast tenderness:I encouraged her to get postlumpectomy bras from [employer]. Right breast lymphedema: Wrote  a prescription for bras.  Breast Cancer Surveillance: 1. Breast exam 06/07/16: Mild right breast lymphedema but otherwise nodularity from the effects of surgery and radiation along the surgical scar. No palpable lymphadenopathy 2. Mammogram Annually Done at [facility] October 2017.  RTC in 1 year   No orders of the defined types were placed in this encounter.  The patient has a good understanding of the overall plan. she agrees with it. she will call with any problems that may develop before the next visit here.   Rulon Eisenmenger, MD 06/07/16

## 2016-06-15 DIAGNOSIS — M48062 Spinal stenosis, lumbar region with neurogenic claudication: Secondary | ICD-10-CM | POA: Diagnosis not present

## 2016-06-15 DIAGNOSIS — M5416 Radiculopathy, lumbar region: Secondary | ICD-10-CM | POA: Diagnosis not present

## 2016-06-23 DIAGNOSIS — Z6836 Body mass index (BMI) 36.0-36.9, adult: Secondary | ICD-10-CM | POA: Diagnosis not present

## 2016-06-23 DIAGNOSIS — M5416 Radiculopathy, lumbar region: Secondary | ICD-10-CM | POA: Diagnosis not present

## 2016-06-23 DIAGNOSIS — M48062 Spinal stenosis, lumbar region with neurogenic claudication: Secondary | ICD-10-CM | POA: Diagnosis not present

## 2016-07-02 DIAGNOSIS — Z0100 Encounter for examination of eyes and vision without abnormal findings: Secondary | ICD-10-CM | POA: Diagnosis not present

## 2016-07-02 DIAGNOSIS — H524 Presbyopia: Secondary | ICD-10-CM | POA: Diagnosis not present

## 2016-08-04 DIAGNOSIS — M48062 Spinal stenosis, lumbar region with neurogenic claudication: Secondary | ICD-10-CM | POA: Diagnosis not present

## 2016-08-04 DIAGNOSIS — M5416 Radiculopathy, lumbar region: Secondary | ICD-10-CM | POA: Diagnosis not present

## 2016-08-09 DIAGNOSIS — I25118 Atherosclerotic heart disease of native coronary artery with other forms of angina pectoris: Secondary | ICD-10-CM | POA: Diagnosis not present

## 2016-08-09 DIAGNOSIS — E785 Hyperlipidemia, unspecified: Secondary | ICD-10-CM | POA: Diagnosis not present

## 2016-08-09 DIAGNOSIS — I10 Essential (primary) hypertension: Secondary | ICD-10-CM | POA: Diagnosis not present

## 2016-08-09 DIAGNOSIS — R69 Illness, unspecified: Secondary | ICD-10-CM | POA: Diagnosis not present

## 2016-08-09 DIAGNOSIS — I350 Nonrheumatic aortic (valve) stenosis: Secondary | ICD-10-CM | POA: Diagnosis not present

## 2016-08-19 ENCOUNTER — Emergency Department (HOSPITAL_COMMUNITY): Payer: Medicare HMO

## 2016-08-19 ENCOUNTER — Emergency Department (HOSPITAL_COMMUNITY)
Admission: EM | Admit: 2016-08-19 | Discharge: 2016-08-19 | Disposition: A | Discharge status: Home / Self Care | Payer: Medicare HMO | Attending: Emergency Medicine | Admitting: Emergency Medicine

## 2016-08-19 ENCOUNTER — Encounter (HOSPITAL_COMMUNITY): Payer: Self-pay

## 2016-08-19 DIAGNOSIS — Z955 Presence of coronary angioplasty implant and graft: Secondary | ICD-10-CM | POA: Diagnosis not present

## 2016-08-19 DIAGNOSIS — Z79899 Other long term (current) drug therapy: Secondary | ICD-10-CM | POA: Insufficient documentation

## 2016-08-19 DIAGNOSIS — Y939 Activity, unspecified: Secondary | ICD-10-CM | POA: Diagnosis not present

## 2016-08-19 DIAGNOSIS — S299XXA Unspecified injury of thorax, initial encounter: Secondary | ICD-10-CM | POA: Diagnosis present

## 2016-08-19 DIAGNOSIS — Y92410 Unspecified street and highway as the place of occurrence of the external cause: Secondary | ICD-10-CM | POA: Insufficient documentation

## 2016-08-19 DIAGNOSIS — Z87891 Personal history of nicotine dependence: Secondary | ICD-10-CM | POA: Insufficient documentation

## 2016-08-19 DIAGNOSIS — R0789 Other chest pain: Secondary | ICD-10-CM

## 2016-08-19 DIAGNOSIS — Z7982 Long term (current) use of aspirin: Secondary | ICD-10-CM | POA: Diagnosis not present

## 2016-08-19 DIAGNOSIS — Z853 Personal history of malignant neoplasm of breast: Secondary | ICD-10-CM | POA: Diagnosis not present

## 2016-08-19 DIAGNOSIS — R079 Chest pain, unspecified: Secondary | ICD-10-CM | POA: Diagnosis not present

## 2016-08-19 DIAGNOSIS — I10 Essential (primary) hypertension: Secondary | ICD-10-CM | POA: Insufficient documentation

## 2016-08-19 DIAGNOSIS — Y999 Unspecified external cause status: Secondary | ICD-10-CM | POA: Insufficient documentation

## 2016-08-19 LAB — I-STAT TROPONIN, ED: Troponin i, poc: 0 ng/mL (ref 0.00–0.08)

## 2016-08-19 MED ORDER — MORPHINE SULFATE (PF) 4 MG/ML IV SOLN: 4.0000 mg | Freq: Once | INTRAVENOUS | Status: DC

## 2016-08-19 MED ORDER — HYDROCODONE-ACETAMINOPHEN 5-325 MG PO TABS
1.0000 | ORAL_TABLET | Freq: Once | ORAL | Status: AC
  Administered 2016-08-19: 1 via ORAL
  Filled 2016-08-19: qty 1

## 2016-08-19 MED ORDER — HYDROCODONE-ACETAMINOPHEN 5-325 MG PO TABS: 0.5000 | ORAL_TABLET | ORAL | 0 refills | Status: DC | PRN

## 2016-08-19 NOTE — ED Notes (Signed)
EKG given to Dr. Goldston  

## 2016-08-19 NOTE — ED Notes (Signed)
Ambulated pt to restroom, no issues. Pt back on monitor.
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
## 2016-08-19 NOTE — ED Notes (Signed)
Pt given [place] sandwich and ginger ale per Audrey(RN)

## 2016-08-19 NOTE — Discharge Instructions (Signed)
Your x-ray shows no signs of fracture. Have given you an incentive spirometer. Please use as directed every 1-2 hours while awake. Take the pain medicine as needed. Apply ice to chest. Follow-up with her primary care doctor if her symptoms not improving. Return to the ED for chest pain worsens, he developed worsening shortness of breath or for any reason.

## 2016-08-19 NOTE — ED Notes (Addendum)
Pt states that nurse gave her vicodin, and now pt has vertigo or feels like she has vertigo. Pt ask if the pt can take medicine for vertigo. Notified Audrey(RN)

## 2016-08-23 DIAGNOSIS — S20211A Contusion of right front wall of thorax, initial encounter: Secondary | ICD-10-CM | POA: Diagnosis not present

## 2016-08-23 DIAGNOSIS — S40012S Contusion of left shoulder, sequela: Secondary | ICD-10-CM | POA: Diagnosis not present

## 2016-08-23 DIAGNOSIS — M549 Dorsalgia, unspecified: Secondary | ICD-10-CM | POA: Diagnosis not present

## 2016-08-23 DIAGNOSIS — Z6838 Body mass index (BMI) 38.0-38.9, adult: Secondary | ICD-10-CM | POA: Diagnosis not present

## 2016-10-07 DIAGNOSIS — M791 Myalgia: Secondary | ICD-10-CM | POA: Diagnosis not present

## 2016-10-07 DIAGNOSIS — E785 Hyperlipidemia, unspecified: Secondary | ICD-10-CM | POA: Diagnosis not present

## 2016-10-07 DIAGNOSIS — I10 Essential (primary) hypertension: Secondary | ICD-10-CM | POA: Diagnosis not present

## 2016-10-07 DIAGNOSIS — I25118 Atherosclerotic heart disease of native coronary artery with other forms of angina pectoris: Secondary | ICD-10-CM | POA: Diagnosis not present

## 2016-10-07 DIAGNOSIS — R0789 Other chest pain: Secondary | ICD-10-CM | POA: Diagnosis not present

## 2016-10-07 DIAGNOSIS — I350 Nonrheumatic aortic (valve) stenosis: Secondary | ICD-10-CM | POA: Diagnosis not present

## 2016-10-12 DIAGNOSIS — R922 Inconclusive mammogram: Secondary | ICD-10-CM | POA: Diagnosis not present

## 2016-10-12 DIAGNOSIS — Z853 Personal history of malignant neoplasm of breast: Secondary | ICD-10-CM | POA: Diagnosis not present

## 2016-10-13 DIAGNOSIS — M1711 Unilateral primary osteoarthritis, right knee: Secondary | ICD-10-CM | POA: Diagnosis not present

## 2016-10-13 DIAGNOSIS — M791 Myalgia: Secondary | ICD-10-CM | POA: Diagnosis not present

## 2016-10-13 DIAGNOSIS — M549 Dorsalgia, unspecified: Secondary | ICD-10-CM | POA: Diagnosis not present

## 2016-10-13 DIAGNOSIS — S20212S Contusion of left front wall of thorax, sequela: Secondary | ICD-10-CM | POA: Diagnosis not present

## 2016-10-14 ENCOUNTER — Ambulatory Visit
Admission: RE | Admit: 2016-10-14 | Discharge: 2016-10-14 | Disposition: A | Discharge status: Home / Self Care | Payer: Medicare HMO | Source: Ambulatory Visit | Attending: Family Medicine | Admitting: Family Medicine

## 2016-10-14 ENCOUNTER — Other Ambulatory Visit: Payer: Self-pay | Admitting: Family Medicine

## 2016-10-14 DIAGNOSIS — M170 Bilateral primary osteoarthritis of knee: Secondary | ICD-10-CM

## 2016-10-14 DIAGNOSIS — M1712 Unilateral primary osteoarthritis, left knee: Secondary | ICD-10-CM | POA: Diagnosis not present

## 2016-10-14 DIAGNOSIS — M25461 Effusion, right knee: Secondary | ICD-10-CM | POA: Diagnosis not present

## 2016-11-06 DIAGNOSIS — I350 Nonrheumatic aortic (valve) stenosis: Secondary | ICD-10-CM | POA: Diagnosis not present

## 2016-11-06 DIAGNOSIS — R0789 Other chest pain: Secondary | ICD-10-CM | POA: Diagnosis not present

## 2016-11-06 DIAGNOSIS — E785 Hyperlipidemia, unspecified: Secondary | ICD-10-CM | POA: Diagnosis not present

## 2016-11-06 DIAGNOSIS — I25118 Atherosclerotic heart disease of native coronary artery with other forms of angina pectoris: Secondary | ICD-10-CM | POA: Diagnosis not present

## 2016-11-06 DIAGNOSIS — I10 Essential (primary) hypertension: Secondary | ICD-10-CM | POA: Diagnosis not present

## 2016-11-15 ENCOUNTER — Other Ambulatory Visit: Payer: Self-pay | Admitting: Hematology and Oncology

## 2016-11-15 DIAGNOSIS — M1990 Unspecified osteoarthritis, unspecified site: Secondary | ICD-10-CM | POA: Diagnosis not present

## 2016-11-15 DIAGNOSIS — Z6839 Body mass index (BMI) 39.0-39.9, adult: Secondary | ICD-10-CM | POA: Diagnosis not present

## 2016-11-15 DIAGNOSIS — M170 Bilateral primary osteoarthritis of knee: Secondary | ICD-10-CM | POA: Diagnosis not present

## 2016-11-23 DIAGNOSIS — R42 Dizziness and giddiness: Secondary | ICD-10-CM | POA: Diagnosis not present

## 2016-11-23 DIAGNOSIS — Z79891 Long term (current) use of opiate analgesic: Secondary | ICD-10-CM | POA: Diagnosis not present

## 2016-11-23 DIAGNOSIS — Z0000 Encounter for general adult medical examination without abnormal findings: Secondary | ICD-10-CM | POA: Diagnosis not present

## 2016-11-23 DIAGNOSIS — Z973 Presence of spectacles and contact lenses: Secondary | ICD-10-CM | POA: Diagnosis not present

## 2016-11-23 DIAGNOSIS — R079 Chest pain, unspecified: Secondary | ICD-10-CM | POA: Diagnosis not present

## 2016-11-23 DIAGNOSIS — M1711 Unilateral primary osteoarthritis, right knee: Secondary | ICD-10-CM | POA: Diagnosis not present

## 2016-11-23 DIAGNOSIS — M79651 Pain in right thigh: Secondary | ICD-10-CM | POA: Diagnosis not present

## 2016-11-23 DIAGNOSIS — R3915 Urgency of urination: Secondary | ICD-10-CM | POA: Diagnosis not present

## 2016-11-23 DIAGNOSIS — Z9011 Acquired absence of right breast and nipple: Secondary | ICD-10-CM | POA: Diagnosis not present

## 2016-11-23 DIAGNOSIS — C50911 Malignant neoplasm of unspecified site of right female breast: Secondary | ICD-10-CM | POA: Diagnosis not present

## 2016-11-23 DIAGNOSIS — Z955 Presence of coronary angioplasty implant and graft: Secondary | ICD-10-CM | POA: Diagnosis not present

## 2016-11-23 DIAGNOSIS — R002 Palpitations: Secondary | ICD-10-CM | POA: Diagnosis not present

## 2016-11-23 DIAGNOSIS — Z79899 Other long term (current) drug therapy: Secondary | ICD-10-CM | POA: Diagnosis not present

## 2016-11-23 DIAGNOSIS — I10 Essential (primary) hypertension: Secondary | ICD-10-CM | POA: Diagnosis not present

## 2016-11-23 DIAGNOSIS — Z87891 Personal history of nicotine dependence: Secondary | ICD-10-CM | POA: Diagnosis not present

## 2016-11-23 DIAGNOSIS — E785 Hyperlipidemia, unspecified: Secondary | ICD-10-CM | POA: Diagnosis not present

## 2016-12-14 DIAGNOSIS — Z6838 Body mass index (BMI) 38.0-38.9, adult: Secondary | ICD-10-CM | POA: Diagnosis not present

## 2016-12-14 DIAGNOSIS — M1711 Unilateral primary osteoarthritis, right knee: Secondary | ICD-10-CM | POA: Diagnosis not present

## 2016-12-14 DIAGNOSIS — W64XXXA Exposure to other animate mechanical forces, initial encounter: Secondary | ICD-10-CM | POA: Diagnosis not present

## 2016-12-14 DIAGNOSIS — M1990 Unspecified osteoarthritis, unspecified site: Secondary | ICD-10-CM | POA: Diagnosis not present

## 2016-12-14 DIAGNOSIS — T148XXA Other injury of unspecified body region, initial encounter: Secondary | ICD-10-CM | POA: Diagnosis not present

## 2016-12-17 ENCOUNTER — Other Ambulatory Visit: Payer: Self-pay | Admitting: Cardiology

## 2016-12-17 DIAGNOSIS — E669 Obesity, unspecified: Secondary | ICD-10-CM | POA: Diagnosis not present

## 2016-12-17 DIAGNOSIS — E785 Hyperlipidemia, unspecified: Secondary | ICD-10-CM | POA: Diagnosis not present

## 2016-12-17 DIAGNOSIS — R079 Chest pain, unspecified: Secondary | ICD-10-CM

## 2016-12-17 DIAGNOSIS — I10 Essential (primary) hypertension: Secondary | ICD-10-CM | POA: Diagnosis not present

## 2016-12-17 DIAGNOSIS — I25119 Atherosclerotic heart disease of native coronary artery with unspecified angina pectoris: Secondary | ICD-10-CM | POA: Diagnosis not present

## 2016-12-17 DIAGNOSIS — I350 Nonrheumatic aortic (valve) stenosis: Secondary | ICD-10-CM | POA: Diagnosis not present

## 2016-12-17 DIAGNOSIS — R69 Illness, unspecified: Secondary | ICD-10-CM | POA: Diagnosis not present

## 2017-01-07 ENCOUNTER — Ambulatory Visit (HOSPITAL_COMMUNITY)
Admission: RE | Admit: 2017-01-07 | Discharge: 2017-01-07 | Disposition: A | Discharge status: Home / Self Care | Payer: Medicare HMO | Source: Ambulatory Visit | Attending: Cardiology | Admitting: Cardiology

## 2017-01-07 DIAGNOSIS — I350 Nonrheumatic aortic (valve) stenosis: Secondary | ICD-10-CM | POA: Diagnosis not present

## 2017-01-07 DIAGNOSIS — R69 Illness, unspecified: Secondary | ICD-10-CM | POA: Diagnosis not present

## 2017-01-07 DIAGNOSIS — R079 Chest pain, unspecified: Secondary | ICD-10-CM | POA: Insufficient documentation

## 2017-01-07 DIAGNOSIS — I25119 Atherosclerotic heart disease of native coronary artery with unspecified angina pectoris: Secondary | ICD-10-CM | POA: Diagnosis not present

## 2017-01-07 DIAGNOSIS — E785 Hyperlipidemia, unspecified: Secondary | ICD-10-CM | POA: Diagnosis not present

## 2017-01-07 DIAGNOSIS — I10 Essential (primary) hypertension: Secondary | ICD-10-CM | POA: Diagnosis not present

## 2017-01-07 MED ORDER — REGADENOSON 0.4 MG/5ML IV SOLN
0.4000 mg | Freq: Once | INTRAVENOUS | Status: AC
  Administered 2017-01-07: 0.4 mg via INTRAVENOUS

## 2017-01-07 MED ORDER — REGADENOSON 0.4 MG/5ML IV SOLN
INTRAVENOUS | Status: AC
  Filled 2017-01-07: qty 5

## 2017-01-07 MED ORDER — TECHNETIUM TC 99M TETROFOSMIN IV KIT
10.0000 | PACK | Freq: Once | INTRAVENOUS | Status: AC | PRN
  Administered 2017-01-07: 10 via INTRAVENOUS

## 2017-01-07 MED ORDER — TECHNETIUM TC 99M TETROFOSMIN IV KIT
30.0000 | PACK | Freq: Once | INTRAVENOUS | Status: AC | PRN
  Administered 2017-01-07: 30 via INTRAVENOUS

## 2017-01-19 DIAGNOSIS — E782 Mixed hyperlipidemia: Secondary | ICD-10-CM | POA: Diagnosis not present

## 2017-01-19 DIAGNOSIS — I2510 Atherosclerotic heart disease of native coronary artery without angina pectoris: Secondary | ICD-10-CM | POA: Diagnosis not present

## 2017-01-19 DIAGNOSIS — R011 Cardiac murmur, unspecified: Secondary | ICD-10-CM | POA: Diagnosis not present

## 2017-01-19 DIAGNOSIS — I10 Essential (primary) hypertension: Secondary | ICD-10-CM | POA: Diagnosis not present

## 2017-03-16 ENCOUNTER — Ambulatory Visit (INDEPENDENT_AMBULATORY_CARE_PROVIDER_SITE_OTHER): Payer: Medicare HMO

## 2017-03-16 ENCOUNTER — Ambulatory Visit (INDEPENDENT_AMBULATORY_CARE_PROVIDER_SITE_OTHER): Payer: Medicare HMO | Admitting: Orthopaedic Surgery

## 2017-03-16 ENCOUNTER — Encounter (INDEPENDENT_AMBULATORY_CARE_PROVIDER_SITE_OTHER): Payer: Self-pay | Admitting: Orthopaedic Surgery

## 2017-03-16 VITALS — BP 130/72 | HR 69 | Ht 66.0 in | Wt 225.0 lb

## 2017-03-16 DIAGNOSIS — G8929 Other chronic pain: Secondary | ICD-10-CM

## 2017-03-16 DIAGNOSIS — M545 Low back pain: Secondary | ICD-10-CM | POA: Diagnosis not present

## 2017-03-16 DIAGNOSIS — M1611 Unilateral primary osteoarthritis, right hip: Secondary | ICD-10-CM | POA: Diagnosis not present

## 2017-03-16 NOTE — Addendum Note (Signed)
Addended by: Meyer Cory on: 03/16/2017 11:25 AM   Modules accepted: Orders

## 2017-03-16 NOTE — Progress Notes (Signed)
Office Visit Note   Patient: Carolyn Rowland           Date of Birth: [birth year]           MRN: [id number] Visit Date: 03/16/2017              Requested by: Fanny Bien, [address] [place], [place] [address] PCP: Fanny Bien, MD   Assessment & Plan: Visit Diagnoses:  1. Chronic right-sided low back pain, with sciatica presence unspecified   2. Unilateral primary osteoarthritis, right hip     Plan: She has 2 problems one is lumbar stenosis with anterolisthesis which has progressed. The other is hip osteoarthritis which is progressed since 2015. We'll set her up for an intra-articular injection of her hips she does with this and will see her back in 3 weeks.  Follow-Up Instructions: No Follow-up on file.   Orders:  Orders Placed This Encounter  Procedures  . XR Lumbar Spine 2-3 Views  . XR Pelvis 1-2 Views   No orders of the defined types were placed in this encounter.     Procedures: No procedures performed   Clinical Data: No additional findings.   Subjective: Chief Complaint  Patient presents with  . Right Leg - Pain    HPI 76 year old female seen with some back pain but primarily right groin pain right proximal thigh pain. This got worse in the last year. She had bilateral knee injections but really has not gotten any improvement with this. She has known degenerative anterolisthesis grade 1/2-2 at L4-5 with stenosis and large disc herniation noted on previous MRI several years ago. She states when she walks she has primarily right groin pain that radiates to the mid thigh which is associated with a limp. She denies chills or fever about bladder symptoms.  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   Objective: Vital Signs: BP 130/72   Pulse 69   Ht 5\' 6"  (1.676 m)   Wt 225 lb (102.1 kg)   BMI  36.32 kg/m   Physical Exam  Constitutional: She is oriented to person, place, and time. She appears well-developed.  HENT:  Head: Normocephalic.  Right Ear: External ear normal.  Left Ear: External ear normal.  Eyes: Pupils are equal, round, and reactive to light.  Neck: No tracheal deviation present. No thyromegaly present.  Cardiovascular: Normal rate.   Pulmonary/Chest: Effort normal.  Abdominal: Soft.  Musculoskeletal:  Patient has mild crepitus both knees. She has 0 internal rotation right hip with extreme pain only 20 external rotation. Opposite left hip has 30 internal/external rotation without hip pain. Pain was resisted hip flexion she points directly to the groin. Significant pain extreme sore range of motion right hip. Negative straight leg raising 90. Anterior tib EHL is intact.  Neurological: She is alert and oriented to person, place, and time.  Skin: Skin is warm and dry.  Psychiatric: She has a normal mood and affect. Her behavior is normal.    Ortho Exam  Specialty Comments:  No specialty comments available.  Imaging: Xr Pelvis 1-2 Views  Result Date: 03/16/2017 AP pelvis including hip joints demonstrate some hip joint narrowing worse on the right and left hip. Negative for acute fracture. Impression: Right hip joint space narrowing consistent with osteoarthritis. This has progressed since 2015 x-rays.    PMFS History: Patient Active Problem List  Diagnosis Date Noted  . Unilateral primary osteoarthritis, right hip 03/16/2017  . CAP (community acquired pneumonia) 09/04/2014  . Leukocytosis 09/04/2014  . Hypokalemia 09/04/2014  . Breast cancer of upper-outer quadrant of right female breast ([place]) 08/27/2014  . Coronary atherosclerosis of native coronary artery 08/18/2013  . S/P PTCA (percutaneous transluminal coronary angioplasty) 08/17/2013   Past Medical History:  Diagnosis Date  . Aortic stenosis    mild to moderate AS 07/2013  . Arthritis     "hands; legs" (08/17/2013)  . CAP (community acquired pneumonia)   . GERD (gastroesophageal reflux disease)   . H/O hiatal hernia   . Heart murmur   . High cholesterol   . Hypertension   . Pneumonia 1970's   "once"  . Radiation 11/28/14-01/02/15   Right breast 50 Gray  . Vertigo     Family History  Problem Relation Age of Onset  . Cancer Brother   . Cancer Brother   . Heart attack Brother     Past Surgical History:  Procedure Laterality Date  . ABDOMINAL HYSTERECTOMY  1985  . APPENDECTOMY  1960  . BREAST LUMPECTOMY WITH NEEDLE LOCALIZATION AND AXILLARY SENTINEL LYMPH NODE BX Right 10/17/2014   Procedure: BREAST LUMPECTOMY WITH NEEDLE LOCALIZATION AND AXILLARY SENTINEL LYMPH NODE BX;  Surgeon: Jackolyn Confer, MD;  Location: [facility];  Service: General;  Laterality: Right;  . CARDIAC CATHETERIZATION  08/07/2013  . CORONARY ANGIOPLASTY WITH STENT PLACEMENT  08/17/2013   "6 stents"(08/17/2013)  . LEFT AND RIGHT HEART CATHETERIZATION WITH CORONARY ANGIOGRAM N/A 08/07/2013   Procedure: LEFT AND RIGHT HEART CATHETERIZATION WITH CORONARY ANGIOGRAM;  Surgeon: Laverda Page, MD;  Location: [facility] CATH LAB;  Service: Cardiovascular;  Laterality: N/A;  . PERCUTANEOUS CORONARY STENT INTERVENTION (PCI-S) N/A 08/17/2013   Procedure: PERCUTANEOUS CORONARY STENT INTERVENTION (PCI-S);  Surgeon: Laverda Page, MD;  Location[facility] C[facility] LAB;  Service: Cardiovascular;  Laterality: N/A;  . TUBAL LIGATION  1980   Social History   Occupational History  . Not on file.   Social History Main Topics  . Smoking status: Former Smoker    Packs/day: 1.00    Years: 52.00    Types: Cigarettes    Quit date: 04/04/2014  . Smokeless tobacco: Never Used  . Alcohol use No     Comment: 08/17/2013 "quit drinking at age 87; never did drink much"  . Drug use: No  . Sexual activity: No

## 2017-03-21 DIAGNOSIS — Z0000 Encounter for general adult medical examination without abnormal findings: Secondary | ICD-10-CM | POA: Diagnosis not present

## 2017-03-21 DIAGNOSIS — E782 Mixed hyperlipidemia: Secondary | ICD-10-CM | POA: Diagnosis not present

## 2017-03-25 DIAGNOSIS — Z6838 Body mass index (BMI) 38.0-38.9, adult: Secondary | ICD-10-CM | POA: Diagnosis not present

## 2017-03-25 DIAGNOSIS — Z23 Encounter for immunization: Secondary | ICD-10-CM | POA: Diagnosis not present

## 2017-03-25 DIAGNOSIS — Z1211 Encounter for screening for malignant neoplasm of colon: Secondary | ICD-10-CM | POA: Diagnosis not present

## 2017-03-25 DIAGNOSIS — Z0000 Encounter for general adult medical examination without abnormal findings: Secondary | ICD-10-CM | POA: Diagnosis not present

## 2017-03-29 ENCOUNTER — Encounter (INDEPENDENT_AMBULATORY_CARE_PROVIDER_SITE_OTHER): Payer: Self-pay | Admitting: Physical Medicine and Rehabilitation

## 2017-03-29 ENCOUNTER — Ambulatory Visit (INDEPENDENT_AMBULATORY_CARE_PROVIDER_SITE_OTHER): Payer: Medicare HMO

## 2017-03-29 ENCOUNTER — Ambulatory Visit (INDEPENDENT_AMBULATORY_CARE_PROVIDER_SITE_OTHER): Payer: Medicare HMO | Admitting: Physical Medicine and Rehabilitation

## 2017-03-29 DIAGNOSIS — M25551 Pain in right hip: Secondary | ICD-10-CM | POA: Diagnosis not present

## 2017-03-29 NOTE — Progress Notes (Signed)
Carolyn Rowland - 76 y.o. female MRN [id number]  Date of birth: 1940/07/31  Office Visit Note: Visit Date: 03/29/2017 PCP: Fanny Bien, MD Referred by: Fanny Bien, MD  Subjective: Chief Complaint  Patient presents with  . Right Hip - Pain   HPI: Carolyn Rowland is a very pleasant 76 year old female followed by Dr. Lorin Mercy. She has right hip and groin pain and this is been ongoing for several months and worsening. It does refer down to the knee. She feels like her thighs swollen. She has difficulty getting moving after sitting for a while. She denies any paresthesias or weakness. She has had x-rays which are reviewed below.    ROS Otherwise per HPI.  Assessment & Plan: Visit Diagnoses:  1. Pain in right hip     Plan: Findings:  Diagnostic and therapeutic anesthetic hip arthrogram on the right. Patient did have relief during the anesthetic phase of the injection was quite happy.    Meds & Orders: No orders of the defined types were placed in this encounter.   Orders Placed This Encounter  Procedures  . Large Joint Injection/Arthrocentesis  . XR C-ARM NO REPORT    Follow-up: Return for Dr. Lorin Mercy.   Procedures: Large Joint Inj Date/Time: 03/29/2017 2:39 PM Performed by: Magnus Sinning Authorized by: Magnus Sinning   Consent Given by:  Patient Site marked: the procedure site was marked   Timeout: prior to procedure the correct patient, procedure, and site was verified   Indications:  Pain and diagnostic evaluation Location:  Hip Site:  R hip joint Prep: patient was prepped and draped in usual sterile fashion   Needle Size:  22 G Approach:  Anterior Ultrasound Guidance: No   Fluoroscopic Guidance: No   Arthrogram: Yes   Medications:  80 mg triamcinolone acetonide 40 MG/ML; 3 mL bupivacaine 0.5 % Aspiration Attempted: Yes   Patient tolerance:  Patient tolerated the procedure well with no immediate complications  Arthrogram demonstrated excellent flow of  contrast throughout the joint surface without extravasation or obvious defect.  The patient had relief of symptoms during the anesthetic phase of the injection.     No notes on file   Clinical History: 03/16/17 AP pelvis including hip joints demonstrate some hip joint narrowing worse  on the right and left hip. Negative for acute fracture.  Impression: Right hip joint space narrowing consistent with  osteoarthritis. This has progressed since 2015 x-rays.  She reports that she quit smoking about 2 years ago. Her smoking use included Cigarettes. She has a 52.00 pack-year smoking history. She has never used smokeless tobacco. No results for input(s): HGBA1C, LABURIC in the last 8760 hours.  Objective:  VS:  HT:    WT:   BMI:     BP:   HR: bpm  TEMP: ( )  RESP:  Physical Exam  Musculoskeletal:  Painful range of motion of the right hip. Good distal strength.    Ortho Exam Imaging: Xr C-arm No Report  Result Date: 03/29/2017 Please see Notes or Procedures tab for imaging impression.   Past Medical/Family/Surgical/Social History: Medications & Allergies reviewed per EMR Patient Active Problem List   Diagnosis Date Noted  . Unilateral primary osteoarthritis, right hip 03/16/2017  . CAP (community acquired pneumonia) 09/04/2014  . Leukocytosis 09/04/2014  . Hypokalemia 09/04/2014  . Breast cancer of upper-outer quadrant of right female breast ([place]) 08/27/2014  . Coronary atherosclerosis of native coronary artery 08/18/2013  . S/P PTCA (percutaneous transluminal coronary angioplasty) 08/17/2013  Past Medical History:  Diagnosis Date  . Aortic stenosis    mild to moderate AS 07/2013  . Arthritis    "hands; legs" (08/17/2013)  . CAP (community acquired pneumonia)   . GERD (gastroesophageal reflux disease)   . H/O hiatal hernia   . Heart murmur   . High cholesterol   . Hypertension   . Pneumonia 1970's   "once"  . Radiation 11/28/14-01/02/15   Right breast 50 Gray  .  Vertigo    Family History  Problem Relation Age of Onset  . Cancer Brother   . Cancer Brother   . Heart attack Brother    Past Surgical History:  Procedure Laterality Date  . ABDOMINAL HYSTERECTOMY  1985  . APPENDECTOMY  1960  . BREAST LUMPECTOMY WITH NEEDLE LOCALIZATION AND AXILLARY SENTINEL LYMPH NODE BX Right 10/17/2014   Procedure: BREAST LUMPECTOMY WITH NEEDLE LOCALIZATION AND AXILLARY SENTINEL LYMPH NODE BX;  Surgeon: Jackolyn Confer, MD;  Location: [facility];  Service: General;  Laterality: Right;  . CARDIAC CATHETERIZATION  08/07/2013  . CORONARY ANGIOPLASTY WITH STENT PLACEMENT  08/17/2013   "6 stents"(08/17/2013)  . LEFT AND RIGHT HEART CATHETERIZATION WITH CORONARY ANGIOGRAM N/A 08/07/2013   Procedure: LEFT AND RIGHT HEART CATHETERIZATION WITH CORONARY ANGIOGRAM;  Surgeon: Laverda Page, MD;  Location: [facility] CATH LAB;  Service: Cardiovascular;  Laterality: N/A;  . PERCUTANEOUS CORONARY STENT INTERVENTION (PCI-S) N/A 08/17/2013   Procedure: PERCUTANEOUS CORONARY STENT INTERVENTION (PCI-S);  Surgeon: Laverda Page, MD;  Location: [facility] CATH LAB;  Service: Cardiovascular;  Laterality: N/A;  . TUBAL LIGATION  1980   Social History   Occupational History  . Not on file.   Social History Main Topics  . Smoking status: Former Smoker    Packs/day: 1.00    Years: 52.00    Types: Cigarettes    Quit date: 04/04/2014  . Smokeless tobacco: Never Used  . Alcohol use No     Comment: 08/17/2013 "quit drinking at age 40; never did drink much"  . Drug use: No  . Sexual activity: No

## 2017-03-29 NOTE — Patient Instructions (Signed)

## 2017-03-29 NOTE — Progress Notes (Deleted)
Righjt hip and groin pain for several months. Radiates to knee. Says thigh feels like it is swollen. Difficulty getting moving after sitting long periods.

## 2017-03-30 MED ORDER — BUPIVACAINE HCL 0.5 % IJ SOLN
3.0000 mL | INTRAMUSCULAR | Status: AC | PRN
  Administered 2017-03-29: 3 mL via INTRA_ARTICULAR

## 2017-03-30 MED ORDER — TRIAMCINOLONE ACETONIDE 40 MG/ML IJ SUSP
80.0000 mg | INTRAMUSCULAR | Status: AC | PRN
  Administered 2017-03-29: 80 mg via INTRA_ARTICULAR

## 2017-04-06 ENCOUNTER — Ambulatory Visit (INDEPENDENT_AMBULATORY_CARE_PROVIDER_SITE_OTHER): Payer: Medicare HMO | Admitting: Orthopaedic Surgery

## 2017-04-06 ENCOUNTER — Encounter (INDEPENDENT_AMBULATORY_CARE_PROVIDER_SITE_OTHER): Payer: Self-pay | Admitting: Orthopaedic Surgery

## 2017-04-06 VITALS — BP 125/74 | HR 68 | Ht 66.0 in | Wt 224.0 lb

## 2017-04-06 DIAGNOSIS — M1611 Unilateral primary osteoarthritis, right hip: Secondary | ICD-10-CM

## 2017-04-06 NOTE — Progress Notes (Signed)
Office Visit Note   Patient: Carolyn Rowland           Date of Birth: 04-21-1941           MRN: [id number] Visit Date: 04/06/2017              Requested by: Fanny Bien, [address] [place], [place] [address] PCP: Fanny Bien, MD   Assessment & Plan: Visit Diagnoses:  1. Unilateral primary osteoarthritis, right hip     Plan: She is very happy with the results from the intra-articular injection of her right hip. We'll check her back again in 2 months. We discussed total hip arthroplasty option if she has persistent symptoms.  Follow-Up Instructions: Return in about 2 months (around 06/06/2017).   Orders:  No orders of the defined types were placed in this encounter.  No orders of the defined types were placed in this encounter.     Procedures: No procedures performed   Clinical Data: No additional findings.   Subjective: Chief Complaint  Patient presents with  . Right Hip - Pain, Follow-up    HPI she returns post right hip injection 03/29/2017 under fluoroscopy. She states she's gotten probably 98% pain relief. She is walking better still walks a little bit slow she still has some back pain and she only has the very small amount of left groin pain. She still has mild right hip limp with ambulation but is moving better and has gotten good pain relief. She's only had 2 ibuprofen since last visit.  Review of Systems review of systems is updated and is unchanged from last office visit. She has grade 1/2-2 spondylolisthesis at L4-5. Bilateral hip osteoarthritis. Coronary artery disease multiple stents in the past. History of right breast cancer.   Objective: Vital Signs: BP 125/74   Pulse 68   Ht 5\' 6"  (1.676 m)   Wt 224 lb (101.6 kg)   BMI 36.15 kg/m   Physical Exam  Constitutional: She is oriented to person, place, and time. She appears well-developed.  HENT:  Head: Normocephalic.  Right Ear: External ear normal.  Left Ear: External ear  normal.  Eyes: Pupils are equal, round, and reactive to light.  Neck: No tracheal deviation present. No thyromegaly present.  Cardiovascular: Normal rate.   Pulmonary/Chest: Effort normal.  Abdominal: Soft.  Neurological: She is alert and oriented to person, place, and time.  Skin: Skin is warm and dry.  Psychiatric: She has a normal mood and affect. Her behavior is normal.    Ortho Exam patient has 20 internal rotation right hip which is minimally painful. She cannot figure 4 she is immature with the mild Trendelenburg gait on the right. Opposite left hip internally rotates 30 with minimal discomfort. Some discomfort straight leg raising positive compression. Distal pulses are intact. Specialty Comments:  No specialty comments available.  Imaging: No results found.   PMFS History: Patient Active Problem List   Diagnosis Date Noted  . Unilateral primary osteoarthritis, right hip 03/16/2017  . CAP (community acquired pneumonia) 09/04/2014  . Leukocytosis 09/04/2014  . Hypokalemia 09/04/2014  . Breast cancer of upper-outer quadrant of right female breast ([place]) 08/27/2014  . Coronary atherosclerosis of native coronary artery 08/18/2013  . S/P PTCA (percutaneous transluminal coronary angioplasty) 08/17/2013   Past Medical History:  Diagnosis Date  . Aortic stenosis    mild to moderate AS 07/2013  . Arthritis    "hands; legs" (08/17/2013)  . CAP (community acquired  pneumonia)   . GERD (gastroesophageal reflux disease)   . H/O hiatal hernia   . Heart murmur   . High cholesterol   . Hypertension   . Pneumonia 1970's   "once"  . Radiation 11/28/14-01/02/15   Right breast 50 Gray  . Vertigo     Family History  Problem Relation Age of Onset  . Cancer Brother   . Cancer Brother   . Heart attack Brother     Past Surgical History:  Procedure Laterality Date  . ABDOMINAL HYSTERECTOMY  1985  . APPENDECTOMY  1960  . BREAST LUMPECTOMY WITH NEEDLE LOCALIZATION AND AXILLARY  SENTINEL LYMPH NODE BX Right 10/17/2014   Procedure: BREAST LUMPECTOMY WITH NEEDLE LOCALIZATION AND AXILLARY SENTINEL LYMPH NODE BX;  Surgeon: Jackolyn Confer, MD;  Location: [place];  Service: General;  Laterality: Right;  . CARDIAC CATHETERIZATION  08/07/2013  . CORONARY ANGIOPLASTY WITH STENT PLACEMENT  08/17/2013   "6 stents"(08/17/2013)  . LEFT AND RIGHT HEART CATHETERIZATION WITH CORONARY ANGIOGRAM N/A 08/07/2013   Procedure: LEFT AND RIGHT HEART CATHETERIZATION WITH CORONARY ANGIOGRAM;  Surgeon: Laverda Page, MD;  Location: [facility] CATH LAB;  Service: Cardiovascular;  Laterality: N/A;  . PERCUTANEOUS CORONARY STENT INTERVENTION (PCI-S) N/A 08/17/2013   Procedure: PERCUTANEOUS CORONARY STENT INTERVENTION (PCI-S);  Surgeon: Laverda Page, MD;  Location: [facility] CATH LAB;  Service: Cardiovascular;  Laterality: N/A;  . TUBAL LIGATION  1980   Social History   Occupational History  . Not on file.   Social History Main Topics  . Smoking status: Former Smoker    Packs/day: 1.00    Years: 52.00    Types: Cigarettes    Quit date: 04/04/2014  . Smokeless tobacco: Never Used  . Alcohol use No     Comment: 08/17/2013 "quit drinking at age 89; never did drink much"  . Drug use: No  . Sexual activity: No

## 2017-04-19 DIAGNOSIS — E785 Hyperlipidemia, unspecified: Secondary | ICD-10-CM | POA: Diagnosis not present

## 2017-04-19 DIAGNOSIS — I10 Essential (primary) hypertension: Secondary | ICD-10-CM | POA: Diagnosis not present

## 2017-04-19 DIAGNOSIS — R69 Illness, unspecified: Secondary | ICD-10-CM | POA: Diagnosis not present

## 2017-04-19 DIAGNOSIS — I25118 Atherosclerotic heart disease of native coronary artery with other forms of angina pectoris: Secondary | ICD-10-CM | POA: Diagnosis not present

## 2017-04-19 DIAGNOSIS — I350 Nonrheumatic aortic (valve) stenosis: Secondary | ICD-10-CM | POA: Diagnosis not present

## 2017-05-09 DIAGNOSIS — M5136 Other intervertebral disc degeneration, lumbar region: Secondary | ICD-10-CM | POA: Diagnosis not present

## 2017-05-09 DIAGNOSIS — M5416 Radiculopathy, lumbar region: Secondary | ICD-10-CM | POA: Diagnosis not present

## 2017-05-24 ENCOUNTER — Encounter (INDEPENDENT_AMBULATORY_CARE_PROVIDER_SITE_OTHER): Payer: Self-pay | Admitting: Orthopaedic Surgery

## 2017-05-24 ENCOUNTER — Ambulatory Visit (INDEPENDENT_AMBULATORY_CARE_PROVIDER_SITE_OTHER): Payer: Medicare HMO | Admitting: Orthopaedic Surgery

## 2017-05-24 VITALS — BP 129/88 | HR 75 | Ht 66.0 in | Wt 225.0 lb

## 2017-05-24 DIAGNOSIS — M1611 Unilateral primary osteoarthritis, right hip: Secondary | ICD-10-CM | POA: Diagnosis not present

## 2017-05-24 NOTE — Progress Notes (Signed)
Office Visit Note   Patient: Carolyn Rowland           Date of Birth: 01/11/1941           MRN: [id number] Visit Date: 05/24/2017              Requested by: Fanny Bien, [address] [place], [place] [address] PCP: Fanny Bien, MD   Assessment & Plan: Visit Diagnoses:  1. Unilateral primary osteoarthritis, right hip     Plan: We discussed  Her grade 1.5 anterolisthesis at L4-5 as well as her bilateral hip osteoarthritis worse on the right than left..  I plan to check her back in 3 months for follow-up.  If her symptoms worsen prior to then she can return earlier.  Follow-Up Instructions: Return in about 3 months (around 08/24/2017).   Orders:  No orders of the defined types were placed in this encounter.  No orders of the defined types were placed in this encounter.     Procedures: No procedures performed   Clinical Data: No additional findings.   Subjective: Chief Complaint  Patient presents with  . Lower Back - Pain  . Left Leg - Pain    HPI 76 year old female returns and reports complete relief of her right groin pain after the hip injection 03/29/2017.  Left groin that radiates down toward her knee particularly after prolonged weightbearing.  X-rays showed worse hip osteoarthritis on the right than left.  He does have spondylolisthesis at L4-5 facet degeneration.  Better than she had several months ago before her hip injection.  Review of Systems 14 pt ROS  systems updated unchanged from  03/16/2017 note . She has Hx of  breast cancer and also coronary artery disease  And PAD.    Objective: Vital Signs: BP 129/88   Pulse 75   Ht 5\' 6"  (1.676 m)   Wt 225 lb (102.1 kg)   BMI 36.32 kg/m   Physical Exam  Constitutional: She is oriented to person, place, and time. She appears well-developed.  HENT:  Head: Normocephalic.  Right Ear: External ear normal.  Left Ear: External ear normal.  Eyes: Pupils are equal, round, and reactive to  light.  Neck: No tracheal deviation present. No thyromegaly present.  Cardiovascular: Normal rate.  Pulmonary/Chest: Effort normal.  Abdominal: Soft.  Neurological: She is alert and oriented to person, place, and time.  Skin: Skin is warm and dry.  Psychiatric: She has a normal mood and affect. Her behavior is normal.    Ortho Exam bilateral knee crepitus without knee effusion.  A 0 degrees internal rotation right hip which causes some pain 20 degrees external rotation.  Left hip has 30 degrees internal rotation reproducing her groin pain that radiates down toward her knee at 40 degrees.Marland Kitchen  Specialty Comments:  No specialty comments available.  Imaging: No results found.   PMFS History: Patient Active Problem List   Diagnosis Date Noted  . Unilateral primary osteoarthritis, right hip 03/16/2017  . CAP (community acquired pneumonia) 09/04/2014  . Leukocytosis 09/04/2014  . Hypokalemia 09/04/2014  . Breast cancer of upper-outer quadrant of right female breast ([place]) 08/27/2014  . Coronary atherosclerosis of native coronary artery 08/18/2013  . S/P PTCA (percutaneous transluminal coronary angioplasty) 08/17/2013   Past Medical History:  Diagnosis Date  . Aortic stenosis    mild to moderate AS 07/2013  . Arthritis    "hands; legs" (08/17/2013)  . CAP (community acquired pneumonia)   .  GERD (gastroesophageal reflux disease)   . H/O hiatal hernia   . Heart murmur   . High cholesterol   . Hypertension   . Pneumonia 1970's   "once"  . Radiation 11/28/14-01/02/15   Right breast 50 Gray  . Vertigo     Family History  Problem Relation Age of Onset  . Cancer Brother   . Cancer Brother   . Heart attack Brother     Past Surgical History:  Procedure Laterality Date  . ABDOMINAL HYSTERECTOMY  1985  . APPENDECTOMY  1960  . BREAST LUMPECTOMY WITH NEEDLE LOCALIZATION AND AXILLARY SENTINEL LYMPH NODE BX Right 10/17/2014   Procedure: BREAST LUMPECTOMY WITH NEEDLE LOCALIZATION AND  AXILLARY SENTINEL LYMPH NODE BX;  Surgeon: Jackolyn Confer, MD;  Location: [place];  Service: General;  Laterality: Right;  . CARDIAC CATHETERIZATION  08/07/2013  . CORONARY ANGIOPLASTY WITH STENT PLACEMENT  08/17/2013   "6 stents"(08/17/2013)  . LEFT AND RIGHT HEART CATHETERIZATION WITH CORONARY ANGIOGRAM N/A 08/07/2013   Procedure: LEFT AND RIGHT HEART CATHETERIZATION WITH CORONARY ANGIOGRAM;  Surgeon: Laverda Page, MD;  Location: [facility] CATH LAB;  Service: Cardiovascular;  Laterality: N/A;  . PERCUTANEOUS CORONARY STENT INTERVENTION (PCI-S) N/A 08/17/2013   Procedure: PERCUTANEOUS CORONARY STENT INTERVENTION (PCI-S);  Surgeon: Laverda Page, MD;  Location: [facility] CATH LAB;  Service: Cardiovascular;  Laterality: N/A;  . TUBAL LIGATION  1980   Social History   Occupational History  . Not on file  Tobacco Use  . Smoking status: Former Smoker    Packs/day: 1.00    Years: 52.00    Pack years: 52.00    Types: Cigarettes    Last attempt to quit: 04/04/2014    Years since quitting: 3.1  . Smokeless tobacco: Never Used  Substance and Sexual Activity  . Alcohol use: No    Comment: 08/17/2013 "quit drinking at age 16; never did drink much"  . Drug use: No  . Sexual activity: No

## 2017-05-30 ENCOUNTER — Encounter (INDEPENDENT_AMBULATORY_CARE_PROVIDER_SITE_OTHER): Payer: Self-pay | Admitting: Orthopaedic Surgery

## 2017-06-07 ENCOUNTER — Ambulatory Visit (HOSPITAL_BASED_OUTPATIENT_CLINIC_OR_DEPARTMENT_OTHER): Payer: Medicare HMO | Admitting: Hematology and Oncology

## 2017-06-07 VITALS — BP 130/62 | HR 73 | Temp 98.5°F | Resp 17 | Ht 66.0 in | Wt 218.3 lb

## 2017-06-07 DIAGNOSIS — M4800 Spinal stenosis, site unspecified: Secondary | ICD-10-CM

## 2017-06-07 DIAGNOSIS — Z780 Asymptomatic menopausal state: Secondary | ICD-10-CM

## 2017-06-07 DIAGNOSIS — Z79811 Long term (current) use of aromatase inhibitors: Secondary | ICD-10-CM

## 2017-06-07 DIAGNOSIS — M25642 Stiffness of left hand, not elsewhere classified: Secondary | ICD-10-CM | POA: Diagnosis not present

## 2017-06-07 DIAGNOSIS — Z170 Estrogen receptor positive status [ER+]: Secondary | ICD-10-CM | POA: Diagnosis not present

## 2017-06-07 DIAGNOSIS — C50411 Malignant neoplasm of upper-outer quadrant of right female breast: Secondary | ICD-10-CM

## 2017-06-07 DIAGNOSIS — I890 Lymphedema, not elsewhere classified: Secondary | ICD-10-CM | POA: Diagnosis not present

## 2017-06-07 DIAGNOSIS — M25641 Stiffness of right hand, not elsewhere classified: Secondary | ICD-10-CM

## 2017-06-07 MED ORDER — ANASTROZOLE 1 MG PO TABS: 1.0000 mg | ORAL_TABLET | Freq: Every day | ORAL | 3 refills | Status: DC

## 2017-06-07 NOTE — Progress Notes (Signed)
Patient Care Team: Fanny Bien, MD as PCP - General (Family Medicine)  DIAGNOSIS:  Encounter Diagnoses  Name Primary?  . Malignant neoplasm of upper-outer quadrant of right breast in female, estrogen receptor positive ([place])   . Post-menopausal Yes    SUMMARY OF ONCOLOGIC HISTORY:   Breast cancer of upper-outer quadrant of right female breast ([place])   07/23/2014 Mammogram    Right breast (12 o'clock): New cluster of heterogenous calcs in right breast suspicious for malignancy. No other significant findings seen in right breast.      08/07/2014 Initial Biopsy    Right breast biopsy: Invasive ductal carcinoma with papillary features associated microcalcs. Grade 2. ER+ (99%), PR+ (97%), HER2- by FISH. Ki67 7%.       08/20/2014 Breast MRI    Right breast: 1.8 cm enhancement at 11:00. Left breast: 7 x 9 mm circumscribed oval mass at 9:00 stable from 2010-benign. No abnormal appearing lymph nodes.      10/17/2014 Surgery    Right breast lumpectomy with SLNB ([facility]): Grade 2, residual IDC with papillary features 0.28 cm. 0/1 right SLN. 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 ER+/PR+ HER-2 negative, Ki67 7%.      10/17/2014 Pathologic Stage     pT1a, pN0: Stage IA      11/28/2014 - 01/02/2015 Radiation Therapy    Adjuvant RT completed Pablo [facility]): Right breast / 50 Gray @ 2 [name]      01/2015 -  Anti-estrogen oral therapy    Anastrazole '1mg'$  daily. Planned duration of treatment: 5 years.       02/13/2015 Survivorship    Survivorship Care Plan given/reviewed with pt.        CHIEF COMPLIANT: Follow-up on anastrozole therapy  INTERVAL HISTORY: Carolyn Rowland is a 76 year old with above-mentioned history of right breast cancer treated with lumpectomy and radiation is currently on anastrozole.  She is tolerating extremely well.  Very occasional hot flashes.  She has spinal stenosis which causes some difficulties  with lower extremities.  She had a recent injection which has helped her significantly.  REVIEW OF SYSTEMS:   Constitutional: Denies fevers, chills or abnormal weight loss Eyes: Denies blurriness of vision Ears, nose, mouth, throat, and face: Denies mucositis or sore throat Respiratory: Denies cough, dyspnea or wheezes Cardiovascular: Denies palpitation, chest discomfort Gastrointestinal:  Denies nausea, heartburn or change in bowel habits Skin: Denies abnormal skin rashes Lymphatics: Denies new lymphadenopathy or easy bruising Neurological:Denies numbness, tingling or new weaknesses Behavioral/Psych: Mood is stable, no new changes  Extremities: No lower extremity edema Breast:  denies any pain or lumps or nodules in either breasts All other systems were reviewed with the patient and are negative.  I have reviewed the past medical history, past surgical history, social history and family history with the patient and they are unchanged from previous note.  ALLERGIES:  is allergic to penicillins.  MEDICATIONS:  Current Outpatient Medications  Medication Sig Dispense Refill  . amLODipine (NORVASC) 2.5 MG tablet Take 2.5 mg by mouth daily.    Marland Kitchen anastrozole (ARIMIDEX) 1 MG tablet Take 1 tablet (1 mg total) by mouth daily. 90 tablet 3  . aspirin 81 MG chewable tablet Chew 1 tablet (81 mg total) by mouth daily.    . Calcium Carbonate-Vitamin D (CALCIUM-VITAMIN D3) 600-125 MG-UNIT TABS Take 1 tablet by mouth daily.    . CRESTOR 20 MG tablet Take 20 mg by mouth daily.     Marland Kitchen  emollient (BIAFINE) cream Apply topically 2 (two) times daily. Reported on 12/29/2015    . gabapentin (NEURONTIN) 300 MG capsule Take 1 capsule (300 mg total) by mouth at bedtime. (Patient not taking: Reported on 08/19/2016) 30 capsule 0  . HYDROcodone-acetaminophen (NORCO/VICODIN) 5-325 MG tablet Take 0.5 tablets by mouth every 4 (four) hours as needed. (Patient not taking: Reported on 01/07/2017) 10 tablet 0  . ibuprofen  (ADVIL,MOTRIN) 800 MG tablet Take 800 mg by mouth daily as needed.    . isosorbide mononitrate (IMDUR) 60 MG 24 hr tablet Take 1 tablet (60 mg total) by mouth daily. 30 tablet 1  . meclizine (ANTIVERT) 25 MG tablet Take 1 tablet (25 mg total) by mouth 3 (three) times daily as needed for dizziness. (Patient not taking: Reported on 03/16/2017) 30 tablet 0  . metoprolol succinate (TOPROL-XL) 50 MG 24 hr tablet Take 50 mg by mouth daily. Take with or immediately following a meal.    . Multiple Vitamins-Minerals (MULTIVITAMIN WITH MINERALS) tablet Take 1 tablet by mouth daily.    . nitroGLYCERIN (NITROSTAT) 0.4 MG SL tablet Place 1 tablet (0.4 mg total) under the tongue every 5 (five) minutes as needed for chest pain. 30 tablet 1  . spironolactone (ALDACTONE) 25 MG tablet Take 25 mg by mouth daily.     No current facility-administered medications for this visit.     PHYSICAL EXAMINATION: ECOG PERFORMANCE STATUS: 1 - Symptomatic but completely ambulatory  Vitals:   06/07/17 0911  BP: 130/62  Pulse: 73  Resp: 17  Temp: 98.5 F (36.9 C)  SpO2: 100%   Filed Weights   06/07/17 0911  Weight: 218 lb 4.8 oz (99 kg)    GENERAL:alert, no distress and comfortable SKIN: skin color, texture, turgor are normal, no rashes or significant lesions EYES: normal, Conjunctiva are pink and non-injected, sclera clear OROPHARYNX:no exudate, no erythema and lips, buccal mucosa, and tongue normal  NECK: supple, thyroid normal size, non-tender, without nodularity LYMPH:  no palpable lymphadenopathy in the cervical, axillary or inguinal LUNGS: clear to auscultation and percussion with normal breathing effort HEART: regular rate & rhythm and no murmurs and no lower extremity edema ABDOMEN:abdomen soft, non-tender and normal bowel sounds MUSCULOSKELETAL:no cyanosis of digits and no clubbing  NEURO: alert & oriented x 3 with fluent speech, no focal motor/sensory deficits EXTREMITIES: No lower extremity  edema BREAST: No palpable masses or nodules in either right or left breasts.  Scar tissue at the site of surgery in the right breast.  No palpable axillary supraclavicular or infraclavicular adenopathy no breast tenderness or nipple discharge. (exam performed in the presence of a chaperone)  LABORATORY DATA:  I have reviewed the data as listed   Chemistry      Component Value Date/Time   NA 140 12/29/2015 1030   K 4.5 12/29/2015 1030   CL 106 12/29/2015 1030   CO2 26 12/29/2015 1030   BUN 12 12/29/2015 1030   CREATININE 0.80 12/29/2015 1030      Component Value Date/Time   CALCIUM 9.4 12/29/2015 1030   ALKPHOS 54 12/29/2015 1030   AST 22 12/29/2015 1030   ALT 14 12/29/2015 1030   BILITOT 0.8 12/29/2015 1030       Lab Results  Component Value Date   WBC 6.9 11/07/2014   HGB 13.0 11/07/2014   HCT 37.2 11/07/2014   MCV 78.6 11/07/2014   PLT 216 11/07/2014   NEUTROABS 3.6 11/07/2014    ASSESSMENT & PLAN:  Breast cancer of upper-outer  quadrant of right female breast Right breast lumpectomy 10/17/2014: 0.28 cm focus of residual IDC grade 2 with papillary features, 0/1 sentinel node, background of sclerosing lymphocytic lobulitis, fibrocystic changes, E 99%, PR 97%, HER-2 negative, Ki-67 7%, T1a N0 M0 stage IA S/P XRT 01/02/15, Started anastrozole August 2016  Anastrozole Toxicities:  1. Hot flashes with sweats especially with certain foods that precipitate 2. Mild stiffness in the hands  Breast tenderness:I encouraged her to get postlumpectomy bras from [employer]. Right breast lymphedema: Wrote a prescription for bras.  Breast Cancer Surveillance: 1. Breast exam  06/07/2017: Mild right breast lymphedema but otherwise nodularity from the effects of surgery and radiation along the surgical scar. No palpable lymphadenopathy 2. Mammogram and bone density will be ordered for March 2019 at [facility].  RTC in 1 year  I spent 25 minutes talking to the patient of which more  than half was spent in counseling and coordination of care.  Orders Placed This Encounter  Procedures  . DG Bone Density    Standing Status:   Future    Standing Expiration Date:   07/12/2018    Scheduling Instructions:     Schedule it at same time as mammograms    Order Specific Question:   Reason for exam:    Answer:   Post menopausal    Order Specific Question:   Preferred imaging location?    Answer:   External    Comments:   [facility]   The patient has a good understanding of the overall plan. she agrees with it. she will call with any problems that may develop before the next visit here.   Rulon Eisenmenger, MD 06/07/17

## 2017-06-07 NOTE — Assessment & Plan Note (Signed)
Right breast lumpectomy 10/17/2014: 0.28 cm focus of residual IDC grade 2 with papillary features, 0/1 sentinel node, background of sclerosing lymphocytic lobulitis, fibrocystic changes, E 99%, PR 97%, HER-2 negative, Ki-67 7%, T1a N0 M0 stage IA S/P XRT 01/02/15, Started anastrozole August 2016  Anastrozole Toxicities:  1. Hot flashes with sweats especially with certain foods that precipitate 2. Mild stiffness in the hands  Breast tenderness:I encouraged her to get postlumpectomy bras from [employer]. Right breast lymphedema: Wrote a prescription for bras.  Breast Cancer Surveillance: 1. Breast exam  06/07/2017: Mild right breast lymphedema but otherwise nodularity from the effects of surgery and radiation along the surgical scar. No palpable lymphadenopathy 2. Mammogram Annually Done at [facility] Done October 2018.  RTC in 1 year   

## 2017-06-08 ENCOUNTER — Ambulatory Visit (INDEPENDENT_AMBULATORY_CARE_PROVIDER_SITE_OTHER): Payer: Medicare HMO | Admitting: Orthopaedic Surgery

## 2017-06-09 ENCOUNTER — Other Ambulatory Visit: Payer: Self-pay

## 2017-06-09 ENCOUNTER — Telehealth: Payer: Self-pay | Admitting: Hematology and Oncology

## 2017-06-09 DIAGNOSIS — C50411 Malignant neoplasm of upper-outer quadrant of right female breast: Secondary | ICD-10-CM

## 2017-06-09 DIAGNOSIS — Z170 Estrogen receptor positive status [ER+]: Secondary | ICD-10-CM

## 2017-06-09 NOTE — Telephone Encounter (Signed)
Spoke to patient regarding upcoming December 2019 appointments. Patient is scheduled for Bone density and Mammo 10/18/16

## 2017-07-19 DIAGNOSIS — I25118 Atherosclerotic heart disease of native coronary artery with other forms of angina pectoris: Secondary | ICD-10-CM | POA: Diagnosis not present

## 2017-07-19 DIAGNOSIS — E785 Hyperlipidemia, unspecified: Secondary | ICD-10-CM | POA: Diagnosis not present

## 2017-07-19 DIAGNOSIS — R69 Illness, unspecified: Secondary | ICD-10-CM | POA: Diagnosis not present

## 2017-07-19 DIAGNOSIS — I350 Nonrheumatic aortic (valve) stenosis: Secondary | ICD-10-CM | POA: Diagnosis not present

## 2017-07-19 DIAGNOSIS — I10 Essential (primary) hypertension: Secondary | ICD-10-CM | POA: Diagnosis not present

## 2017-08-24 DIAGNOSIS — E785 Hyperlipidemia, unspecified: Secondary | ICD-10-CM | POA: Diagnosis not present

## 2017-08-25 DIAGNOSIS — N390 Urinary tract infection, site not specified: Secondary | ICD-10-CM | POA: Diagnosis not present

## 2017-09-07 ENCOUNTER — Telehealth (INDEPENDENT_AMBULATORY_CARE_PROVIDER_SITE_OTHER): Payer: Self-pay | Admitting: Orthopaedic Surgery

## 2017-09-07 MED ORDER — ACETAMINOPHEN-CODEINE #3 300-30 MG PO TABS: 1.0000 | ORAL_TABLET | Freq: Three times a day (TID) | ORAL | 0 refills | Status: DC | PRN

## 2017-09-07 NOTE — Telephone Encounter (Signed)
Please advise 

## 2017-09-07 NOTE — Telephone Encounter (Signed)
[place] for tylenol # 3    One po tid # 30 . thanks

## 2017-09-07 NOTE — Addendum Note (Signed)
Addended by: Meyer Cory on: 09/07/2017 03:42 PM   Modules accepted: Orders

## 2017-09-07 NOTE — Telephone Encounter (Signed)
Patient called stating that she is in really intense pain in both her legs and was wondering if she could be prescribed something for the pain til her next appointment on the 20th. CB # [phone]

## 2017-09-07 NOTE — Telephone Encounter (Signed)
Called patient to advise. She wanted medication called to [facility] [address] [facility] because [facility] computer is down and the power is out. Med called to [facility].

## 2017-09-21 ENCOUNTER — Encounter (INDEPENDENT_AMBULATORY_CARE_PROVIDER_SITE_OTHER): Payer: Self-pay | Admitting: Orthopaedic Surgery

## 2017-09-21 ENCOUNTER — Ambulatory Visit (INDEPENDENT_AMBULATORY_CARE_PROVIDER_SITE_OTHER): Payer: Medicare HMO | Admitting: Orthopaedic Surgery

## 2017-09-21 VITALS — BP 144/70 | HR 73 | Ht 66.0 in | Wt 217.0 lb

## 2017-09-21 DIAGNOSIS — M4807 Spinal stenosis, lumbosacral region: Secondary | ICD-10-CM

## 2017-09-21 DIAGNOSIS — M160 Bilateral primary osteoarthritis of hip: Secondary | ICD-10-CM

## 2017-09-22 ENCOUNTER — Encounter (INDEPENDENT_AMBULATORY_CARE_PROVIDER_SITE_OTHER): Payer: Self-pay | Admitting: Orthopaedic Surgery

## 2017-09-22 DIAGNOSIS — M160 Bilateral primary osteoarthritis of hip: Secondary | ICD-10-CM | POA: Insufficient documentation

## 2017-09-22 DIAGNOSIS — M4807 Spinal stenosis, lumbosacral region: Secondary | ICD-10-CM | POA: Insufficient documentation

## 2017-09-22 NOTE — Progress Notes (Signed)
Office Visit Note   Patient: Carolyn Rowland           Date of Birth: 01/17/1941           MRN: [id number] Visit Date: 09/21/2017              Requested by: Fanny Bien, [address] [place], [place] [address] PCP: Fanny Bien, MD   Assessment & Plan: Visit Diagnoses:  1. Bilateral primary osteoarthritis of hip   2. Spinal stenosis of lumbosacral region     Plan: Patient is having severe problems ambulating.  His right greater than left hip osteoarthritis.  She also has symptoms of spinal stenosis grade 2 spondylolisthesis from degenerative facets at the L4-5 level.  We will proceed with MRI scan of the lumbar spine and see her back make a determination about whether right total hip arthroplasty versus lumbar surgery is needed to help with her pain.  Surgery is considered she need cardiac clearance preoperatively.  Follow-Up Instructions: No follow-ups on file.   Orders:  Orders Placed This Encounter  Procedures  . MR Lumbar Spine w/o contrast   No orders of the defined types were placed in this encounter.     Procedures: No procedures performed   Clinical Data: No additional findings.   Subjective: Chief Complaint  Patient presents with  . Lower Back - Pain  . Right Leg - Pain  . Left Leg - Pain    HPI 77-year-old female returns with ongoing problems with back pain and thigh pain.  She had previous injection in her hip on the right which gave her good relief which was temporary.  We are problems walking has trouble when she gets from sitting to standing and using a cane or a walker.  L4-5 level showed anterolisthesis and she is only able to walk Short distance and has to stop and sit.  Previous  x-ray showed greater right hip osteoarthritis and left with bone on bone and marginal osteophytes.  Lumbar x-rays showed grade 2 anterolisthesis at L4-5 with some narrowing at L5-S1.  She is used Tylenol No. 3 without relief.  Intra-articular hip  injection helped her right hip pain but did not help her lumbar spine pain.  Patient tried yoga short-term but states pain is progressed and she can no longer exercise and has great difficulty walking.  She has problems when she goes to the store grocery cart for relief.  She walks a short distance S to stop sit and rest for 4-5 minutes and then repeat short distance. Review of Systems positive for history of breast cancer, coronary artery disease.  2015.  Right anterior hip injection 03/29/2017 with short-term relief.  Otherwise negative and updated from 03/29/2017 office visit 14 point review of systems.   Objective: Vital Signs: BP (!) 144/70   Pulse 73   Ht 5\' 6"  (1.676 m)   Wt 217 lb (98.4 kg)   BMI 35.02 kg/m   Physical Exam  Constitutional: She is oriented to person, place, and time. She appears well-developed.  HENT:  Head: Normocephalic.  Right Ear: External ear normal.  Left Ear: External ear normal.  Eyes: Pupils are equal, round, and reactive to light.  Neck: No tracheal deviation present. No thyromegaly present.  Cardiovascular: Normal rate.  Pulmonary/Chest: Effort normal.  Abdominal: Soft.  Neurological: She is alert and oriented to person, place, and time.  Skin: Skin is warm and dry.  Psychiatric: She has a normal  mood and affect. Her behavior is normal.    Ortho Exam patient has bilateral sciatic notch tenderness.  Anterior tib gastrocsoleus is intact.  She has severe pain with internal rotation of her right hip at 0 degrees.  External rotation 20 degrees with pain.  Left hip 30 degrees internal rotation mild groin pain on the left he states this is not something she normally experiences.  Left hip externally rotates 40 degrees.  Specialty Comments:  No specialty comments available.  Imaging: No results found.   PMFS History: Patient Active Problem List   Diagnosis Date Noted  . Unilateral primary osteoarthritis, right hip 03/16/2017  . CAP (community acquired  pneumonia) 09/04/2014  . Leukocytosis 09/04/2014  . Hypokalemia 09/04/2014  . Breast cancer of upper-outer quadrant of right female breast ([place]) 08/27/2014  . Coronary atherosclerosis of native coronary artery 08/18/2013  . S/P PTCA (percutaneous transluminal coronary angioplasty) 08/17/2013   Past Medical History:  Diagnosis Date  . Aortic stenosis    mild to moderate AS 07/2013  . Arthritis    "hands; legs" (08/17/2013)  . CAP (community acquired pneumonia)   . GERD (gastroesophageal reflux disease)   . H/O hiatal hernia   . Heart murmur   . High cholesterol   . Hypertension   . Pneumonia 1970's   "once"  . Radiation 11/28/14-01/02/15   Right breast 50 Gray  . Vertigo     Family History  Problem Relation Age of Onset  . Cancer Brother   . Cancer Brother   . Heart attack Brother     Past Surgical History:  Procedure Laterality Date  . ABDOMINAL HYSTERECTOMY  1985  . APPENDECTOMY  1960  . BREAST LUMPECTOMY WITH NEEDLE LOCALIZATION AND AXILLARY SENTINEL LYMPH NODE BX Right 10/17/2014   Procedure: BREAST LUMPECTOMY WITH NEEDLE LOCALIZATION AND AXILLARY SENTINEL LYMPH NODE BX;  Surgeon: Jackolyn Confer, MD;  Location: [facility];  Service: General;  Laterality: Right;  . CARDIAC CATHETERIZATION  08/07/2013  . CORONARY ANGIOPLASTY WITH STENT PLACEMENT  08/17/2013   "6 stents"(08/17/2013)  . LEFT AND RIGHT HEART CATHETERIZATION WITH CORONARY ANGIOGRAM N/A 08/07/2013   Procedure: LEFT AND RIGHT HEART CATHETERIZATION WITH CORONARY ANGIOGRAM;  Surgeon: Laverda Page, MD;  Location: [facility] CATH LAB;  Service: Cardiovascular;  Laterality: N/A;  . PERCUTANEOUS CORONARY STENT INTERVENTION (PCI-S) N/A 08/17/2013   Procedure: PERCUTANEOUS CORONARY STENT INTERVENTION (PCI-S);  Surgeon: Laverda Page, MD;  Location: [facility] CATH LAB;  Service: Cardiovascular;  Laterality: N/A;  . TUBAL LIGATION  1980   Social History   Occupational History  . Not on file  Tobacco Use  . Smoking status: Former Smoker     Packs/day: 1.00    Years: 52.00    Pack years: 52.00    Types: Cigarettes    Last attempt to quit: 04/04/2014    Years since quitting: 3.4  . Smokeless tobacco: Never Used  Substance and Sexual Activity  . Alcohol use: No    Comment: 08/17/2013 "quit drinking at age 39; never did drink much"  . Drug use: No  . Sexual activity: Never

## 2017-09-30 ENCOUNTER — Other Ambulatory Visit: Payer: Medicare HMO

## 2017-10-03 ENCOUNTER — Ambulatory Visit
Admission: RE | Admit: 2017-10-03 | Discharge: 2017-10-03 | Disposition: A | Discharge status: Home / Self Care | Payer: Medicare HMO | Source: Ambulatory Visit | Attending: Orthopaedic Surgery | Admitting: Orthopaedic Surgery

## 2017-10-03 DIAGNOSIS — M4807 Spinal stenosis, lumbosacral region: Secondary | ICD-10-CM

## 2017-10-03 DIAGNOSIS — M48061 Spinal stenosis, lumbar region without neurogenic claudication: Secondary | ICD-10-CM | POA: Diagnosis not present

## 2017-10-12 ENCOUNTER — Ambulatory Visit (INDEPENDENT_AMBULATORY_CARE_PROVIDER_SITE_OTHER): Payer: Medicare HMO | Admitting: Orthopaedic Surgery

## 2017-10-12 ENCOUNTER — Encounter (INDEPENDENT_AMBULATORY_CARE_PROVIDER_SITE_OTHER): Payer: Self-pay | Admitting: Orthopaedic Surgery

## 2017-10-12 ENCOUNTER — Ambulatory Visit (INDEPENDENT_AMBULATORY_CARE_PROVIDER_SITE_OTHER): Payer: Medicare HMO

## 2017-10-12 VITALS — BP 113/73 | HR 70 | Ht 66.0 in | Wt 210.0 lb

## 2017-10-12 DIAGNOSIS — M1611 Unilateral primary osteoarthritis, right hip: Secondary | ICD-10-CM | POA: Diagnosis not present

## 2017-10-12 DIAGNOSIS — M4807 Spinal stenosis, lumbosacral region: Secondary | ICD-10-CM

## 2017-10-12 NOTE — Progress Notes (Signed)
Office Visit Note   Patient: Carolyn Rowland           Date of Birth: [birth year]           MRN: [id number] Visit Date: 10/12/2017              Requested by: Fanny Bien, [address] [place], [place] [address] PCP: Fanny Bien, MD   Assessment & Plan: Visit Diagnoses:  1. Spinal stenosis of lumbosacral region   2. Unilateral primary osteoarthritis, right hip           Severe right hip OA with moderate left hip OA  Plan: Patient currently is barely able to walk.  She has severe right hip osteoarthritis bone-on-bone changes marginal osteophyte subchondral sclerosis and no joint space.  Opposite left hip shows moderate osteoarthritic changes not as severe as the right.  We reviewed the MRI scan and she has a large disc bulge and is had progression of her single level stenosis which is now rated as severe with 4 mm shifting on flexion extension x-rays.  In supine position her shift is 6 mm and with stress x-rays she shifts a 13.5 mm.  Patient has 2 problems that will require surgery.  Would recommend proceeding with right total hip arthroplasty and when she recovers from this in a few months she would require single level instrumented fusion with interbody cage pedicle instrumentation for her severe stenosis at L4-5.  Plan would be direct anterior hip arthroplasty which would give her a lower incidence of hip dislocation particularly when she will require lumbar spine fusion.  We discussed increased hip dislocation with spine fusion patient's if the posterior approach is used.  Procedure outlined plan discussed.  Indications for surgery discussed.  Risks of femur fracture, infection, reoperation discussed.  She will require preoperative cardiology clearance that she is had a cardiac stent.  She is just taking a baby aspirin which could be continued and does not need to be stopped for her hip surgery.  MRI images were reviewed I gave her a copy of the report and we reviewed hip  radiographs that were obtained today that shows progression of her severe right hip osteoarthritis.  Follow-Up Instructions: No follow-ups on file.   Orders:  Orders Placed This Encounter  Procedures  . XR Lumbar Spine 2-3 Views  . XR HIP UNILAT W OR W/O PELVIS 2-3 VIEWS RIGHT   No orders of the defined types were placed in this encounter.     Procedures: No procedures performed   Clinical Data: No additional findings.   Subjective: Chief Complaint  Patient presents with  . Lower Back - Pain    S/p MRI    HPI 77 year old female returns with severe right groin and right thigh pain with ambulation.  She has pain at night cannot roll on the right side.  Difficulty getting from sitting to standing.  She has known severe lumbar stenosis at L4-5 and new MRI scan is available for review and comparison to 2016 lumbar MRI scan.  Patient denies associated bowel or bladder symptoms.  No chills or fever.  She can walk further leaning on a grocery cart with her arms.  She can walk 200 feet and then has to stop and sit.  She can stand for 5-10 minutes and has to sit down.  We had seen her back several years ago and discussed the severe spinal stenosis.  Her symptoms have now progressed where she  states she has to have something done.  She is had right breast cancer 2016 and also has had coronary stent placement in the interim.  MRI scan is reviewed today and I gave her a copy of the report.  Patient had temporary relief with an anterior intra-articular hip injection lasted for several days this was done in September 2018.  Review of Systems 14 point review of systems updated positive for coronary artery disease with stent placement.  Right breast cancer.  Lumbar severe spinal stenosis with instability at L4-5 single level disease.  Severe right hip osteoarthritis moderate left hip osteoarthritis.  History of community-acquired pneumonia.   Objective: Vital Signs: BP 113/73 (BP Location: Right  Arm, Patient Position: Sitting, Cuff Size: Large)   Pulse 70   Ht 5\' 6"  (1.676 m)   Wt 210 lb (95.3 kg)   BMI 33.89 kg/m   Physical Exam  Constitutional: She is oriented to person, place, and time. She appears well-developed.  HENT:  Head: Normocephalic.  Right Ear: External ear normal.  Left Ear: External ear normal.  Eyes: Pupils are equal, round, and reactive to light.  Neck: No tracheal deviation present. No thyromegaly present.  Cardiovascular: Normal rate.  Pulmonary/Chest: Effort normal.  Abdominal: Soft.  Neurological: She is alert and oriented to person, place, and time.  Skin: Skin is warm and dry.  Psychiatric: She has a normal mood and affect. Her behavior is normal.    Ortho Exam patient has to use her arms to get from sitting to standing bilateral Trendelenburg gait much worse on the right than the left.  0 degrees internal rotation right hip with severe groin and proximal thigh pain.  External rotation only 30 degrees with pain.  Knees reach full extension distal pulses are intact.  Knee and ankle jerk are intact.  She has some sciatic notch tenderness both right and left.  She walks with a forward flexed position at her hips.  She ambulates with a cane.  Specialty Comments: CLINICAL DATA:  Lumbar spinal stenosis. Back pain with bilateral leg pain. History of breast cancer.  EXAM: MRI LUMBAR SPINE WITHOUT CONTRAST  TECHNIQUE: Multiplanar, multisequence MR imaging of the lumbar spine was performed. No intravenous contrast was administered.  COMPARISON:  Lumbar MRI 08/03/2014  FINDINGS: Segmentation:  Normal  Alignment: 6 mm anterolisthesis L4-5, unchanged from the prior study. Remaining alignment normal  Vertebrae:  Normal bone marrow.  Negative for fracture or mass.  Conus medullaris and cauda equina: Conus extends to the L1-2 level. Conus and cauda equina appear normal.  Paraspinal and other soft tissues: Paraspinous soft tissues  normal. Retroperitoneal structures normal.  Disc levels:  L1-2: Negative  L2-3: Mild facet degeneration bilaterally.  Negative for stenosis  L3-4: Mild disc and facet degeneration.  Negative for stenosis  L4-5: 6 mm anterolisthesis. Diffuse disc bulging. Severe facet hypertrophy. Severe spinal stenosis has progressed. Severe subarticular and foraminal stenosis bilaterally similar to the prior study  L5-S1 mild disc and facet degeneration. Mild left foraminal narrowing.  IMPRESSION: Severe spinal stenosis L4-5 with progression. Severe subarticular foraminal stenosis bilaterally L4-5  Grade 1 anterolisthesis L4-5   Electronically Signed   By: Franchot Gallo M.D.   On: 10/03/2017 10:15    Imaging: Xr Lumbar Spine 2-3 Views  Result Date: 10/12/2017 AP lumbar x-ray and the lateral flexion-extension x-rays are obtained.  This shows degenerative spondylolisthesis at L4-5 with shifting of 9.5-13.5 mm with stress x-rays.  Bilateral hip osteoarthritis worse on the right than left  with right hip rated severe better seen on hip images. Impression: L4-5 instability with 4 mm of motion on flexion-extension x-rays, degenerative facets..  Comparison to recent MRI where in supine position she had 6 mm of anterolisthesis.    PMFS History: Patient Active Problem List   Diagnosis Date Noted  . Spinal stenosis of lumbosacral region 09/22/2017  . Bilateral primary osteoarthritis of hip 09/22/2017  . Unilateral primary osteoarthritis, right hip 03/16/2017  . CAP (community acquired pneumonia) 09/04/2014  . Leukocytosis 09/04/2014  . Hypokalemia 09/04/2014  . Breast cancer of upper-outer quadrant of right female breast ([facility]) 08/27/2014  . Coronary atherosclerosis of native coronary artery 08/18/2013  . S/P PTCA (percutaneous transluminal coronary angioplasty) 08/17/2013   Past Medical History:  Diagnosis Date  . Aortic stenosis    mild to moderate AS 07/2013  . Arthritis     "hands; legs" (08/17/2013)  . CAP (community acquired pneumonia)   . GERD (gastroesophageal reflux disease)   . H/O hiatal hernia   . Heart murmur   . High cholesterol   . Hypertension   . Pneumonia 1970's   "once"  . Radiation 11/28/14-01/02/15   Right breast 50 Gray  . Vertigo     Family History  Problem Relation Age of Onset  . Cancer Brother   . Cancer Brother   . Heart attack Brother     Past Surgical History:  Procedure Laterality Date  . ABDOMINAL HYSTERECTOMY  1985  . APPENDECTOMY  1960  . BREAST LUMPECTOMY WITH NEEDLE LOCALIZATION AND AXILLARY SENTINEL LYMPH NODE BX Right 10/17/2014   Procedure: BREAST LUMPECTOMY WITH NEEDLE LOCALIZATION AND AXILLARY SENTINEL LYMPH NODE BX;  Surgeon: Jackolyn Confer, MD;  Location: [facility];  Service: General;  Laterality: Right;  . CARDIAC CATHETERIZATION  08/07/2013  . CORONARY ANGIOPLASTY WITH STENT PLACEMENT  08/17/2013   "6 stents"(08/17/2013)  . LEFT AND RIGHT HEART CATHETERIZATION WITH CORONARY ANGIOGRAM N/A 08/07/2013   Procedure: LEFT AND RIGHT HEART CATHETERIZATION WITH CORONARY ANGIOGRAM;  Surgeon: Laverda Page, MD;  Location: [facility] CATH LAB;  Service: Cardiovascular;  Laterality: N/A;  . PERCUTANEOUS CORONARY STENT INTERVENTION (PCI-S) N/A 08/17/2013   Procedure: PERCUTANEOUS CORONARY STENT INTERVENTION (PCI-S);  Surgeon: Laverda Page, MD;  Location: [facility] CATH LAB;  Service: Cardiovascular;  Laterality: N/A;  . TUBAL LIGATION  1980   Social History   Occupational History  . Not on file  Tobacco Use  . Smoking status: Former Smoker    Packs/day: 1.00    Years: 52.00    Pack years: 52.00    Types: Cigarettes    Last attempt to quit: 04/04/2014    Years since quitting: 3.5  . Smokeless tobacco: Never Used  Substance and Sexual Activity  . Alcohol use: No    Comment: 08/17/2013 "quit drinking at age 31; never did drink much"  . Drug use: No  . Sexual activity: Never

## 2017-10-14 ENCOUNTER — Other Ambulatory Visit: Payer: Self-pay | Admitting: Cardiology

## 2017-10-14 DIAGNOSIS — E785 Hyperlipidemia, unspecified: Secondary | ICD-10-CM | POA: Diagnosis not present

## 2017-10-14 DIAGNOSIS — R079 Chest pain, unspecified: Secondary | ICD-10-CM

## 2017-10-14 DIAGNOSIS — I10 Essential (primary) hypertension: Secondary | ICD-10-CM | POA: Diagnosis not present

## 2017-10-14 DIAGNOSIS — I350 Nonrheumatic aortic (valve) stenosis: Secondary | ICD-10-CM | POA: Diagnosis not present

## 2017-10-14 DIAGNOSIS — I25119 Atherosclerotic heart disease of native coronary artery with unspecified angina pectoris: Secondary | ICD-10-CM | POA: Diagnosis not present

## 2017-10-14 DIAGNOSIS — M1990 Unspecified osteoarthritis, unspecified site: Secondary | ICD-10-CM | POA: Diagnosis not present

## 2017-10-14 DIAGNOSIS — R69 Illness, unspecified: Secondary | ICD-10-CM | POA: Diagnosis not present

## 2017-10-14 DIAGNOSIS — Z01810 Encounter for preprocedural cardiovascular examination: Secondary | ICD-10-CM | POA: Diagnosis not present

## 2017-10-19 DIAGNOSIS — I2510 Atherosclerotic heart disease of native coronary artery without angina pectoris: Secondary | ICD-10-CM | POA: Diagnosis not present

## 2017-10-19 DIAGNOSIS — R011 Cardiac murmur, unspecified: Secondary | ICD-10-CM | POA: Diagnosis not present

## 2017-10-19 DIAGNOSIS — Z6835 Body mass index (BMI) 35.0-35.9, adult: Secondary | ICD-10-CM | POA: Diagnosis not present

## 2017-10-19 DIAGNOSIS — I10 Essential (primary) hypertension: Secondary | ICD-10-CM | POA: Diagnosis not present

## 2017-10-19 DIAGNOSIS — R109 Unspecified abdominal pain: Secondary | ICD-10-CM | POA: Diagnosis not present

## 2017-10-19 DIAGNOSIS — K219 Gastro-esophageal reflux disease without esophagitis: Secondary | ICD-10-CM | POA: Diagnosis not present

## 2017-10-20 ENCOUNTER — Other Ambulatory Visit: Payer: Self-pay | Admitting: Family Medicine

## 2017-10-20 ENCOUNTER — Telehealth (INDEPENDENT_AMBULATORY_CARE_PROVIDER_SITE_OTHER): Payer: Self-pay | Admitting: Orthopaedic Surgery

## 2017-10-20 DIAGNOSIS — R109 Unspecified abdominal pain: Secondary | ICD-10-CM

## 2017-10-20 NOTE — Telephone Encounter (Signed)
Request for medical clearance was sent to patient's PCP Dr. Rachell Cipro n 10-12-17.  Patient is not cleared for surgery at this time because of GI problems.  Patient states that she is scheduled for an appointment to see Dr. Ernie Hew on Monday October 24, 2017 and will get in touch with our office to let us know the status of scheduling after she has been seen.

## 2017-10-24 ENCOUNTER — Ambulatory Visit (HOSPITAL_COMMUNITY)
Admission: RE | Admit: 2017-10-24 | Discharge: 2017-10-24 | Disposition: A | Discharge status: Home / Self Care | Payer: Medicare HMO | Source: Ambulatory Visit | Attending: Cardiology | Admitting: Cardiology

## 2017-10-24 DIAGNOSIS — R079 Chest pain, unspecified: Secondary | ICD-10-CM | POA: Diagnosis not present

## 2017-10-24 DIAGNOSIS — R072 Precordial pain: Secondary | ICD-10-CM | POA: Diagnosis not present

## 2017-10-24 MED ORDER — TECHNETIUM TC 99M TETROFOSMIN IV KIT
10.0000 | PACK | Freq: Once | INTRAVENOUS | Status: AC | PRN
  Administered 2017-10-24: 10 via INTRAVENOUS

## 2017-10-24 MED ORDER — TECHNETIUM TC 99M TETROFOSMIN IV KIT
30.0000 | PACK | Freq: Once | INTRAVENOUS | Status: AC | PRN
  Administered 2017-10-24: 30 via INTRAVENOUS

## 2017-10-24 MED ORDER — REGADENOSON 0.4 MG/5ML IV SOLN
INTRAVENOUS | Status: AC
  Filled 2017-10-24: qty 5

## 2017-10-24 MED ORDER — REGADENOSON 0.4 MG/5ML IV SOLN
0.4000 mg | Freq: Once | INTRAVENOUS | Status: AC
  Administered 2017-10-24: 0.4 mg via INTRAVENOUS

## 2017-10-24 NOTE — Telephone Encounter (Signed)
FYI

## 2017-10-24 NOTE — Telephone Encounter (Signed)
Please review.  Not cleared for surgery.

## 2017-10-25 DIAGNOSIS — K2960 Other gastritis without bleeding: Secondary | ICD-10-CM | POA: Diagnosis not present

## 2017-10-25 DIAGNOSIS — A048 Other specified bacterial intestinal infections: Secondary | ICD-10-CM | POA: Diagnosis not present

## 2017-10-25 DIAGNOSIS — B9681 Helicobacter pylori [H. pylori] as the cause of diseases classified elsewhere: Secondary | ICD-10-CM | POA: Diagnosis not present

## 2017-10-28 ENCOUNTER — Other Ambulatory Visit: Payer: Medicare HMO

## 2017-11-02 DIAGNOSIS — R922 Inconclusive mammogram: Secondary | ICD-10-CM | POA: Diagnosis not present

## 2017-11-02 DIAGNOSIS — Z853 Personal history of malignant neoplasm of breast: Secondary | ICD-10-CM | POA: Diagnosis not present

## 2017-11-02 DIAGNOSIS — Z780 Asymptomatic menopausal state: Secondary | ICD-10-CM | POA: Diagnosis not present

## 2017-11-02 DIAGNOSIS — M8589 Other specified disorders of bone density and structure, multiple sites: Secondary | ICD-10-CM | POA: Diagnosis not present

## 2017-11-15 ENCOUNTER — Ambulatory Visit
Admission: RE | Admit: 2017-11-15 | Discharge: 2017-11-15 | Disposition: A | Discharge status: Home / Self Care | Payer: Medicare HMO | Source: Ambulatory Visit | Attending: Family Medicine | Admitting: Family Medicine

## 2017-11-15 DIAGNOSIS — R109 Unspecified abdominal pain: Secondary | ICD-10-CM | POA: Diagnosis not present

## 2017-11-15 DIAGNOSIS — H524 Presbyopia: Secondary | ICD-10-CM | POA: Diagnosis not present

## 2017-11-15 DIAGNOSIS — R197 Diarrhea, unspecified: Secondary | ICD-10-CM | POA: Diagnosis not present

## 2017-11-22 DIAGNOSIS — R69 Illness, unspecified: Secondary | ICD-10-CM | POA: Diagnosis not present

## 2017-11-22 DIAGNOSIS — I10 Essential (primary) hypertension: Secondary | ICD-10-CM | POA: Diagnosis not present

## 2017-11-22 DIAGNOSIS — I25119 Atherosclerotic heart disease of native coronary artery with unspecified angina pectoris: Secondary | ICD-10-CM | POA: Diagnosis not present

## 2017-11-22 DIAGNOSIS — R32 Unspecified urinary incontinence: Secondary | ICD-10-CM | POA: Diagnosis not present

## 2017-11-22 DIAGNOSIS — G8929 Other chronic pain: Secondary | ICD-10-CM | POA: Diagnosis not present

## 2017-11-22 DIAGNOSIS — C50919 Malignant neoplasm of unspecified site of unspecified female breast: Secondary | ICD-10-CM | POA: Diagnosis not present

## 2017-11-22 DIAGNOSIS — I4891 Unspecified atrial fibrillation: Secondary | ICD-10-CM | POA: Diagnosis not present

## 2017-11-22 DIAGNOSIS — E785 Hyperlipidemia, unspecified: Secondary | ICD-10-CM | POA: Diagnosis not present

## 2017-11-22 DIAGNOSIS — K219 Gastro-esophageal reflux disease without esophagitis: Secondary | ICD-10-CM | POA: Diagnosis not present

## 2017-11-22 DIAGNOSIS — E669 Obesity, unspecified: Secondary | ICD-10-CM | POA: Diagnosis not present

## 2017-11-24 DIAGNOSIS — I350 Nonrheumatic aortic (valve) stenosis: Secondary | ICD-10-CM | POA: Diagnosis not present

## 2017-11-24 DIAGNOSIS — I25119 Atherosclerotic heart disease of native coronary artery with unspecified angina pectoris: Secondary | ICD-10-CM | POA: Diagnosis not present

## 2017-11-24 DIAGNOSIS — Z6835 Body mass index (BMI) 35.0-35.9, adult: Secondary | ICD-10-CM | POA: Diagnosis not present

## 2017-11-24 DIAGNOSIS — I10 Essential (primary) hypertension: Secondary | ICD-10-CM | POA: Diagnosis not present

## 2017-11-24 DIAGNOSIS — E782 Mixed hyperlipidemia: Secondary | ICD-10-CM | POA: Diagnosis not present

## 2017-11-27 ENCOUNTER — Other Ambulatory Visit: Payer: Self-pay

## 2017-11-27 ENCOUNTER — Encounter (HOSPITAL_COMMUNITY): Payer: Self-pay | Admitting: *Deleted

## 2017-11-27 ENCOUNTER — Ambulatory Visit (HOSPITAL_COMMUNITY)
Admission: EM | Admit: 2017-11-27 | Discharge: 2017-11-27 | Disposition: A | Discharge status: Home / Self Care | Payer: Medicare HMO | Attending: Family Medicine | Admitting: Family Medicine

## 2017-11-27 DIAGNOSIS — L03116 Cellulitis of left lower limb: Secondary | ICD-10-CM | POA: Diagnosis not present

## 2017-11-27 DIAGNOSIS — W57XXXA Bitten or stung by nonvenomous insect and other nonvenomous arthropods, initial encounter: Secondary | ICD-10-CM

## 2017-11-27 DIAGNOSIS — S90562A Insect bite (nonvenomous), left ankle, initial encounter: Secondary | ICD-10-CM

## 2017-11-27 MED ORDER — DOXYCYCLINE HYCLATE 100 MG PO CAPS: 100.0000 mg | ORAL_CAPSULE | Freq: Two times a day (BID) | ORAL | 0 refills | Status: DC

## 2017-11-27 NOTE — Discharge Instructions (Addendum)
Rest, ice and elevate ankle Take antibiotic as prescribed and to completion Follow up with your PCP if symptoms persists Return or go to the ER if you have any new or worsening symptoms such as increased swelling, redness, pain, streaking, fever, chills, nausea, vomiting, etc..Marland Kitchen

## 2017-11-27 NOTE — ED Provider Notes (Addendum)
[place]   [id number] 11/27/17 Arrival Time: 5284  SUBJECTIVE: History from: patient. Carolyn Rowland is a 77 y.o. female complains of left ankle pain and swellng that began 1 day ago.  Denies a precipitating event or specific injury, but speculates a bug may have bitten her.  Localizes the pain to the left ankle.  Describes the pain as constant and achy in character.  Has tried icing and hydrocortisone without relief.  Symptoms are made worse with ankle ROM.  Denies similar symptoms in the past.  Denies fever, chills, ecchymosis, weakness, numbness and tingling.      ROS: As per HPI.  No hx of DVT  Past Medical History:  Diagnosis Date  . Aortic stenosis    mild to moderate AS 07/2013  . Arthritis    "hands; legs" (08/17/2013)  . CAP (community acquired pneumonia)   . GERD (gastroesophageal reflux disease)   . H/O hiatal hernia   . Heart murmur   . High cholesterol   . Hypertension   . Pneumonia 1970's   "once"  . Radiation 11/28/14-01/02/15   Right breast 50 Gray  . Vertigo    Past Surgical History:  Procedure Laterality Date  . ABDOMINAL HYSTERECTOMY  1985  . APPENDECTOMY  1960  . BREAST LUMPECTOMY WITH NEEDLE LOCALIZATION AND AXILLARY SENTINEL LYMPH NODE BX Right 10/17/2014   Procedure: BREAST LUMPECTOMY WITH NEEDLE LOCALIZATION AND AXILLARY SENTINEL LYMPH NODE BX;  Surgeon: Jackolyn Confer, MD;  Location: [facility];  Service: General;  Laterality: Right;  . CARDIAC CATHETERIZATION  08/07/2013  . CORONARY ANGIOPLASTY WITH STENT PLACEMENT  08/17/2013   "6 stents"(08/17/2013)  . LEFT AND RIGHT HEART CATHETERIZATION WITH CORONARY ANGIOGRAM N/A 08/07/2013   Procedure: LEFT AND RIGHT HEART CATHETERIZATION WITH CORONARY ANGIOGRAM;  Surgeon: Laverda Page, MD;  Location: [facility] CATH LAB;  Service: Cardiovascular;  Laterality: N/A;  . PERCUTANEOUS CORONARY STENT INTERVENTION (PCI-S) N/A 08/17/2013   Procedure: PERCUTANEOUS CORONARY STENT INTERVENTION (PCI-S);  Surgeon: Laverda Page, MD;  Location: [facility] CATH LAB;  Service: Cardiovascular;  Laterality: N/A;  . TUBAL LIGATION  1980   Allergies  Allergen Reactions  . Penicillins Rash   No current facility-administered medications on file prior to encounter.    Current Outpatient Medications on File Prior to Encounter  Medication Sig Dispense Refill  . acetaminophen-codeine (TYLENOL #3) 300-30 MG tablet Take 1 tablet by mouth 3 (three) times daily as needed for moderate pain. 30 tablet 0  . amLODipine (NORVASC) 2.5 MG tablet Take 2.5 mg by mouth daily.    Marland Kitchen anastrozole (ARIMIDEX) 1 MG tablet Take 1 tablet (1 mg total) by mouth daily. 90 tablet 3  . aspirin 81 MG chewable tablet Chew 1 tablet (81 mg total) by mouth daily.    . Calcium Carbonate-Vitamin D (CALCIUM-VITAMIN D3) 600-125 MG-UNIT TABS Take 1 tablet by mouth daily.    . CRESTOR 20 MG tablet Take 20 mg by mouth daily.     Marland Kitchen emollient (BIAFINE) cream Apply topically 2 (two) times daily. Reported on 12/29/2015    . gabapentin (NEURONTIN) 300 MG capsule Take 1 capsule (300 mg total) by mouth at bedtime. (Patient not taking: Reported on 08/19/2016) 30 capsule 0  . HYDROcodone-acetaminophen (NORCO/VICODIN) 5-325 MG tablet Take 0.5 tablets by mouth every 4 (four) hours as needed. (Patient not taking: Reported on 01/07/2017) 10 tablet 0  . ibuprofen (ADVIL,MOTRIN) 800 MG tablet Take 800 mg by mouth daily as needed.    . isosorbide mononitrate (IMDUR)  60 MG 24 hr tablet Take 1 tablet (60 mg total) by mouth daily. 30 tablet 1  . meclizine (ANTIVERT) 25 MG tablet Take 1 tablet (25 mg total) by mouth 3 (three) times daily as needed for dizziness. (Patient not taking: Reported on 03/16/2017) 30 tablet 0  . metoprolol succinate (TOPROL-XL) 50 MG 24 hr tablet Take 50 mg by mouth daily. Take with or immediately following a meal.    . Multiple Vitamins-Minerals (MULTIVITAMIN WITH MINERALS) tablet Take 1 tablet by mouth daily.    . nitroGLYCERIN (NITROSTAT) 0.4 MG SL tablet Place 1  tablet (0.4 mg total) under the tongue every 5 (five) minutes as needed for chest pain. 30 tablet 1  . spironolactone (ALDACTONE) 25 MG tablet Take 25 mg by mouth daily.     Social History   Socioeconomic History  . Marital status: Married    Spouse name: Not on file  . Number of children: Not on file  . Years of education: Not on file  . Highest education level: Not on file  Occupational History  . Not on file  Social Needs  . Financial resource strain: Not on file  . Food insecurity:    Worry: Not on file    Inability: Not on file  . Transportation needs:    Medical: Not on file    Non-medical: Not on file  Tobacco Use  . Smoking status: Former Smoker    Packs/day: 1.00    Years: 52.00    Pack years: 52.00    Types: Cigarettes    Last attempt to quit: 04/04/2014    Years since quitting: 3.6  . Smokeless tobacco: Never Used  Substance and Sexual Activity  . Alcohol use: No    Comment: 08/17/2013 "quit drinking at age 37; never did drink much"  . Drug use: No  . Sexual activity: Never  Lifestyle  . Physical activity:    Days per week: Not on file    Minutes per session: Not on file  . Stress: Not on file  Relationships  . Social connections:    Talks on phone: Not on file    Gets together: Not on file    Attends religious service: Not on file    Active member of club or organization: Not on file    Attends meetings of clubs or organizations: Not on file    Relationship status: Not on file  . Intimate partner violence:    Fear of current or ex partner: Not on file    Emotionally abused: Not on file    Physically abused: Not on file    Forced sexual activity: Not on file  Other Topics Concern  . Not on file  Social History Narrative  . Not on file   Family History  Problem Relation Age of Onset  . Cancer Brother   . Cancer Brother   . Heart attack Brother     OBJECTIVE:  Vitals:   11/27/17 1344  BP: 117/63  Pulse: 76  Temp: 98.3 F (36.8 C)    TempSrc: Oral  SpO2: 100%    General appearance: AOx3; in no acute distress.  Head: NCAT Lungs: CTA bilaterally Heart: grade 3 aortic stenosis.  Radial pulses 2+ bilaterally. Musculoskeletal: Left ankle Inspection: Skin warn to the touch with erythema; moderate swelling about the lateral and posterior aspect of the ankle (see pictures below); Swelling not circumferential  Palpation: Diffusely tender about the lateral and posterior aspect ROM: LROM about the ankle Strength:  5/5 knee flexion, 5/5 knee extension, 5/5 dorsiflexion, 5/5 plantar flexion CV: Dorsalis pedis pulse 2+ intact; cap refill 2+ secs Skin: see above Neurologic: Ambulates with minimal difficulty; Sensation intact about the lower extremities Psychological: alert and cooperative; normal mood and affect        ASSESSMENT & PLAN:  1. Cellulitis of left lower extremity   2. Insect bite of left ankle, initial encounter     Meds ordered this encounter  Medications  . doxycycline (VIBRAMYCIN) 100 MG capsule    Sig: Take 1 capsule (100 mg total) by mouth 2 (two) times daily.    Dispense:  20 capsule    Refill:  0    Order Specific Question:   Supervising Provider    Answer:   Wynona Luna [[id number]]    Rest, ice and elevate ankle Take antibiotic as prescribed and to completion Follow up with your PCP if symptoms persists Return or go to the ER if you have any new or worsening symptoms such as increased swelling, redness, pain, streaking, fever, chills, nausea, vomiting, etc...  Reviewed expectations re: course of current medical issues. Questions answered. Outlined signs and symptoms indicating need for more acute intervention. Patient verbalized understanding. After Visit Summary given.    Lestine Box, PA-C 11/27/17 [place], [place], PA-C 11/27/17 [place], [place], [place] 11/27/17 [phone]

## 2017-11-27 NOTE — ED Triage Notes (Signed)
Per pt something bit her left ankle, per pt she don't know what it is, ankle is swollen

## 2017-12-01 ENCOUNTER — Other Ambulatory Visit (INDEPENDENT_AMBULATORY_CARE_PROVIDER_SITE_OTHER): Payer: Self-pay

## 2017-12-07 ENCOUNTER — Encounter (INDEPENDENT_AMBULATORY_CARE_PROVIDER_SITE_OTHER): Payer: Self-pay | Admitting: Surgery

## 2017-12-07 ENCOUNTER — Ambulatory Visit (INDEPENDENT_AMBULATORY_CARE_PROVIDER_SITE_OTHER): Payer: Medicare HMO | Admitting: Surgery

## 2017-12-07 VITALS — BP 145/78 | HR 68 | Temp 97.0°F | Ht 63.0 in | Wt 210.0 lb

## 2017-12-07 DIAGNOSIS — M1611 Unilateral primary osteoarthritis, right hip: Secondary | ICD-10-CM

## 2017-12-07 NOTE — Progress Notes (Signed)
  77 year old female with history of end-stage DJD right hip comes in for preop H&P.  She continues have ongoing hip pain.  She is wanting to proceed with right total hip replacement as scheduled.  We received preop cardiac and medical clearances.  Patient states that April 2019 she was diagnosed with H. pylori and was started on medication to treat by Dr. Ernie Hew primary care physician.  Patient states that she is also been taking ibuprofen which aggravated her stomach symptoms.  States that she has not been referred to a gastroenterologist and I also do not see documentation of this in the chart.  I have some questions about postop DVT prophylaxis.  I did speak with Dr. Lorin Mercy regarding this and he recommends using aspirin 81 mg daily postop instead of the 325 mg p.o. Daily.  Today full H&P was placed in patient's chart.

## 2017-12-07 NOTE — H&P (Signed)
TOTAL HIP ADMISSION H&P  Patient is admitted for right total hip arthroplasty.  Subjective:  Chief Complaint: right hip pain  HPI: Carolyn Rowland, 77 y.o. female, has a history of pain and functional disability in the right hip(s) due to arthritis and patient has failed non-surgical conservative treatments for greater than 12 weeks to include NSAID's and/or analgesics, use of assistive devices and activity modification.  Onset of symptoms was gradual starting 10 years ago with gradually worsening course since that time.The patient noted no past surgery on the right hip(s).  Patient currently rates pain in the right hip at 10 out of 10 with activity. Patient has night pain, worsening of pain with activity and weight bearing, trendelenberg gait, pain that interfers with activities of daily living and joint swelling. Patient has evidence of subchondral sclerosis, periarticular osteophytes and joint space narrowing by imaging studies. This condition presents safety issues increasing the risk of falls.   There is no current active infection.  Patient was recently diagnosed with H. pylori by primary care physician and treated for a couple of weeks with medication.  She denies current GI issues.  She has been taking ibuprofen around that time which also aggravated GI problems.  Patient was not evaluated by gastroenterologist.  States that she has a remote history of stomach ulcers.  Patient Active Problem List   Diagnosis Date Noted  . Spinal stenosis of lumbosacral region 09/22/2017  . Bilateral primary osteoarthritis of hip 09/22/2017  . Unilateral primary osteoarthritis, right hip 03/16/2017  . CAP (community acquired pneumonia) 09/04/2014  . Leukocytosis 09/04/2014  . Hypokalemia 09/04/2014  . Breast cancer of upper-outer quadrant of right female breast ([place]) 08/27/2014  . Coronary atherosclerosis of native coronary artery 08/18/2013  . S/P PTCA (percutaneous transluminal coronary angioplasty)  08/17/2013   Past Medical History:  Diagnosis Date  . Aortic stenosis    mild to moderate AS 07/2013  . Arthritis    "hands; legs" (08/17/2013)  . CAP (community acquired pneumonia)   . GERD (gastroesophageal reflux disease)   . H/O hiatal hernia   . Heart murmur   . High cholesterol   . Hypertension   . Pneumonia 1970's   "once"  . Radiation 11/28/14-01/02/15   Right breast 50 Gray  . Vertigo     Past Surgical History:  Procedure Laterality Date  . ABDOMINAL HYSTERECTOMY  1985  . APPENDECTOMY  1960  . BREAST LUMPECTOMY WITH NEEDLE LOCALIZATION AND AXILLARY SENTINEL LYMPH NODE BX Right 10/17/2014   Procedure: BREAST LUMPECTOMY WITH NEEDLE LOCALIZATION AND AXILLARY SENTINEL LYMPH NODE BX;  Surgeon: Jackolyn Confer, MD;  Location: [facility];  Service: General;  Laterality: Right;  . CARDIAC CATHETERIZATION  08/07/2013  . CORONARY ANGIOPLASTY WITH STENT PLACEMENT  08/17/2013   "6 stents"(08/17/2013)  . LEFT AND RIGHT HEART CATHETERIZATION WITH CORONARY ANGIOGRAM N/A 08/07/2013   Procedure: LEFT AND RIGHT HEART CATHETERIZATION WITH CORONARY ANGIOGRAM;  Surgeon: Laverda Page, MD;  Location: [facility] CATH LAB;  Service: Cardiovascular;  Laterality: N/A;  . PERCUTANEOUS CORONARY STENT INTERVENTION (PCI-S) N/A 08/17/2013   Procedure: PERCUTANEOUS CORONARY STENT INTERVENTION (PCI-S);  Surgeon: Laverda Page, MD;  Location: [facility] CATH LAB;  Service: Cardiovascular;  Laterality: N/A;  . TUBAL LIGATION  1980    No current facility-administered medications for this encounter.    Current Outpatient Medications  Medication Sig Dispense Refill Last Dose  . amLODipine (NORVASC) 2.5 MG tablet Take 2.5 mg by mouth daily.   Taking  . anastrozole (ARIMIDEX) 1  MG tablet Take 1 tablet (1 mg total) by mouth daily. 90 tablet 3 Taking  . aspirin 81 MG chewable tablet Chew 1 tablet (81 mg total) by mouth daily.   Taking  . Cholecalciferol (VITAMIN D3) 400 units CAPS Take 400 Units by mouth daily.   Taking  . CRESTOR  20 MG tablet Take 20 mg by mouth daily.    Taking  . isosorbide mononitrate (IMDUR) 60 MG 24 hr tablet Take 1 tablet (60 mg total) by mouth daily. 30 tablet 1 Taking  . metoprolol succinate (TOPROL-XL) 50 MG 24 hr tablet Take 50 mg by mouth daily. Take with or immediately following a meal.   Taking  . metroNIDAZOLE (FLAGYL) 500 MG tablet Take 500 mg by mouth 3 (three) times daily.   Taking  . Multiple Minerals-Vitamins (CALCIUM & VIT D3 BONE HEALTH PO) Take 1 tablet by mouth 2 (two) times a week.   Taking  . naproxen sodium (ALEVE) 220 MG tablet Take 440 mg by mouth 2 (two) times daily as needed (for pain).   Taking  . nitroGLYCERIN (NITROSTAT) 0.4 MG SL tablet Place 1 tablet (0.4 mg total) under the tongue every 5 (five) minutes as needed for chest pain. 30 tablet 1 Taking  . omeprazole (PRILOSEC) 40 MG capsule Take 40 mg by mouth daily.  1 Taking  . spironolactone (ALDACTONE) 25 MG tablet Take 25 mg by mouth daily.   Taking  . acetaminophen-codeine (TYLENOL #3) 300-30 MG tablet Take 1 tablet by mouth 3 (three) times daily as needed for moderate pain. 30 tablet 0 Taking  . doxycycline (VIBRAMYCIN) 100 MG capsule Take 1 capsule (100 mg total) by mouth 2 (two) times daily. 20 capsule 0 Taking   Allergies  Allergen Reactions  . Penicillins Rash    Has patient had a PCN reaction causing immediate rash, facial/tongue/throat swelling, SOB or lightheadedness with hypotension: No Has patient had a PCN reaction causing severe rash involving mucus membranes or skin necrosis: Unknown Has patient had a PCN reaction that required hospitalization: No Has patient had a PCN reaction occurring within the last 10 years: No If all of the above answers are "NO", then may proceed with Cephalosporin use.     Social History   Tobacco Use  . Smoking status: Former Smoker    Packs/day: 1.00    Years: 52.00    Pack years: 52.00    Types: Cigarettes    Last attempt to quit: 04/04/2014    Years since quitting:  3.6  . Smokeless tobacco: Never Used  Substance Use Topics  . Alcohol use: No    Comment: 08/17/2013 "quit drinking at age 74; never did drink much"    Family History  Problem Relation Age of Onset  . Cancer Brother   . Cancer Brother   . Heart attack Brother      Review of Systems  Constitutional: Negative.   HENT: Negative.   Respiratory: Negative.   Cardiovascular: Negative.   Gastrointestinal: Negative for abdominal pain, blood in stool and nausea.       Was recently diagnosed with H. pylori infection April 2019 and treated by primary care physician for this.  Genitourinary: Negative.   Musculoskeletal: Positive for joint pain.  Skin: Negative.   Neurological: Negative.   Psychiatric/Behavioral: Negative.     Objective:  Physical Exam  Constitutional: She is oriented to person, place, and time. She appears well-nourished. No distress.  HENT:  Head: Normocephalic and atraumatic.  Eyes: Pupils are equal,  round, and reactive to light. EOM are normal.  Neck: Normal range of motion.  Cardiovascular: Normal rate.  Murmur heard. Respiratory: Effort normal. No respiratory distress. She has no wheezes.  GI: Soft. Bowel sounds are normal. She exhibits no distension. There is no tenderness.  Neurological: She is alert and oriented to person, place, and time.  Skin: Skin is warm and dry.  Psychiatric: She has a normal mood and affect.    Vital signs in last 24 hours: Temp:  [97 F (36.1 C)] 97 F (36.1 C) (06/05 1101) Pulse Rate:  [68] 68 (06/05 1101) BP: (145)/(78) 145/78 (06/05 1101) Weight:  [210 lb (95.3 kg)] 210 lb (95.3 kg) (06/05 1101)  Labs:   Estimated body mass index is 37.2 kg/m as calculated from the following:   Height as of 12/07/17: 5\' 3"  (1.6 m).   Weight as of 12/07/17: 210 lb (95.3 kg).   Imaging Review Plain radiographs demonstrate moderate degenerative joint disease of the right hip(s). The bone quality appears to be good for age and reported  activity level.    Preoperative templating of the joint replacement has been completed, documented, and submitted to the Operating Room personnel in order to optimize intra-operative equipment management.     Assessment/Plan:  End stage arthritis, right hip(s)  The patient history, physical examination, clinical judgement of the provider and imaging studies are consistent with end stage degenerative joint disease of the right hip(s) and total hip arthroplasty is deemed medically necessary. The treatment options including medical management, injection therapy, arthroscopy and arthroplasty were discussed at length. The risks and benefits of total hip arthroplasty were presented and reviewed. The risks due to aseptic loosening, infection, stiffness, dislocation/subluxation,  thromboembolic complications and other imponderables were discussed.  The patient acknowledged the explanation, agreed to proceed with the plan and consent was signed. Patient is being admitted for inpatient treatment for surgery, pain control, PT, OT, prophylactic antibiotics, VTE prophylaxis, progressive ambulation and ADL's and discharge planning.The patient is planning to be discharged home with home health services  Per Dr. Lorin Mercy we will use aspirin 81 mg daily postop for DVT prophylaxis.

## 2017-12-09 ENCOUNTER — Other Ambulatory Visit (HOSPITAL_COMMUNITY): Payer: Self-pay | Admitting: *Deleted

## 2017-12-09 ENCOUNTER — Encounter (HOSPITAL_COMMUNITY)
Admission: RE | Admit: 2017-12-09 | Discharge: 2017-12-09 | Disposition: A | Discharge status: Home / Self Care | Payer: Medicare HMO | Source: Ambulatory Visit | Attending: Surgery | Admitting: Surgery

## 2017-12-09 ENCOUNTER — Other Ambulatory Visit: Payer: Self-pay

## 2017-12-09 ENCOUNTER — Encounter (HOSPITAL_COMMUNITY): Payer: Self-pay

## 2017-12-09 ENCOUNTER — Encounter (HOSPITAL_COMMUNITY)
Admission: RE | Admit: 2017-12-09 | Discharge: 2017-12-09 | Disposition: A | Discharge status: Home / Self Care | Payer: Medicare HMO | Source: Ambulatory Visit | Attending: Orthopaedic Surgery | Admitting: Orthopaedic Surgery

## 2017-12-09 DIAGNOSIS — Z01810 Encounter for preprocedural cardiovascular examination: Secondary | ICD-10-CM | POA: Diagnosis not present

## 2017-12-09 DIAGNOSIS — Z01818 Encounter for other preprocedural examination: Secondary | ICD-10-CM

## 2017-12-09 DIAGNOSIS — J984 Other disorders of lung: Secondary | ICD-10-CM | POA: Diagnosis not present

## 2017-12-09 DIAGNOSIS — Z01812 Encounter for preprocedural laboratory examination: Secondary | ICD-10-CM | POA: Insufficient documentation

## 2017-12-09 HISTORY — DX: Angina pectoris, unspecified: I20.9

## 2017-12-09 HISTORY — DX: Atherosclerotic heart disease of native coronary artery without angina pectoris: I25.10

## 2017-12-09 HISTORY — DX: Anemia, unspecified: D64.9

## 2017-12-09 LAB — URINALYSIS, ROUTINE W REFLEX MICROSCOPIC
Bilirubin Urine: NEGATIVE
Glucose, UA: NEGATIVE mg/dL
Hgb urine dipstick: NEGATIVE
Ketones, ur: NEGATIVE mg/dL
Leukocytes, UA: NEGATIVE
Nitrite: NEGATIVE
Protein, ur: NEGATIVE mg/dL
Specific Gravity, Urine: 1.021 (ref 1.005–1.030)
pH: 5 (ref 5.0–8.0)

## 2017-12-09 LAB — CBC
HCT: 39.1 % (ref 36.0–46.0)
Hemoglobin: 13.3 g/dL (ref 12.0–15.0)
MCH: 27.2 pg (ref 26.0–34.0)
MCHC: 34 g/dL (ref 30.0–36.0)
MCV: 80 fL (ref 78.0–100.0)
Platelets: 194 10*3/uL (ref 150–400)
RBC: 4.89 MIL/uL (ref 3.87–5.11)
RDW: 15.5 % (ref 11.5–15.5)
WBC: 5.1 10*3/uL (ref 4.0–10.5)

## 2017-12-09 LAB — COMPREHENSIVE METABOLIC PANEL
ALT: 11 U/L — ABNORMAL LOW (ref 14–54)
AST: 16 U/L (ref 15–41)
Albumin: 3.6 g/dL (ref 3.5–5.0)
Alkaline Phosphatase: 46 U/L (ref 38–126)
Anion gap: 9 (ref 5–15)
BUN: 15 mg/dL (ref 6–20)
CO2: 25 mmol/L (ref 22–32)
Calcium: 9.3 mg/dL (ref 8.9–10.3)
Chloride: 107 mmol/L (ref 101–111)
Creatinine, Ser: 0.75 mg/dL (ref 0.44–1.00)
GFR calc Af Amer: >60 mL/min (ref >60)
GFR calc non Af Amer: >60 mL/min (ref >60)
Glucose, Bld: 104 mg/dL — ABNORMAL HIGH (ref 65–99)
Potassium: 3.9 mmol/L (ref 3.5–5.1)
Sodium: 141 mmol/L (ref 135–145)
Total Bilirubin: 0.5 mg/dL (ref 0.3–1.2)
Total Protein: 6.9 g/dL (ref 6.5–8.1)

## 2017-12-09 LAB — SURGICAL PCR SCREEN
MRSA, PCR: NEGATIVE
Staphylococcus aureus: NEGATIVE

## 2017-12-09 NOTE — Progress Notes (Signed)
Pt has hx of CAD with six stents placed in 2015 by Dr. Einar Gip. Pt now sees Dr. Terrence Dupont for cardiology. She denies any recent chest pain or sob. Pt states she is not diabetic.  Have requested last office visit notes and any recent heart studies from Dr. Terrence Dupont.  Pt states she was instructed to stop her Aspirin 10 days prior to surgery.

## 2017-12-09 NOTE — Pre-Procedure Instructions (Signed)
DAWANDA MAPEL  [birth year]    Your procedure is scheduled on Monday, December 19, 2017 at 12:30 PM.   Report to [facility] Entrance "A" Admitting Office at 10:30 AM.   Call this number if you have problems the morning of surgery: [phone]   Questions prior to day of surgery, please call [phone] between 8 & 4 PM.   Remember:  Do not eat food or drink liquids after midnight Sunday, 12/18/17.  Take these medicines the morning of surgery with A SIP OF WATER: Amlodipine (Norvasc), Anastrolzole (Arimidex), Isosorbide Mononitrate (Imdur), Metoprolol (Toprol XL), Omeprazole (Prilosec), Tylenol #3 - if needed  Stop Aspirin only as instructed by surgeon or cardiologist. Stop NSAIDS (Naproxen, Aleve, Ibuprofen, etc) 7 days prior to surgery.    Do not wear jewelry, make-up or nail polish.  Do not wear lotions, powders, perfumes or deodorant.  Do not shave 48 hours prior to surgery.    Do not bring valuables to the hospital.  [facility] is not responsible for any belongings or valuables.  Contacts, dentures or bridgework may not be worn into surgery.  Leave your suitcase in the car.  After surgery it may be brought to your room.  For patients admitted to the hospital, discharge time will be determined by your treatment team.  [facility] - Preparing for Surgery  Before surgery, you can play an important role.  Because skin is not sterile, your skin needs to be as free of germs as possible.  You can reduce the number of germs on you skin by washing with CHG (chlorahexidine gluconate) soap before surgery.  CHG is an antiseptic cleaner which kills germs and bonds with the skin to continue killing germs even after washing.  Oral Hygiene is also important in reducing the risk of infection.  Remember to brush your teeth with your regular toothpaste the morning of surgery.  Please DO NOT use if you have an allergy to CHG or antibacterial soaps.  If your skin becomes reddened/irritated stop  using the CHG and inform your nurse when you arrive at Short Stay.  Do not shave (including legs and underarms) for at least 48 hours prior to the first CHG shower.  You may shave your face.  Please follow these instructions carefully:   1.  Shower with CHG Soap the night before surgery and the morning of Surgery.  2.  If you choose to wash your hair, wash your hair first as usual with your normal shampoo.  3.  After you shampoo, rinse your hair and body thoroughly to remove the shampoo. 4.  Use CHG as you would any other liquid soap.  You can apply chg directly to the skin and wash gently with a      scrungie or washcloth.           5.  Apply the CHG Soap to your body ONLY FROM THE NECK DOWN.   Do not use on open wounds or open sores. Avoid contact with your eyes, ears, mouth and genitals (private parts).  Wash genitals (private parts) with your normal soap.  6.  Wash thoroughly, paying special attention to the area where your surgery will be performed.  7.  Thoroughly rinse your body with warm water from the neck down.  8.  DO NOT shower/wash with your normal soap after using and rinsing off the CHG Soap.  9.  Pat yourself dry with a clean towel.  10.  Wear clean pajamas.            11.  Place clean sheets on your bed the night of your first shower and do not sleep with pets.  Day of Surgery  Shower as above.  Do not apply any lotions/deodorants the morning of surgery.   Please wear clean clothes to the hospital. Remember to brush your teeth with toothpaste.   Please read over the fact sheets that you were given.

## 2017-12-12 NOTE — Progress Notes (Signed)
Anesthesia Chart Review:   Case:  [id number] Date/Time:  12/19/17 1215   Procedure:  TOTAL HIP ARTHROPLASTY ANTERIOR APPROACH (Right )   Anesthesia type:  Choice   Pre-op diagnosis:  RIGHT HIP OSTEOARTHRITIS   Location:  [place] OR ROOM 05 / [place] OR   Surgeon:  Marybelle Killings, MD      DISCUSSION: - Pt is a 77 year old female with hx CAD (s/p DES x4 to CX, DES x 2 to CTO RCA 2015), moderate aortic stenosis  - Pt to stop ASA 10 days before surgery   - Pt had recent normal stress test 10/24/17 in anticipation of surgery    VS: BP (!) 121/55   Pulse 70   Temp 36.9 C (Oral)   Resp 20   Ht 5\' 3"  (1.6 m)   Wt 210 lb 9.6 oz (95.5 kg)   SpO2 98%   BMI 37.31 kg/m   PROVIDERS: PCP is Carolyn Bien, MD Cardiologist is Carolyn Forward, MD. Last office visit 10/14/17   LABS: Labs reviewed: Acceptable for surgery. (all labs ordered are listed, but only abnormal results are displayed)  Labs Reviewed  COMPREHENSIVE METABOLIC PANEL - Abnormal; Notable for the following components:      Result Value   Glucose, Bld 104 (*)    ALT 11 (*)    All other components within normal limits  SURGICAL PCR SCREEN  CBC  URINALYSIS, ROUTINE W REFLEX MICROSCOPIC     IMAGES:  CXR 12/09/17: No acute abnormalities.   EKG 12/09/17: NSR   CV:  Nuclear stress test 10/24/17:  1. No reversible ischemia or infarction. 2. Normal left ventricular wall motion. 3. Left ventricular ejection fraction 72% 4. Non invasive risk stratification: Low  Echo 11/06/16 (Dr. Zenia Rowland office): 1.  Normal LV size.  LV systolic function normal, with normal EF 55-60%.  LVH is seen with abnormal diastolic function noted. 2.  Moderate aortic stenosis present.  Valve area approximately 1 cm. 3.  Trace tricuspid regurgitation is seen. 4.  No intracardiac shunting. 5.  No intracardiac masses or thrombi. 6.  No pericardial effusion is seen with no suggestion of pericardial tamponade  PCI 08/17/13:  - Successful Stenting of  Proximal and distal circumflex with 4 overlapping DES - OM1 branch was angioscored with a angiosculpt 2.5 x 10 mm balloon, performed to protect closure after stenting -Successful Stenting of CTO  RCA. Mid to distal RCA DES and proximal DES  Cardiac cath 08/07/13:  1. RCA: Total occlusion just proximal to the bifurcation of PLV and PDA branches; (or collateralized by left system. 2.  LM: Normal, no stenosis. 3.  LAD: Moderate sized to D1 and D2 have mild ostial disease.  LAD itself has noncritical CAD in proximal and mid segment; distal LAD near the apex has an eccentric 80-90% stenosis. 4.  CX: Proximal CX has obtained of 90-85% stenosis followed by ectasia in both poststenotic areas.  Large OM1 has 70-80% stenosis at bifurcation of CX and OM1; distal CX has 90% stenosis.  Anatomy is very complex.  Presence of ectasia and presence of bifurcation stenosis places this as a high risk anatomy   Past Medical History:  Diagnosis Date  . Anemia    in younger years  . Anginal pain ([place])   . Aortic stenosis    mild to moderate AS 07/2013  . Arthritis    "hands; legs" (08/17/2013)  . CAP (community acquired pneumonia)   . Coronary artery disease    6  stents in 2015  . GERD (gastroesophageal reflux disease)   . H/O hiatal hernia   . Heart murmur   . High cholesterol   . Hypertension   . Pneumonia 1970's   "once"  . Radiation 11/28/14-01/02/15   Right breast 50 Gray  . Vertigo     Past Surgical History:  Procedure Laterality Date  . ABDOMINAL HYSTERECTOMY  1985  . APPENDECTOMY  1960  . BREAST LUMPECTOMY WITH NEEDLE LOCALIZATION AND AXILLARY SENTINEL LYMPH NODE BX Right 10/17/2014   Procedure: BREAST LUMPECTOMY WITH NEEDLE LOCALIZATION AND AXILLARY SENTINEL LYMPH NODE BX;  Surgeon: Carolyn Confer, MD;  Location: [place];  Service: General;  Laterality: Right;  . CARDIAC CATHETERIZATION  08/07/2013  . COLONOSCOPY    . CORONARY ANGIOPLASTY WITH STENT PLACEMENT  08/17/2013   "6 stents"(08/17/2013)   . LEFT AND RIGHT HEART CATHETERIZATION WITH CORONARY ANGIOGRAM N/A 08/07/2013   Procedure: LEFT AND RIGHT HEART CATHETERIZATION WITH CORONARY ANGIOGRAM;  Surgeon: Carolyn Page, MD;  Location: [facility] CATH LAB;  Service: Cardiovascular;  Laterality: N/A;  . PERCUTANEOUS CORONARY STENT INTERVENTION (PCI-S) N/A 08/17/2013   Procedure: PERCUTANEOUS CORONARY STENT INTERVENTION (PCI-S);  Surgeon: Carolyn Page, MD;  Location: [facility] CATH LAB;  Service: Cardiovascular;  Laterality: N/A;  . TUBAL LIGATION  1980    MEDICATIONS: . acetaminophen-codeine (TYLENOL #3) 300-30 MG tablet  . amLODipine (NORVASC) 2.5 MG tablet  . anastrozole (ARIMIDEX) 1 MG tablet  . aspirin 81 MG chewable tablet  . Cholecalciferol (VITAMIN D3) 400 units CAPS  . CRESTOR 20 MG tablet  . doxycycline (VIBRAMYCIN) 100 MG capsule  . isosorbide mononitrate (IMDUR) 60 MG 24 hr tablet  . metoprolol succinate (TOPROL-XL) 50 MG 24 hr tablet  . metroNIDAZOLE (FLAGYL) 500 MG tablet  . Multiple Minerals-Vitamins (CALCIUM & VIT D3 BONE HEALTH PO)  . naproxen sodium (ALEVE) 220 MG tablet  . nitroGLYCERIN (NITROSTAT) 0.4 MG SL tablet  . omeprazole (PRILOSEC) 40 MG capsule  . spironolactone (ALDACTONE) 25 MG tablet   No current facility-administered medications for this encounter.    - Pt to stop ASA 10 days before surgery    If no changes, I anticipate pt can proceed with surgery as scheduled.   Carolyn Cass, FNP-BC [facility]/Anesthesiology Phone: [phone] 12/12/2017 11:55 AM

## 2017-12-16 MED ORDER — VANCOMYCIN HCL IN DEXTROSE 1-5 GM/200ML-% IV SOLN
1000.0000 mg | INTRAVENOUS | Status: AC
  Administered 2017-12-19: 1000 mg via INTRAVENOUS
  Filled 2017-12-16: qty 200

## 2017-12-19 ENCOUNTER — Inpatient Hospital Stay (HOSPITAL_COMMUNITY): Payer: Medicare HMO

## 2017-12-19 ENCOUNTER — Encounter (HOSPITAL_COMMUNITY): Payer: Self-pay | Admitting: *Deleted

## 2017-12-19 ENCOUNTER — Inpatient Hospital Stay (HOSPITAL_COMMUNITY): Payer: Medicare HMO | Admitting: Emergency Medicine

## 2017-12-19 ENCOUNTER — Inpatient Hospital Stay (HOSPITAL_COMMUNITY)
Admission: RE | Admit: 2017-12-19 | Discharge: 2017-12-27 | DRG: 470 | Disposition: A | Discharge status: Skilled Nursing Facility | Payer: Medicare HMO | Source: Ambulatory Visit | Attending: Orthopaedic Surgery | Admitting: Orthopaedic Surgery

## 2017-12-19 ENCOUNTER — Inpatient Hospital Stay (HOSPITAL_COMMUNITY): Payer: Medicare HMO | Admitting: Certified Registered Nurse Anesthetist

## 2017-12-19 ENCOUNTER — Encounter (HOSPITAL_COMMUNITY)
Admission: RE | Disposition: A | Discharge status: Skilled Nursing Facility | Payer: Self-pay | Source: Ambulatory Visit | Attending: Orthopaedic Surgery

## 2017-12-19 DIAGNOSIS — Z79811 Long term (current) use of aromatase inhibitors: Secondary | ICD-10-CM

## 2017-12-19 DIAGNOSIS — Z880 Allergy status to penicillin: Secondary | ICD-10-CM | POA: Diagnosis not present

## 2017-12-19 DIAGNOSIS — I350 Nonrheumatic aortic (valve) stenosis: Secondary | ICD-10-CM | POA: Diagnosis not present

## 2017-12-19 DIAGNOSIS — Z96641 Presence of right artificial hip joint: Secondary | ICD-10-CM | POA: Diagnosis not present

## 2017-12-19 DIAGNOSIS — Z9181 History of falling: Secondary | ICD-10-CM | POA: Diagnosis not present

## 2017-12-19 DIAGNOSIS — E7800 Pure hypercholesterolemia, unspecified: Secondary | ICD-10-CM | POA: Diagnosis not present

## 2017-12-19 DIAGNOSIS — Z09 Encounter for follow-up examination after completed treatment for conditions other than malignant neoplasm: Secondary | ICD-10-CM

## 2017-12-19 DIAGNOSIS — Z471 Aftercare following joint replacement surgery: Secondary | ICD-10-CM | POA: Diagnosis not present

## 2017-12-19 DIAGNOSIS — Z79899 Other long term (current) drug therapy: Secondary | ICD-10-CM

## 2017-12-19 DIAGNOSIS — C50911 Malignant neoplasm of unspecified site of right female breast: Secondary | ICD-10-CM | POA: Diagnosis not present

## 2017-12-19 DIAGNOSIS — Z9889 Other specified postprocedural states: Secondary | ICD-10-CM | POA: Diagnosis not present

## 2017-12-19 DIAGNOSIS — Z7982 Long term (current) use of aspirin: Secondary | ICD-10-CM | POA: Diagnosis not present

## 2017-12-19 DIAGNOSIS — M1611 Unilateral primary osteoarthritis, right hip: Secondary | ICD-10-CM | POA: Diagnosis not present

## 2017-12-19 DIAGNOSIS — Z923 Personal history of irradiation: Secondary | ICD-10-CM

## 2017-12-19 DIAGNOSIS — K219 Gastro-esophageal reflux disease without esophagitis: Secondary | ICD-10-CM | POA: Diagnosis present

## 2017-12-19 DIAGNOSIS — Z8711 Personal history of peptic ulcer disease: Secondary | ICD-10-CM | POA: Diagnosis not present

## 2017-12-19 DIAGNOSIS — I10 Essential (primary) hypertension: Secondary | ICD-10-CM | POA: Diagnosis present

## 2017-12-19 DIAGNOSIS — Z955 Presence of coronary angioplasty implant and graft: Secondary | ICD-10-CM

## 2017-12-19 DIAGNOSIS — M4807 Spinal stenosis, lumbosacral region: Secondary | ICD-10-CM | POA: Diagnosis not present

## 2017-12-19 DIAGNOSIS — Z419 Encounter for procedure for purposes other than remedying health state, unspecified: Secondary | ICD-10-CM

## 2017-12-19 DIAGNOSIS — R110 Nausea: Secondary | ICD-10-CM | POA: Diagnosis not present

## 2017-12-19 DIAGNOSIS — D62 Acute posthemorrhagic anemia: Secondary | ICD-10-CM | POA: Diagnosis not present

## 2017-12-19 DIAGNOSIS — I2510 Atherosclerotic heart disease of native coronary artery without angina pectoris: Secondary | ICD-10-CM | POA: Diagnosis not present

## 2017-12-19 DIAGNOSIS — Z8719 Personal history of other diseases of the digestive system: Secondary | ICD-10-CM

## 2017-12-19 DIAGNOSIS — Z87891 Personal history of nicotine dependence: Secondary | ICD-10-CM | POA: Diagnosis not present

## 2017-12-19 DIAGNOSIS — Z792 Long term (current) use of antibiotics: Secondary | ICD-10-CM

## 2017-12-19 DIAGNOSIS — M79609 Pain in unspecified limb: Secondary | ICD-10-CM | POA: Diagnosis not present

## 2017-12-19 DIAGNOSIS — C50411 Malignant neoplasm of upper-outer quadrant of right female breast: Secondary | ICD-10-CM | POA: Diagnosis not present

## 2017-12-19 HISTORY — PX: TOTAL HIP ARTHROPLASTY: SHX124

## 2017-12-19 SURGERY — ARTHROPLASTY, HIP, TOTAL, ANTERIOR APPROACH
Anesthesia: Spinal | Site: Hip | Laterality: Right

## 2017-12-19 MED ORDER — PANTOPRAZOLE SODIUM 40 MG PO TBEC
40.0000 mg | DELAYED_RELEASE_TABLET | Freq: Every day | ORAL | Status: DC
  Administered 2017-12-19 – 2017-12-27 (×8): 40 mg via ORAL
  Filled 2017-12-19 (×9): qty 1

## 2017-12-19 MED ORDER — 0.9 % SODIUM CHLORIDE (POUR BTL) OPTIME
TOPICAL | Status: DC | PRN
  Administered 2017-12-19: 1000 mL

## 2017-12-19 MED ORDER — LACTATED RINGERS IV SOLN
INTRAVENOUS | Status: DC | PRN
  Administered 2017-12-19 (×2): via INTRAVENOUS

## 2017-12-19 MED ORDER — PHENYLEPHRINE HCL 10 MG/ML IJ SOLN
INTRAVENOUS | Status: DC | PRN
  Administered 2017-12-19: 20 ug/min via INTRAVENOUS

## 2017-12-19 MED ORDER — ACETAMINOPHEN 325 MG PO TABS
325.0000 mg | ORAL_TABLET | Freq: Four times a day (QID) | ORAL | Status: DC | PRN
  Administered 2017-12-21 – 2017-12-25 (×8): 650 mg via ORAL
  Filled 2017-12-19 (×8): qty 2

## 2017-12-19 MED ORDER — SODIUM CHLORIDE 0.9 % IV SOLN
INTRAVENOUS | Status: DC
  Administered 2017-12-19 – 2017-12-20 (×2): via INTRAVENOUS

## 2017-12-19 MED ORDER — DOCUSATE SODIUM 100 MG PO CAPS
100.0000 mg | ORAL_CAPSULE | Freq: Two times a day (BID) | ORAL | Status: DC
  Administered 2017-12-19 – 2017-12-27 (×14): 100 mg via ORAL
  Filled 2017-12-19 (×16): qty 1

## 2017-12-19 MED ORDER — PHENOL 1.4 % MT LIQD: 1.0000 | OROMUCOSAL | Status: DC | PRN

## 2017-12-19 MED ORDER — MENTHOL 3 MG MT LOZG: 1.0000 | LOZENGE | OROMUCOSAL | Status: DC | PRN

## 2017-12-19 MED ORDER — HYDROMORPHONE HCL 2 MG/ML IJ SOLN: 0.2500 mg | INTRAMUSCULAR | Status: DC | PRN

## 2017-12-19 MED ORDER — METHOCARBAMOL 500 MG PO TABS
500.0000 mg | ORAL_TABLET | Freq: Four times a day (QID) | ORAL | Status: DC | PRN
  Administered 2017-12-19 – 2017-12-26 (×13): 500 mg via ORAL
  Filled 2017-12-19 (×16): qty 1

## 2017-12-19 MED ORDER — ASPIRIN EC 325 MG PO TBEC
325.0000 mg | DELAYED_RELEASE_TABLET | Freq: Every day | ORAL | Status: DC
  Administered 2017-12-20 – 2017-12-27 (×7): 325 mg via ORAL
  Filled 2017-12-19 (×9): qty 1

## 2017-12-19 MED ORDER — FENTANYL CITRATE (PF) 250 MCG/5ML IJ SOLN
INTRAMUSCULAR | Status: AC
  Filled 2017-12-19: qty 5

## 2017-12-19 MED ORDER — KETOROLAC TROMETHAMINE 30 MG/ML IJ SOLN: 30.0000 mg | Freq: Once | INTRAMUSCULAR | Status: DC | PRN

## 2017-12-19 MED ORDER — HYDROMORPHONE HCL 2 MG/ML IJ SOLN
0.5000 mg | INTRAMUSCULAR | Status: DC | PRN
  Administered 2017-12-19 – 2017-12-20 (×4): 0.5 mg via INTRAVENOUS
  Filled 2017-12-19 (×4): qty 1

## 2017-12-19 MED ORDER — VANCOMYCIN HCL IN DEXTROSE 1-5 GM/200ML-% IV SOLN
1000.0000 mg | Freq: Two times a day (BID) | INTRAVENOUS | Status: AC
  Administered 2017-12-19: 1000 mg via INTRAVENOUS
  Filled 2017-12-19: qty 200

## 2017-12-19 MED ORDER — CHLORHEXIDINE GLUCONATE 4 % EX LIQD: 60.0000 mL | Freq: Once | CUTANEOUS | Status: DC

## 2017-12-19 MED ORDER — BUPIVACAINE-EPINEPHRINE (PF) 0.25% -1:200000 IJ SOLN
INTRAMUSCULAR | Status: AC
  Filled 2017-12-19: qty 30

## 2017-12-19 MED ORDER — PANTOPRAZOLE SODIUM 40 MG PO TBEC
40.0000 mg | DELAYED_RELEASE_TABLET | ORAL | Status: AC
  Filled 2017-12-19: qty 1

## 2017-12-19 MED ORDER — ONDANSETRON HCL 4 MG PO TABS
4.0000 mg | ORAL_TABLET | Freq: Four times a day (QID) | ORAL | Status: DC | PRN
  Administered 2017-12-22 (×2): 4 mg via ORAL
  Filled 2017-12-19 (×2): qty 1

## 2017-12-19 MED ORDER — ONDANSETRON HCL 4 MG/2ML IJ SOLN
4.0000 mg | Freq: Four times a day (QID) | INTRAMUSCULAR | Status: DC | PRN
  Administered 2017-12-20: 4 mg via INTRAVENOUS
  Filled 2017-12-19 (×2): qty 2

## 2017-12-19 MED ORDER — SODIUM CHLORIDE 0.9 % IV SOLN
1000.0000 mg | INTRAVENOUS | Status: AC
  Administered 2017-12-19: 1000 mg via INTRAVENOUS
  Filled 2017-12-19: qty 1100

## 2017-12-19 MED ORDER — MEPERIDINE HCL 50 MG/ML IJ SOLN: 6.2500 mg | INTRAMUSCULAR | Status: DC | PRN

## 2017-12-19 MED ORDER — OXYCODONE HCL 5 MG PO TABS
5.0000 mg | ORAL_TABLET | Freq: Four times a day (QID) | ORAL | Status: DC | PRN
  Administered 2017-12-19: 10 mg via ORAL
  Administered 2017-12-20: 5 mg via ORAL
  Filled 2017-12-19: qty 2
  Filled 2017-12-19: qty 1

## 2017-12-19 MED ORDER — METRONIDAZOLE 500 MG PO TABS
500.0000 mg | ORAL_TABLET | Freq: Three times a day (TID) | ORAL | Status: DC
  Administered 2017-12-19 – 2017-12-27 (×22): 500 mg via ORAL
  Filled 2017-12-19 (×23): qty 1

## 2017-12-19 MED ORDER — ONDANSETRON HCL 4 MG/2ML IJ SOLN
INTRAMUSCULAR | Status: AC
  Filled 2017-12-19: qty 2

## 2017-12-19 MED ORDER — ONDANSETRON HCL 4 MG/2ML IJ SOLN
INTRAMUSCULAR | Status: DC | PRN
  Administered 2017-12-19: 4 mg via INTRAVENOUS

## 2017-12-19 MED ORDER — CHOLECALCIFEROL 10 MCG (400 UNIT) PO TABS
400.0000 [IU] | ORAL_TABLET | Freq: Every day | ORAL | Status: DC
  Administered 2017-12-19 – 2017-12-27 (×8): 400 [IU] via ORAL
  Filled 2017-12-19 (×12): qty 1

## 2017-12-19 MED ORDER — FENTANYL CITRATE (PF) 100 MCG/2ML IJ SOLN
INTRAMUSCULAR | Status: DC | PRN
  Administered 2017-12-19 (×2): 50 ug via INTRAVENOUS

## 2017-12-19 MED ORDER — PROPOFOL 500 MG/50ML IV EMUL
INTRAVENOUS | Status: DC | PRN
  Administered 2017-12-19: 100 ug/kg/min via INTRAVENOUS

## 2017-12-19 MED ORDER — DEXAMETHASONE SODIUM PHOSPHATE 10 MG/ML IJ SOLN
INTRAMUSCULAR | Status: AC
  Filled 2017-12-19: qty 1

## 2017-12-19 MED ORDER — ISOSORBIDE MONONITRATE ER 60 MG PO TB24
60.0000 mg | ORAL_TABLET | Freq: Every day | ORAL | Status: DC
  Administered 2017-12-20 – 2017-12-27 (×7): 60 mg via ORAL
  Filled 2017-12-19 (×8): qty 1

## 2017-12-19 MED ORDER — LACTATED RINGERS IV SOLN: INTRAVENOUS | Status: DC

## 2017-12-19 MED ORDER — METOPROLOL SUCCINATE ER 50 MG PO TB24
50.0000 mg | ORAL_TABLET | Freq: Every day | ORAL | Status: DC
Start: 2017-12-20 — End: 2017-12-27
  Administered 2017-12-20 – 2017-12-27 (×7): 50 mg via ORAL
  Filled 2017-12-19 (×9): qty 1

## 2017-12-19 MED ORDER — DEXAMETHASONE SODIUM PHOSPHATE 10 MG/ML IJ SOLN
INTRAMUSCULAR | Status: DC | PRN
  Administered 2017-12-19: 10 mg via INTRAVENOUS

## 2017-12-19 MED ORDER — BUPIVACAINE IN DEXTROSE 0.75-8.25 % IT SOLN
INTRATHECAL | Status: DC | PRN
  Administered 2017-12-19: 1.6 mL via INTRATHECAL

## 2017-12-19 MED ORDER — AMLODIPINE BESYLATE 2.5 MG PO TABS
2.5000 mg | ORAL_TABLET | Freq: Every day | ORAL | Status: DC
  Administered 2017-12-20 – 2017-12-27 (×7): 2.5 mg via ORAL
  Filled 2017-12-19 (×9): qty 1

## 2017-12-19 MED ORDER — BUPIVACAINE-EPINEPHRINE 0.25% -1:200000 IJ SOLN
INTRAMUSCULAR | Status: DC | PRN
  Administered 2017-12-19: 20 mL

## 2017-12-19 MED ORDER — ANASTROZOLE 1 MG PO TABS
1.0000 mg | ORAL_TABLET | Freq: Every day | ORAL | Status: DC
  Administered 2017-12-20 – 2017-12-27 (×7): 1 mg via ORAL
  Filled 2017-12-19 (×8): qty 1

## 2017-12-19 MED ORDER — ALBUMIN HUMAN 5 % IV SOLN
INTRAVENOUS | Status: DC | PRN
  Administered 2017-12-19: 15:00:00 via INTRAVENOUS

## 2017-12-19 MED ORDER — SPIRONOLACTONE 25 MG PO TABS
25.0000 mg | ORAL_TABLET | Freq: Every day | ORAL | Status: DC
  Administered 2017-12-20 – 2017-12-27 (×7): 25 mg via ORAL
  Filled 2017-12-19 (×8): qty 1

## 2017-12-19 MED ORDER — PHENYLEPHRINE 40 MCG/ML (10ML) SYRINGE FOR IV PUSH (FOR BLOOD PRESSURE SUPPORT)
PREFILLED_SYRINGE | INTRAVENOUS | Status: DC | PRN
  Administered 2017-12-19 (×3): 80 ug via INTRAVENOUS

## 2017-12-19 MED ORDER — PHENYLEPHRINE 40 MCG/ML (10ML) SYRINGE FOR IV PUSH (FOR BLOOD PRESSURE SUPPORT)
PREFILLED_SYRINGE | INTRAVENOUS | Status: AC
  Filled 2017-12-19: qty 10

## 2017-12-19 MED ORDER — POLYETHYLENE GLYCOL 3350 17 G PO PACK
17.0000 g | PACK | Freq: Every day | ORAL | Status: DC | PRN
  Administered 2017-12-21: 17 g via ORAL
  Filled 2017-12-19 (×2): qty 1

## 2017-12-19 MED ORDER — DEXTROSE 5 % IV SOLN
500.0000 mg | Freq: Four times a day (QID) | INTRAVENOUS | Status: DC | PRN
  Filled 2017-12-19: qty 5

## 2017-12-19 MED ORDER — METOCLOPRAMIDE HCL 5 MG/ML IJ SOLN
5.0000 mg | Freq: Three times a day (TID) | INTRAMUSCULAR | Status: DC | PRN
  Administered 2017-12-20: 10 mg via INTRAVENOUS
  Filled 2017-12-19: qty 2

## 2017-12-19 MED ORDER — PROMETHAZINE HCL 25 MG/ML IJ SOLN: 6.2500 mg | INTRAMUSCULAR | Status: DC | PRN

## 2017-12-19 MED ORDER — NITROGLYCERIN 0.4 MG SL SUBL: 0.4000 mg | SUBLINGUAL_TABLET | SUBLINGUAL | Status: DC | PRN

## 2017-12-19 MED ORDER — ROSUVASTATIN CALCIUM 10 MG PO TABS
20.0000 mg | ORAL_TABLET | Freq: Every day | ORAL | Status: DC
  Administered 2017-12-19 – 2017-12-26 (×7): 20 mg via ORAL
  Filled 2017-12-19 (×8): qty 2

## 2017-12-19 MED ORDER — METOCLOPRAMIDE HCL 5 MG PO TABS: 5.0000 mg | ORAL_TABLET | Freq: Three times a day (TID) | ORAL | Status: DC | PRN

## 2017-12-19 SURGICAL SUPPLY — 42 items
BENZOIN TINCTURE PRP APPL 2/3 (GAUZE/BANDAGES/DRESSINGS) ×2 IMPLANT
BLADE SAW SGTL 18X1.27X75 (BLADE) ×2 IMPLANT
CAPT HIP TOTAL 2 ×2 IMPLANT
CELLS DAT CNTRL 66122 CELL SVR (MISCELLANEOUS) ×1 IMPLANT
COVER SURGICAL LIGHT HANDLE (MISCELLANEOUS) ×2 IMPLANT
DRAPE C-ARM 42X72 X-RAY (DRAPES) ×2 IMPLANT
DRAPE IMP U-DRAPE 54X76 (DRAPES) ×2 IMPLANT
DRAPE STERI IOBAN 125X83 (DRAPES) ×2 IMPLANT
DRAPE U-SHAPE 47X51 STRL (DRAPES) ×6 IMPLANT
DRSG MEPILEX BORDER 4X12 (GAUZE/BANDAGES/DRESSINGS) ×2 IMPLANT
DRSG MEPILEX BORDER 4X8 (GAUZE/BANDAGES/DRESSINGS) ×2 IMPLANT
DURAPREP 26ML APPLICATOR (WOUND CARE) ×2 IMPLANT
ELECT BLADE 4.0 EZ CLEAN MEGAD (MISCELLANEOUS)
ELECT CAUTERY BLADE 6.4 (BLADE) ×2 IMPLANT
ELECT REM PT RETURN 9FT ADLT (ELECTROSURGICAL) ×2
ELECTRODE BLDE 4.0 EZ CLN MEGD (MISCELLANEOUS) IMPLANT
ELECTRODE REM PT RTRN 9FT ADLT (ELECTROSURGICAL) ×1 IMPLANT
FACESHIELD WRAPAROUND (MASK) ×4 IMPLANT
GLOVE BIOGEL PI IND STRL 8 (GLOVE) ×2 IMPLANT
GLOVE BIOGEL PI INDICATOR 8 (GLOVE) ×2
GLOVE ORTHO TXT STRL SZ7.5 (GLOVE) ×6 IMPLANT
GOWN STRL REUS W/ TWL LRG LVL3 (GOWN DISPOSABLE) ×1 IMPLANT
GOWN STRL REUS W/ TWL XL LVL3 (GOWN DISPOSABLE) ×1 IMPLANT
GOWN STRL REUS W/TWL 2XL LVL3 (GOWN DISPOSABLE) ×2 IMPLANT
GOWN STRL REUS W/TWL LRG LVL3 (GOWN DISPOSABLE) ×1
GOWN STRL REUS W/TWL XL LVL3 (GOWN DISPOSABLE) ×1
KIT BASIN OR (CUSTOM PROCEDURE TRAY) ×2 IMPLANT
KIT TURNOVER KIT B (KITS) ×2 IMPLANT
MANIFOLD NEPTUNE II (INSTRUMENTS) ×2 IMPLANT
NS IRRIG 1000ML POUR BTL (IV SOLUTION) ×2 IMPLANT
PACK TOTAL JOINT (CUSTOM PROCEDURE TRAY) ×2 IMPLANT
PAD ARMBOARD 7.5X6 YLW CONV (MISCELLANEOUS) ×4 IMPLANT
RTRCTR WOUND ALEXIS 18CM MED (MISCELLANEOUS) ×2
STRIP CLOSURE SKIN 1/2X4 (GAUZE/BANDAGES/DRESSINGS) ×2 IMPLANT
SUT VIC AB 0 CT1 27 (SUTURE) ×1
SUT VIC AB 0 CT1 27XBRD ANBCTR (SUTURE) ×1 IMPLANT
SUT VIC AB 2-0 CT1 27 (SUTURE) ×1
SUT VIC AB 2-0 CT1 TAPERPNT 27 (SUTURE) ×1 IMPLANT
SUT VICRYL 4-0 PS2 18IN ABS (SUTURE) ×2 IMPLANT
SUT VLOC 180 0 24IN GS25 (SUTURE) ×2 IMPLANT
TOWEL OR 17X26 10 PK STRL BLUE (TOWEL DISPOSABLE) ×2 IMPLANT
TRAY CATH 16FR W/PLASTIC CATH (SET/KITS/TRAYS/PACK) ×2 IMPLANT

## 2017-12-19 NOTE — Anesthesia Procedure Notes (Signed)
Procedure Name: MAC Date/Time: 12/19/2017 12:55 PM Performed by: Harden Mo, CRNA Pre-anesthesia Checklist: Patient identified, Emergency Drugs available, Suction available and Patient being monitored Patient Re-evaluated:Patient Re-evaluated prior to induction Oxygen Delivery Method: Simple face mask Preoxygenation: Pre-oxygenation with 100% oxygen Induction Type: IV induction Placement Confirmation: positive ETCO2 and breath sounds checked- equal and bilateral Dental Injury: Teeth and Oropharynx as per pre-operative assessment

## 2017-12-19 NOTE — Transfer of Care (Signed)
Immediate Anesthesia Transfer of Care Note  Patient: Carolyn Rowland  Procedure(s) Performed: RIGHT TOTAL HIP ARTHROPLASTY ANTERIOR APPROACH (Right Hip)  Patient Location: PACU  Anesthesia Type:MAC and Spinal  Level of Consciousness: awake and alert   Airway & Oxygen Therapy: Patient Spontanous Breathing  Post-op Assessment: Report given to RN and Post -op Vital signs reviewed and stable  Post vital signs: Reviewed and stable  Last Vitals:  Vitals Value Taken Time  BP    Temp    Pulse 78 12/19/2017  3:31 PM  Resp 18 12/19/2017  3:31 PM  SpO2 94 % 12/19/2017  3:31 PM  Vitals shown include unvalidated device data.  Last Pain:  Vitals:   12/19/17 1050  TempSrc:   PainSc: 8       Patients Stated Pain Goal: 3 (15/05/69 7948)  Complications: No apparent anesthesia complications

## 2017-12-19 NOTE — Anesthesia Procedure Notes (Signed)
Spinal  Patient location during procedure: OR Start time: 12/19/2017 12:54 PM End time: 12/19/2017 12:57 PM Staffing Anesthesiologist: Lyn Hollingshead, MD Performed: anesthesiologist  Preanesthetic Checklist Completed: patient identified, site marked, surgical consent, pre-op evaluation, timeout performed, IV checked, risks and benefits discussed and monitors and equipment checked Spinal Block Patient position: sitting Prep: site prepped and draped and DuraPrep Patient monitoring: continuous pulse ox and blood pressure Approach: midline Location: L3-4 Injection technique: single-shot Needle Needle type: Spinocan  Needle gauge: 24 G Needle length: 10 cm Needle insertion depth: 8 cm Assessment Sensory level: T8

## 2017-12-19 NOTE — Interval H&P Note (Signed)
History and Physical Interval Note:  0/02/6760 95:09 PM  Carolyn Rowland  has presented today for surgery, with the diagnosis of RIGHT HIP OSTEOARTHRITIS  The various methods of treatment have been discussed with the patient and family. After consideration of risks, benefits and other options for treatment, the patient has consented to  Procedure(s): RIGHT TOTAL HIP ARTHROPLASTY ANTERIOR APPROACH (Right) as a surgical intervention .  The patient's history has been reviewed, patient examined, no change in status, stable for surgery.  I have reviewed the patient's chart and labs.  Questions were answered to the patient's satisfaction.     Marybelle Killings

## 2017-12-19 NOTE — Plan of Care (Signed)
  Problem: Education: Goal: Knowledge of General Education information will improve Outcome: Progressing   Problem: Clinical Measurements: Goal: Ability to maintain clinical measurements within normal limits will improve Outcome: Progressing   Problem: Clinical Measurements: Goal: Will remain free from infection Outcome: Progressing   Problem: Elimination: Goal: Will not experience complications related to bowel motility Outcome: Progressing   Problem: Pain Managment: Goal: General experience of comfort will improve Outcome: Progressing

## 2017-12-19 NOTE — Anesthesia Preprocedure Evaluation (Addendum)
Anesthesia Evaluation  Patient identified by MRN, date of birth, ID band Patient awake    Reviewed: Allergy & Precautions, NPO status , Patient's Chart, lab work & pertinent test results, reviewed documented beta blocker date and time   Airway Mallampati: II  TM Distance: >3 FB Neck ROM: Full    Dental no notable dental hx. (+) Dental Advisory Given, Partial Upper   Pulmonary pneumonia, former smoker,    Pulmonary exam normal breath sounds clear to auscultation       Cardiovascular hypertension, Pt. on home beta blockers and Pt. on medications + CAD and + Cardiac Stents  Normal cardiovascular exam+ Valvular Problems/Murmurs  Rhythm:Regular Rate:Normal     Neuro/Psych negative neurological ROS  negative psych ROS   GI/Hepatic Neg liver ROS, hiatal hernia, GERD  Medicated,  Endo/Other  negative endocrine ROS  Renal/GU negative Renal ROS  negative genitourinary   Musculoskeletal negative musculoskeletal ROS (+) Arthritis , Osteoarthritis,    Abdominal (+) + obese,   Peds  Hematology negative hematology ROS (+)   Anesthesia Other Findings CV Procedure by Adrian Prows, MD at 08/17/2013 10:22 AM   Author: Adrian Prows, MD Service: Cardiac Cath Author Type: Physician Filed: 08/18/2013 12:42 PM Date of Service: 08/17/2013 10:22 AM Status: Addendum Editor: Adrian Prows, MD (Physician) Related Notes: Original Note by Adrian Prows, MD (Physician) filed at 08/18/2013 10:57 AM    Procedure performed: 08/17/2013: Stenting of Proximal and distal circumflex with 4 DES stents. 3.0x12, 3.0x16 promus Premier drug-eluting stents sandwiched in proximal circumflex, mid 2.5x18 Xience Alpine DES and distal 2.5x16 mm promus Premier DES.  Stenting of CTO  RCA. Mid to distal RCA 3.0x38 and proximal 3.0x28 mm promus Premier DES.  Indication: Patient is a fairly active 77 year old African American female who undergone coronary angiography on 08/07/2013  for markedly abnormal stress test, shortness of breath. Patient was able to walk only for 1.5 minutes on the treadmill with marked dyspnea, leading to [facility] which revealed severe inferior and lateral wall ischemia. Coronary angiography had revealed CTO of a large RCA faintly collateralized to the left system, and very complex high-grade stenosis of the proximal and distal circumflex coronary artery, midsegment at OM1 also revealing about 70-80% stenosis with involvement of large OM1 ostium about 80%, OM 2 which is small with ostial 50-60% stenosis. In spite of aggressive medical therapy due to ongoing symptoms of dyspnea, also on her office visit patient stated that she's been having burning sensations just with activity, that she had not previously mentioned, was thinking that this is related to GERD, and this chest pain was relieved with sublingual nitroglycerin. Hence given ongoing symptoms, markedly reduced exercise capacity, it is felt that proceeding with coronary angiography with eye towards revascularization of circumflex and also RCA was indicated.  Angiographic data:  Right coronary artery: it's a dominant vessel, is occluded in the midsegment, faint ipsilateral collateral filling of the distal vessel is evident. Total occlusion occurs just proximal to the bifurcation of PLV and PDA branches. The PDA and PL branches are collateralized by the left system.   Left main coronary artery: Normal. No stenosis.  LAD: Large.Krystal Clark origin to a moderate sized diagonal 1, D2 Which have mild ostial disease. The LAD itself as mild noncritical coronary artery disease in the proximal and midsegment, mild calcification is also evident. Distal LAD near the apex has a eccentric 80-90% stenosis.   Circumflex coronary artery: It is codominant with the right coronary artery. It s a very large vessel.  Proximal circumflex has tandem 90-95% stenosis followed by ectasia in both poststenotic areas. This is  followed by origin of a large OM1 with ostial 80% stenosis. OM2 is small with 40% stenosis. At the bifurcation of circumflex and OM1 there is a 70-80% stenosis followed by a distal circumflex which has a 90% stenosis.  Interventional data: Successful Stenting of Proximal and distal circumflex with 4 overlapping stents. 3.0x12, 3.0x16 sandwiched proximal circumflex, mid 2.5x18 and distal 2.5x16 mm DES. The OM1 branch was angioscored with a angiosculpt 2.5 x 10 mm balloon, performed to protect closure after stenting.  Successful Stenting of CTO  RCA. Mid to distal RCA 3.0x38 and proximal 3.0x28 mm DES.      Reproductive/Obstetrics negative OB ROS                            Anesthesia Physical  Anesthesia Plan  ASA: II  Anesthesia Plan: General   Post-op Pain Management:    Induction: Intravenous  PONV Risk Score and Plan: Ondansetron and Treatment may vary due to age or medical condition  Airway Management Planned: Oral ETT  Additional Equipment:   Intra-op Plan:   Post-operative Plan: Extubation in OR  Informed Consent: I have reviewed the patients History and Physical, chart, labs and discussed the procedure including the risks, benefits and alternatives for the proposed anesthesia with the patient or authorized representative who has indicated his/her understanding and acceptance.   Dental advisory given  Plan Discussed with: CRNA and Surgeon  Anesthesia Plan Comments:        Anesthesia Quick Evaluation

## 2017-12-19 NOTE — Op Note (Signed)
Preop diagnosis: Primary right hip osteoarthritis  Postop diagnosis: Same  Procedure: Right total hip arthroplasty direct anterior approach  Surgeon: Rodell Perna, MD  Assistant: Benjiman Core, PA-C medically necessary and present for the entire procedure  Anesthesia: Spinal  EBL: See anesthetic record  Implants:Depuy gripton 82mm cup plus 4 neutral liner, 52X36 poly,  Actis Size 4 stem plus 1.5 neck, apex hole eliminator.  Procedure: After induction of general anesthesia standard prepping and draping with patient on the Hana table C-arm was brought in for visualization.  Both hips are able be visualized with thigh external rotation of 30 degrees showing both lesser trochanters.  Hip was prepped with DuraPrep after 1015 drapes have been applied followed by repeat blue U sticky drapes.  Sterile skin marker for planned incision for direct anterior approach and Betadine Steri-Drape.  Half sheet across and above.  Timeout procedure completed vancomycin was given due to the patient's allergies.  Anterior incision was made.  Patient had a thick layer of adipose tissue before fascia was finally encountered.  Fascia was nicked extended in line with the skin incision and transverse arteries were coagulated.  Dull cover was placed medially over the top of the capsule over the medial neck soft tissue was removed capsule was removed anteriorly and then another cover was placed above the neck.  C arm was brought in for visualization for the neck cut.  Neck cut was made completing the superior aspect with an osteotome protecting the trochanter.  Head was removed with the corkscrew.  Sequential reaming with 90 degree Hohmann placed directly anteriorly over the bone underneath the femoral structures.  Sequential reaming removal of soft tissue labrum was performed progressing up to 51 mm reamer.  Cup did not seated properly we backed off for a re-reamed to 50 mm followed by again a 51 mm and then a 52 cup could be  completely seated.  It was impacted under C arm visualization with good abduction cup flexion.  Hydraulic hook was applied to the femur leg was taken down and under.  Posterior capsule was released Mueller retractor was placed medially over the lesser trochanter and large trochanteric clamp was placed behind the greater trochanter with some difficulty.  Preparation the canal was performed using a box cutter curette chili pepper sequential progression up to the #4 size which gave excellent fit checked under C arm was directly down the canal.  Trial sizes of the neck was performed and restoration of excellent length with a 1.5 mm neck length.  Lavage of the canal followed by placement of the permanent stem placement the permanent ball rechecking neck links AP and lateral spot fluoroscopic pictures showed good fit and fill of the canal.  Hip could be taken to 45 degrees extension and external rotation 90 degrees with good stability of the hip.  There was slight shock.  Leg lengths were equal on fluoroscopic pictures.  The lot closure of the deep fascia 2-0 Vicryl subtenons tissue skin staple closure postop dressing and transferred to recovery room.

## 2017-12-19 NOTE — Anesthesia Postprocedure Evaluation (Signed)
Anesthesia Post Note  Patient: Carolyn Rowland  Procedure(s) Performed: RIGHT TOTAL HIP ARTHROPLASTY ANTERIOR APPROACH (Right Hip)     Patient location during evaluation: PACU Anesthesia Type: Spinal Level of consciousness: awake Pain management: pain level controlled Vital Signs Assessment: post-procedure vital signs reviewed and stable Respiratory status: spontaneous breathing Cardiovascular status: stable Postop Assessment: no headache, no backache, spinal receding, patient able to bend at knees and no apparent nausea or vomiting Anesthetic complications: no    Last Vitals:  Vitals:   12/19/17 1615 12/19/17 1630  BP: 119/67   Pulse: 73 78  Resp: 17 12  Temp:    SpO2: 98%     Last Pain:  Vitals:   12/19/17 1600  TempSrc:   PainSc: 0-No pain   Pain Goal: Patients Stated Pain Goal: 3 (12/19/17 1050)               Avi Kerschner JR,JOHN Mateo Flow

## 2017-12-20 ENCOUNTER — Encounter (HOSPITAL_COMMUNITY): Payer: Self-pay | Admitting: Orthopaedic Surgery

## 2017-12-20 LAB — BASIC METABOLIC PANEL
Anion gap: 7 (ref 5–15)
BUN: 16 mg/dL (ref 6–20)
CO2: 26 mmol/L (ref 22–32)
Calcium: 9 mg/dL (ref 8.9–10.3)
Chloride: 106 mmol/L (ref 101–111)
Creatinine, Ser: 0.8 mg/dL (ref 0.44–1.00)
GFR calc Af Amer: >60 mL/min (ref >60)
GFR calc non Af Amer: >60 mL/min (ref >60)
Glucose, Bld: 138 mg/dL — ABNORMAL HIGH (ref 65–99)
Potassium: 4.2 mmol/L (ref 3.5–5.1)
Sodium: 139 mmol/L (ref 135–145)

## 2017-12-20 LAB — CBC
HCT: 34.4 % — ABNORMAL LOW (ref 36.0–46.0)
Hemoglobin: 11.7 g/dL — ABNORMAL LOW (ref 12.0–15.0)
MCH: 27.1 pg (ref 26.0–34.0)
MCHC: 34 g/dL (ref 30.0–36.0)
MCV: 79.8 fL (ref 78.0–100.0)
Platelets: 156 10*3/uL (ref 150–400)
RBC: 4.31 MIL/uL (ref 3.87–5.11)
RDW: 14.9 % (ref 11.5–15.5)
WBC: 11.7 10*3/uL — ABNORMAL HIGH (ref 4.0–10.5)

## 2017-12-20 MED ORDER — OXYCODONE HCL 5 MG PO TABS
5.0000 mg | ORAL_TABLET | Freq: Four times a day (QID) | ORAL | Status: DC | PRN
  Administered 2017-12-20 – 2017-12-26 (×6): 5 mg via ORAL
  Filled 2017-12-20 (×7): qty 1

## 2017-12-20 NOTE — Progress Notes (Signed)
Physical Therapy Treatment Patient Details Name: Carolyn Rowland MRN: [id number] DOB: 11-25-1940 Today's Date: 12/20/2017    History of Present Illness Pt is a 77 y.o. female s/p R THA direct anterior approach.     PT Comments    Pt with continued c/o nausea and RLE pain. Pt agreeable to exercises upon return to bed from recliner. Exercise handouts provided.   Follow Up Recommendations  Follow surgeon's recommendation for DC plan and follow-up therapies(plan is for HHPT)     Equipment Recommendations  3in1 (PT)    Recommendations for Other Services       Precautions / Restrictions Precautions Precautions: Fall Restrictions Weight Bearing Restrictions: Yes RLE Weight Bearing: Weight bearing as tolerated    Mobility  Bed Mobility Overal bed mobility: Needs Assistance Bed Mobility: Sit to Supine     Supine to sit: Mod assist Sit to supine: Mod assist   General bed mobility comments: +rail, cues for sequencing, increased time to complete  Transfers Overall transfer level: Needs assistance Equipment used: Rolling walker (2 wheeled) Transfers: Stand Pivot Transfers Sit to Stand: Mod assist Stand pivot transfers: Mod assist       General transfer comment: cues for hand placement and sequencing, assist to power up and stabilize initial standing balance  Ambulation/Gait Ambulation/Gait assistance: Min assist Gait Distance (Feet): 3 Feet Assistive device: Rolling walker (2 wheeled) Gait Pattern/deviations: Step-to pattern;Antalgic;Decreased stride length Gait velocity: decreased   General Gait Details: cues for sequencing and posture, distance limited by pain and nausea   Stairs             Wheelchair Mobility    Modified Rankin (Stroke Patients Only)       Balance Overall balance assessment: Needs assistance Sitting-balance support: No upper extremity supported;Feet supported Sitting balance-Leahy Scale: Good     Standing balance support:  Bilateral upper extremity supported;During functional activity Standing balance-Leahy Scale: Poor Standing balance comment: heavy reliance on RW                            Cognition Arousal/Alertness: Awake/alert Behavior During Therapy: WFL for tasks assessed/performed Overall Cognitive Status: Within Functional Limits for tasks assessed                                        Exercises Total Joint Exercises Ankle Circles/Pumps: AROM;Both;10 reps;Supine Quad Sets: AROM;Both;10 reps;Supine Gluteal Sets: AROM;Both;10 reps;Supine Heel Slides: AAROM;Right;10 reps;Supine Hip ABduction/ADduction: AAROM;Right;10 reps;Supine    General Comments General comments (skin integrity, edema, etc.): VSS throughout session      Pertinent Vitals/Pain Pain Assessment: 0-10 Pain Score: 7  Pain Location: R hip during mobility, decreases to 3/10 at rest Pain Descriptors / Indicators: Cramping;Guarding;Grimacing Pain Intervention(s): Limited activity within patient's tolerance;Monitored during session    Home Living Family/patient expects to be discharged to:: Private residence Living Arrangements: Spouse/significant other;Children Available Help at Discharge: Family;Available 24 hours/day Type of Home: House Home Access: Stairs to enter Entrance Stairs-Rails: Left Home Layout: One level Home Equipment: Walker - 2 wheels;Cane - single point      Prior Function Level of Independence: Needs assistance  Gait / Transfers Assistance Needed: cane for ambulation ADL's / Homemaking Assistance Needed: husband/daughter assisted with donning socks and shoes     PT Goals (current goals can now be found in the care plan section) Acute Rehab PT Goals Patient  Stated Goal: feel better PT Goal Formulation: With patient Time For Goal Achievement: 01/03/18 Potential to Achieve Goals: Good Progress towards PT goals: Progressing toward goals    Frequency    7X/week       PT Plan Current plan remains appropriate    Co-evaluation              AM-PAC PT "6 Clicks" Daily Activity  Outcome Measure  Difficulty turning over in bed (including adjusting bedclothes, sheets and blankets)?: Unable Difficulty moving from lying on back to sitting on the side of the bed? : Unable Difficulty sitting down on and standing up from a chair with arms (e.g., wheelchair, bedside commode, etc,.)?: Unable Help needed moving to and from a bed to chair (including a wheelchair)?: A Lot Help needed walking in hospital room?: A Little Help needed climbing 3-5 steps with a railing? : A Lot 6 Click Score: 10    End of Session Equipment Utilized During Treatment: Gait belt Activity Tolerance: Patient tolerated treatment well Patient left: in bed;with call bell/phone within reach Nurse Communication: Mobility status PT Visit Diagnosis: Other abnormalities of gait and mobility (R26.89);Pain;Difficulty in walking, not elsewhere classified (R26.2) Pain - Right/Left: Right Pain - part of body: Hip     Time: 1140-1151 PT Time Calculation (min) (ACUTE ONLY): 11 min  Charges:  $Therapeutic Exercise: 8-22 mins                    G Codes:       Lorrin Goodell, PT  Office # [phone] Pager [phone]    Lorriane Shire 12/20/2017, 11:57 AM

## 2017-12-20 NOTE — Evaluation (Signed)
Physical Therapy Evaluation Patient Details Name: Carolyn Rowland MRN: [id number] DOB: 01-02-1941 Today's Date: 12/20/2017   History of Present Illness  Pt is a 77 y.o. female s/p R THA direct anterior approach.   Clinical Impression  Pt is s/p THA resulting in the deficits listed below (see PT Problem List). On eval pt required mod assist bed mobility, mod assist sit to stand, and min assist ambulation 3 feet with RW. Mobility limited by N&V. Pt positioned in recliner with feet elevated at end of session. Pt will benefit from skilled PT to increase their independence and safety with mobility to allow discharge to the venue listed below.      Follow Up Recommendations Follow surgeon's recommendation for DC plan and follow-up therapies(plan is for HHPT)    Equipment Recommendations  3in1 (PT)    Recommendations for Other Services       Precautions / Restrictions Precautions Precautions: Fall Restrictions RLE Weight Bearing: Weight bearing as tolerated      Mobility  Bed Mobility Overal bed mobility: Needs Assistance Bed Mobility: Supine to Sit     Supine to sit: Mod assist     General bed mobility comments: +rail, cues for sequencing, increased time to complete  Transfers Overall transfer level: Needs assistance Equipment used: Rolling walker (2 wheeled) Transfers: Sit to/from Stand Sit to Stand: Mod assist         General transfer comment: cues for hand placement and sequencing, assist to power up and stabilize initial standing balance  Ambulation/Gait Ambulation/Gait assistance: Min assist Gait Distance (Feet): 3 Feet Assistive device: Rolling walker (2 wheeled) Gait Pattern/deviations: Step-to pattern;Antalgic;Decreased stride length Gait velocity: decreased   General Gait Details: cues for sequencing and posture, distance limited by pain and nausea  Stairs            Wheelchair Mobility    Modified Rankin (Stroke Patients Only)       Balance  Overall balance assessment: Needs assistance Sitting-balance support: No upper extremity supported;Feet supported Sitting balance-Leahy Scale: Good     Standing balance support: Bilateral upper extremity supported;During functional activity Standing balance-Leahy Scale: Poor Standing balance comment: heavy reliance on RW                             Pertinent Vitals/Pain Pain Assessment: 0-10 Pain Score: 7  Pain Location: R hip during mobility, decreases to 3/10 at rest Pain Descriptors / Indicators: Cramping;Guarding;Grimacing Pain Intervention(s): Monitored during session;Limited activity within patient's tolerance;Repositioned    Home Living Family/patient expects to be discharged to:: Private residence Living Arrangements: Spouse/significant other;Children Available Help at Discharge: Family;Available 24 hours/day Type of Home: House Home Access: Stairs to enter Entrance Stairs-Rails: Left Entrance Stairs-Number of Steps: 2 Home Layout: One level Home Equipment: Walker - 2 wheels;Cane - single point      Prior Function Level of Independence: Needs assistance   Gait / Transfers Assistance Needed: cane for ambulation  ADL's / Homemaking Assistance Needed: husband/daughter assisted with donning socks and shoes        Hand Dominance        Extremity/Trunk Assessment   Upper Extremity Assessment Upper Extremity Assessment: Overall WFL for tasks assessed    Lower Extremity Assessment Lower Extremity Assessment: RLE deficits/detail RLE Deficits / Details: expected deficits following hip sx, pain with ROM    Cervical / Trunk Assessment Cervical / Trunk Assessment: Normal  Communication   Communication: No difficulties  Cognition Arousal/Alertness: Lethargic;Suspect  due to medications Behavior During Therapy: Flat affect Overall Cognitive Status: Within Functional Limits for tasks assessed                                         General Comments General comments (skin integrity, edema, etc.): VSS throughout session    Exercises     Assessment/Plan    PT Assessment Patient needs continued PT services  PT Problem List Decreased strength;Decreased mobility;Decreased activity tolerance;Pain;Decreased knowledge of use of DME;Decreased balance       PT Treatment Interventions DME instruction;Therapeutic activities;Gait training;Therapeutic exercise;Patient/family education;Stair training;Balance training;Functional mobility training    PT Goals (Current goals can be found in the Care Plan section)  Acute Rehab PT Goals Patient Stated Goal: feel better PT Goal Formulation: With patient Time For Goal Achievement: 01/03/18 Potential to Achieve Goals: Good    Frequency 7X/week   Barriers to discharge        Co-evaluation               AM-PAC PT "6 Clicks" Daily Activity  Outcome Measure Difficulty turning over in bed (including adjusting bedclothes, sheets and blankets)?: Unable Difficulty moving from lying on back to sitting on the side of the bed? : Unable Difficulty sitting down on and standing up from a chair with arms (e.g., wheelchair, bedside commode, etc,.)?: Unable Help needed moving to and from a bed to chair (including a wheelchair)?: A Lot Help needed walking in hospital room?: A Little Help needed climbing 3-5 steps with a railing? : A Lot 6 Click Score: 10    End of Session Equipment Utilized During Treatment: Gait belt Activity Tolerance: Treatment limited secondary to medical complications (Comment);Patient limited by lethargy(N&V) Patient left: in chair;with call bell/phone within reach Nurse Communication: Mobility status PT Visit Diagnosis: Other abnormalities of gait and mobility (R26.89);Pain;Difficulty in walking, not elsewhere classified (R26.2) Pain - Right/Left: Right Pain - part of body: Hip    Time: 0822-0840 PT Time Calculation (min) (ACUTE ONLY): 18  min   Charges:   PT Evaluation $PT Eval Moderate Complexity: 1 Mod     PT G Codes:        Lorrin Goodell, PT  Office # [phone] Pager [phone]   Lorriane Shire 12/20/2017, 8:52 AM

## 2017-12-20 NOTE — Plan of Care (Signed)
  Problem: Education: Goal: Knowledge of General Education information will improve Outcome: Progressing   Problem: Clinical Measurements: Goal: Ability to maintain clinical measurements within normal limits will improve Outcome: Progressing   Problem: Clinical Measurements: Goal: Will remain free from infection Outcome: Progressing   Problem: Activity: Goal: Risk for activity intolerance will decrease Outcome: Progressing   Problem: Nutrition: Goal: Adequate nutrition will be maintained Outcome: Progressing   Problem: Pain Managment: Goal: General experience of comfort will improve Outcome: Progressing

## 2017-12-20 NOTE — Progress Notes (Signed)
Subjective: Doing well.  Pain controlled.   Some nausea after surgery but better now.   Objective: Vital signs in last 24 hours: Temp:  [97.7 F (36.5 C)-98.1 F (36.7 C)] 97.9 F (36.6 C) (06/18 0453) Pulse Rate:  [73-82] 81 (06/18 1421) Resp:  [12-20] 16 (06/18 1421) BP: (107-146)/(55-90) 136/62 (06/18 1421) SpO2:  [98 %-100 %] 100 % (06/18 1421)  Intake/Output from previous day: 06/17 0701 - 06/18 0700 In: 1919.7 [P.O.:480; I.V.:1039.7; IV Piggyback:250] Out: 3900 [Urine:2800; Emesis/NG output:700; Blood:400] Intake/Output this shift: Total I/O In: 1709.5 [P.O.:340; I.V.:1369.5] Out: -   Recent Labs    12/20/17 0535  HGB 11.7*   Recent Labs    12/20/17 0535  WBC 11.7*  RBC 4.31  HCT 34.4*  PLT 156   Recent Labs    12/20/17 0535  NA 139  K 4.2  CL 106  CO2 26  BUN 16  CREATININE 0.80  GLUCOSE 138*  CALCIUM 9.0   No results for input(s): LABPT, INR in the last 72 hours.   Exam: Pleasant female.  Alert and oriented.  NAD.  Slight bleeding through dressing.  bilat calves nontender.  NVI.       Assessment/Plan: D/c dilaudid.  Continue PT.  Anticipate d/c home next 1-2 days if making good progress.     Benjiman Core 12/20/2017, 3:47 PM

## 2017-12-21 ENCOUNTER — Inpatient Hospital Stay (HOSPITAL_COMMUNITY): Payer: Medicare HMO

## 2017-12-21 DIAGNOSIS — M79609 Pain in unspecified limb: Secondary | ICD-10-CM

## 2017-12-21 DIAGNOSIS — Z9889 Other specified postprocedural states: Secondary | ICD-10-CM

## 2017-12-21 LAB — CBC
HCT: 30 % — ABNORMAL LOW (ref 36.0–46.0)
Hemoglobin: 10.2 g/dL — ABNORMAL LOW (ref 12.0–15.0)
MCH: 26.9 pg (ref 26.0–34.0)
MCHC: 34 g/dL (ref 30.0–36.0)
MCV: 79.2 fL (ref 78.0–100.0)
Platelets: 115 10*3/uL — ABNORMAL LOW (ref 150–400)
RBC: 3.79 MIL/uL — ABNORMAL LOW (ref 3.87–5.11)
RDW: 14.9 % (ref 11.5–15.5)
WBC: 12.5 10*3/uL — ABNORMAL HIGH (ref 4.0–10.5)

## 2017-12-21 NOTE — Progress Notes (Signed)
PT Cancellation Note  Patient Details Name: LOYD SALVADOR MRN: [id number] DOB: [birth year]   Cancelled Treatment:    Reason Eval/Treat Not Completed: Other (comment). Awaiting venous doppler to r/o DVT.   Mabeline Caras, PT, DPT Acute Rehab Services  Pager: [place] 12/21/2017, 2:37 PM

## 2017-12-21 NOTE — Evaluation (Signed)
Occupational Therapy Evaluation Patient Details Name: Carolyn Rowland MRN: [id number] DOB: 13-Aug-1940 Today's Date: 12/21/2017    History of Present Illness Pt is a 77 y.o. female s/p R THA direct anterior approach.    Clinical Impression   Pt reports she required minimal assist with ADL PTA. Currently pt able to perform sit to stand with min assist; mobility limited by pain and difficulty performing step with R leg. Pt requires min assist for UB ADL in sitting and max assist for LB ADL. Pt planning to d/c home with 24/7 supervision from family. Recommending HHOT for follow up to maximize independence and safety with ADL and functional mobility upon return home. Pt would benefit from continued skilled OT to address established goals.    Follow Up Recommendations  Home health OT;Supervision/Assistance - 24 hour    Equipment Recommendations  3 in 1 bedside commode;Tub/shower bench    Recommendations for Other Services       Precautions / Restrictions Precautions Precautions: Fall Restrictions Weight Bearing Restrictions: Yes RLE Weight Bearing: Weight bearing as tolerated      Mobility Bed Mobility               General bed mobility comments: Pt OOB in chair upon arrival  Transfers Overall transfer level: Needs assistance Equipment used: Rolling walker (2 wheeled) Transfers: Sit to/from Stand Sit to Stand: Min assist         General transfer comment: Cues for hand placement and technique. Min assist to boost up from chair. Significant increased time and effort    Balance Overall balance assessment: Needs assistance Sitting-balance support: Feet supported Sitting balance-Leahy Scale: Good     Standing balance support: Bilateral upper extremity supported Standing balance-Leahy Scale: Poor Standing balance comment: RW for support                           ADL either performed or assessed with clinical judgement   ADL Overall ADL's : Needs  assistance/impaired Eating/Feeding: Set up;Sitting   Grooming: Set up;Supervision/safety;Sitting   Upper Body Bathing: Minimal assistance;Sitting   Lower Body Bathing: Maximal assistance;Sit to/from stand   Upper Body Dressing : Minimal assistance;Sitting   Lower Body Dressing: Maximal assistance;Sit to/from stand                 General ADL Comments: Min assist for sit to stand from chair; pt with difficulty stepping with R leg only able to perform 2 steps forward from chair.     Vision         Perception     Praxis      Pertinent Vitals/Pain Pain Assessment: 0-10 Pain Score: 7  Pain Location: R hip Pain Descriptors / Indicators: Aching;Grimacing;Guarding Pain Intervention(s): Limited activity within patient's tolerance;Monitored during session;Ice applied     Hand Dominance     Extremity/Trunk Assessment Upper Extremity Assessment Upper Extremity Assessment: Overall WFL for tasks assessed   Lower Extremity Assessment Lower Extremity Assessment: Defer to PT evaluation       Communication Communication Communication: No difficulties   Cognition Arousal/Alertness: Awake/alert Behavior During Therapy: WFL for tasks assessed/performed Overall Cognitive Status: Within Functional Limits for tasks assessed                                     General Comments       Exercises     Shoulder  Instructions      Home Living Family/patient expects to be discharged to:: Private residence Living Arrangements: Spouse/significant other;Children Available Help at Discharge: Family;Available 24 hours/day Type of Home: House Home Access: Stairs to enter [employer] of Steps: 2 Entrance Stairs-Rails: Left Home Layout: One level     Bathroom Shower/Tub: Tub/shower unit;Curtain   Biochemist, clinical: Standard     Home Equipment: Environmental consultant - 2 wheels;Cane - single point;Grab bars - tub/shower          Prior Functioning/Environment Level  of Independence: Needs assistance  Gait / Transfers Assistance Needed: cane for ambulation ADL's / Homemaking Assistance Needed: husband/daughter assisted with donning socks and shoes            OT Problem List: Decreased strength;Decreased range of motion;Decreased activity tolerance;Impaired balance (sitting and/or standing);Decreased knowledge of use of DME or AE;Decreased knowledge of precautions;Obesity;Pain;Increased edema      OT Treatment/Interventions: Self-care/ADL training;Therapeutic exercise;Energy conservation;DME and/or AE instruction;Therapeutic activities;Patient/family education;Balance training    OT Goals(Current goals can be found in the care plan section) Acute Rehab OT Goals Patient Stated Goal: decrease pain OT Goal Formulation: With patient Time For Goal Achievement: 01/04/18 Potential to Achieve Goals: Good ADL Goals Pt Will Perform Lower Body Bathing: with min assist;sit to/from stand Pt Will Perform Lower Body Dressing: with min assist;sit to/from stand Pt Will Transfer to Toilet: with min guard assist;ambulating;bedside commode Pt Will Perform Toileting - Clothing Manipulation and hygiene: with min guard assist;sit to/from stand Pt Will Perform Tub/Shower Transfer: with min guard assist;Tub transfer;ambulating;tub bench;3 in 1;rolling walker  OT Frequency: Min 2X/week   Barriers to D/C:            Co-evaluation              AM-PAC PT "6 Clicks" Daily Activity     Outcome Measure Help from another person eating meals?: None Help from another person taking care of personal grooming?: A Little Help from another person toileting, which includes using toliet, bedpan, or urinal?: A Lot Help from another person bathing (including washing, rinsing, drying)?: A Lot Help from another person to put on and taking off regular upper body clothing?: A Little Help from another person to put on and taking off regular lower body clothing?: A Lot 6 Click  Score: 16   End of Session Equipment Utilized During Treatment: Rolling walker Nurse Communication: Patient requests pain meds;Mobility status  Activity Tolerance: Patient limited by pain Patient left: in chair;with call bell/phone within reach  OT Visit Diagnosis: Unsteadiness on feet (R26.81);Other abnormalities of gait and mobility (R26.89);Pain Pain - Right/Left: Right Pain - part of body: Hip                Time: 7322-0254 OT Time Calculation (min): 15 min Charges:  OT General Charges $OT Visit: 1 Visit OT Evaluation $OT Eval Moderate Complexity: 1 Mod G-Codes:     Mazey Mantell A. Ulice Brilliant, M.S., OTR/L Acute Rehab Department: [phone]  Binnie Kand 12/21/2017, 9:32 AM

## 2017-12-21 NOTE — Care Management Note (Signed)
Case Management Note  Patient Details  Name: CHRISANN MELARAGNO MRN: [id number] Date of Birth: 1940-08-17  Subjective/Objective:    77 yr old female s/p right total hip arthroplasty.                 Action/Plan: Case manager spoke with patient concerning discharge plan and DME. Choice for Home Health agency was offered, referral was called to [place] [place] [facility]iaison.  Patient will have family support at discharge.    Expected Discharge Date:    12/22/17              Expected Discharge Plan:  [place]  In-House Referral:  NA  Discharge planning Services  CM Consult  Post Acute Care Choice:  Durable Medical Equipment, Home Health Choice offered to:  Patient  DME Arranged:  3-N-1(has RW) DME Agency:  [place] Arranged:  PT, OT [facility] Agency:  [place]  Status of Service:  Completed, signed off  If discussed at [place] of Stay Meetings, dates discussed:    Additional Comments:  Ninfa Meeker, RN 12/21/2017, 2:24 PM

## 2017-12-21 NOTE — Progress Notes (Addendum)
Physical Therapy Treatment Patient Details Name: Carolyn Rowland MRN: [id number] DOB: Apr 21, 1941 Today's Date: 12/21/2017    History of Present Illness Pt is a 77 y.o. female s/p R THA (direct anterior approach) on 12/20/17. PMH includes HTN, CAD, vertigo.   PT Comments    Pt slowly progressing with mobility. Remains limited by c/o significant R hip pain and continued RLE weakness. Pt required minA to clear R foot while wearing yellow socks; able to take complete step once wearing tennis shoes, but only able to walk 15' with RW and min guard before needing to sit due to pain, fatigue, and nausea. Significant time and effort for all mobility. Pt remains limited with all functional mobility; would benefit from continued acute PT sessions prior to return home.   If to discharge today, will require wheelchair for even household distances.   Follow Up Recommendations  Follow surgeon's recommendation for DC plan and follow-up therapies;Supervision/Assistance - 24 hour     Equipment Recommendations  3in1 (PT)    Recommendations for Other Services       Precautions / Restrictions Precautions Precautions: Fall Restrictions Weight Bearing Restrictions: Yes RLE Weight Bearing: Weight bearing as tolerated    Mobility  Bed Mobility Overal bed mobility: Needs Assistance Bed Mobility: Sit to Supine       Sit to supine: Mod assist   General bed mobility comments: ModA to assist RLE back into supine; cues for sequencing with bed mobility  Transfers Overall transfer level: Needs assistance Equipment used: Rolling walker (2 wheeled) Transfers: Sit to/from Stand Sit to Stand: Min guard         General transfer comment: Stood 3x with RW and min guard for balance; increased time and effort, but no physical assist required  Ambulation/Gait Ambulation/Gait assistance: Min guard;Min assist Gait Distance (Feet): 15 Feet Assistive device: Rolling walker (2 wheeled) Gait  Pattern/deviations: Step-to pattern;Decreased stride length;Decreased weight shift to right;Antalgic;Trunk flexed;Decreased dorsiflexion - right Gait velocity: decreased Gait velocity interpretation: <1.31 ft/sec, indicative of household ambulator General Gait Details: Initially attempting to amb with yellow socks on, but pt unable to flex hip/extend knee/DF ankle on RLE enough to clear R foot, requiring minA to lift foot. Donned tennis shoes and able to amb 15' in room with RW and min guard; improved ability to clear R foot, but still significant increased time and effort. Pt c/o fatigue, pain, and dizziness limiting further mobility   Stairs             Wheelchair Mobility    Modified Rankin (Stroke Patients Only)       Balance Overall balance assessment: Needs assistance Sitting-balance support: Feet supported Sitting balance-Leahy Scale: Good     Standing balance support: Bilateral upper extremity supported Standing balance-Leahy Scale: Poor Standing balance comment: RW for support                            Cognition Arousal/Alertness: Awake/alert Behavior During Therapy: Flat affect Overall Cognitive Status: Within Functional Limits for tasks assessed                                        Exercises      General Comments        Pertinent Vitals/Pain Pain Assessment: 0-10 Pain Score: 7 (2/10 rest, 7/10 movement) Pain Location: R hip Pain Descriptors / Indicators: Aching;Grimacing;Guarding Pain  Intervention(s): Limited activity within patient's tolerance;Monitored during session;Premedicated before session    [place] expects to be discharged to:: Private residence Living Arrangements: Spouse/significant other;Children Available Help at Discharge: Family;Available 24 hours/day Type of Home: House Home Access: Stairs to enter Entrance Stairs-Rails: Left Home Layout: One level Home Equipment: Walker - 2  wheels;Cane - single point;Grab bars - tub/shower      Prior Function Level of Independence: Needs assistance  Gait / Transfers Assistance Needed: cane for ambulation ADL's / Homemaking Assistance Needed: husband/daughter assisted with donning socks and shoes     PT Goals (current goals can now be found in the care plan section) Acute Rehab PT Goals Patient Stated Goal: decrease pain PT Goal Formulation: With patient Time For Goal Achievement: 01/03/18 Potential to Achieve Goals: Good Progress towards PT goals: Progressing toward goals    Frequency    7X/week      PT Plan Current plan remains appropriate    Co-evaluation              AM-PAC PT "6 Clicks" Daily Activity  Outcome Measure  Difficulty turning over in bed (including adjusting bedclothes, sheets and blankets)?: Unable Difficulty moving from lying on back to sitting on the side of the bed? : Unable Difficulty sitting down on and standing up from a chair with arms (e.g., wheelchair, bedside commode, etc,.)?: A Little Help needed moving to and from a bed to chair (including a wheelchair)?: A Little Help needed walking in hospital room?: A Lot Help needed climbing 3-5 steps with a railing? : A Lot 6 Click Score: 12    End of Session Equipment Utilized During Treatment: Gait belt Activity Tolerance: Patient limited by pain Patient left: in bed;with call bell/phone within reach;with bed alarm set Nurse Communication: Mobility status PT Visit Diagnosis: Other abnormalities of gait and mobility (R26.89);Pain;Difficulty in walking, not elsewhere classified (R26.2) Pain - Right/Left: Right Pain - part of body: Hip     Time: 1607-3710 PT Time Calculation (min) (ACUTE ONLY): 29 min  Charges:  $Gait Training: 8-22 mins $Therapeutic Activity: 8-22 mins                    G Codes:      Mabeline Caras, PT, DPT Acute Rehab Services  Pager: [place] 12/21/2017, 10:52 AM

## 2017-12-21 NOTE — Progress Notes (Signed)
*  Preliminary Results* Right lower extremity venous duplex completed. Right lower extremity is negative for deep vein thrombosis. There is no evidence of right Baker's cyst.  12/21/2017 4:37 PM  Maudry Mayhew, BS, RVT, RDCS, RDMS

## 2017-12-21 NOTE — Progress Notes (Signed)
Subjective: Patient c/o right calf and leg pain.  Swelling LE.  No c/o chest pain, sob.    Objective: Vital signs in last 24 hours: Temp:  [98.3 F (36.8 C)-100.3 F (37.9 C)] 98.3 F (36.8 C) (06/19 1233) Pulse Rate:  [89-97] 97 (06/19 1233) Resp:  [15-18] 18 (06/19 1233) BP: (112-131)/(52-61) 112/60 (06/19 1233) SpO2:  [94 %-100 %] 100 % (06/19 1233)  Intake/Output from previous day: 06/18 0701 - 06/19 0700 In: 1949.5 [P.O.:580; I.V.:1369.5] Out: -  Intake/Output this shift: Total I/O In: 240 [P.O.:240] Out: -   Recent Labs    12/20/17 0535 12/21/17 0442  HGB 11.7* 10.2*   Recent Labs    12/20/17 0535 12/21/17 0442  WBC 11.7* 12.5*  RBC 4.31 3.79*  HCT 34.4* 30.0*  PLT 156 115*   Recent Labs    12/20/17 0535  NA 139  K 4.2  CL 106  CO2 26  BUN 16  CREATININE 0.80  GLUCOSE 138*  CALCIUM 9.0   No results for input(s): LABPT, INR in the last 72 hours.  Exam: Pleasant, alert and oriented.  NAD.  Marked right calf TTP with swelling.  Tender posterior knee.  NVI.        Assessment/Plan: Moving slow with PT due to c/o right LE pain.  Will get stat venous doppler to r/o DVT.    Carolyn Rowland 12/21/2017, 2:27 PM

## 2017-12-21 NOTE — Progress Notes (Addendum)
   Subjective: 2 Days Post-Op Procedure(s) (LRB): RIGHT TOTAL HIP ARTHROPLASTY ANTERIOR APPROACH (Right) Patient reports pain as moderate.    Objective: Vital signs in last 24 hours: Temp:  [99.3 F (37.4 C)-100.3 F (37.9 C)] 99.3 F (37.4 C) (06/19 0449) Pulse Rate:  [81-91] 89 (06/19 0449) Resp:  [15-18] 18 (06/19 0449) BP: (117-136)/(52-62) 120/52 (06/19 0449) SpO2:  [94 %-100 %] 95 % (06/19 0449)  Intake/Output from previous day: 06/18 0701 - 06/19 0700 In: 1949.5 [P.O.:580; I.V.:1369.5] Out: -  Intake/Output this shift: No intake/output data recorded.  Recent Labs    12/20/17 0535 12/21/17 0442  HGB 11.7* 10.2*   Recent Labs    12/20/17 0535 12/21/17 0442  WBC 11.7* 12.5*  RBC 4.31 3.79*  HCT 34.4* 30.0*  PLT 156 115*   Recent Labs    12/20/17 0535  NA 139  K 4.2  CL 106  CO2 26  BUN 16  CREATININE 0.80  GLUCOSE 138*  CALCIUM 9.0   No results for input(s): LABPT, INR in the last 72 hours.  Neurologically intact No results found.  Assessment/Plan: 2 Days Post-Op Procedure(s) (LRB): RIGHT TOTAL HIP ARTHROPLASTY ANTERIOR APPROACH (Right) Up with therapy, dressing change, possible home this afternoon. Is making slow progress.   Marybelle Killings 12/21/2017, 7:47 AM

## 2017-12-22 ENCOUNTER — Other Ambulatory Visit: Payer: Self-pay

## 2017-12-22 LAB — CBC
HCT: 28 % — ABNORMAL LOW (ref 36.0–46.0)
Hemoglobin: 9.7 g/dL — ABNORMAL LOW (ref 12.0–15.0)
MCH: 27.2 pg (ref 26.0–34.0)
MCHC: 34.6 g/dL (ref 30.0–36.0)
MCV: 78.7 fL (ref 78.0–100.0)
Platelets: 105 10*3/uL — ABNORMAL LOW (ref 150–400)
RBC: 3.56 MIL/uL — ABNORMAL LOW (ref 3.87–5.11)
RDW: 15.1 % (ref 11.5–15.5)
WBC: 9.8 10*3/uL (ref 4.0–10.5)

## 2017-12-22 NOTE — Progress Notes (Signed)
Occupational Therapy Treatment Patient Details Name: Carolyn Rowland MRN: [id number] DOB: 1941/03/24 Today's Date: 12/22/2017    History of present illness Pt is a 77 y.o. female s/p R THA (direct anterior approach) on 12/20/17. PMH includes HTN, CAD, vertigo.   OT comments  Patient progressing well.  Reports nausea this morning, but has subsided.  Patient continues to require increased time for functional mobility and transfers, with increased difficulty progressing R LE forward during mobility.  She demonstrates ability to complete toilet transfer with min assist using grab bars, would benefit from placement of 3 in 1 over commode to push up from.  Cueing throughout session for hand placement safety and walker management (closer to body).  Patient presents with anxiety initially, but fades during session. She reports her husband will be home with her 24/7 to assist, but is unsure if he will be able to assist her at dc.  Recommendation for SNF rehab in order to maximize independence in ADLs, strength and mobility prior to dc home with family support.    Follow Up Recommendations  Supervision/Assistance - 24 hour;Follow surgeon's recommendation for DC plan and follow-up therapies;SNF    Equipment Recommendations  3 in 1 bedside commode;Tub/shower bench    Recommendations for Other Services      Precautions / Restrictions Precautions Precautions: Fall Restrictions Weight Bearing Restrictions: Yes RLE Weight Bearing: Weight bearing as tolerated       Mobility Bed Mobility               General bed mobility comments: seated in recliner upon entry   Transfers Overall transfer level: Needs assistance Equipment used: Rolling walker (2 wheeled) Transfers: Sit to/from Stand Sit to Stand: Min guard Stand pivot transfers: Min guard       General transfer comment: increased time for transfers, cueing for safe hand placement during transitions     Balance Overall balance  assessment: Needs assistance Sitting-balance support: Feet supported Sitting balance-Leahy Scale: Good     Standing balance support: Bilateral upper extremity supported Standing balance-Leahy Scale: Poor Standing balance comment: reliant on B UE support using RW or counter                            ADL either performed or assessed with clinical judgement   ADL Overall ADL's : Needs assistance/impaired     Grooming: Wash/dry hands;Standing;Cueing for safety;Min guard Grooming Details (indicate cue type and reason): cueing for safety and positioning, patient leaning on elbows                 Toilet Transfer: Comfort height toilet;Grab bars;RW;Ambulation;Minimal assistance Toilet Transfer Details (indicate cue type and reason): cueing for hand placement and safety, using grab bars and therapist assist for safety transition  Toileting- Clothing Manipulation and Hygiene: Cueing for safety;Sit to/from stand;Min guard       Functional mobility during ADLs: Minimal assistance;Rolling walker;Cueing for safety General ADL Comments: min assist for sit to stand from recliner, increased time for forward progression of R LE; func mobility into /out of restroom using RW (VCing for safety, Production manager)      Vision       Perception     Praxis      Cognition Arousal/Alertness: Awake/alert Behavior During Therapy: Flat affect;Anxious Overall Cognitive Status: Within Functional Limits for tasks assessed  General Comments: WFL, anxious initally and fading during session; requires increased time for tasks         Exercises     Shoulder Instructions       General Comments VSS     Pertinent Vitals/ Pain       Faces Pain Scale: Hurts little more Pain Location: R hip Pain Descriptors / Indicators: Aching;Grimacing;Guarding Pain Intervention(s): Limited activity within patient's tolerance;Monitored during  session;Repositioned  Home Living                                          Prior Functioning/Environment              Frequency  Min 2X/week        Progress Toward Goals  OT Goals(current goals can now be found in the care plan section)  Progress towards OT goals: Progressing toward goals  Acute Rehab OT Goals Patient Stated Goal: decrease pain OT Goal Formulation: With patient Time For Goal Achievement: 01/04/18 Potential to Achieve Goals: Good  Plan Discharge plan remains appropriate;Frequency remains appropriate    Co-evaluation                 AM-PAC PT "6 Clicks" Daily Activity     Outcome Measure   Help from another person eating meals?: None Help from another person taking care of personal grooming?: A Little Help from another person toileting, which includes using toliet, bedpan, or urinal?: A Lot Help from another person bathing (including washing, rinsing, drying)?: A Lot Help from another person to put on and taking off regular upper body clothing?: A Little Help from another person to put on and taking off regular lower body clothing?: A Lot 6 Click Score: 16    End of Session Equipment Utilized During Treatment: Rolling walker;Gait belt  OT Visit Diagnosis: Unsteadiness on feet (R26.81);Other abnormalities of gait and mobility (R26.89);Pain Pain - Right/Left: Right Pain - part of body: Hip   Activity Tolerance Patient tolerated treatment well;No increased pain   Patient Left with call bell/phone within reach;Other (comment)(EOB, hand off to PT )   Nurse Communication          Time: [id number] OT Time Calculation (min): 16 min  Charges: OT General Charges $OT Visit: 1 Visit OT Treatments $Self Care/Home Management : 8-22 mins  Delight Stare, OTR/L  Pager [place] 12/22/2017, 3:44 PM

## 2017-12-22 NOTE — Clinical Social Work Note (Signed)
Clinical Social Work Assessment  Patient Details  Name: Carolyn Rowland MRN: [id number] Date of Birth: [birth year]  Date of referral:  12/22/17               Reason for consult:  Facility Placement                Permission sought to share information with:  Facility Art therapist granted to share information::  Yes, Verbal Permission Granted  Name::        Agency::  SNF  Relationship::     Contact Information:     Housing/Transportation Living arrangements for the past 2 months:  Single Family Home Source of Information:  Patient Patient Interpreter Needed:  None Criminal Activity/Legal Involvement Pertinent to Current Situation/Hospitalization:  No - Comment as needed Significant Relationships:  Adult Children, Spouse, Other Family Members Lives with:  Adult Children, Spouse Do you feel safe going back to the place where you live?  No Need for family participation in patient care:  No (Coment)  Care giving concerns:  Pt from home with spouse and will need skilled nursing.  Social Worker assessment / plan:  CSW met with patient at bedside to discuss SNF recommendations. Pt has no experience with SNF. CSW explained SNF process, placement and Insurance auth. Pt desires to return home and understands that she is making slow progress. Pt indicated that she was independent with ADL's at home with discomfort.  She agreed for CSW to send referral to [employer] CSW will f/u on SNF offers.  CSW will f/u for disposition.  Employment status:  Retired Nurse, adult PT Recommendations:  [place] / Referral to community resources:  [place]  Patient/Family's Response to care:  Patient appreciative.  Patient/Family's Understanding of and Emotional Response to Diagnosis, Current Treatment, and Prognosis:  Pt has good understanding of impairment and was initially hoping to return home with family.  Given her slow progress, she appears agreeable to SNF at this time. CSW will f/u SNF offers and have SNF initiate Insurance Offers.  Emotional Assessment Appearance:  Appears stated age Attitude/Demeanor/Rapport:  (Cooperative) Affect (typically observed):  Accepting, Appropriate Orientation:  Oriented to Situation, Oriented to  Time, Oriented to Place, Oriented to Self Alcohol / Substance use:  Not Applicable Psych involvement (Current and /or in the community):  No (Comment)  Discharge Needs  Concerns to be addressed:  Discharge Planning Concerns Readmission within the last 30 days:  No Current discharge risk:  Dependent with Mobility, Physical Impairment Barriers to Discharge:  No Barriers Identified   Normajean Baxter, LCSW 12/22/2017, 4:21 PM

## 2017-12-22 NOTE — Progress Notes (Addendum)
Physical Therapy Treatment Patient Details Name: Carolyn Rowland MRN: [id number] DOB: Apr 03, 1941 Today's Date: 12/22/2017    History of Present Illness Pt is a 77 y.o. female s/p R THA (direct anterior approach) on 12/20/17. PMH includes HTN, CAD, vertigo.   PT Comments    Today's session focused on bed mobility and seated/supine LE therex, as pt fatigued having just ambulated with OT. ModA to assist RLE with bed mobility; significant increased time and effort for all movement. R hip ROM limited by c/o pain. Hip flexion and knee extension <3/5 strength; continues to have difficulty advancing RLE while walking due to this weakness; at significant risk for falls. Discussed recommendation for short-term SNF-level therapies to maximize functional mobility and independence prior to return home; pt in agreement with this. Will continue to follow acutely.   Follow Up Recommendations  Follow surgeon's recommendation for DC plan and follow-up therapies;SNF;Supervision/Assistance - 24 hour     Equipment Recommendations       Recommendations for Other Services Rehab consult     Precautions / Restrictions Precautions Precautions: Fall Restrictions Weight Bearing Restrictions: Yes RLE Weight Bearing: Weight bearing as tolerated    Mobility  Bed Mobility Overal bed mobility: Needs Assistance Bed Mobility: Sit to Supine       Sit to supine: Mod assist   General bed mobility comments: Seated at EOB with OT upon arrival. ModA to assist RLE back into bed; significant increased time and effort to reposition once supine  Transfers Overall transfer level: Needs assistance Equipment used: Rolling walker (2 wheeled) Transfers: Sit to/from Stand Sit to Stand: Min guard Stand pivot transfers: Min guard       General transfer comment: increased time for transfers, cueing for safe hand placement during transitions   Ambulation/Gait                 Stairs             Wheelchair  Mobility    Modified Rankin (Stroke Patients Only)       Balance Overall balance assessment: Needs assistance Sitting-balance support: Feet supported Sitting balance-Leahy Scale: Fair Sitting balance - Comments: Unable to reach feet seated EOB   Standing balance support: Bilateral upper extremity supported Standing balance-Leahy Scale: Poor Standing balance comment: reliant on B UE support using RW or counter                             Cognition Arousal/Alertness: Awake/alert Behavior During Therapy: Flat affect Overall Cognitive Status: Within Functional Limits for tasks assessed                                 General Comments: WFL, but intermittently slow to respond and follow commands; increased time for tasks      Exercises Total Joint Exercises Ankle Circles/Pumps: AROM;Both;10 reps;Supine Quad Sets: AROM;Both;10 reps;Supine Short Arc Quad: AROM;Right;10 reps;Supine Heel Slides: AAROM;Right;10 reps;Supine Hip ABduction/ADduction: AAROM;Right;10 reps;Supine Long Arc Quad: AAROM;Right;10 reps;Supine(significant time and effort attempting LAQ)    General Comments General comments (skin integrity, edema, etc.): VSS       Pertinent Vitals/Pain Pain Assessment: Faces Faces Pain Scale: Hurts even more Pain Location: R hip with therex/ROM Pain Descriptors / Indicators: Aching;Grimacing;Guarding Pain Intervention(s): Monitored during session;Limited activity within patient's tolerance    Home Living  Prior Function            PT Goals (current goals can now be found in the care plan section) Acute Rehab PT Goals Patient Stated Goal: decrease pain PT Goal Formulation: With patient Time For Goal Achievement: 01/03/18 Potential to Achieve Goals: Fair Progress towards PT goals: Progressing toward goals    Frequency           PT Plan Discharge plan needs to be updated    Co-evaluation               AM-PAC PT "6 Clicks" Daily Activity  Outcome Measure  Difficulty turning over in bed (including adjusting bedclothes, sheets and blankets)?: Unable Difficulty moving from lying on back to sitting on the side of the bed? : Unable Difficulty sitting down on and standing up from a chair with arms (e.g., wheelchair, bedside commode, etc,.)?: A Little Help needed moving to and from a bed to chair (including a wheelchair)?: A Little Help needed walking in hospital room?: A Lot Help needed climbing 3-5 steps with a railing? : Total 6 Click Score: 11    End of Session Equipment Utilized During Treatment: Gait belt Activity Tolerance: Patient tolerated treatment well;Patient limited by pain Patient left: in bed;with call bell/phone within reach Nurse Communication: Mobility status PT Visit Diagnosis: Other abnormalities of gait and mobility (R26.89);Pain;Difficulty in walking, not elsewhere classified (R26.2) Pain - Right/Left: Right Pain - part of body: Hip     Time: 2947-6546 PT Time Calculation (min) (ACUTE ONLY): 22 min  Charges:  $Therapeutic Exercise: 8-22 mins                    G Codes:      Mabeline Caras, PT, DPT Acute Rehab Services  Pager: [place] 12/22/2017, 4:04 PM

## 2017-12-22 NOTE — NC FL2 (Signed)
[place] MEDICAID FL2 LEVEL OF CARE SCREENING TOOL     IDENTIFICATION  Patient Name: Carolyn Rowland: Jul 17, 1940 Sex: female Admission Date (Current Location): 12/19/2017  [facility] and [place] Number:  Herbalist and Address:  [facility], [place] [address] [place], [place] [address]      Provider Number: [id number]  Attending Physician Name and Address:  Marybelle Killings, MD  Relative Name and Phone Number:  Renaye Janicki, spouse, [phone]    Current Level of Care: Hospital Recommended Level of Care: [place] Prior Approval Number:    Date Approved/Denied:   PASRR Number: [id number]  Discharge Plan: SNF    Current Diagnoses: Patient Active Problem List   Diagnosis Date Noted  . Arthritis of right hip 12/19/2017  . Spinal stenosis of lumbosacral region 09/22/2017  . Bilateral primary osteoarthritis of hip 09/22/2017  . Unilateral primary osteoarthritis, right hip 03/16/2017  . CAP (community acquired pneumonia) 09/04/2014  . Leukocytosis 09/04/2014  . Hypokalemia 09/04/2014  . Breast cancer of upper-outer quadrant of right female breast ([place]) 08/27/2014  . Coronary atherosclerosis of native coronary artery 08/18/2013  . S/P PTCA (percutaneous transluminal coronary angioplasty) 08/17/2013    Orientation RESPIRATION BLADDER Height & Weight     Self, Time, Situation, Place  Normal Continent Weight: 210 lb (95.3 kg) Height:  5\' 3"  (160 cm)  BEHAVIORAL SYMPTOMS/MOOD NEUROLOGICAL BOWEL NUTRITION STATUS      Continent Diet(See DC Summary)  AMBULATORY STATUS COMMUNICATION OF NEEDS Skin   Limited Assist Verbally Surgical wounds                       Personal Care Assistance Level of Assistance  Bathing, Feeding, Dressing Bathing Assistance: Limited assistance Feeding assistance: Limited assistance Dressing Assistance: Limited assistance     Functional Limitations Info  Sight, Hearing, Speech Sight  Info: Adequate Hearing Info: Adequate Speech Info: Adequate    SPECIAL CARE FACTORS FREQUENCY  PT (By licensed PT), OT (By licensed OT)     PT Frequency: 7x week OT Frequency: 2x week            Contractures Contractures Info: Not present    Additional Factors Info  Code Status, Allergies Code Status Info: Full Allergies Info: Penicilins           Current Medications (12/22/2017):  This is the current hospital active medication list Current Facility-Administered Medications  Medication Dose Route Frequency Provider Last Rate Last Dose  . 0.9 %  sodium chloride infusion   Intravenous Continuous Lanae Crumbly, PA-C   Stopped at 12/20/17 2127  . acetaminophen (TYLENOL) tablet 325-650 mg  325-650 mg Oral Q6H PRN Lanae Crumbly, PA-C   650 mg at 12/22/17 0867  . amLODipine (NORVASC) tablet 2.5 mg  2.5 mg Oral Daily Lanae Crumbly, PA-C   2.5 mg at 12/21/17 1213  . anastrozole (ARIMIDEX) tablet 1 mg  1 mg Oral Daily Lanae Crumbly, PA-C   1 mg at 12/21/17 6195  . aspirin EC tablet 325 mg  325 mg Oral Q breakfast Lanae Crumbly, PA-C   325 mg at 12/21/17 0932  . cholecalciferol (VITAMIN D) tablet 400 Units  400 Units Oral Daily Lanae Crumbly, PA-C   400 Units at 12/21/17 6712  . docusate sodium (COLACE) capsule 100 mg  100 mg Oral BID Lanae Crumbly, PA-C   100 mg at 12/21/17 2110  . isosorbide mononitrate (IMDUR) 24  hr tablet 60 mg  60 mg Oral Daily Lanae Crumbly, PA-C   60 mg at 12/21/17 0915  . lactated ringers infusion   Intravenous Continuous Hatchett, Mateo Flow, MD      . menthol-cetylpyridinium (CEPACOL) lozenge 3 mg  1 lozenge Oral PRN Lanae Crumbly, PA-C       Or  . phenol (CHLORASEPTIC) mouth spray 1 spray  1 spray Mouth/Throat PRN Lanae Crumbly, PA-C      . methocarbamol (ROBAXIN) tablet 500 mg  500 mg Oral Q6H PRN Lanae Crumbly, PA-C   500 mg at 12/22/17 1751   Or  . methocarbamol (ROBAXIN) 500 mg in dextrose 5 % 50 mL IVPB  500 mg Intravenous Q6H PRN Lanae Crumbly, PA-C      . metoCLOPramide (REGLAN) tablet 5-10 mg  5-10 mg Oral Q8H PRN Lanae Crumbly, PA-C       Or  . metoCLOPramide (REGLAN) injection 5-10 mg  5-10 mg Intravenous Q8H PRN Lanae Crumbly, PA-C   10 mg at 12/20/17 0656  . metoprolol succinate (TOPROL-XL) 24 hr tablet 50 mg  50 mg Oral Daily Benjiman Core M, PA-C   50 mg at 12/21/17 1213  . metroNIDAZOLE (FLAGYL) tablet 500 mg  500 mg Oral TID Lanae Crumbly, PA-C   500 mg at 12/21/17 2110  . nitroGLYCERIN (NITROSTAT) SL tablet 0.4 mg  0.4 mg Sublingual Q5 min PRN Lanae Crumbly, PA-C      . ondansetron [employer]) tablet 4 mg  4 mg Oral Q6H PRN Lanae Crumbly, PA-C   4 mg at 12/22/17 1040   Or  . ondansetron (ZOFRAN) injection 4 mg  4 mg Intravenous Q6H PRN Lanae Crumbly, PA-C   4 mg at 12/20/17 0258  . oxyCODONE (Oxy IR/ROXICODONE) immediate release tablet 5 mg  5 mg Oral Q6H PRN Lanae Crumbly, PA-C   5 mg at 12/22/17 5277  . pantoprazole (PROTONIX) EC tablet 40 mg  40 mg Oral Daily Lanae Crumbly, PA-C   40 mg at 12/21/17 0915  . polyethylene glycol (MIRALAX / GLYCOLAX) packet 17 g  17 g Oral Daily PRN Lanae Crumbly, PA-C   17 g at 12/21/17 2124  . rosuvastatin (CRESTOR) tablet 20 mg  20 mg Oral q1800 Lanae Crumbly, PA-C   20 mg at 12/21/17 1700  . spironolactone (ALDACTONE) tablet 25 mg  25 mg Oral Daily Lanae Crumbly, PA-C   25 mg at 12/21/17 8242     Discharge Medications: Please see discharge summary for a list of discharge medications.  Relevant Imaging Results:  Relevant Lab Results:   Additional Information SS#: [place], LCSW

## 2017-12-22 NOTE — Progress Notes (Addendum)
   Subjective: 3 Days Post-Op Procedure(s) (LRB): RIGHT TOTAL HIP ARTHROPLASTY ANTERIOR APPROACH (Right) Patient reports pain as minimal in bed. When she gets up she has pain and has miminal mobility . No BM yet. She is taking pain meds regularly and is sleepy.   Objective: Vital signs in last 24 hours: Temp:  [98.3 F (36.8 C)-100.2 F (37.9 C)] 98.7 F (37.1 C) (06/20 0419) Pulse Rate:  [86-97] 92 (06/20 0419) Resp:  [14-18] 14 (06/20 0419) BP: (107-130)/(54-65) 108/58 (06/20 0419) SpO2:  [93 %-100 %] 94 % (06/20 0419)  Intake/Output from previous day: 06/19 0701 - 06/20 0700 In: 720 [P.O.:720] Out: -  Intake/Output this shift: No intake/output data recorded.  Recent Labs    12/20/17 0535 12/21/17 0442 12/22/17 0548  HGB 11.7* 10.2* 9.7*   Recent Labs    12/21/17 0442 12/22/17 0548  WBC 12.5* 9.8  RBC 3.79* 3.56*  HCT 30.0* 28.0*  PLT 115* 105*   Recent Labs    12/20/17 0535  NA 139  K 4.2  CL 106  CO2 26  BUN 16  CREATININE 0.80  GLUCOSE 138*  CALCIUM 9.0   No results for input(s): LABPT, INR in the last 72 hours.  Neurologically intact No results found.  Assessment/Plan: 3 Days Post-Op Procedure(s) (LRB): RIGHT TOTAL HIP ARTHROPLASTY ANTERIOR APPROACH (Right) Up with therapy, dulcolax supp. , if no results, fleets. Encouraged her to take pain meds before therapy and not so regularly around the clock so she will be more alert. If not making progress today will need SNF Acute blood loss anemia with Hgb 9.7 Marybelle Killings 12/22/2017, 7:53 AM

## 2017-12-22 NOTE — Progress Notes (Signed)
Pt with severe nausea. Pt given prn medication and informed of holding medication at this time. Pt with clear liquid emesis of 30 ml x 1 prior to prn medication. Will continue to monitor.

## 2017-12-22 NOTE — Progress Notes (Signed)
Physical Therapy Treatment Patient Details Name: Carolyn Rowland MRN: [id number] DOB: March 24, 1941 Today's Date: 12/22/2017    History of Present Illness Pt is a 77 y.o. female s/p R THA (direct anterior approach) on 12/20/17. PMH includes HTN, CAD, vertigo.   PT Comments    Pt progressing minimally with mobility. Limited by multiple complaints this session, potentially self-limiting, but also vomited 2x while sitting EOB. Took >25 minutes to stand and take pivotal steps from bed<>BSC with RW and min guard; max cues for technique and effort.   Pt now stating family not reliable support at home as they are "very dependent." Pt reports very short distance into home and bedroom, but would benefit from wheelchair for any significant distance at this point. May benefit from short-term SNF-level therapies to maximize functional mobility and independence prior to return home; hopeful for improved mobility next session.   Follow Up Recommendations  Follow surgeon's recommendation for DC plan and follow-up therapies;Supervision/Assistance - 24 hour     Equipment Recommendations  Wheelchair (measurements PT);Wheelchair cushion (measurements PT)    Recommendations for Other Services       Precautions / Restrictions Precautions Precautions: Fall Restrictions Weight Bearing Restrictions: Yes RLE Weight Bearing: Weight bearing as tolerated    Mobility  Bed Mobility Overal bed mobility: Needs Assistance Bed Mobility: Sit to Supine           General bed mobility comments: Received sitting EOB with NT in preparation to use BSC. ModA to assist RLE back into bed; pt not attempting any technique to self-assist her own leg back into to bed despite max education and cues  Transfers Overall transfer level: Needs assistance Equipment used: Rolling walker (2 wheeled) Transfers: Sit to/from Stand Sit to Stand: Min guard         General transfer comment: Took pt ~15 minutes sitting EOB in  preparation to stand; nausea/vomiting while sitting EOB.   Ambulation/Gait Ambulation/Gait assistance: Min guard Gait Distance (Feet): 2 Feet Assistive device: Rolling walker (2 wheeled) Gait Pattern/deviations: Step-to pattern;Decreased stride length;Decreased weight shift to right;Antalgic;Trunk flexed;Decreased dorsiflexion - right Gait velocity: decreased   General Gait Details: Declining amb into bathroom; pivotal steps from bed<>BSC with RW and min guard. Cues for proximity to RW; very slow antalgic amb. Pt clearing R foot on way to [facility], but using UE to pull R thigh by ted hose to move it back towards bed   Stairs             Wheelchair Mobility    Modified Rankin (Stroke Patients Only)       Balance Overall balance assessment: Needs assistance Sitting-balance support: Feet supported Sitting balance-Leahy Scale: Good     Standing balance support: Bilateral upper extremity supported Standing balance-Leahy Scale: Poor Standing balance comment: RW for support                            Cognition Arousal/Alertness: Awake/alert Behavior During Therapy: Flat affect Overall Cognitive Status: Within Functional Limits for tasks assessed                                 General Comments: WFL, but pt also seems very self-limiting, very slow moving and slow to answer questions      Exercises      General Comments        Pertinent Vitals/Pain Pain Assessment: Faces Faces Pain Scale:  Hurts even more Pain Location: R hip Pain Descriptors / Indicators: Aching;Grimacing;Guarding;Moaning Pain Intervention(s): Monitored during session;Limited activity within patient's tolerance;Repositioned    Home Living                      Prior Function            PT Goals (current goals can now be found in the care plan section) Acute Rehab PT Goals Patient Stated Goal: decrease pain PT Goal Formulation: With patient Time For Goal  Achievement: 01/03/18 Potential to Achieve Goals: Fair Progress towards PT goals: Not progressing toward goals - comment    Frequency    7X/week      PT Plan Equipment recommendations need to be updated    Co-evaluation              AM-PAC PT "6 Clicks" Daily Activity  Outcome Measure  Difficulty turning over in bed (including adjusting bedclothes, sheets and blankets)?: Unable Difficulty moving from lying on back to sitting on the side of the bed? : Unable Difficulty sitting down on and standing up from a chair with arms (e.g., wheelchair, bedside commode, etc,.)?: A Little Help needed moving to and from a bed to chair (including a wheelchair)?: A Little Help needed walking in hospital room?: A Lot Help needed climbing 3-5 steps with a railing? : A Lot 6 Click Score: 12    End of Session Equipment Utilized During Treatment: Gait belt Activity Tolerance: Patient limited by pain Patient left: in bed;with call bell/phone within reach;with bed alarm set Nurse Communication: Mobility status PT Visit Diagnosis: Other abnormalities of gait and mobility (R26.89);Pain;Difficulty in walking, not elsewhere classified (R26.2) Pain - Right/Left: Right Pain - part of body: Hip     Time: 1696-7893 PT Time Calculation (min) (ACUTE ONLY): 30 min  Charges:  $Therapeutic Activity: 23-37 mins                    G Codes:      Mabeline Caras, PT, DPT Acute Rehab Services  Pager: [place] 12/22/2017, 10:16 AM

## 2017-12-23 MED ORDER — ASPIRIN 325 MG PO TBEC
325.0000 mg | DELAYED_RELEASE_TABLET | Freq: Every day | ORAL | 0 refills | Status: DC
Start: 2017-12-23 — End: 2018-07-27

## 2017-12-23 MED ORDER — METHOCARBAMOL 500 MG PO TABS: 500.0000 mg | ORAL_TABLET | Freq: Four times a day (QID) | ORAL | 0 refills | Status: DC | PRN

## 2017-12-23 MED ORDER — HYDROCODONE-ACETAMINOPHEN 5-325 MG PO TABS: 1.0000 | ORAL_TABLET | Freq: Four times a day (QID) | ORAL | 0 refills | Status: DC | PRN

## 2017-12-23 MED ORDER — BISACODYL 10 MG RE SUPP
10.0000 mg | Freq: Once | RECTAL | Status: AC
  Administered 2017-12-23: 10 mg via RECTAL
  Filled 2017-12-23: qty 1

## 2017-12-23 MED ORDER — MAGNESIUM CITRATE PO SOLN
1.0000 | Freq: Once | ORAL | Status: AC
  Administered 2017-12-23: 1 via ORAL
  Filled 2017-12-23: qty 296

## 2017-12-23 NOTE — Progress Notes (Signed)
Physical Therapy Treatment Patient Details Name: Carolyn Rowland MRN: [id number] DOB: 1940/09/11 Today's Date: 12/23/2017    History of Present Illness Pt is a 77 y.o. female s/p R THA (direct anterior approach) on 12/20/17. PMH includes HTN, CAD, vertigo.    PT Comments    Pt performed gait training with max VCs for encouragement and to advance gait distance.  Pt performed supine exercises with AAROM at times due to pain and weakness.  She continues to progress slowly and remains to benefit from short term placement at SNF to improve strength and function before returning home.      Follow Up Recommendations  Follow surgeon's recommendation for DC plan and follow-up therapies;SNF;Supervision/Assistance - 24 hour     Equipment Recommendations  Wheelchair (measurements PT);Wheelchair cushion (measurements PT)    Recommendations for Other Services Rehab consult     Precautions / Restrictions Precautions Precautions: Fall Restrictions Weight Bearing Restrictions: Yes RLE Weight Bearing: Weight bearing as tolerated    Mobility  Bed Mobility Overal bed mobility: Needs Assistance Bed Mobility: Supine to Sit     Supine to sit: Supervision     General bed mobility comments: Supervision for safety with increased time and effort.  Use of bed rail observed.   Transfers Overall transfer level: Needs assistance Equipment used: Rolling walker (2 wheeled) Transfers: Sit to/from Stand Sit to Stand: Min assist         General transfer comment: Cues for hand placement to and from seated surface.  Minimal assistance to boost into standing.    Ambulation/Gait Ambulation/Gait assistance: Min guard Gait Distance (Feet): 70 Feet Assistive device: Rolling walker (2 wheeled) Gait Pattern/deviations: Step-to pattern;Decreased stride length;Decreased weight shift to right;Antalgic;Trunk flexed;Decreased dorsiflexion - right;Step-through pattern Gait velocity: decreased   General Gait  Details: Cues for upper trunk control and forward progression.  Multiple standing rest breaks.  Remains to report dizziness but when asked to describe her dizziness it does not match.  Encouragement required to progress gait distance.     Stairs             Wheelchair Mobility    Modified Rankin (Stroke Patients Only)       Balance Overall balance assessment: Needs assistance Sitting-balance support: Feet supported Sitting balance-Leahy Scale: Fair Sitting balance - Comments: Unable to reach feet seated EOB   Standing balance support: Bilateral upper extremity supported Standing balance-Leahy Scale: Poor Standing balance comment: reliant on B UE support using RW                             Cognition Arousal/Alertness: Awake/alert Behavior During Therapy: Flat affect Overall Cognitive Status: Within Functional Limits for tasks assessed                                 General Comments: WFL, but intermittently slow to respond and follow commands; increased time for tasks      Exercises Total Joint Exercises Ankle Circles/Pumps: AROM;Both;10 reps;Supine Quad Sets: AROM;Right;10 reps;Supine Heel Slides: AAROM;Right;10 reps;Supine Long Arc Quad: AAROM;Right;10 reps;Seated    General Comments        Pertinent Vitals/Pain Pain Assessment: Faces Faces Pain Scale: Hurts a little bit Pain Location: R hip with therex/ROM Pain Descriptors / Indicators: Sore Pain Intervention(s): Monitored during session;Repositioned    Home Living  Prior Function            PT Goals (current goals can now be found in the care plan section) Acute Rehab PT Goals Patient Stated Goal: decrease pain Potential to Achieve Goals: Fair Progress towards PT goals: Progressing toward goals    Frequency    7X/week      PT Plan Current plan remains appropriate    Co-evaluation              AM-PAC PT "6 Clicks" Daily  Activity  Outcome Measure  Difficulty turning over in bed (including adjusting bedclothes, sheets and blankets)?: Unable Difficulty moving from lying on back to sitting on the side of the bed? : Unable Difficulty sitting down on and standing up from a chair with arms (e.g., wheelchair, bedside commode, etc,.)?: Unable Help needed moving to and from a bed to chair (including a wheelchair)?: A Little Help needed walking in hospital room?: A Little Help needed climbing 3-5 steps with a railing? : A Lot 6 Click Score: 11    End of Session Equipment Utilized During Treatment: Gait belt Activity Tolerance: Patient tolerated treatment well;Patient limited by pain Patient left: in bed;with call bell/phone within reach Nurse Communication: Mobility status PT Visit Diagnosis: Other abnormalities of gait and mobility (R26.89);Pain;Difficulty in walking, not elsewhere classified (R26.2) Pain - Right/Left: Right Pain - part of body: Hip     Time: 1430-1503 PT Time Calculation (min) (ACUTE ONLY): 33 min  Charges:  $Gait Training: 8-22 mins $Therapeutic Exercise: 8-22 mins                    G Codes:       Governor [place], PTA pager [phone]    Cristela Blue 12/23/2017, 3:53 PM

## 2017-12-23 NOTE — Progress Notes (Addendum)
Subjective: 4 Days Post-Op Procedure(s) (LRB): RIGHT TOTAL HIP ARTHROPLASTY ANTERIOR APPROACH (Right) Patient reports pain as mild.    Objective: Vital signs in last 24 hours: Temp:  [98.5 F (36.9 C)-99.8 F (37.7 C)] 98.5 F (36.9 C) (06/21 0425) Pulse Rate:  [94-103] 94 (06/21 0425) Resp:  [16] 16 (06/20 1952) BP: (113-139)/(46-67) 113/67 (06/21 0425) SpO2:  [94 %-96 %] 95 % (06/21 0425)  Intake/Output from previous day: 06/20 0701 - 06/21 0700 In: 960 [P.O.:960] Out: 300 [Urine:300] Intake/Output this shift: No intake/output data recorded.  Recent Labs    12/21/17 0442 12/22/17 0548  HGB 10.2* 9.7*   Recent Labs    12/21/17 0442 12/22/17 0548  WBC 12.5* 9.8  RBC 3.79* 3.56*  HCT 30.0* 28.0*  PLT 115* 105*   No results for input(s): NA, K, CL, CO2, BUN, CREATININE, GLUCOSE, CALCIUM in the last 72 hours. No results for input(s): LABPT, INR in the last 72 hours.  Neurologically intact Neurovascular intact Sensation intact distally Intact pulses distally Dorsiflexion/Plantar flexion intact Incision: dressing C/D/I No cellulitis present Compartment soft    Assessment/Plan: 4 Days Post-Op Procedure(s) (LRB): RIGHT TOTAL HIP ARTHROPLASTY ANTERIOR APPROACH (Right) Advance diet Up with therapy D/C IV fluids Discharge to SNF once bed available (likely over the weekend) WBAT RLE    Aundra Dubin 12/23/2017, 8:03 AM

## 2017-12-23 NOTE — Care Management Important Message (Signed)
Important Message  Patient Details  Name: Carolyn Rowland MRN: [id number] Date of Birth: [birth year]   Medicare Important Message Given:  Yes    Delorse Lek 12/23/2017, 12:47 PM

## 2017-12-23 NOTE — Discharge Summary (Signed)
Patient ID: Carolyn Rowland MRN: [id number] DOB/AGE: [birth year] 75 y.o.  Admit date: 12/19/2017 Discharge date: 12/23/2017  Admission Diagnoses:  Active Problems:   Arthritis of right hip   Discharge Diagnoses:  Active Problems:   Arthritis of right hip  status post Procedure(s): RIGHT TOTAL HIP ARTHROPLASTY ANTERIOR APPROACH  Past Medical History:  Diagnosis Date  . Anemia    in younger years  . Anginal pain ([place])   . Aortic stenosis    mild to moderate AS 07/2013  . Arthritis    "hands; legs" (08/17/2013)  . CAP (community acquired pneumonia)   . Coronary artery disease    6 stents in 2015  . GERD (gastroesophageal reflux disease)   . H/O hiatal hernia   . Heart murmur   . High cholesterol   . Hypertension   . Pneumonia 1970's   "once"  . Radiation 11/28/14-01/02/15   Right breast 50 Gray  . Vertigo     Surgeries: Procedure(s): RIGHT TOTAL HIP ARTHROPLASTY ANTERIOR APPROACH on 12/19/2017   Consultants:   Discharged Condition: Improved  Hospital Course: Carolyn Rowland is an 77 y.o. female who was admitted 12/19/2017 for operative treatment of right hip arthritis. Patient failed conservative treatments (please see the history and physical for the specifics) and had severe unremitting pain that affects sleep, daily activities and work/hobbies. After pre-op clearance, the patient was taken to the operating room on 12/19/2017 and underwent  Procedure(s): RIGHT TOTAL HIP ARTHROPLASTY ANTERIOR APPROACH.    Patient was given perioperative antibiotics:  Anti-infectives (From admission, onward)   Start     Dose/Rate Route Frequency Ordered Stop   12/19/17 2200  metroNIDAZOLE (FLAGYL) tablet 500 mg     500 mg Oral 3 times daily 12/19/17 1813     12/19/17 2200  vancomycin (VANCOCIN) IVPB 1000 mg/200 mL premix     1,000 mg 200 mL/hr over 60 Minutes Intravenous Every 12 hours 12/19/17 1813 12/19/17 2309   12/19/17 1100  vancomycin (VANCOCIN) IVPB 1000 mg/200 mL premix      1,000 mg 200 mL/hr over 60 Minutes Intravenous To ShortStay Surgical 12/16/17 0941 12/19/17 1300       Patient was given sequential compression devices and early ambulation to prevent DVT.   Patient benefited maximally from hospital stay and there were no complications. At the time of discharge, the patient was urinating/moving their bowels without difficulty, tolerating a regular diet, pain is controlled with oral pain medications and they have been cleared by PT/OT.   Recent vital signs:  Patient Vitals for the past 24 hrs:  BP Temp Temp src Pulse Resp SpO2  12/23/17 0425 113/67 98.5 F (36.9 C) Oral 94 - 95 %  12/22/17 1952 (!) 139/46 99.8 F (37.7 C) Oral (!) 103 16 94 %  12/22/17 1500 (!) 128/57 99.1 F (37.3 C) Oral 100 16 96 %     Recent laboratory studies:  Recent Labs    12/21/17 0442 12/22/17 0548  WBC 12.5* 9.8  HGB 10.2* 9.7*  HCT 30.0* 28.0*  PLT 115* 105*     Discharge Medications:   Allergies as of 12/23/2017      Reactions   Penicillins Rash   Has patient had a PCN reaction causing immediate rash, facial/tongue/throat swelling, SOB or lightheadedness with hypotension: No Has patient had a PCN reaction causing severe rash involving mucus membranes or skin necrosis: Unknown Has patient had a PCN reaction that required hospitalization: No Has patient had a PCN reaction occurring within  the last 10 years: No If all of the above answers are "NO", then may proceed with Cephalosporin use.      Medication List    STOP taking these medications   acetaminophen-codeine 300-30 MG tablet Commonly known as:  TYLENOL #3   aspirin 81 MG chewable tablet Replaced by:  aspirin 325 MG EC tablet   doxycycline 100 MG capsule Commonly known as:  VIBRAMYCIN   metroNIDAZOLE 500 MG tablet Commonly known as:  FLAGYL   naproxen sodium 220 MG tablet Commonly known as:  ALEVE     TAKE these medications   amLODipine 2.5 MG tablet Commonly known as:  NORVASC Take 2.5  mg by mouth daily.   anastrozole 1 MG tablet Commonly known as:  ARIMIDEX Take 1 tablet (1 mg total) by mouth daily.   aspirin 325 MG EC tablet Take 1 tablet (325 mg total) by mouth daily with breakfast. Replaces:  aspirin 81 MG chewable tablet   CALCIUM & VIT D3 BONE HEALTH PO Take 1 tablet by mouth 2 (two) times a week.   CRESTOR 20 MG tablet Generic drug:  rosuvastatin Take 20 mg by mouth daily.   HYDROcodone-acetaminophen 5-325 MG tablet Commonly known as:  NORCO Take 1 tablet by mouth every 6 (six) hours as needed for moderate pain.   isosorbide mononitrate 60 MG 24 hr tablet Commonly known as:  IMDUR Take 1 tablet (60 mg total) by mouth daily.   methocarbamol 500 MG tablet Commonly known as:  ROBAXIN Take 1 tablet (500 mg total) by mouth every 6 (six) hours as needed for muscle spasms.   metoprolol succinate 50 MG 24 hr tablet Commonly known as:  TOPROL-XL Take 50 mg by mouth daily. Take with or immediately following a meal.   nitroGLYCERIN 0.4 MG SL tablet Commonly known as:  NITROSTAT Place 1 tablet (0.4 mg total) under the tongue every 5 (five) minutes as needed for chest pain.   omeprazole 40 MG capsule Commonly known as:  PRILOSEC Take 40 mg by mouth daily.   spironolactone 25 MG tablet Commonly known as:  ALDACTONE Take 25 mg by mouth daily.   Vitamin D3 400 units Caps Take 400 Units by mouth daily.       Diagnostic Studies: Dg Chest 2 View  Result Date: 12/09/2017 CLINICAL DATA:  Preoperative evaluation for RIGHT hip surgery EXAM: CHEST - 2 VIEW COMPARISON:  08/19/2016 FINDINGS: Normal heart size, mediastinal contours, and pulmonary vascularity. Atherosclerotic calcification aorta. Lungs clear. No pleural effusion or pneumothorax. Bones appear demineralized. Multiple coronary arterial stents noted. IMPRESSION: No acute abnormalities. Electronically Signed   By: Lavonia Dana M.D.   On: 12/09/2017 17:02   Dg C-arm 1-60 Min  Result Date:  12/19/2017 CLINICAL DATA:  Status post anterior approach right total hip joint prosthesis placement. EXAM: OPERATIVE right HIP (WITH PELVIS IF PERFORMED) 3 VIEWS TECHNIQUE: Fluoroscopic spot image(s) were submitted for interpretation post-operatively. COMPARISON:  None. FINDINGS: The patient has undergone right total hip joint prosthesis placement. The interface of the prosthetic components with the native bone is good. No acute native bone abnormality is observed. IMPRESSION: There is no immediate postprocedure complication following anterior approach right total hip joint prosthesis placement. Electronically Signed   By: David  Martinique M.D.   On: 12/19/2017 16:02   Dg C-arm 1-60 Min  Result Date: 12/19/2017 CLINICAL DATA:  Status post anterior approach right total hip joint prosthesis placement. EXAM: OPERATIVE right HIP (WITH PELVIS IF PERFORMED) 3 VIEWS TECHNIQUE: Fluoroscopic spot image(s)  were submitted for interpretation post-operatively. COMPARISON:  None. FINDINGS: The patient has undergone right total hip joint prosthesis placement. The interface of the prosthetic components with the native bone is good. No acute native bone abnormality is observed. IMPRESSION: There is no immediate postprocedure complication following anterior approach right total hip joint prosthesis placement. Electronically Signed   By: David  Martinique M.D.   On: 12/19/2017 16:02   Dg Hip Port Unilat With Pelvis 1v Right  Result Date: 12/19/2017 CLINICAL DATA:  Status post right total hip arthroplasty. EXAM: DG HIP (WITH OR WITHOUT PELVIS) 1V PORT RIGHT COMPARISON:  Fluoroscopic images of same day. FINDINGS: The right femoral and acetabular components appear to be well situated. Expected postoperative changes are seen in the surrounding soft tissues. Moderate narrowing of left hip joint is noted in superior orientation. IMPRESSION: Status post right total hip arthroplasty. 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  Electronically Signed   By: Marijo Conception, M.D.   On: 12/19/2017 16:18   Dg Hip Operative Unilat W Or W/o Pelvis Right  Result Date: 12/19/2017 CLINICAL DATA:  Status post anterior approach right total hip joint prosthesis placement. EXAM: OPERATIVE right HIP (WITH PELVIS IF PERFORMED) 3 VIEWS TECHNIQUE: Fluoroscopic spot image(s) were submitted for interpretation post-operatively. COMPARISON:  None. FINDINGS: The patient has undergone right total hip joint prosthesis placement. The interface of the prosthetic components with the native bone is good. No acute native bone abnormality is observed. IMPRESSION: There is no immediate postprocedure complication following anterior approach right total hip joint prosthesis placement. Electronically Signed   By: David  Martinique M.D.   On: 12/19/2017 16:02      Follow-up Information    [facility].   Specialty:  [place] Why:  A representative from [facility] will contact you to arrange start date and time for your therapy Contact information: [address] [place] [address] [phone]        Marybelle Killings, MD. Schedule an appointment as soon as possible for a visit today.   Specialty:  Orthopedic Surgery Why:  need return office visit 2 weeks postop with Dr Verlene Mayer information: [place] [address] [phone]           Discharge Plan:  discharge to snf  Disposition:     Signed: Benjiman Core for Rodell Perna  MD  12/23/2017, 7:06 AM

## 2017-12-23 NOTE — Progress Notes (Signed)
Physical Therapy Treatment Patient Details Name: Carolyn Rowland MRN: [id number] DOB: 05-27-1941 Today's Date: 12/23/2017    History of Present Illness Pt is a 77 y.o. female s/p R THA (direct anterior approach) on 12/20/17. PMH includes HTN, CAD, vertigo.    PT Comments    Pt performed therapeutic exercises in supine followed by gait training.  Improved distance this am with max VCs for encouragement.  Pt post tx sitting on commode attempting BM post suppository placement.     Follow Up Recommendations  Follow surgeon's recommendation for DC plan and follow-up therapies;SNF;Supervision/Assistance - 24 hour     Equipment Recommendations  Wheelchair (measurements PT);Wheelchair cushion (measurements PT)    Recommendations for Other Services Rehab consult     Precautions / Restrictions Precautions Precautions: Fall Restrictions Weight Bearing Restrictions: Yes RLE Weight Bearing: Weight bearing as tolerated    Mobility  Bed Mobility Overal bed mobility: Needs Assistance Bed Mobility: Sit to Supine     Supine to sit: Min assist     General bed mobility comments: Pt required cues for LE advancement and hand placement to advance to the edge of the bed.  Increased time and effort to complete.    Transfers Overall transfer level: Needs assistance Equipment used: Rolling walker (2 wheeled) Transfers: Sit to/from Stand Sit to Stand: Min guard Stand pivot transfers: Min guard       General transfer comment: Cues for hand placement with increased time to complete.  Cues for forward weight shifting.    Ambulation/Gait Ambulation/Gait assistance: Min assist Gait Distance (Feet): 60 Feet Assistive device: Rolling walker (2 wheeled) Gait Pattern/deviations: Step-to pattern;Decreased stride length;Decreased weight shift to right;Antalgic;Trunk flexed;Decreased dorsiflexion - right;Step-through pattern Gait velocity: decreased   General Gait Details: Pt required max cues for  encouragement to advance gait distance.  Pt reports she feels dizzy/nervous but no signs of syncope during gait training.  Cues for upper trunk control and advancing steps forward.     Stairs             Wheelchair Mobility    Modified Rankin (Stroke Patients Only)       Balance Overall balance assessment: Needs assistance Sitting-balance support: Feet supported Sitting balance-Leahy Scale: Fair     Standing balance support: Bilateral upper extremity supported Standing balance-Leahy Scale: Poor Standing balance comment: reliant on B UE support using RW                             Cognition Arousal/Alertness: Awake/alert Behavior During Therapy: Flat affect Overall Cognitive Status: Within Functional Limits for tasks assessed                                 General Comments: WFL, but intermittently slow to respond and follow commands; increased time for tasks      Exercises Total Joint Exercises Ankle Circles/Pumps: AROM;Both;10 reps;Supine Quad Sets: AROM;Right;10 reps;Supine Short Arc Quad: AROM;Right;10 reps;Supine Heel Slides: AAROM;Right;10 reps;Supine Hip ABduction/ADduction: AAROM;Right;10 reps;Supine    General Comments        Pertinent Vitals/Pain Pain Assessment: 0-10 Pain Score: 6  Pain Location: R hip with therex/ROM Pain Descriptors / Indicators: Aching;Grimacing;Guarding Pain Intervention(s): Monitored during session;Repositioned    Home Living                      Prior Function  PT Goals (current goals can now be found in the care plan section) Acute Rehab PT Goals Patient Stated Goal: decrease pain Potential to Achieve Goals: Fair Progress towards PT goals: Progressing toward goals    Frequency    7X/week      PT Plan Current plan remains appropriate    Co-evaluation              AM-PAC PT "6 Clicks" Daily Activity  Outcome Measure  Difficulty turning over in bed  (including adjusting bedclothes, sheets and blankets)?: Unable Difficulty moving from lying on back to sitting on the side of the bed? : Unable Difficulty sitting down on and standing up from a chair with arms (e.g., wheelchair, bedside commode, etc,.)?: Unable Help needed moving to and from a bed to chair (including a wheelchair)?: A Little Help needed walking in hospital room?: A Little Help needed climbing 3-5 steps with a railing? : A Lot 6 Click Score: 11    End of Session Equipment Utilized During Treatment: Gait belt Activity Tolerance: Patient tolerated treatment well;Patient limited by pain Patient left: in bed;with call bell/phone within reach Nurse Communication: Mobility status PT Visit Diagnosis: Other abnormalities of gait and mobility (R26.89);Pain;Difficulty in walking, not elsewhere classified (R26.2) Pain - Right/Left: Right Pain - part of body: Hip     Time: 7416-3845 PT Time Calculation (min) (ACUTE ONLY): 24 min  Charges:  $Gait Training: 8-22 mins $Therapeutic Exercise: 8-22 mins                    G Codes:       Governor Rooks, PTA pager [phone]    Cristela Blue 12/23/2017, 10:23 AM

## 2017-12-23 NOTE — Discharge Summary (Signed)
Patient ID: Carolyn Rowland MRN: [id number] DOB/AGE: 1940-08-12 77 y.o.  Admit date: 12/19/2017 Discharge date: 12/23/2017  Admission Diagnoses:  Active Problems:   Arthritis of right hip   Discharge Diagnoses:  Same  Past Medical History:  Diagnosis Date  . Anemia    in younger years  . Anginal pain ([place])   . Aortic stenosis    mild to moderate AS 07/2013  . Arthritis    "hands; legs" (08/17/2013)  . CAP (community acquired pneumonia)   . Coronary artery disease    6 stents in 2015  . GERD (gastroesophageal reflux disease)   . H/O hiatal hernia   . Heart murmur   . High cholesterol   . Hypertension   . Pneumonia 1970's   "once"  . Radiation 11/28/14-01/02/15   Right breast 50 Gray  . Vertigo     Surgeries: Procedure(s): RIGHT TOTAL HIP ARTHROPLASTY ANTERIOR APPROACH on 12/19/2017   Consultants:   Discharged Condition: Improved  Hospital Course: Carolyn Rowland is an 77 y.o. female who was admitted 12/19/2017 for operative treatment of primary localized osteoarthritis right hip. Patient has severe unremitting pain that affects sleep, daily activities, and work/hobbies. After pre-op clearance the patient was taken to the operating room on 12/19/2017 and underwent  Procedure(s): RIGHT TOTAL HIP ARTHROPLASTY ANTERIOR APPROACH.    Patient was given perioperative antibiotics:  Anti-infectives (From admission, onward)   Start     Dose/Rate Route Frequency Ordered Stop   12/19/17 2200  metroNIDAZOLE (FLAGYL) tablet 500 mg     500 mg Oral 3 times daily 12/19/17 1813     12/19/17 2200  vancomycin (VANCOCIN) IVPB 1000 mg/200 mL premix     1,000 mg 200 mL/hr over 60 Minutes Intravenous Every 12 hours 12/19/17 1813 12/19/17 2309   12/19/17 1100  vancomycin (VANCOCIN) IVPB 1000 mg/200 mL premix     1,000 mg 200 mL/hr over 60 Minutes Intravenous To ShortStay Surgical 12/16/17 0941 12/19/17 1300       Patient was given sequential compression devices, early ambulation, and  chemoprophylaxis to prevent DVT.  Patient benefited maximally from hospital stay and there were no complications.    Recent vital signs:  Patient Vitals for the past 24 hrs:  BP Temp Temp src Pulse Resp SpO2  12/23/17 0425 113/67 98.5 F (36.9 C) Oral 94 - 95 %  12/22/17 1952 (!) 139/46 99.8 F (37.7 C) Oral (!) 103 16 94 %  12/22/17 1500 (!) 128/57 99.1 F (37.3 C) Oral 100 16 96 %     Recent laboratory studies:  Recent Labs    12/21/17 0442 12/22/17 0548  WBC 12.5* 9.8  HGB 10.2* 9.7*  HCT 30.0* 28.0*  PLT 115* 105*     Discharge Medications:   Allergies as of 12/23/2017      Reactions   Penicillins Rash   Has patient had a PCN reaction causing immediate rash, facial/tongue/throat swelling, SOB or lightheadedness with hypotension: No Has patient had a PCN reaction causing severe rash involving mucus membranes or skin necrosis: Unknown Has patient had a PCN reaction that required hospitalization: No Has patient had a PCN reaction occurring within the last 10 years: No If all of the above answers are "NO", then may proceed with Cephalosporin use.      Medication List    STOP taking these medications   acetaminophen-codeine 300-30 MG tablet Commonly known as:  TYLENOL #3   aspirin 81 MG chewable tablet Replaced by:  aspirin 325 MG EC  tablet   doxycycline 100 MG capsule Commonly known as:  VIBRAMYCIN   metroNIDAZOLE 500 MG tablet Commonly known as:  FLAGYL   naproxen sodium 220 MG tablet Commonly known as:  ALEVE     TAKE these medications   amLODipine 2.5 MG tablet Commonly known as:  NORVASC Take 2.5 mg by mouth daily.   anastrozole 1 MG tablet Commonly known as:  ARIMIDEX Take 1 tablet (1 mg total) by mouth daily.   aspirin 325 MG EC tablet Take 1 tablet (325 mg total) by mouth daily with breakfast. Replaces:  aspirin 81 MG chewable tablet   CALCIUM & VIT D3 BONE HEALTH PO Take 1 tablet by mouth 2 (two) times a week.   CRESTOR 20 MG  tablet Generic drug:  rosuvastatin Take 20 mg by mouth daily.   HYDROcodone-acetaminophen 5-325 MG tablet Commonly known as:  NORCO Take 1 tablet by mouth every 6 (six) hours as needed for moderate pain.   isosorbide mononitrate 60 MG 24 hr tablet Commonly known as:  IMDUR Take 1 tablet (60 mg total) by mouth daily.   methocarbamol 500 MG tablet Commonly known as:  ROBAXIN Take 1 tablet (500 mg total) by mouth every 6 (six) hours as needed for muscle spasms.   metoprolol succinate 50 MG 24 hr tablet Commonly known as:  TOPROL-XL Take 50 mg by mouth daily. Take with or immediately following a meal.   nitroGLYCERIN 0.4 MG SL tablet Commonly known as:  NITROSTAT Place 1 tablet (0.4 mg total) under the tongue every 5 (five) minutes as needed for chest pain.   omeprazole 40 MG capsule Commonly known as:  PRILOSEC Take 40 mg by mouth daily.   spironolactone 25 MG tablet Commonly known as:  ALDACTONE Take 25 mg by mouth daily.   Vitamin D3 400 units Caps Take 400 Units by mouth daily.       Diagnostic Studies: Dg Chest 2 View  Result Date: 12/09/2017 CLINICAL DATA:  Preoperative evaluation for RIGHT hip surgery EXAM: CHEST - 2 VIEW COMPARISON:  08/19/2016 FINDINGS: Normal heart size, mediastinal contours, and pulmonary vascularity. Atherosclerotic calcification aorta. Lungs clear. No pleural effusion or pneumothorax. Bones appear demineralized. Multiple coronary arterial stents noted. IMPRESSION: No acute abnormalities. Electronically Signed   By: Lavonia Dana M.D.   On: 12/09/2017 17:02   Dg C-arm 1-60 Min  Result Date: 12/19/2017 CLINICAL DATA:  Status post anterior approach right total hip joint prosthesis placement. EXAM: OPERATIVE right HIP (WITH PELVIS IF PERFORMED) 3 VIEWS TECHNIQUE: Fluoroscopic spot image(s) were submitted for interpretation post-operatively. COMPARISON:  None. FINDINGS: The patient has undergone right total hip joint prosthesis placement. The interface of  the prosthetic components with the native bone is good. No acute native bone abnormality is observed. IMPRESSION: There is no immediate postprocedure complication following anterior approach right total hip joint prosthesis placement. Electronically Signed   By: David  Martinique M.D.   On: 12/19/2017 16:02   Dg C-arm 1-60 Min  Result Date: 12/19/2017 CLINICAL DATA:  Status post anterior approach right total hip joint prosthesis placement. EXAM: OPERATIVE right HIP (WITH PELVIS IF PERFORMED) 3 VIEWS TECHNIQUE: Fluoroscopic spot image(s) were submitted for interpretation post-operatively. COMPARISON:  None. FINDINGS: The patient has undergone right total hip joint prosthesis placement. The interface of the prosthetic components with the native bone is good. No acute native bone abnormality is observed. IMPRESSION: There is no immediate postprocedure complication following anterior approach right total hip joint prosthesis placement. Electronically Signed   By:  David  Martinique M.D.   On: 12/19/2017 16:02   Dg Hip Port Unilat With Pelvis 1v Right  Result Date: 12/19/2017 CLINICAL DATA:  Status post right total hip arthroplasty. EXAM: DG HIP (WITH OR WITHOUT PELVIS) 1V PORT RIGHT COMPARISON:  Fluoroscopic images of same day. FINDINGS: The right femoral and acetabular components appear to be well situated. Expected postoperative changes are seen in the surrounding soft tissues. Moderate narrowing of left hip joint is noted in superior orientation. IMPRESSION: Status post right total hip arthroplasty. 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 Electronically Signed   By: Marijo Conception, M.D.   On: 12/19/2017 16:18   Dg Hip Operative Unilat W Or W/o Pelvis Right  Result Date: 12/19/2017 CLINICAL DATA:  Status post anterior approach right total hip joint prosthesis placement. EXAM: OPERATIVE right HIP (WITH PELVIS IF PERFORMED) 3 VIEWS TECHNIQUE: Fluoroscopic spot image(s) were submitted for interpretation  post-operatively. COMPARISON:  None. FINDINGS: The patient has undergone right total hip joint prosthesis placement. The interface of the prosthetic components with the native bone is good. No acute native bone abnormality is observed. IMPRESSION: There is no immediate postprocedure complication following anterior approach right total hip joint prosthesis placement. Electronically Signed   By: David  Martinique M.D.   On: 12/19/2017 16:02    Disposition: Discharge disposition: 03-Skilled [place], [facility] Follow up.   Specialty:  [place] Why:  A representative from [place] will contact you to arrange start date and time for your therapy Contact information: [address] [place] [address] [phone]        Marybelle Killings, MD. Schedule an appointment as soon as possible for a visit today.   Specialty:  Orthopedic Surgery Why:  need return office visit 2 weeks postop with Dr Verlene Mayer information: [place] [address] [phone]            Signed: Aundra Dubin 12/23/2017, 8:03 AM

## 2017-12-23 NOTE — Progress Notes (Signed)
CSW met with patient to provide bed offers. Patient has chosen [facility]. CSW contacted Admissions at [facility], they are working on insurance authorization through [employer]. Patient cannot admit to SNF until authorization has been received. CSW alerted MD of barrier to discharge.  CSW to follow.  Elizabeth Paisley, LCSW Clinical Social Worker [phone]  

## 2017-12-24 NOTE — Social Work (Signed)
Await [employer] authorization through [facility], unfortunately insurance company closed on weekend, SNF will not accept LOG. Likely discharge Monday.   Alexander Mt, [place] Work [phone]

## 2017-12-24 NOTE — Progress Notes (Signed)
Patient ID: Carolyn Rowland, female   DOB: 06-16-1941, 77 y.o.   MRN: [id number] No acute changes.  Vitals stable and right hip stable.  Can go to skilled nursing today if authorization goes thru.

## 2017-12-24 NOTE — Progress Notes (Signed)
Physical Therapy Treatment Patient Details Name: OTHA RICKLES MRN: [id number] DOB: Jul 08, 1940 Today's Date: 12/24/2017    History of Present Illness Pt is a 77 y.o. female s/p R THA (direct anterior approach) on 12/20/17. PMH includes HTN, CAD, vertigo.    PT Comments    Pt remains to c/o of dizziness with mobility and report this has been bothering her before, see gait comments for more details.  She remains guarded and continues to require min guard assistance for transfers and gait training.  Pt able to progress to step through pattern when returning to room.  Pt remains slow to process commands.  She continues to benefit from short term SNF placement to improve strength and function.      Follow Up Recommendations  Follow surgeon's recommendation for DC plan and follow-up therapies;SNF;Supervision/Assistance - 24 hour     Equipment Recommendations  Wheelchair (measurements PT);Wheelchair cushion (measurements PT)    Recommendations for Other Services Rehab consult     Precautions / Restrictions Precautions Precautions: Fall Restrictions Weight Bearing Restrictions: Yes RLE Weight Bearing: Weight bearing as tolerated    Mobility  Bed Mobility               General bed mobility comments: pt is in recliner chair on arrival.    Transfers Overall transfer level: Needs assistance Equipment used: Rolling walker (2 wheeled) Transfers: Sit to/from Stand Sit to Stand: Min guard Stand pivot transfers: Min guard       General transfer comment: Cues for hand placement and forward weight shifting to and from seated surface.    Ambulation/Gait Ambulation/Gait assistance: Min guard Gait Distance (Feet): 65 Feet Assistive device: Rolling walker (2 wheeled) Gait Pattern/deviations: Step-to pattern;Decreased stride length;Decreased weight shift to right;Antalgic;Trunk flexed;Decreased dorsiflexion - right;Step-through pattern Gait velocity: decreased   General Gait  Details: Pt slow and guarded intially with poor acceptance of weight through R LE.  pt required several standing rest breaks in which she reports that she feels dizziness.  When asked to describe, she reports she doesn't know why her legs are sticking and that she just can't describe it she just has this and it's been going on pre surgery.  Pt asked if room was getting darker or objects/room was spinning and she reports no.     Stairs             Wheelchair Mobility    Modified Rankin (Stroke Patients Only)       Balance Overall balance assessment: Needs assistance Sitting-balance support: Feet supported Sitting balance-Leahy Scale: Fair       Standing balance-Leahy Scale: Poor                              Cognition Arousal/Alertness: Awake/alert Behavior During Therapy: Flat affect Overall Cognitive Status: Within Functional Limits for tasks assessed                                 General Comments: WFL, but intermittently slow to respond and follow commands; increased time for tasks      Exercises Total Joint Exercises Ankle Circles/Pumps: AROM;Both;Supine;20 reps Quad Sets: AROM;Right;10 reps;Supine Heel Slides: AAROM;Right;10 reps;Supine Long Arc Quad: Right;10 reps;Seated;AROM    General Comments        Pertinent Vitals/Pain Pain Assessment: 0-10 Pain Score: 6  Pain Location: R hip with therex/ROM Pain Descriptors / Indicators: Sore Pain  Intervention(s): Repositioned;Monitored during session    Home Living                      Prior Function            PT Goals (current goals can now be found in the care plan section) Acute Rehab PT Goals Patient Stated Goal: decrease pain Potential to Achieve Goals: Fair Progress towards PT goals: Progressing toward goals    Frequency    7X/week      PT Plan Current plan remains appropriate    Co-evaluation              AM-PAC PT "6 Clicks" Daily Activity   Outcome Measure  Difficulty turning over in bed (including adjusting bedclothes, sheets and blankets)?: Unable Difficulty moving from lying on back to sitting on the side of the bed? : Unable Difficulty sitting down on and standing up from a chair with arms (e.g., wheelchair, bedside commode, etc,.)?: Unable Help needed moving to and from a bed to chair (including a wheelchair)?: A Little Help needed walking in hospital room?: A Little Help needed climbing 3-5 steps with a railing? : A Lot 6 Click Score: 11    End of Session Equipment Utilized During Treatment: Gait belt Activity Tolerance: Patient tolerated treatment well;Patient limited by pain Patient left: in bed;with call bell/phone within reach Nurse Communication: Mobility status PT Visit Diagnosis: Other abnormalities of gait and mobility (R26.89);Pain;Difficulty in walking, not elsewhere classified (R26.2) Pain - Right/Left: Right Pain - part of body: Hip     Time: 0539-7673 PT Time Calculation (min) (ACUTE ONLY): 20 min  Charges:  $Gait Training: 8-22 mins                    G Codes:       Governor Rooks, PTA pager [phone]    Cristela Blue 12/24/2017, 11:57 AM

## 2017-12-25 NOTE — Progress Notes (Signed)
Physical Therapy Treatment Patient Details Name: Carolyn Rowland MRN: [id number] DOB: 11-22-1940 Today's Date: 12/25/2017    History of Present Illness Pt is a 77 y.o. female s/p R THA (direct anterior approach) on 12/20/17. PMH includes HTN, CAD, vertigo.    PT Comments    Patient maintaining progress towards goals. Ambulating 60 feet with walker and min guard assist. No evidence of imbalance but very slow, antalgic gait with forward trunk flexion. Rest of session focused on bed level therapeutic exercises for strengthening. Continues to demonstrate slow processing. SNF remains appropriate recommendation to maximize functional independence.   Follow Up Recommendations  Follow surgeon's recommendation for DC plan and follow-up therapies;Supervision/Assistance - 24 hour     Equipment Recommendations  Wheelchair (measurements PT);Wheelchair cushion (measurements PT)    Recommendations for Other Services       Precautions / Restrictions Precautions Precautions: Fall Restrictions Weight Bearing Restrictions: Yes RLE Weight Bearing: Weight bearing as tolerated    Mobility  Bed Mobility Overal bed mobility: Needs Assistance Bed Mobility: Supine to Sit     Supine to sit: Supervision        Transfers Overall transfer level: Needs assistance Equipment used: Rolling walker (2 wheeled) Transfers: Sit to/from Stand Sit to Stand: Min guard         General transfer comment: min guard for transition from edge of bed and toilet to standing with walker  Ambulation/Gait Ambulation/Gait assistance: Min guard Gait Distance (Feet): 60 Feet Assistive device: Rolling walker (2 wheeled) Gait Pattern/deviations: Decreased stride length;Decreased weight shift to right;Antalgic;Trunk flexed;Decreased dorsiflexion - right;Step-through pattern Gait velocity: decreased   General Gait Details: Patient with noted decreased weightbearing through RLE. Patient states she feels a "floating,  anxiety feeling," during gait and requested to turn around. Cueing for decreased pelvic rotation.   Stairs             Wheelchair Mobility    Modified Rankin (Stroke Patients Only)       Balance Overall balance assessment: Needs assistance Sitting-balance support: Feet supported Sitting balance-Leahy Scale: Good       Standing balance-Leahy Scale: Poor                              Cognition Arousal/Alertness: Awake/alert Behavior During Therapy: Flat affect Overall Cognitive Status: Within Functional Limits for tasks assessed                                 General Comments: WFL, but intermittently slow to respond and follow commands; increased time for tasks      Exercises Total Joint Exercises Quad Sets: AROM;Right;10 reps;Supine Heel Slides: AAROM;Right;10 reps;Supine Hip ABduction/ADduction: Right;10 reps;Supine;AROM    General Comments        Pertinent Vitals/Pain Pain Assessment: Faces Faces Pain Scale: Hurts little more Pain Location: R hip with therex/ROM Pain Descriptors / Indicators: Sore Pain Intervention(s): Limited activity within patient's tolerance;Monitored during session    Home Living                      Prior Function            PT Goals (current goals can now be found in the care plan section) Acute Rehab PT Goals Patient Stated Goal: decrease pain    Frequency    7X/week      PT Plan Current plan remains appropriate  Co-evaluation              AM-PAC PT "6 Clicks" Daily Activity  Outcome Measure  Difficulty turning over in bed (including adjusting bedclothes, sheets and blankets)?: A Little Difficulty moving from lying on back to sitting on the side of the bed? : A Little Difficulty sitting down on and standing up from a chair with arms (e.g., wheelchair, bedside commode, etc,.)?: A Little Help needed moving to and from a bed to chair (including a wheelchair)?: A  Little Help needed walking in hospital room?: A Little Help needed climbing 3-5 steps with a railing? : A Lot 6 Click Score: 17    End of Session Equipment Utilized During Treatment: Gait belt Activity Tolerance: Patient tolerated treatment well Patient left: with call bell/phone within reach;in chair   PT Visit Diagnosis: Other abnormalities of gait and mobility (R26.89);Pain;Difficulty in walking, not elsewhere classified (R26.2) Pain - Right/Left: Right Pain - part of body: Hip     Time: 7793-9688 PT Time Calculation (min) (ACUTE ONLY): 22 min  Charges:  $Therapeutic Exercise: 8-22 mins                    G Codes:       Ellamae Sia, PT, DPT Acute Rehabilitation Services  Pager: [phone]    Willy Eddy 12/25/2017, 12:57 PM

## 2017-12-25 NOTE — Progress Notes (Signed)
Patient ID: Carolyn Rowland, female   DOB: [birth year], 77 y.o.   MRN: [id number] No acute changes. Does report feeling better today.  Could not get to skilled nursing yesterday.  Vitals stable and right hip stable.  Likely SNF tomorrow.

## 2017-12-26 MED ORDER — OXYCODONE-ACETAMINOPHEN 5-325 MG PO TABS
1.0000 | ORAL_TABLET | ORAL | Status: DC | PRN
  Administered 2017-12-26 (×2): 1 via ORAL
  Filled 2017-12-26 (×2): qty 1

## 2017-12-26 NOTE — Social Work (Signed)
CSW f/u for disposition.  CSW contacted SNF and they indicated that they needed updated PT notes for [employer] [facility].  CSW sent same and is f/u on [employer].  Elissa Hefty, LCSW Clinical Social Worker [phone]

## 2017-12-26 NOTE — Progress Notes (Signed)
Physical Therapy Treatment Patient Details Name: Carolyn Rowland MRN: [id number] DOB: Oct 04, 1940 Today's Date: 12/26/2017    History of Present Illness Pt is a 77 y.o. female s/p R THA (direct anterior approach) on 12/20/17. PMH includes HTN, CAD, vertigo.    PT Comments    Pt performed gait trainign and functional mobility.  She reports increased tightness on R lateral though that resolved with gentle over pressure during heel slides.  Pt remains to require SNF placement as she continues to progress slowly with min guard assistance.     Follow Up Recommendations  Follow surgeon's recommendation for DC plan and follow-up therapies;SNF;Supervision/Assistance - 24 hour     Equipment Recommendations  Wheelchair (measurements PT);Wheelchair cushion (measurements PT)    Recommendations for Other Services Rehab consult     Precautions / Restrictions Precautions Precautions: Fall Restrictions Weight Bearing Restrictions: Yes RLE Weight Bearing: Weight bearing as tolerated    Mobility  Bed Mobility               General bed mobility comments: pt is in recliner chair on arrival.    Transfers Overall transfer level: Needs assistance Equipment used: Rolling walker (2 wheeled) Transfers: Sit to/from Stand Sit to Stand: Min guard Stand pivot transfers: Min guard       General transfer comment: min guard for transition from chair   Ambulation/Gait Ambulation/Gait assistance: Min guard Gait Distance (Feet): 60 Feet Assistive device: Rolling walker (2 wheeled) Gait Pattern/deviations: Decreased stride length;Decreased weight shift to right;Antalgic;Trunk flexed;Decreased dorsiflexion - right;Step-through pattern Gait velocity: decreased   General Gait Details: Pt remains to require cues for upper trunk control and weight bearing.  Continues to complain of feeling anxious which limits activity.     Stairs             Wheelchair Mobility    Modified Rankin (Stroke  Patients Only)       Balance Overall balance assessment: Needs assistance Sitting-balance support: Feet supported Sitting balance-Leahy Scale: Good       Standing balance-Leahy Scale: Poor                              Cognition Arousal/Alertness: Awake/alert Behavior During Therapy: Flat affect Overall Cognitive Status: Within Functional Limits for tasks assessed                                 General Comments: WFL, but intermittently slow to respond and follow commands; increased time for tasks      Exercises Total Joint Exercises Ankle Circles/Pumps: AROM;Both;Supine;20 reps Quad Sets: AROM;Right;10 reps;Supine Short Arc Quad: AROM;Right;10 reps;Supine Hip ABduction/ADduction: Right;10 reps;Supine;AROM Long [employer]: Right;10 reps;Seated;AROM    General Comments        Pertinent Vitals/Pain Pain Assessment: 0-10 Pain Score: 6  Pain Location: R hip with therex/ROM Pain Descriptors / Indicators: Sore Pain Intervention(s): Monitored during session;Repositioned    Home Living                      Prior Function            PT Goals (current goals can now be found in the care plan section) Acute Rehab PT Goals Patient Stated Goal: decrease pain Potential to Achieve Goals: Fair Progress towards PT goals: Progressing toward goals    Frequency    7X/week      PT  Plan Current plan remains appropriate    Co-evaluation              AM-PAC PT "6 Clicks" Daily Activity  Outcome Measure  Difficulty turning over in bed (including adjusting bedclothes, sheets and blankets)?: A Little Difficulty moving from lying on back to sitting on the side of the bed? : A Little Difficulty sitting down on and standing up from a chair with arms (e.g., wheelchair, bedside commode, etc,.)?: A Little Help needed moving to and from a bed to chair (including a wheelchair)?: A Little Help needed walking in hospital room?: A Little Help  needed climbing 3-5 steps with a railing? : A Lot 6 Click Score: 17    End of Session Equipment Utilized During Treatment: Gait belt Activity Tolerance: Patient tolerated treatment well Patient left: with call bell/phone within reach;in chair Nurse Communication: Mobility status PT Visit Diagnosis: Other abnormalities of gait and mobility (R26.89);Pain;Difficulty in walking, not elsewhere classified (R26.2) Pain - Right/Left: Right Pain - part of body: Hip     Time: 1127-1201 PT Time Calculation (min) (ACUTE ONLY): 34 min  Charges:  $Gait Training: 8-22 mins $Therapeutic Exercise: 8-22 mins                    G Codes:       Governor Rooks, PTA pager [phone]    Cristela Blue 12/26/2017, 3:22 PM

## 2017-12-26 NOTE — Progress Notes (Addendum)
   Subjective: 7 Days Post-Op Procedure(s) (LRB): RIGHT TOTAL HIP ARTHROPLASTY ANTERIOR APPROACH (Right) Patient reports pain as moderate.    Objective: Vital signs in last 24 hours: Temp:  [98.2 F (36.8 C)-98.3 F (36.8 C)] 98.2 F (36.8 C) (06/23 2011) Pulse Rate:  [73-77] 77 (06/23 2011) Resp:  [16] 16 (06/23 2011) BP: (112-124)/(64-66) 124/66 (06/23 2011) SpO2:  [98 %-99 %] 98 % (06/23 2011)  Intake/Output from previous day: 06/23 0701 - 06/24 0700 In: 960 [P.O.:960] Out: -  Intake/Output this shift: No intake/output data recorded.  No results for input(s): HGB in the last 72 hours. No results for input(s): WBC, RBC, HCT, PLT in the last 72 hours. No results for input(s): NA, K, CL, CO2, BUN, CREATININE, GLUCOSE, CALCIUM in the last 72 hours. No results for input(s): LABPT, INR in the last 72 hours.  Neurologically intact No results found.  Assessment/Plan: 7 Days Post-Op Procedure(s) (LRB): RIGHT TOTAL HIP ARTHROPLASTY ANTERIOR APPROACH (Right) Discharge to SNF no change in status since 6/22 D/c summary except continued work and slow progress with PT.  Ready for transfer to SNF  Carolyn Rowland 12/26/2017, 7:57 AM

## 2017-12-26 NOTE — Plan of Care (Signed)
  Problem: Education: Goal: Knowledge of General Education information will improve Outcome: Progressing   Problem: Clinical Measurements: Goal: Will remain free from infection Outcome: Progressing   Problem: Activity: Goal: Risk for activity intolerance will decrease Outcome: Progressing   Problem: Nutrition: Goal: Adequate nutrition will be maintained Outcome: Progressing   Problem: Elimination: Goal: Will not experience complications related to bowel motility Outcome: Progressing   Problem: Pain Managment: Goal: General experience of comfort will improve Outcome: Progressing

## 2017-12-26 NOTE — Social Work (Addendum)
CSW received call from SNF who advised that patient was denied by [facility] for SNF placement. SNF indicated that doctor can complete peer to peer and it must be scheduled before noon tomorrow at [phone].   CSW discussed with patient at bedside. Patient appears agreeable with going home. She voiced concern for getting in house with two front steps. CSW will advise clinicals staff.  CSW requested floor RN to see if PT can work with patient on stairs today.   Elissa Hefty, LCSW Clinical Social Worker [phone]

## 2017-12-27 NOTE — Progress Notes (Signed)
Occupational Therapy Treatment Patient Details Name: Carolyn Rowland MRN: [id number] DOB: 1941/05/14 Today's Date: 12/27/2017    History of present illness Pt is a 77 y.o. female s/p R THA (direct anterior approach) on 12/20/17. PMH includes HTN, CAD, vertigo.   OT comments  Patient progressing well.  Plans to dc home today with family support.  Demonstrates ability to complete toilet transfers with supervision using RW and 3:1 commode and toileting with min assist.  Educated on use of AE for LB dressing, patient excited about increased independence with equipment; provided written handout on recommended pieces.  Patient demonstrating improved mobility speed and safety, but continue to recommend 24/7 assistance at discharge for safety.  Plan to have HHOT assess and recommend needs for tub shower, as patient declines need to practice prior to dc (she plans to complete basin baths).  Will continue to follow while admitted.     Follow Up Recommendations  Supervision/Assistance - 24 hour;Follow surgeon's recommendation for DC plan and follow-up therapies    Equipment Recommendations  3 in 1 bedside commode(HHOT to assess tub/shower)    Recommendations for Other Services      Precautions / Restrictions Precautions Precautions: Fall Restrictions Weight Bearing Restrictions: Yes RLE Weight Bearing: Weight bearing as tolerated       Mobility Bed Mobility               General bed mobility comments: seated in recliner upon entry  Transfers Overall transfer level: Needs assistance Equipment used: Rolling walker (2 wheeled) Transfers: Sit to/from Stand Sit to Stand: Supervision         General transfer comment: supervision for safety with 3:1 commode and recliner transfers    Balance Overall balance assessment: Needs assistance Sitting-balance support: Feet supported;No upper extremity supported Sitting balance-Leahy Scale: Good     Standing balance support: During  functional activity;Single extremity supported Standing balance-Leahy Scale: Fair Standing balance comment: reliant on 1 UE support at least                            ADL either performed or assessed with clinical judgement   ADL Overall ADL's : Needs assistance/impaired     Grooming: Wash/dry hands;Standing;Supervision/safety       Lower Body Bathing: Min guard;Sit to/from stand;With adaptive equipment Lower Body Bathing Details (indicate cue type and reason): reviewed safety bathing seated, reports will complete basin baths at dc; edu on long sponge and compensatory techniques     Lower Body Dressing: Minimal assistance;With adaptive equipment;Cueing for compensatory techniques;Sit to/from stand Lower Body Dressing Details (indicate cue type and reason): reviewed AE for LB dressing, provided written handout on equipment to purchase; able to return demonstrate use with supervision  Toilet Transfer: Supervision/safety;Cueing for safety;Ambulation;BSC;RW Toilet Transfer Details (indicate cue type and reason): 3:1 over commode, simulate setup at home; good safety awareness  Toileting- Clothing Manipulation and Hygiene: Minimal assistance;Sit to/from stand Toileting - Clothing Manipulation Details (indicate cue type and reason): assist for safety/balance, slight mgmt of clothing up   Tub/Shower Transfer Details (indicate cue type and reason): pt declines, will sponge bathe at dc; edu on rec's to have Lamb review prior to attempting Functional mobility during ADLs: Supervision/safety;Min guard;Rolling walker(min guard inially, fading to supervision ) General ADL Comments: increased speed of mobility today, increased mobility of R LE, improved recall of hand placement and safety      Vision       Perception  Praxis      Cognition Arousal/Alertness: Awake/alert Behavior During Therapy: WFL for tasks assessed/performed Overall Cognitive Status: Within Functional  Limits for tasks assessed                                 General Comments: improved affect today        Exercises     Shoulder Instructions       General Comments      Pertinent Vitals/ Pain       Pain Assessment: Faces Faces Pain Scale: Hurts little more Pain Location: R hip with mobility Pain Descriptors / Indicators: Sore Pain Intervention(s): Limited activity within patient's tolerance;Repositioned  Home Living                                          Prior Functioning/Environment              Frequency  Min 2X/week        Progress Toward Goals  OT Goals(current goals can now be found in the care plan section)  Progress towards OT goals: Progressing toward goals     Plan Discharge plan remains appropriate;Frequency remains appropriate    Co-evaluation                 AM-PAC PT "6 Clicks" Daily Activity     Outcome Measure   Help from another person eating meals?: None Help from another person taking care of personal grooming?: A Little Help from another person toileting, which includes using toliet, bedpan, or urinal?: A Little Help from another person bathing (including washing, rinsing, drying)?: A Little Help from another person to put on and taking off regular upper body clothing?: A Little Help from another person to put on and taking off regular lower body clothing?: A Little 6 Click Score: 19    End of Session Equipment Utilized During Treatment: Rolling walker;Gait belt  OT Visit Diagnosis: Unsteadiness on feet (R26.81);Other abnormalities of gait and mobility (R26.89);Pain Pain - Right/Left: Right Pain - part of body: Hip   Activity Tolerance Patient tolerated treatment well;No increased pain   Patient Left with call bell/phone within reach;in chair   Nurse Communication Mobility status        Time: 4585-9292 OT Time Calculation (min): 23 min  Charges: OT General Charges $OT Visit: 1  Visit OT Treatments $Self Care/Home Management : 23-37 mins  Delight Stare, OTR/L  Pager [place] 12/27/2017, 1:17 PM

## 2017-12-27 NOTE — Progress Notes (Signed)
Pt was ambulating with walker, right hip pain well controlled, with dressing dry and intact. Discharge instructions given to pt. Discharged to home accompanied by spouse.

## 2017-12-27 NOTE — Progress Notes (Addendum)
   Subjective: 8 Days Post-Op Procedure(s) (LRB): RIGHT TOTAL HIP ARTHROPLASTY ANTERIOR APPROACH (Right) Patient reports pain as mild.    Objective: Vital signs in last 24 hours: Temp:  [97.8 F (36.6 C)-98.9 F (37.2 C)] 98.8 F (37.1 C) (06/25 0525) Pulse Rate:  [75-85] 75 (06/25 0525) Resp:  [14-18] 14 (06/25 0525) BP: (98-147)/(54-70) 143/70 (06/25 0525) SpO2:  [99 %-100 %] 100 % (06/25 0525)  Intake/Output from previous day: 06/24 0701 - 06/25 0700 In: 360 [P.O.:360] Out: -  Intake/Output this shift: No intake/output data recorded.  No results for input(s): HGB in the last 72 hours. No results for input(s): WBC, RBC, HCT, PLT in the last 72 hours. No results for input(s): NA, K, CL, CO2, BUN, CREATININE, GLUCOSE, CALCIUM in the last 72 hours. No results for input(s): LABPT, INR in the last 72 hours.  Neurologically intact No results found.  Assessment/Plan: 8 Days Post-Op Procedure(s) (LRB): RIGHT TOTAL HIP ARTHROPLASTY ANTERIOR APPROACH (Right) Discharge home with home health, Rx on chart, office 1-2 wks has 2 steps to get in house will work on steps today before discharge home.   Carolyn Rowland 12/27/2017, 7:49 AM

## 2017-12-27 NOTE — Progress Notes (Signed)
Physical Therapy Treatment Patient Details Name: Carolyn Rowland MRN: [id number] DOB: 1940/07/09 Today's Date: 12/27/2017    History of Present Illness Pt is a 77 y.o. female s/p R THA (direct anterior approach) on 12/20/17. PMH includes HTN, CAD, vertigo.    PT Comments    Pt progressing towards physical therapy goals. Plan is for d/c home this afternoon and focus of PT session was stair training. Pt was able to complete a total of 7 stairs throughout session with min assist for balance support and safety. She was also educated on HEP and car transfer. Pt's husband was present when we returned to the room and he was verbally educated in guarding technique and proper sequencing to assist pt in entering home. At end of session, pt and husband do not report any further questions, and pt states she is ready for d/c. RN updated upon completion of session. Will continue to follow.   Follow Up Recommendations  Follow surgeon's recommendation for DC plan and follow-up therapies;Supervision/Assistance - 24 hour     Equipment Recommendations  3in1 (PT)    Recommendations for Other Services       Precautions / Restrictions Precautions Precautions: Fall Restrictions Weight Bearing Restrictions: Yes RLE Weight Bearing: Weight bearing as tolerated    Mobility  Bed Mobility               General bed mobility comments: seated in recliner upon entry  Transfers Overall transfer level: Needs assistance Equipment used: Rolling walker (2 wheeled) Transfers: Sit to/from Stand Sit to Stand: Supervision         General transfer comment: Close supervision for safety. Pt was able to power-up to full stand without assist.   Ambulation/Gait Ambulation/Gait assistance: Min guard Gait Distance (Feet): 125 Feet Assistive device: Rolling walker (2 wheeled) Gait Pattern/deviations: Decreased stride length;Decreased weight shift to right;Antalgic;Trunk flexed;Decreased dorsiflexion -  right;Step-through pattern Gait velocity: decreased Gait velocity interpretation: <1.31 ft/sec, indicative of household ambulator General Gait Details: VC's for improved posture, increased heel strike, and general safety with RW use.    Stairs Stairs: Yes Stairs assistance: Min assist Stair Management: Step to pattern;Forwards;One rail Right;Two rails Number of Stairs: 7 General stair comments: Initially practiced with B rails to get the feel for stepping up onto a large step. Then progressed to BUE's on R railing to simulate home environment. Pt was able to complete with assist for balance support and safety.    Wheelchair Mobility    Modified Rankin (Stroke Patients Only)       Balance Overall balance assessment: Needs assistance Sitting-balance support: Feet supported;No upper extremity supported Sitting balance-Leahy Scale: Good     Standing balance support: During functional activity;Single extremity supported Standing balance-Leahy Scale: Fair Standing balance comment: reliant on 1 UE support at least                             Cognition Arousal/Alertness: Awake/alert Behavior During Therapy: Flat affect Overall Cognitive Status: Within Functional Limits for tasks assessed                                 General Comments: improved affect today      Exercises      General Comments        Pertinent Vitals/Pain Pain Assessment: Faces Faces Pain Scale: Hurts a little bit Pain Location: R hip with mobility Pain  Descriptors / Indicators: Sore Pain Intervention(s): Monitored during session    Home Living                      Prior Function            PT Goals (current goals can now be found in the care plan section) Acute Rehab PT Goals Patient Stated Goal: decrease pain PT Goal Formulation: With patient Time For Goal Achievement: 01/03/18 Potential to Achieve Goals: Fair Progress towards PT goals: Progressing toward  goals    Frequency    7X/week      PT Plan Current plan remains appropriate    Co-evaluation              AM-PAC PT "6 Clicks" Daily Activity  Outcome Measure  Difficulty turning over in bed (including adjusting bedclothes, sheets and blankets)?: A Little Difficulty moving from lying on back to sitting on the side of the bed? : A Little Difficulty sitting down on and standing up from a chair with arms (e.g., wheelchair, bedside commode, etc,.)?: A Little Help needed moving to and from a bed to chair (including a wheelchair)?: A Little Help needed walking in hospital room?: A Little Help needed climbing 3-5 steps with a railing? : A Lot 6 Click Score: 17    End of Session Equipment Utilized During Treatment: Gait belt Activity Tolerance: Patient tolerated treatment well Patient left: with call bell/phone within reach;in chair Nurse Communication: Mobility status PT Visit Diagnosis: Other abnormalities of gait and mobility (R26.89);Pain;Difficulty in walking, not elsewhere classified (R26.2) Pain - Right/Left: Right Pain - part of body: Hip     Time: 8676-1950 PT Time Calculation (min) (ACUTE ONLY): 24 min  Charges:  $Gait Training: 23-37 mins                    G Codes:       Rolinda Roan, PT, DPT Acute Rehabilitation Services Pager: [phone]    Thelma Comp 12/27/2017, 2:32 PM

## 2017-12-28 ENCOUNTER — Telehealth (INDEPENDENT_AMBULATORY_CARE_PROVIDER_SITE_OTHER): Payer: Self-pay | Admitting: Orthopaedic Surgery

## 2017-12-28 DIAGNOSIS — Z471 Aftercare following joint replacement surgery: Secondary | ICD-10-CM | POA: Diagnosis not present

## 2017-12-28 DIAGNOSIS — M19049 Primary osteoarthritis, unspecified hand: Secondary | ICD-10-CM | POA: Diagnosis not present

## 2017-12-28 DIAGNOSIS — I25119 Atherosclerotic heart disease of native coronary artery with unspecified angina pectoris: Secondary | ICD-10-CM | POA: Diagnosis not present

## 2017-12-28 DIAGNOSIS — I10 Essential (primary) hypertension: Secondary | ICD-10-CM | POA: Diagnosis not present

## 2017-12-28 DIAGNOSIS — Z96641 Presence of right artificial hip joint: Secondary | ICD-10-CM | POA: Diagnosis not present

## 2017-12-28 DIAGNOSIS — M4807 Spinal stenosis, lumbosacral region: Secondary | ICD-10-CM | POA: Diagnosis not present

## 2017-12-28 DIAGNOSIS — I350 Nonrheumatic aortic (valve) stenosis: Secondary | ICD-10-CM | POA: Diagnosis not present

## 2017-12-28 DIAGNOSIS — M1612 Unilateral primary osteoarthritis, left hip: Secondary | ICD-10-CM | POA: Diagnosis not present

## 2017-12-28 DIAGNOSIS — E7800 Pure hypercholesterolemia, unspecified: Secondary | ICD-10-CM | POA: Diagnosis not present

## 2017-12-28 DIAGNOSIS — I700 Atherosclerosis of aorta: Secondary | ICD-10-CM | POA: Diagnosis not present

## 2017-12-28 NOTE — Telephone Encounter (Signed)
[facility]. Recommend 3X per week times 3 wks. thanks

## 2017-12-28 NOTE — Telephone Encounter (Signed)
Ok for verbal orders ?

## 2017-12-28 NOTE — Telephone Encounter (Signed)
I called [facility] Kidney and advised.

## 2017-12-28 NOTE — Telephone Encounter (Signed)
Mary, physical therapist with [facility] is calling to review plan of care and also request verbal orders 2 x for the first week 3 x for 1 week 2 x for 1 week  CB # [phone]

## 2017-12-29 DIAGNOSIS — I350 Nonrheumatic aortic (valve) stenosis: Secondary | ICD-10-CM | POA: Diagnosis not present

## 2017-12-29 DIAGNOSIS — M4807 Spinal stenosis, lumbosacral region: Secondary | ICD-10-CM | POA: Diagnosis not present

## 2017-12-29 DIAGNOSIS — M19049 Primary osteoarthritis, unspecified hand: Secondary | ICD-10-CM | POA: Diagnosis not present

## 2017-12-29 DIAGNOSIS — Z96641 Presence of right artificial hip joint: Secondary | ICD-10-CM | POA: Diagnosis not present

## 2017-12-29 DIAGNOSIS — Z471 Aftercare following joint replacement surgery: Secondary | ICD-10-CM | POA: Diagnosis not present

## 2017-12-29 DIAGNOSIS — I700 Atherosclerosis of aorta: Secondary | ICD-10-CM | POA: Diagnosis not present

## 2017-12-29 DIAGNOSIS — M1612 Unilateral primary osteoarthritis, left hip: Secondary | ICD-10-CM | POA: Diagnosis not present

## 2017-12-29 DIAGNOSIS — E7800 Pure hypercholesterolemia, unspecified: Secondary | ICD-10-CM | POA: Diagnosis not present

## 2017-12-29 DIAGNOSIS — I10 Essential (primary) hypertension: Secondary | ICD-10-CM | POA: Diagnosis not present

## 2017-12-29 DIAGNOSIS — I25119 Atherosclerotic heart disease of native coronary artery with unspecified angina pectoris: Secondary | ICD-10-CM | POA: Diagnosis not present

## 2017-12-30 ENCOUNTER — Inpatient Hospital Stay (INDEPENDENT_AMBULATORY_CARE_PROVIDER_SITE_OTHER): Payer: Medicare HMO | Admitting: Orthopaedic Surgery

## 2017-12-31 DIAGNOSIS — M4807 Spinal stenosis, lumbosacral region: Secondary | ICD-10-CM | POA: Diagnosis not present

## 2017-12-31 DIAGNOSIS — M1612 Unilateral primary osteoarthritis, left hip: Secondary | ICD-10-CM | POA: Diagnosis not present

## 2017-12-31 DIAGNOSIS — I10 Essential (primary) hypertension: Secondary | ICD-10-CM | POA: Diagnosis not present

## 2017-12-31 DIAGNOSIS — M19049 Primary osteoarthritis, unspecified hand: Secondary | ICD-10-CM | POA: Diagnosis not present

## 2017-12-31 DIAGNOSIS — I700 Atherosclerosis of aorta: Secondary | ICD-10-CM | POA: Diagnosis not present

## 2017-12-31 DIAGNOSIS — I25119 Atherosclerotic heart disease of native coronary artery with unspecified angina pectoris: Secondary | ICD-10-CM | POA: Diagnosis not present

## 2017-12-31 DIAGNOSIS — Z96641 Presence of right artificial hip joint: Secondary | ICD-10-CM | POA: Diagnosis not present

## 2017-12-31 DIAGNOSIS — I350 Nonrheumatic aortic (valve) stenosis: Secondary | ICD-10-CM | POA: Diagnosis not present

## 2017-12-31 DIAGNOSIS — E7800 Pure hypercholesterolemia, unspecified: Secondary | ICD-10-CM | POA: Diagnosis not present

## 2017-12-31 DIAGNOSIS — Z471 Aftercare following joint replacement surgery: Secondary | ICD-10-CM | POA: Diagnosis not present

## 2018-01-02 DIAGNOSIS — Z96641 Presence of right artificial hip joint: Secondary | ICD-10-CM | POA: Diagnosis not present

## 2018-01-02 DIAGNOSIS — Z471 Aftercare following joint replacement surgery: Secondary | ICD-10-CM | POA: Diagnosis not present

## 2018-01-02 DIAGNOSIS — M1612 Unilateral primary osteoarthritis, left hip: Secondary | ICD-10-CM | POA: Diagnosis not present

## 2018-01-02 DIAGNOSIS — E7800 Pure hypercholesterolemia, unspecified: Secondary | ICD-10-CM | POA: Diagnosis not present

## 2018-01-02 DIAGNOSIS — I700 Atherosclerosis of aorta: Secondary | ICD-10-CM | POA: Diagnosis not present

## 2018-01-02 DIAGNOSIS — M19049 Primary osteoarthritis, unspecified hand: Secondary | ICD-10-CM | POA: Diagnosis not present

## 2018-01-02 DIAGNOSIS — I25119 Atherosclerotic heart disease of native coronary artery with unspecified angina pectoris: Secondary | ICD-10-CM | POA: Diagnosis not present

## 2018-01-02 DIAGNOSIS — I10 Essential (primary) hypertension: Secondary | ICD-10-CM | POA: Diagnosis not present

## 2018-01-02 DIAGNOSIS — M4807 Spinal stenosis, lumbosacral region: Secondary | ICD-10-CM | POA: Diagnosis not present

## 2018-01-02 DIAGNOSIS — I350 Nonrheumatic aortic (valve) stenosis: Secondary | ICD-10-CM | POA: Diagnosis not present

## 2018-01-03 DIAGNOSIS — I10 Essential (primary) hypertension: Secondary | ICD-10-CM | POA: Diagnosis not present

## 2018-01-03 DIAGNOSIS — I350 Nonrheumatic aortic (valve) stenosis: Secondary | ICD-10-CM | POA: Diagnosis not present

## 2018-01-03 DIAGNOSIS — Z471 Aftercare following joint replacement surgery: Secondary | ICD-10-CM | POA: Diagnosis not present

## 2018-01-03 DIAGNOSIS — M1612 Unilateral primary osteoarthritis, left hip: Secondary | ICD-10-CM | POA: Diagnosis not present

## 2018-01-03 DIAGNOSIS — M19049 Primary osteoarthritis, unspecified hand: Secondary | ICD-10-CM | POA: Diagnosis not present

## 2018-01-03 DIAGNOSIS — I700 Atherosclerosis of aorta: Secondary | ICD-10-CM | POA: Diagnosis not present

## 2018-01-03 DIAGNOSIS — E7800 Pure hypercholesterolemia, unspecified: Secondary | ICD-10-CM | POA: Diagnosis not present

## 2018-01-03 DIAGNOSIS — I25119 Atherosclerotic heart disease of native coronary artery with unspecified angina pectoris: Secondary | ICD-10-CM | POA: Diagnosis not present

## 2018-01-03 DIAGNOSIS — M4807 Spinal stenosis, lumbosacral region: Secondary | ICD-10-CM | POA: Diagnosis not present

## 2018-01-03 DIAGNOSIS — Z96641 Presence of right artificial hip joint: Secondary | ICD-10-CM | POA: Diagnosis not present

## 2018-01-04 ENCOUNTER — Ambulatory Visit (INDEPENDENT_AMBULATORY_CARE_PROVIDER_SITE_OTHER): Payer: Medicare HMO | Admitting: Surgery

## 2018-01-04 ENCOUNTER — Encounter (INDEPENDENT_AMBULATORY_CARE_PROVIDER_SITE_OTHER): Payer: Self-pay | Admitting: Surgery

## 2018-01-04 ENCOUNTER — Ambulatory Visit (INDEPENDENT_AMBULATORY_CARE_PROVIDER_SITE_OTHER): Payer: Medicare HMO

## 2018-01-04 VITALS — BP 109/65 | HR 77 | Ht 63.0 in | Wt 205.0 lb

## 2018-01-04 DIAGNOSIS — M1611 Unilateral primary osteoarthritis, right hip: Secondary | ICD-10-CM

## 2018-01-04 NOTE — Progress Notes (Signed)
77 year old white female who is 2-week status post right total hip replacement returns.  States that she is doing well.  Making progress with home health PT.   Exam Very pleasant white female alert and oriented in no acute distress.  Today hip staples removed and Steri-Strips applied.  No drainage or signs of infection.  She is ambulating well with her walker.     X-rays Good alignment and seating of her prosthesis.  No complicating features.  Plan Patient continue home health PT.  Final day of therapy is scheduled for July 12.  I made a note asking the physical therapist to contact me the final day to give me an update and I will make decision as to whether or not she needs outpatient therapy at that time.  Follow-up with [place] in 4 weeks for recheck.

## 2018-01-05 DIAGNOSIS — M19049 Primary osteoarthritis, unspecified hand: Secondary | ICD-10-CM | POA: Diagnosis not present

## 2018-01-05 DIAGNOSIS — Z471 Aftercare following joint replacement surgery: Secondary | ICD-10-CM | POA: Diagnosis not present

## 2018-01-05 DIAGNOSIS — M1612 Unilateral primary osteoarthritis, left hip: Secondary | ICD-10-CM | POA: Diagnosis not present

## 2018-01-05 DIAGNOSIS — Z96641 Presence of right artificial hip joint: Secondary | ICD-10-CM | POA: Diagnosis not present

## 2018-01-05 DIAGNOSIS — I700 Atherosclerosis of aorta: Secondary | ICD-10-CM | POA: Diagnosis not present

## 2018-01-05 DIAGNOSIS — E7800 Pure hypercholesterolemia, unspecified: Secondary | ICD-10-CM | POA: Diagnosis not present

## 2018-01-05 DIAGNOSIS — M4807 Spinal stenosis, lumbosacral region: Secondary | ICD-10-CM | POA: Diagnosis not present

## 2018-01-05 DIAGNOSIS — I10 Essential (primary) hypertension: Secondary | ICD-10-CM | POA: Diagnosis not present

## 2018-01-05 DIAGNOSIS — I350 Nonrheumatic aortic (valve) stenosis: Secondary | ICD-10-CM | POA: Diagnosis not present

## 2018-01-05 DIAGNOSIS — I25119 Atherosclerotic heart disease of native coronary artery with unspecified angina pectoris: Secondary | ICD-10-CM | POA: Diagnosis not present

## 2018-01-11 DIAGNOSIS — M4807 Spinal stenosis, lumbosacral region: Secondary | ICD-10-CM | POA: Diagnosis not present

## 2018-01-11 DIAGNOSIS — Z471 Aftercare following joint replacement surgery: Secondary | ICD-10-CM | POA: Diagnosis not present

## 2018-01-11 DIAGNOSIS — Z96641 Presence of right artificial hip joint: Secondary | ICD-10-CM | POA: Diagnosis not present

## 2018-01-11 DIAGNOSIS — E7800 Pure hypercholesterolemia, unspecified: Secondary | ICD-10-CM | POA: Diagnosis not present

## 2018-01-11 DIAGNOSIS — M19049 Primary osteoarthritis, unspecified hand: Secondary | ICD-10-CM | POA: Diagnosis not present

## 2018-01-11 DIAGNOSIS — I10 Essential (primary) hypertension: Secondary | ICD-10-CM | POA: Diagnosis not present

## 2018-01-11 DIAGNOSIS — I350 Nonrheumatic aortic (valve) stenosis: Secondary | ICD-10-CM | POA: Diagnosis not present

## 2018-01-11 DIAGNOSIS — M1612 Unilateral primary osteoarthritis, left hip: Secondary | ICD-10-CM | POA: Diagnosis not present

## 2018-01-11 DIAGNOSIS — I25119 Atherosclerotic heart disease of native coronary artery with unspecified angina pectoris: Secondary | ICD-10-CM | POA: Diagnosis not present

## 2018-01-11 DIAGNOSIS — I700 Atherosclerosis of aorta: Secondary | ICD-10-CM | POA: Diagnosis not present

## 2018-01-12 ENCOUNTER — Telehealth (INDEPENDENT_AMBULATORY_CARE_PROVIDER_SITE_OTHER): Payer: Self-pay | Admitting: Surgery

## 2018-01-12 NOTE — Telephone Encounter (Signed)
Please advise. Per your last note, you wanted PT to contact you in regards to patient.

## 2018-01-12 NOTE — Telephone Encounter (Signed)
Mary with [facility] is requesting 2 more physical therapy visits next week for this patient.  CB # [phone]

## 2018-01-13 DIAGNOSIS — I25119 Atherosclerotic heart disease of native coronary artery with unspecified angina pectoris: Secondary | ICD-10-CM | POA: Diagnosis not present

## 2018-01-13 DIAGNOSIS — M19049 Primary osteoarthritis, unspecified hand: Secondary | ICD-10-CM | POA: Diagnosis not present

## 2018-01-13 DIAGNOSIS — I700 Atherosclerosis of aorta: Secondary | ICD-10-CM | POA: Diagnosis not present

## 2018-01-13 DIAGNOSIS — E7800 Pure hypercholesterolemia, unspecified: Secondary | ICD-10-CM | POA: Diagnosis not present

## 2018-01-13 DIAGNOSIS — Z471 Aftercare following joint replacement surgery: Secondary | ICD-10-CM | POA: Diagnosis not present

## 2018-01-13 DIAGNOSIS — Z96641 Presence of right artificial hip joint: Secondary | ICD-10-CM | POA: Diagnosis not present

## 2018-01-13 DIAGNOSIS — I10 Essential (primary) hypertension: Secondary | ICD-10-CM | POA: Diagnosis not present

## 2018-01-13 DIAGNOSIS — M1612 Unilateral primary osteoarthritis, left hip: Secondary | ICD-10-CM | POA: Diagnosis not present

## 2018-01-13 DIAGNOSIS — M4807 Spinal stenosis, lumbosacral region: Secondary | ICD-10-CM | POA: Diagnosis not present

## 2018-01-13 DIAGNOSIS — I350 Nonrheumatic aortic (valve) stenosis: Secondary | ICD-10-CM | POA: Diagnosis not present

## 2018-01-16 ENCOUNTER — Telehealth (INDEPENDENT_AMBULATORY_CARE_PROVIDER_SITE_OTHER): Payer: Self-pay | Admitting: Orthopaedic Surgery

## 2018-01-16 NOTE — Telephone Encounter (Signed)
Ok for orders? 

## 2018-01-16 NOTE — Telephone Encounter (Signed)
Mary (PT) with [facility] called asked if Dr Lorin Mercy can give verbal order to see patient 2 more times this week. The number to contact [facility] Kidney is [phone]

## 2018-01-17 NOTE — Telephone Encounter (Signed)
IC advised.  

## 2018-01-17 NOTE — Telephone Encounter (Signed)
OK - thanks

## 2018-01-18 DIAGNOSIS — I25119 Atherosclerotic heart disease of native coronary artery with unspecified angina pectoris: Secondary | ICD-10-CM | POA: Diagnosis not present

## 2018-01-18 DIAGNOSIS — Z96641 Presence of right artificial hip joint: Secondary | ICD-10-CM | POA: Diagnosis not present

## 2018-01-18 DIAGNOSIS — M19049 Primary osteoarthritis, unspecified hand: Secondary | ICD-10-CM | POA: Diagnosis not present

## 2018-01-18 DIAGNOSIS — Z471 Aftercare following joint replacement surgery: Secondary | ICD-10-CM | POA: Diagnosis not present

## 2018-01-18 DIAGNOSIS — M1612 Unilateral primary osteoarthritis, left hip: Secondary | ICD-10-CM | POA: Diagnosis not present

## 2018-01-18 DIAGNOSIS — I350 Nonrheumatic aortic (valve) stenosis: Secondary | ICD-10-CM | POA: Diagnosis not present

## 2018-01-18 DIAGNOSIS — E7800 Pure hypercholesterolemia, unspecified: Secondary | ICD-10-CM | POA: Diagnosis not present

## 2018-01-18 DIAGNOSIS — I700 Atherosclerosis of aorta: Secondary | ICD-10-CM | POA: Diagnosis not present

## 2018-01-18 DIAGNOSIS — I10 Essential (primary) hypertension: Secondary | ICD-10-CM | POA: Diagnosis not present

## 2018-01-18 DIAGNOSIS — M4807 Spinal stenosis, lumbosacral region: Secondary | ICD-10-CM | POA: Diagnosis not present

## 2018-01-20 ENCOUNTER — Telehealth (INDEPENDENT_AMBULATORY_CARE_PROVIDER_SITE_OTHER): Payer: Self-pay | Admitting: Orthopaedic Surgery

## 2018-01-20 DIAGNOSIS — E785 Hyperlipidemia, unspecified: Secondary | ICD-10-CM | POA: Diagnosis not present

## 2018-01-20 DIAGNOSIS — I25118 Atherosclerotic heart disease of native coronary artery with other forms of angina pectoris: Secondary | ICD-10-CM | POA: Diagnosis not present

## 2018-01-20 DIAGNOSIS — I10 Essential (primary) hypertension: Secondary | ICD-10-CM | POA: Diagnosis not present

## 2018-01-20 DIAGNOSIS — R69 Illness, unspecified: Secondary | ICD-10-CM | POA: Diagnosis not present

## 2018-01-20 DIAGNOSIS — M1990 Unspecified osteoarthritis, unspecified site: Secondary | ICD-10-CM | POA: Diagnosis not present

## 2018-01-20 NOTE — Telephone Encounter (Signed)
IC patient, no answer. LMVM advising per Dr Lorin Mercy.

## 2018-01-20 NOTE — Telephone Encounter (Signed)
Please advise 

## 2018-01-20 NOTE — Telephone Encounter (Signed)
Surgery 12/19/17  Right Total Hip Arthroplasty Anterior Approach      Pt called would like to know if she can drive

## 2018-01-20 NOTE — Telephone Encounter (Signed)
UCALL. Ok 6 WKS AFTER SURGERY SINCE IT IS HER DRIVING FOOT. Hal Morales

## 2018-01-25 ENCOUNTER — Other Ambulatory Visit (INDEPENDENT_AMBULATORY_CARE_PROVIDER_SITE_OTHER): Payer: Self-pay | Admitting: Surgery

## 2018-01-25 NOTE — Telephone Encounter (Signed)
Ok to stop now  Since one month post op

## 2018-01-25 NOTE — Telephone Encounter (Signed)
Please advise 

## 2018-01-25 NOTE — Telephone Encounter (Signed)
[place] patient

## 2018-02-01 ENCOUNTER — Ambulatory Visit (INDEPENDENT_AMBULATORY_CARE_PROVIDER_SITE_OTHER): Payer: Medicare HMO | Admitting: Orthopaedic Surgery

## 2018-02-01 ENCOUNTER — Encounter (INDEPENDENT_AMBULATORY_CARE_PROVIDER_SITE_OTHER): Payer: Self-pay | Admitting: Orthopaedic Surgery

## 2018-02-01 VITALS — BP 120/77 | HR 63 | Ht 63.0 in | Wt 205.0 lb

## 2018-02-01 DIAGNOSIS — Z96641 Presence of right artificial hip joint: Secondary | ICD-10-CM

## 2018-02-01 DIAGNOSIS — M160 Bilateral primary osteoarthritis of hip: Secondary | ICD-10-CM

## 2018-02-01 NOTE — Progress Notes (Signed)
Post-Op Visit Note   Patient: Carolyn Rowland           Date of Birth: [birth year]           MRN: [id number] Visit Date: 02/01/2018 PCP: Fanny Bien, MD   Assessment & Plan: Follow-up right total hip arthroplasty.  She is using her cane she can resume driving.  She took 1 pain pill several days ago otherwise she is off her pain medication.  She will continue to work on strengthening and walking.  Opposite left hip shows bone-on-bone changes but is not significantly symptomatic.  I will recheck her again in 3 months leg lengths are equal she is been working with therapy and is happy with the surgical result.  Chief Complaint:  Chief Complaint  Patient presents with  . Right Hip - Follow-up   Visit Diagnoses:  1. H/O total hip arthroplasty, right     Plan: Recheck 3 months.  She has some minimal discomfort with internal rotation of her degenerative left hip at this time.  She has had some increased pain in her left thigh down to her knee as she rehabbed her right total hip arthroplasty.  Recheck 3 months.  Follow-Up Instructions: No follow-ups on file.   Orders:  No orders of the defined types were placed in this encounter.  No orders of the defined types were placed in this encounter.   Imaging: No results found.  PMFS History: Patient Active Problem List   Diagnosis Date Noted  . Arthritis of right hip 12/19/2017  . Spinal stenosis of lumbosacral region 09/22/2017  . Bilateral primary osteoarthritis of hip 09/22/2017  . Unilateral primary osteoarthritis, right hip 03/16/2017  . CAP (community acquired pneumonia) 09/04/2014  . Leukocytosis 09/04/2014  . Hypokalemia 09/04/2014  . Breast cancer of upper-outer quadrant of right female breast ([place]) 08/27/2014  . Coronary atherosclerosis of native coronary artery 08/18/2013  . S/P PTCA (percutaneous transluminal coronary angioplasty) 08/17/2013   Past Medical History:  Diagnosis Date  . Anemia    in younger years    . Anginal pain ([place])   . Aortic stenosis    mild to moderate AS 07/2013  . Arthritis    "hands; legs" (08/17/2013)  . CAP (community acquired pneumonia)   . Coronary artery disease    6 stents in 2015  . GERD (gastroesophageal reflux disease)   . H/O hiatal hernia   . Heart murmur   . High cholesterol   . Hypertension   . Pneumonia 1970's   "once"  . Radiation 11/28/14-01/02/15   Right breast 50 Gray  . Vertigo     Family History  Problem Relation Age of Onset  . Cancer Brother   . Cancer Brother   . Heart attack Brother     Past Surgical History:  Procedure Laterality Date  . ABDOMINAL HYSTERECTOMY  1985  . APPENDECTOMY  1960  . BREAST LUMPECTOMY WITH NEEDLE LOCALIZATION AND AXILLARY SENTINEL LYMPH NODE BX Right 10/17/2014   Procedure: BREAST LUMPECTOMY WITH NEEDLE LOCALIZATION AND AXILLARY SENTINEL LYMPH NODE BX;  Surgeon: Jackolyn Confer, MD;  Location: [place];  Service: General;  Laterality: Right;  . CARDIAC CATHETERIZATION  08/07/2013  . COLONOSCOPY    . CORONARY ANGIOPLASTY WITH STENT PLACEMENT  08/17/2013   "6 stents"(08/17/2013)  . LEFT AND RIGHT HEART CATHETERIZATION WITH CORONARY ANGIOGRAM N/A 08/07/2013   Procedure: LEFT AND RIGHT HEART CATHETERIZATION WITH CORONARY ANGIOGRAM;  Surgeon: Laverda Page, MD;  Location: [facility] CATH LAB;  Service: Cardiovascular;  Laterality: N/A;  . PERCUTANEOUS CORONARY STENT INTERVENTION (PCI-S) N/A 08/17/2013   Procedure: PERCUTANEOUS CORONARY STENT INTERVENTION (PCI-S);  Surgeon: Laverda Page, MD;  Location: [facility] C[facility] LAB;  Service: Cardiovascular;  Laterality: N/A;  . TOTAL HIP ARTHROPLASTY Right 12/19/2017   Procedure: RIGHT TOTAL HIP ARTHROPLASTY ANTERIOR APPROACH;  Surgeon: Marybelle Killings, MD;  Location: [place];  Service: Orthopedics;  Laterality: Right;  . TUBAL LIGATION  1980   Social History   Occupational History  . Not on file  Tobacco Use  . Smoking status: Former Smoker    Packs/day: 1.00    Years: 52.00    Pack years:  52.00    Types: Cigarettes    Last attempt to quit: 04/04/2014    Years since quitting: 3.8  . Smokeless tobacco: Never Used  Substance and Sexual Activity  . Alcohol use: No    Comment: 08/17/2013 "quit drinking at age 13; never did drink much"  . Drug use: No  . Sexual activity: Never

## 2018-04-27 DIAGNOSIS — I25118 Atherosclerotic heart disease of native coronary artery with other forms of angina pectoris: Secondary | ICD-10-CM | POA: Diagnosis not present

## 2018-04-27 DIAGNOSIS — I350 Nonrheumatic aortic (valve) stenosis: Secondary | ICD-10-CM | POA: Diagnosis not present

## 2018-04-27 DIAGNOSIS — E785 Hyperlipidemia, unspecified: Secondary | ICD-10-CM | POA: Diagnosis not present

## 2018-04-27 DIAGNOSIS — M1990 Unspecified osteoarthritis, unspecified site: Secondary | ICD-10-CM | POA: Diagnosis not present

## 2018-04-27 DIAGNOSIS — I10 Essential (primary) hypertension: Secondary | ICD-10-CM | POA: Diagnosis not present

## 2018-04-27 DIAGNOSIS — R69 Illness, unspecified: Secondary | ICD-10-CM | POA: Diagnosis not present

## 2018-05-17 ENCOUNTER — Ambulatory Visit (INDEPENDENT_AMBULATORY_CARE_PROVIDER_SITE_OTHER): Payer: Medicare HMO | Admitting: Surgery

## 2018-05-18 DIAGNOSIS — I350 Nonrheumatic aortic (valve) stenosis: Secondary | ICD-10-CM | POA: Diagnosis not present

## 2018-05-18 DIAGNOSIS — I10 Essential (primary) hypertension: Secondary | ICD-10-CM | POA: Diagnosis not present

## 2018-05-18 DIAGNOSIS — E785 Hyperlipidemia, unspecified: Secondary | ICD-10-CM | POA: Diagnosis not present

## 2018-05-18 DIAGNOSIS — I25118 Atherosclerotic heart disease of native coronary artery with other forms of angina pectoris: Secondary | ICD-10-CM | POA: Diagnosis not present

## 2018-05-25 ENCOUNTER — Encounter (INDEPENDENT_AMBULATORY_CARE_PROVIDER_SITE_OTHER): Payer: Self-pay | Admitting: Surgery

## 2018-05-25 ENCOUNTER — Ambulatory Visit (INDEPENDENT_AMBULATORY_CARE_PROVIDER_SITE_OTHER): Payer: Medicare HMO

## 2018-05-25 ENCOUNTER — Ambulatory Visit (INDEPENDENT_AMBULATORY_CARE_PROVIDER_SITE_OTHER): Payer: Medicare HMO | Admitting: Surgery

## 2018-05-25 VITALS — BP 109/72 | HR 76 | Ht 63.0 in | Wt 205.0 lb

## 2018-05-25 DIAGNOSIS — M25562 Pain in left knee: Secondary | ICD-10-CM

## 2018-05-25 DIAGNOSIS — M1712 Unilateral primary osteoarthritis, left knee: Secondary | ICD-10-CM

## 2018-05-25 MED ORDER — METHYLPREDNISOLONE ACETATE 40 MG/ML IJ SUSP
80.0000 mg | INTRAMUSCULAR | Status: AC | PRN
  Administered 2018-05-25: 80 mg

## 2018-05-25 MED ORDER — BUPIVACAINE HCL 0.25 % IJ SOLN
6.0000 mL | INTRAMUSCULAR | Status: AC | PRN
Start: 2018-05-25 — End: 2018-05-25
  Administered 2018-05-25: 6 mL via INTRA_ARTICULAR

## 2018-05-25 MED ORDER — LIDOCAINE HCL 1 % IJ SOLN
2.0000 mL | INTRAMUSCULAR | Status: AC | PRN
  Administered 2018-05-25: 2 mL

## 2018-05-25 NOTE — Progress Notes (Signed)
Office Visit Note   Patient: Carolyn Rowland           Date of Birth: 21-Jan-1941           MRN: [id number] Visit Date: 05/25/2018              Requested by: Fanny Bien, [address] [place], [place] [address] PCP: Fanny Bien, MD   Assessment & Plan: Visit Diagnoses:  1. Acute pain of left knee   2. Unilateral primary osteoarthritis, left knee     Plan: In hopes of giving patient some relief of her left knee pain offered injection.  After patient consent intra-articular Marcaine/Depo-Medrol injection was performed.  Follow-up me in 6 weeks for recheck.  Patient may be a candidate for Visco supplementation depending on her response.  Follow-Up Instructions: Return in about 6 weeks (around 07/06/2018) for With Jeneen Rinks.   Orders:  Orders Placed This Encounter  Procedures  . Large Joint Inj  . XR KNEE 3 VIEW LEFT   No orders of the defined types were placed in this encounter.     Procedures: Large Joint Inj: L knee on 05/25/2018 3:08 PM Indications: pain Details: 25 G needle, medial approach Medications: 6 mL bupivacaine 0.25 %; 80 mg methylPREDNISolone acetate 40 MG/ML; 2 mL lidocaine 1 % Consent was given by the patient.       Clinical Data: No additional findings.   Subjective: Chief Complaint  Patient presents with  . Left Knee - Pain    HPI 77 year old black female comes in with complaints of left knee pain.  She has known history of left knee arthritis.  Knee is been progressively getting worse over the last couple of months.  Pain with ambulation.  Complains  of pain swelling and popping.  Has had right total hip replacement and this is doing well. Review of Systems No current cardiac pulmonary GI GU issues  Objective: Vital Signs: BP 109/72   Pulse 76   Ht 5\' 3"  (1.6 m)   Wt 205 lb (93 kg)   BMI 36.31 kg/m   Physical Exam  Constitutional: No distress.  HENT:  Head: Normocephalic and atraumatic.  Eyes: Pupils are equal,  round, and reactive to light. EOM are normal.  Abdominal: She exhibits no distension.  Musculoskeletal:  Gait is antalgic.  Left knee patient actually has good range of motion.  Positive crepitus.  Some swelling without large effusion.  Joint line tender.  Ligaments are stable.  Calf nontender.  Neurovascular intact.  Neurological: She is alert.  Skin: Skin is warm and dry.  Psychiatric: She has a normal mood and affect.    Ortho Exam  Specialty Comments:  No specialty comments available.  Imaging: Xr Knee 3 View Left  Result Date: 05/25/2018 X-ray left knee shows tract acromial degenerative changes.  Medial greater than lateral compartment narrowing.  Periarticular spurs.    PMFS History: Patient Active Problem List   Diagnosis Date Noted  . Arthritis of right hip 12/19/2017  . Spinal stenosis of lumbosacral region 09/22/2017  . Bilateral primary osteoarthritis of hip 09/22/2017  . CAP (community acquired pneumonia) 09/04/2014  . Leukocytosis 09/04/2014  . Hypokalemia 09/04/2014  . Breast cancer of upper-outer quadrant of right female breast ([place]) 08/27/2014  . Coronary atherosclerosis of native coronary artery 08/18/2013  . S/P PTCA (percutaneous transluminal coronary angioplasty) 08/17/2013   Past Medical History:  Diagnosis Date  . Anemia    in younger years  .  Anginal pain ([place])   . Aortic stenosis    mild to moderate AS 07/2013  . Arthritis    "hands; legs" (08/17/2013)  . CAP (community acquired pneumonia)   . Coronary artery disease    6 stents in 2015  . GERD (gastroesophageal reflux disease)   . H/O hiatal hernia   . Heart murmur   . High cholesterol   . Hypertension   . Pneumonia 1970's   "once"  . Radiation 11/28/14-01/02/15   Right breast 50 Gray  . Vertigo     Family History  Problem Relation Age of Onset  . Cancer Brother   . Cancer Brother   . Heart attack Brother     Past Surgical History:  Procedure Laterality Date  . ABDOMINAL  HYSTERECTOMY  1985  . APPENDECTOMY  1960  . BREAST LUMPECTOMY WITH NEEDLE LOCALIZATION AND AXILLARY SENTINEL LYMPH NODE BX Right 10/17/2014   Procedure: BREAST LUMPECTOMY WITH NEEDLE LOCALIZATION AND AXILLARY SENTINEL LYMPH NODE BX;  Surgeon: Jackolyn Confer, MD;  Location: [place];  Service: General;  Laterality: Right;  . CARDIAC CATHETERIZATION  08/07/2013  . COLONOSCOPY    . CORONARY ANGIOPLASTY WITH STENT PLACEMENT  08/17/2013   "6 stents"(08/17/2013)  . LEFT AND RIGHT HEART CATHETERIZATION WITH CORONARY ANGIOGRAM N/A 08/07/2013   Procedure: LEFT AND RIGHT HEART CATHETERIZATION WITH CORONARY ANGIOGRAM;  Surgeon: Laverda Page, MD;  Location[facility] CATH LAB;  Service: Cardiovascular;  Laterality: N/A;  . PERCUTANEOUS CORONARY STENT INTERVENTION (PCI-S) N/A 08/17/2013   Procedure: PERCUTANEOUS CORONARY STENT INTERVENTION (PCI-S);  Surgeon: Laverda Page, MD;  Location: [facility] CATH LAB;  Service: Cardiovascular;  Laterality: N/A;  . TOTAL HIP ARTHROPLASTY Right 12/19/2017   Procedure: RIGHT TOTAL HIP ARTHROPLASTY ANTERIOR APPROACH;  Surgeon: Marybelle Killings, MD;  Location: [place];  Service: Orthopedics;  Laterality: Right;  . TUBAL LIGATION  1980   Social History   Occupational History  . Not on file  Tobacco Use  . Smoking status: Former Smoker    Packs/day: 1.00    Years: 52.00    Pack years: 52.00    Types: Cigarettes    Last attempt to quit: 04/04/2014    Years since quitting: 4.1  . Smokeless tobacco: Never Used  Substance and Sexual Activity  . Alcohol use: No    Comment: 08/17/2013 "quit drinking at age 65; never did drink much"  . Drug use: No  . Sexual activity: Never

## 2018-05-30 NOTE — Progress Notes (Signed)
Patient Care Team: Fanny Bien, MD as PCP - General (Family Medicine)  DIAGNOSIS:    ICD-10-CM   1. Malignant neoplasm of upper-outer quadrant of right breast in female, estrogen receptor positive ([place]) C50.411    Z17.0     SUMMARY OF ONCOLOGIC HISTORY:   Breast cancer of upper-outer quadrant of right female breast ([place])   07/23/2014 Mammogram    Right breast (12 o'clock): New cluster of heterogenous calcs in right breast suspicious for malignancy. No other significant findings seen in right breast.    08/07/2014 Initial Biopsy    Right breast biopsy: Invasive ductal carcinoma with papillary features associated microcalcs. Grade 2. ER+ (99%), PR+ (97%), HER2- by FISH. Ki67 7%.     08/20/2014 Breast MRI    Right breast: 1.8 cm enhancement at 11:00. Left breast: 7 x 9 mm circumscribed oval mass at 9:00 stable from 2010-benign. No abnormal appearing lymph nodes.    10/17/2014 Surgery    Right breast lumpectomy with SLNB ([facility]): Grade 2, residual IDC with papillary features 0.28 cm. 0/1 right SLN. 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 ER+/PR+ HER-2 negative, Ki67 7%.    10/17/2014 Pathologic Stage     pT1a, pN0: Stage IA    11/28/2014 - 01/02/2015 Radiation Therapy    Adjuvant RT completed [facility]): Right breast / 50 Gray @ 2 [name]    01/2015 -  Anti-estrogen oral therapy    Anastrazole '1mg'$  daily. Planned duration of treatment: 5 years.     02/13/2015 Survivorship    Survivorship Care Plan given/reviewed with pt.      CHIEF COMPLIANT: Follow-up on anastrozole therapy  INTERVAL HISTORY: Carolyn Rowland is a 77 y.o. with above-mentioned history of right breast cancer treated with lumpectomy and radiation is currently on anastrozole. I last saw her one year ago. In the interim she had surgery to replace her right hip on 12/19/17. Her most recent bone density scan on 11/02/17 that showed a T-score of -2.3. She  presents to the clinic today alone and reports she has been doing okay. She denies any side effects of anastrozole therapy. She reports hot flashes only occur when she eats sweets or drinks iced tea. She denies stiffness in her arms and hands. She reports her recent hip replacement went well, but reported new left knee pain for which she got a cortisol injection, which did not help with the pain, and has been taking Aleve which relieved the pain. She reviewed her medication list with me.   REVIEW OF SYSTEMS:   Constitutional: Denies fevers, chills or abnormal weight loss Eyes: Denies blurriness of vision Ears, nose, mouth, throat, and face: Denies mucositis or sore throat Respiratory: Denies cough, dyspnea or wheezes Cardiovascular: Denies palpitation, chest discomfort Gastrointestinal:  Denies nausea, heartburn or change in bowel habits Skin: Denies abnormal skin rashes MSK: (+) left knee pain Lymphatics: Denies new lymphadenopathy or easy bruising Neurological:Denies numbness, tingling or new weaknesses Behavioral/Psych: Mood is stable, no new changes  Extremities: No lower extremity edema Breast: denies any pain or lumps or nodules in either breasts All other systems were reviewed with the patient and are negative.  I have reviewed the past medical history, past surgical history, social history and family history with the patient and they are unchanged from previous note.  ALLERGIES:  is allergic to penicillins.  MEDICATIONS:  Current Outpatient Medications  Medication Sig Dispense Refill  . alendronate (FOSAMAX) 70 MG tablet Take  1 tablet (70 mg total) by mouth once a week. Take with a full glass of water on an empty stomach. 12 tablet 3  . amLODipine (NORVASC) 2.5 MG tablet Take 2.5 mg by mouth daily.    Marland Kitchen anastrozole (ARIMIDEX) 1 MG tablet Take 1 tablet (1 mg total) by mouth daily. 90 tablet 3  . aspirin EC 325 MG EC tablet Take 1 tablet (325 mg total) by mouth daily with breakfast.  30 tablet 0  . Cholecalciferol (VITAMIN D3) 400 units CAPS Take 400 Units by mouth daily.    . CRESTOR 20 MG tablet Take 20 mg by mouth daily.     . isosorbide mononitrate (IMDUR) 60 MG 24 hr tablet Take 1 tablet (60 mg total) by mouth daily. 30 tablet 1  . metoprolol succinate (TOPROL-XL) 50 MG 24 hr tablet Take 50 mg by mouth daily. Take with or immediately following a meal.    . Multiple Minerals-Vitamins (CALCIUM & VIT D3 BONE HEALTH PO) Take 1 tablet by mouth 2 (two) times a week.    . nitroGLYCERIN (NITROSTAT) 0.4 MG SL tablet Place 1 tablet (0.4 mg total) under the tongue every 5 (five) minutes as needed for chest pain. 30 tablet 1  . omeprazole (PRILOSEC) 40 MG capsule Take 40 mg by mouth daily.  1  . spironolactone (ALDACTONE) 25 MG tablet Take 25 mg by mouth daily.     No current facility-administered medications for this visit.     PHYSICAL EXAMINATION: ECOG PERFORMANCE STATUS: 0 - Asymptomatic  Vitals:   06/06/18 1028  BP: (!) 114/51  Pulse: 68  Resp: 17  Temp: 98.1 F (36.7 C)  SpO2: 100%   Filed Weights   06/06/18 1028  Weight: 209 lb 11.2 oz (95.1 kg)    GENERAL:alert, no distress and comfortable SKIN: skin color, texture, turgor are normal, no rashes or significant lesions EYES: normal, Conjunctiva are pink and non-injected, sclera clear OROPHARYNX:no exudate, no erythema and lips, buccal mucosa, and tongue normal  NECK: supple, thyroid normal size, non-tender, without nodularity LYMPH:  no palpable lymphadenopathy in the cervical, axillary or inguinal LUNGS: clear to auscultation and percussion with normal breathing effort HEART: regular rate & rhythm and no murmurs and no lower extremity edema ABDOMEN:abdomen soft, non-tender and normal bowel sounds MUSCULOSKELETAL:no cyanosis of digits and no clubbing  NEURO: alert & oriented x 3 with fluent speech, no focal motor/sensory deficits EXTREMITIES: No lower extremity edema BREAST: No palpable masses or nodules  in either right or left breasts.  Right breast scar tissue from prior surgeries palpated.  No palpable axillary supraclavicular or infraclavicular adenopathy no breast tenderness or nipple discharge. (exam performed in the presence of a chaperone)  LABORATORY DATA:  I have reviewed the data as listed CMP Latest Ref Rng & Units 12/20/2017 12/09/2017 12/29/2015  Glucose 65 - 99 mg/dL 138(H) 104(H) 91  BUN 6 - 20 mg/dL '16 15 12  '$ Creatinine 0.44 - 1.00 mg/dL 0.80 0.75 0.80  Sodium 135 - 145 mmol/L 139 141 140  Potassium 3.5 - 5.1 mmol/L 4.2 3.9 4.5  Chloride 101 - 111 mmol/L 106 107 106  CO2 22 - 32 mmol/L '26 25 26  '$ Calcium 8.9 - 10.3 mg/dL 9.0 9.3 9.4  Total Protein 6.5 - 8.1 g/dL - 6.9 7.0  Total Bilirubin 0.3 - 1.2 mg/dL - 0.5 0.8  Alkaline Phos 38 - 126 U/L - 46 54  AST 15 - 41 U/L - 16 22  ALT 14 - 54 U/L -  11(L) 14    Lab Results  Component Value Date   WBC 9.8 12/22/2017   HGB 9.7 (L) 12/22/2017   HCT 28.0 (L) 12/22/2017   MCV 78.7 12/22/2017   PLT 105 (L) 12/22/2017   NEUTROABS 3.6 11/07/2014    ASSESSMENT & PLAN:  Breast cancer of upper-outer quadrant of right female breast  Right breast lumpectomy 10/17/2014: 0.28 cm focus of residual IDC grade 2 with papillary features, 0/1 sentinel node, background of sclerosing lymphocytic lobulitis, fibrocystic changes, E 99%, PR 97%, HER-2 negative, Ki-67 7%, T1a N0 M0 stage IA S/P XRT 01/02/15, Started anastrozole August 2016  Anastrozole Toxicities:  Denies any side effects to anastrozole.  Breast Cancer Surveillance: 1. Breast exam  06/06/2018: Benign scar tissue from prior surgery 2. Mammogram  March 2019 at [facility]. 3.  Bone density 11/02/2017: T score -2.3: Osteopenia  I recommended bisphosphonate therapy with Fosamax I sent a prescription to her pharmacy today Discussed the precautions for taking these medications especially to drink with a tall glass of water and not lie down after taking it for 30 minutes.  Right hip  arthroplasty July 2019 RTC in1 year    No orders of the defined types were placed in this encounter.  The patient has a good understanding of the overall plan. she agrees with it. she will call with any problems that may develop before the next visit here.  Nicholas Lose, MD 06/06/2018   I, Cloyde Reams Dorshimer, am acting as scribe for Nicholas Lose, MD.  I have reviewed the above documentation for accuracy and completeness, and I agree with the above.

## 2018-06-06 ENCOUNTER — Inpatient Hospital Stay: Payer: Medicare HMO | Attending: Hematology and Oncology | Admitting: Hematology and Oncology

## 2018-06-06 DIAGNOSIS — M8580 Other specified disorders of bone density and structure, unspecified site: Secondary | ICD-10-CM | POA: Diagnosis not present

## 2018-06-06 DIAGNOSIS — Z79811 Long term (current) use of aromatase inhibitors: Secondary | ICD-10-CM | POA: Diagnosis not present

## 2018-06-06 DIAGNOSIS — Z170 Estrogen receptor positive status [ER+]: Secondary | ICD-10-CM | POA: Diagnosis not present

## 2018-06-06 DIAGNOSIS — C50411 Malignant neoplasm of upper-outer quadrant of right female breast: Secondary | ICD-10-CM | POA: Diagnosis not present

## 2018-06-06 MED ORDER — ALENDRONATE SODIUM 70 MG PO TABS: 70.0000 mg | ORAL_TABLET | ORAL | 3 refills | Status: DC

## 2018-06-06 MED ORDER — ANASTROZOLE 1 MG PO TABS: 1.0000 mg | ORAL_TABLET | Freq: Every day | ORAL | 3 refills | Status: DC

## 2018-06-06 NOTE — Assessment & Plan Note (Addendum)
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  Breast Cancer Surveillance: 1. Breast exam  06/06/2018: Benign  2. Mammogram  March 2019 at [facility]. 3.  Bone density 11/02/2017: T score -2.3: Osteopenia  I recommended bisphosphonate therapy with Fosamax or Boniva.  Discussed the precautions for taking these medications.  Right hip arthroplasty July 2019 RTC in1 year

## 2018-06-07 ENCOUNTER — Telehealth: Payer: Self-pay | Admitting: Hematology and Oncology

## 2018-06-07 NOTE — Telephone Encounter (Signed)
Made appt per 12/3 los.  Mailed calendar.

## 2018-07-07 DIAGNOSIS — I10 Essential (primary) hypertension: Secondary | ICD-10-CM | POA: Diagnosis not present

## 2018-07-07 DIAGNOSIS — M1990 Unspecified osteoarthritis, unspecified site: Secondary | ICD-10-CM | POA: Diagnosis not present

## 2018-07-10 ENCOUNTER — Encounter: Payer: Self-pay | Admitting: Internal Medicine

## 2018-07-12 ENCOUNTER — Ambulatory Visit (INDEPENDENT_AMBULATORY_CARE_PROVIDER_SITE_OTHER): Payer: Medicare HMO | Admitting: Surgery

## 2018-07-19 ENCOUNTER — Telehealth: Payer: Self-pay

## 2018-07-19 DIAGNOSIS — Z6836 Body mass index (BMI) 36.0-36.9, adult: Secondary | ICD-10-CM | POA: Diagnosis not present

## 2018-07-19 DIAGNOSIS — J40 Bronchitis, not specified as acute or chronic: Secondary | ICD-10-CM | POA: Diagnosis not present

## 2018-07-19 DIAGNOSIS — J069 Acute upper respiratory infection, unspecified: Secondary | ICD-10-CM | POA: Diagnosis not present

## 2018-07-19 NOTE — Telephone Encounter (Signed)
Meg,  This pt is cleared for anesthetic care at [facility].  Thanks,  Osvaldo Angst

## 2018-07-19 NOTE — Telephone Encounter (Signed)
Hi John,  Would you please look over this patient's echo 08/01/13?   Let me know what you think.  Thank you, Delsa Bern RN

## 2018-07-27 ENCOUNTER — Ambulatory Visit (AMBULATORY_SURGERY_CENTER): Payer: Medicare HMO

## 2018-07-27 ENCOUNTER — Encounter: Payer: Self-pay | Admitting: Internal Medicine

## 2018-07-27 VITALS — Ht 66.0 in | Wt 211.6 lb

## 2018-07-27 DIAGNOSIS — Z1211 Encounter for screening for malignant neoplasm of colon: Secondary | ICD-10-CM

## 2018-07-27 NOTE — Progress Notes (Signed)
Denies allergies to eggs or soy products. Denies complication of anesthesia or sedation. Denies use of weight loss medication. Denies use of O2.   Emmi instructions declined.   Pre-visit took place over an hour. Extra time was spent going over instructions with the patient. Patient verbalizes instructions.

## 2018-08-07 DIAGNOSIS — E669 Obesity, unspecified: Secondary | ICD-10-CM | POA: Diagnosis not present

## 2018-08-07 DIAGNOSIS — I25118 Atherosclerotic heart disease of native coronary artery with other forms of angina pectoris: Secondary | ICD-10-CM | POA: Diagnosis not present

## 2018-08-07 DIAGNOSIS — R69 Illness, unspecified: Secondary | ICD-10-CM | POA: Diagnosis not present

## 2018-08-07 DIAGNOSIS — I350 Nonrheumatic aortic (valve) stenosis: Secondary | ICD-10-CM | POA: Diagnosis not present

## 2018-08-07 DIAGNOSIS — M1990 Unspecified osteoarthritis, unspecified site: Secondary | ICD-10-CM | POA: Diagnosis not present

## 2018-08-07 DIAGNOSIS — E785 Hyperlipidemia, unspecified: Secondary | ICD-10-CM | POA: Diagnosis not present

## 2018-08-07 DIAGNOSIS — I10 Essential (primary) hypertension: Secondary | ICD-10-CM | POA: Diagnosis not present

## 2018-08-10 ENCOUNTER — Ambulatory Visit (AMBULATORY_SURGERY_CENTER): Payer: Medicare HMO | Admitting: Internal Medicine

## 2018-08-10 ENCOUNTER — Encounter: Payer: Self-pay | Admitting: Internal Medicine

## 2018-08-10 VITALS — BP 138/86 | HR 70 | Temp 96.8°F | Resp 17 | Ht 63.0 in | Wt 209.0 lb

## 2018-08-10 DIAGNOSIS — Z1211 Encounter for screening for malignant neoplasm of colon: Secondary | ICD-10-CM

## 2018-08-10 DIAGNOSIS — D122 Benign neoplasm of ascending colon: Secondary | ICD-10-CM

## 2018-08-10 DIAGNOSIS — I2510 Atherosclerotic heart disease of native coronary artery without angina pectoris: Secondary | ICD-10-CM | POA: Diagnosis not present

## 2018-08-10 MED ORDER — SODIUM CHLORIDE 0.9 % IV SOLN: 500.0000 mL | Freq: Once | INTRAVENOUS | Status: DC

## 2018-08-10 NOTE — Progress Notes (Signed)
Pt's states no medical or surgical changes since previsit or office visit. 

## 2018-08-10 NOTE — Patient Instructions (Addendum)
   I found and removed 2 tiny polyps.  I do not anticipate recommending another routine repeat colonoscopy but will review pathology and let you know.  I appreciate the opportunity to care for you. Gatha Mayer, MD, [facility]   Handouts Provided:  Polyps  YOU HAD AN ENDOSCOPIC PROCEDURE TODAY AT [place]:   Refer to the procedure report that was given to you for any specific questions about what was found during the examination.  If the procedure report does not answer your questions, please call your gastroenterologist to clarify.  If you requested that your care partner not be given the details of your procedure findings, then the procedure report has been included in a sealed envelope for you to review at your convenience later.  YOU SHOULD EXPECT: Some feelings of bloating in the abdomen. Passage of more gas than usual.  Walking can help get rid of the air that was put into your GI tract during the procedure and reduce the bloating. If you had a lower endoscopy (such as a colonoscopy or flexible sigmoidoscopy) you may notice spotting of blood in your stool or on the toilet paper. If you underwent a bowel prep for your procedure, you may not have a normal bowel movement for a few days.  Please Note:  You might notice some irritation and congestion in your nose or some drainage.  This is from the oxygen used during your procedure.  There is no need for concern and it should clear up in a day or so.  SYMPTOMS TO REPORT IMMEDIATELY:   Following lower endoscopy (colonoscopy or flexible sigmoidoscopy):  Excessive amounts of blood in the stool  Significant tenderness or worsening of abdominal pains  Swelling of the abdomen that is new, acute  Fever of 100F or higher  For urgent or emergent issues, a gastroenterologist can be reached at any hour by calling [phone].   DIET:  We do recommend a small meal at first, but then you may proceed to your regular diet.   Drink plenty of fluids but you should avoid alcoholic beverages for 24 hours.  ACTIVITY:  You should plan to take it easy for the rest of today and you should NOT DRIVE or use heavy machinery until tomorrow (because of the sedation medicines used during the test).    FOLLOW UP: Our staff will call the number listed on your records the next business day following your procedure to check on you and address any questions or concerns that you may have regarding the information given to you following your procedure. If we do not reach you, we will leave a message.  However, if you are feeling well and you are not experiencing any problems, there is no need to return our call.  We will assume that you have returned to your regular daily activities without incident.  If any biopsies were taken you will be contacted by phone or by letter within the next 1-3 weeks.  Please call us at [phone] if you have not heard about the biopsies in 3 weeks.    SIGNATURES/CONFIDENTIALITY: You and/or your care partner have signed paperwork which will be entered into your electronic medical record.  These signatures attest to the fact that that the information above on your After Visit Summary has been reviewed and is understood.  Full responsibility of the confidentiality of this discharge information lies with you and/or your care-partner.

## 2018-08-10 NOTE — Progress Notes (Signed)
Report given to PACU, vss 

## 2018-08-10 NOTE — Op Note (Signed)
[facility] Patient Name: Carolyn Rowland Procedure Date: 08/10/2018 11:26 AM MRN: [id number] Endoscopist: Gatha Mayer , MD Age: 78 Referring MD:  Date of Birth: 1940/08/06 Gender: Female Account #: [id number] Procedure:                Colonoscopy Indications:              Screening for colorectal malignant neoplasm Medicines:                Propofol per Anesthesia, Monitored Anesthesia Care Procedure:                Pre-Anesthesia Assessment:                           - Prior to the procedure, a History and Physical                            was performed, and patient medications and                            allergies were reviewed. The patient's tolerance of                            previous anesthesia was also reviewed. The risks                            and benefits of the procedure and the sedation                            options and risks were discussed with the patient.                            All questions were answered, and informed consent                            was obtained. Prior Anticoagulants: The patient has                            taken no previous anticoagulant or antiplatelet                            agents. ASA Grade Assessment: III - A patient with                            severe systemic disease. After reviewing the risks                            and benefits, the patient was deemed in                            satisfactory condition to undergo the procedure.                           After obtaining informed consent, the colonoscope  was passed under direct vision. Throughout the                            procedure, the patient's blood pressure, pulse, and                            oxygen saturations were monitored continuously. The                            Colonoscope was introduced through the anus and                            advanced to the the cecum, identified by   appendiceal orifice and ileocecal valve. The                            colonoscopy was performed without difficulty. The                            patient tolerated the procedure well. The quality                            of the bowel preparation was good. The ileocecal                            valve, appendiceal orifice, and rectum were                            photographed. Scope In: 11:40:59 AM Scope Out: 11:55:04 AM Scope Withdrawal Time: 0 hours 11 minutes 32 seconds  Total Procedure Duration: 0 hours 14 minutes 5 seconds  Findings:                 The perianal and digital rectal examinations were                            normal.                           Two sessile polyps were found in the ascending                            colon. The polyps were diminutive in size. These                            polyps were removed with a cold snare. Resection                            and retrieval were complete. Verification of                            patient identification for the specimen was done.                            Estimated blood loss was minimal.  Multiple diverticula were found in the sigmoid                            colon.                           The exam was otherwise without abnormality on                            direct and retroflexion views. Complications:            No immediate complications. Estimated Blood Loss:     Estimated blood loss was minimal. Impression:               - Two diminutive polyps in the ascending colon,                            removed with a cold snare. Resected and retrieved.                           - Diverticulosis in the sigmoid colon.                           - The examination was otherwise normal on direct                            and retroflexion views. Recommendation:           - Patient has a contact number available for                            emergencies. The signs and symptoms of  potential                            delayed complications were discussed with the                            patient. Return to normal activities tomorrow.                            Written discharge instructions were provided to the                            patient.                           - Resume previous diet.                           - Continue present medications.                           - No routinerepeat colonoscopy likely - await                            pathology review but she is 77 and only 2  diminutive polyps removed.Gatha Mayer, MD 08/10/2018 12:08:46 PM This report has been signed electronically.

## 2018-08-10 NOTE — Progress Notes (Signed)
Called to room to assist during endoscopic procedure.  Patient ID and intended procedure confirmed with present staff. Received instructions for my participation in the procedure from the performing physician.  

## 2018-08-11 ENCOUNTER — Telehealth: Payer: Self-pay | Admitting: *Deleted

## 2018-08-11 NOTE — Telephone Encounter (Signed)
First follow up call attempt.  No answer or option to leave a message. Will attempt again later today.

## 2018-08-11 NOTE — Telephone Encounter (Signed)
Second follow up call attempt.  No answer or voicemail option 

## 2018-08-15 ENCOUNTER — Encounter: Payer: Self-pay | Admitting: Internal Medicine

## 2018-08-15 DIAGNOSIS — Z86010 Personal history of colonic polyps: Secondary | ICD-10-CM

## 2018-08-15 DIAGNOSIS — Z860101 Personal history of adenomatous and serrated colon polyps: Secondary | ICD-10-CM

## 2018-08-15 HISTORY — DX: Personal history of colonic polyps: Z86.010

## 2018-08-15 HISTORY — DX: Personal history of adenomatous and serrated colon polyps: Z86.0101

## 2018-08-29 ENCOUNTER — Telehealth (INDEPENDENT_AMBULATORY_CARE_PROVIDER_SITE_OTHER): Payer: Self-pay | Admitting: Orthopaedic Surgery

## 2018-08-29 MED ORDER — TRAMADOL HCL 50 MG PO TABS: 50.0000 mg | ORAL_TABLET | Freq: Two times a day (BID) | ORAL | 0 refills | Status: DC | PRN

## 2018-08-29 NOTE — Telephone Encounter (Signed)
I talked to pt and scheduled her for an apt next week on the 3rd at 1:30.  Pt asked in the meantime can she have some pain medication because she is is so much pain.

## 2018-08-29 NOTE — Telephone Encounter (Signed)
Called to pharmacy. Patient advised.  

## 2018-08-29 NOTE — Telephone Encounter (Signed)
OK ultram # 20  One po bid prn pain ucall thanks

## 2018-08-29 NOTE — Telephone Encounter (Signed)
Please advise. Patient appt is for left knee pain. She saw Jeneen Rinks this past November for left knee pain and got an injection.

## 2018-09-04 DIAGNOSIS — Z6836 Body mass index (BMI) 36.0-36.9, adult: Secondary | ICD-10-CM | POA: Diagnosis not present

## 2018-09-04 DIAGNOSIS — J40 Bronchitis, not specified as acute or chronic: Secondary | ICD-10-CM | POA: Diagnosis not present

## 2018-09-05 ENCOUNTER — Ambulatory Visit (INDEPENDENT_AMBULATORY_CARE_PROVIDER_SITE_OTHER): Payer: Medicare HMO | Admitting: Orthopaedic Surgery

## 2018-09-28 DIAGNOSIS — Z719 Counseling, unspecified: Secondary | ICD-10-CM | POA: Diagnosis not present

## 2018-09-28 DIAGNOSIS — R05 Cough: Secondary | ICD-10-CM | POA: Diagnosis not present

## 2018-09-28 DIAGNOSIS — J309 Allergic rhinitis, unspecified: Secondary | ICD-10-CM | POA: Diagnosis not present

## 2018-11-06 DIAGNOSIS — M1990 Unspecified osteoarthritis, unspecified site: Secondary | ICD-10-CM | POA: Diagnosis not present

## 2018-11-06 DIAGNOSIS — R69 Illness, unspecified: Secondary | ICD-10-CM | POA: Diagnosis not present

## 2018-11-06 DIAGNOSIS — I10 Essential (primary) hypertension: Secondary | ICD-10-CM | POA: Diagnosis not present

## 2018-11-06 DIAGNOSIS — I25118 Atherosclerotic heart disease of native coronary artery with other forms of angina pectoris: Secondary | ICD-10-CM | POA: Diagnosis not present

## 2018-11-06 DIAGNOSIS — I350 Nonrheumatic aortic (valve) stenosis: Secondary | ICD-10-CM | POA: Diagnosis not present

## 2018-11-06 DIAGNOSIS — E785 Hyperlipidemia, unspecified: Secondary | ICD-10-CM | POA: Diagnosis not present

## 2018-12-14 DIAGNOSIS — J309 Allergic rhinitis, unspecified: Secondary | ICD-10-CM | POA: Diagnosis not present

## 2018-12-14 DIAGNOSIS — Z719 Counseling, unspecified: Secondary | ICD-10-CM | POA: Diagnosis not present

## 2018-12-14 DIAGNOSIS — J029 Acute pharyngitis, unspecified: Secondary | ICD-10-CM | POA: Diagnosis not present

## 2018-12-22 DIAGNOSIS — R05 Cough: Secondary | ICD-10-CM | POA: Diagnosis not present

## 2018-12-22 DIAGNOSIS — K219 Gastro-esophageal reflux disease without esophagitis: Secondary | ICD-10-CM | POA: Diagnosis not present

## 2018-12-22 DIAGNOSIS — J309 Allergic rhinitis, unspecified: Secondary | ICD-10-CM | POA: Diagnosis not present

## 2018-12-22 DIAGNOSIS — I10 Essential (primary) hypertension: Secondary | ICD-10-CM | POA: Diagnosis not present

## 2018-12-28 DIAGNOSIS — K0889 Other specified disorders of teeth and supporting structures: Secondary | ICD-10-CM | POA: Diagnosis not present

## 2019-01-17 DIAGNOSIS — R69 Illness, unspecified: Secondary | ICD-10-CM | POA: Diagnosis not present

## 2019-01-24 DIAGNOSIS — M1612 Unilateral primary osteoarthritis, left hip: Secondary | ICD-10-CM | POA: Diagnosis not present

## 2019-01-24 DIAGNOSIS — M25552 Pain in left hip: Secondary | ICD-10-CM | POA: Diagnosis not present

## 2019-01-24 DIAGNOSIS — M1712 Unilateral primary osteoarthritis, left knee: Secondary | ICD-10-CM | POA: Diagnosis not present

## 2019-02-07 DIAGNOSIS — M1990 Unspecified osteoarthritis, unspecified site: Secondary | ICD-10-CM | POA: Diagnosis not present

## 2019-02-07 DIAGNOSIS — E785 Hyperlipidemia, unspecified: Secondary | ICD-10-CM | POA: Diagnosis not present

## 2019-02-07 DIAGNOSIS — R69 Illness, unspecified: Secondary | ICD-10-CM | POA: Diagnosis not present

## 2019-02-07 DIAGNOSIS — I10 Essential (primary) hypertension: Secondary | ICD-10-CM | POA: Diagnosis not present

## 2019-02-07 DIAGNOSIS — I25118 Atherosclerotic heart disease of native coronary artery with other forms of angina pectoris: Secondary | ICD-10-CM | POA: Diagnosis not present

## 2019-02-07 DIAGNOSIS — I350 Nonrheumatic aortic (valve) stenosis: Secondary | ICD-10-CM | POA: Diagnosis not present

## 2019-03-02 DIAGNOSIS — H524 Presbyopia: Secondary | ICD-10-CM | POA: Diagnosis not present

## 2019-03-08 ENCOUNTER — Ambulatory Visit: Payer: Medicare HMO | Admitting: Surgery

## 2019-03-26 ENCOUNTER — Telehealth: Payer: Self-pay | Admitting: Surgery

## 2019-03-26 MED ORDER — TRAMADOL HCL 50 MG PO TABS: 50.0000 mg | ORAL_TABLET | Freq: Two times a day (BID) | ORAL | 0 refills | Status: DC | PRN

## 2019-03-26 NOTE — Telephone Encounter (Signed)
Please advise 

## 2019-03-26 NOTE — Telephone Encounter (Signed)
Called to pharmacy. I tried to call patient to advise, but no answer.  Patient already has scheduled appt with Jeneen Rinks for 04/05/2019.

## 2019-03-26 NOTE — Telephone Encounter (Signed)
Send in ultram 50mg   One po bid prn pain. # 40 .   Have her come back in for appt with Jeneen Rinks and get new xrays of left hip. She likely needs THA left in November  Depuy , spinal anesthesia,  obs status. Hanna table.  Thanks

## 2019-03-26 NOTE — Telephone Encounter (Signed)
Patient called left voicemail message needing something for pain. Patient said she can not sleep with her left leg hurting her. The number to contact patient is [phone]

## 2019-03-29 DIAGNOSIS — Z853 Personal history of malignant neoplasm of breast: Secondary | ICD-10-CM | POA: Diagnosis not present

## 2019-03-29 DIAGNOSIS — R921 Mammographic calcification found on diagnostic imaging of breast: Secondary | ICD-10-CM | POA: Diagnosis not present

## 2019-04-05 ENCOUNTER — Ambulatory Visit: Payer: Medicare HMO | Admitting: Surgery

## 2019-04-11 ENCOUNTER — Ambulatory Visit: Payer: Self-pay

## 2019-04-11 ENCOUNTER — Ambulatory Visit (INDEPENDENT_AMBULATORY_CARE_PROVIDER_SITE_OTHER): Payer: Medicare HMO | Admitting: Surgery

## 2019-04-11 DIAGNOSIS — M4807 Spinal stenosis, lumbosacral region: Secondary | ICD-10-CM | POA: Diagnosis not present

## 2019-04-11 DIAGNOSIS — M25552 Pain in left hip: Secondary | ICD-10-CM

## 2019-04-11 DIAGNOSIS — M1612 Unilateral primary osteoarthritis, left hip: Secondary | ICD-10-CM | POA: Diagnosis not present

## 2019-04-11 NOTE — Progress Notes (Signed)
Office Visit Note   Patient: Carolyn Rowland           Date of Birth: 01/12/1941           MRN: [id number] Visit Date: 04/11/2019              Requested by: Fanny Bien, [address] [place],  [place] [address] PCP: Fanny Bien, MD   Assessment & Plan: Visit Diagnoses:  1. Spinal stenosis of lumbosacral region   2. Pain in left hip   3. Arthritis of left hip     Plan: Patient complains of worsening left hip pain secondary to end-stage DJD left hip.  Degenerative changes have progressively gotten worse since her previous x-ray last year.  She would like to see about having left total hip replacement sometime in the near future.  Dr. Lorin Mercy is out of the office for a few weeks.  I did speak with his partner Dr. Jean Rosenthal about doing the procedure.  Patient states that she is okay with Dr. Ninfa Linden doing the surgery.  Dr. Ninfa Linden did review x-rays.  Preop paperwork filled out.  He also did meet with patient today.  Patient states that her husband will possibly be admitted to the hospital over the next few weeks so hopefully she can have this done during this time.  Follow-Up Instructions: Return for Follow-up with Dr. Ninfa Linden as scheduled.   Orders:  Orders Placed This Encounter  Procedures  . XR HIP UNILAT W OR W/O PELVIS 2-3 VIEWS LEFT  . XR Lumbar Spine 2-3 Views   No orders of the defined types were placed in this encounter.     Procedures: No procedures performed   Clinical Data: No additional findings.   Subjective: Chief Complaint  Patient presents with  . Left Hip - Pain  . Left Knee - Pain    HPI 78 year old black female history of end-stage DJD left hip comes in with complaints of left leg pain.  Patient seen last year.  Dr. Lorin Mercy as previously discussed her needing left total hip replacement.  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  Also has history of left knee DJD.  States that her  left hip and groin pain has been progressively getting worse.  This is causing a significant negative activity and her quality of life.  States that her husband is currently admitted to the hospital and will likely be there for a few more weeks.  She is ready to schedule left total hip replacement and is hoping to get this done sometime in the near future.  Patient states that she knows that Dr. Lorin Mercy is out of the office over the next few weeks but is willing to have procedure with another surgeon if that is an option. Review of Systems No current cardiac pulmonary GI GU issues  Objective: Vital Signs: There were no vitals taken for this visit.  Physical Exam HENT:     Head: Normocephalic.  Eyes:     Extraocular Movements: Extraocular movements intact.     Pupils: Pupils are equal, round, and reactive to light.  Musculoskeletal:     Comments: Gait is antalgic.  Left hip range of motion about 5 to 10 degrees internal/external with consider amount of groin pain.  Neurologically intact.  Neurological:     General: No focal deficit present.     Mental Status: She is alert and oriented to person, place, and time.  Psychiatric:        Mood and Affect: Mood normal.        Behavior: Behavior normal.     Ortho Exam  Specialty Comments:  No specialty comments available.  Imaging: No results found.   PMFS History: Patient Active Problem List   Diagnosis Date Noted  . Arthritis of right hip 12/19/2017  . Spinal stenosis of lumbosacral region 09/22/2017  . Bilateral primary osteoarthritis of hip 09/22/2017  . CAP (community acquired pneumonia) 09/04/2014  . Breast cancer of upper-outer quadrant of right female breast ([place]) 08/27/2014  . Coronary atherosclerosis of native coronary artery 08/18/2013  . S/P PTCA (percutaneous transluminal coronary angioplasty) 08/17/2013   Past Medical History:  Diagnosis Date  . Anemia    in younger years  . Anginal pain ([place])   . Aortic stenosis     mild to moderate AS 07/2013  . Arthritis    "hands; legs" (08/17/2013)  . CAP (community acquired pneumonia)   . Coronary artery disease    6 stents in 2015  . GERD (gastroesophageal reflux disease)   . H/O hiatal hernia   . Heart murmur   . High cholesterol   . Hx of adenomatous colonic polyps 08/15/2018  . Hypertension   . Pneumonia 1970's   "once"  . Radiation 11/28/14-01/02/15   Right breast 50 Gray  . Vertigo     Family History  Problem Relation Age of Onset  . Cancer Brother   . Colon cancer Brother   . Cancer Brother   . Heart attack Brother   . Esophageal cancer Neg Hx   . Stomach cancer Neg Hx   . Rectal cancer Neg Hx     Past Surgical History:  Procedure Laterality Date  . ABDOMINAL HYSTERECTOMY  1985  . APPENDECTOMY  1960  . BREAST LUMPECTOMY WITH NEEDLE LOCALIZATION AND AXILLARY SENTINEL LYMPH NODE BX Right 10/17/2014   Procedure: BREAST LUMPECTOMY WITH NEEDLE LOCALIZATION AND AXILLARY SENTINEL LYMPH NODE BX;  Surgeon: Jackolyn Confer, MD;  Location: [facility];  Service: General;  Laterality: Right;  . CARDIAC CATHETERIZATION  08/07/2013  . COLONOSCOPY    . CORONARY ANGIOPLASTY WITH STENT PLACEMENT  08/17/2013   "6 stents"(08/17/2013)  . LEFT AND RIGHT HEART CATHETERIZATION WITH CORONARY ANGIOGRAM N/A 08/07/2013   Procedure: LEFT AND RIGHT HEART CATHETERIZATION WITH CORONARY ANGIOGRAM;  Surgeon: Laverda Page, MD;  Location[facility] C[facility] LAB;  Service: Cardiovascular;  Laterality: N/A;  . PERCUTANEOUS CORONARY STENT INTERVENTION (PCI-S) N/A 08/17/2013   Procedure: PERCUTANEOUS CORONARY STENT INTERVENTION (PCI-S);  Surgeon: Laverda Page, MD;  Location[facility] C[facility] LAB;  Service: Cardiovascular;  Laterality: N/A;  . TOTAL HIP ARTHROPLASTY Right 12/19/2017   Procedure: RIGHT TOTAL HIP ARTHROPLASTY ANTERIOR APPROACH;  Surgeon: Marybelle Killings, MD;  Location: [facility];  Service: Orthopedics;  Laterality: Right;  . TUBAL LIGATION  1980   Social History   Occupational History  . Not  on file  Tobacco Use  . Smoking status: Former Smoker    Packs/day: 1.00    Years: 52.00    Pack years: 52.00    Types: Cigarettes    Quit date: 04/04/2014    Years since quitting: 5.0  . Smokeless tobacco: Never Used  Substance and Sexual Activity  . Alcohol use: No    Comment: 08/17/2013 "quit drinking at age 54; never did drink much"  . Drug use: No  . Sexual activity: Never

## 2019-04-23 ENCOUNTER — Telehealth: Payer: Self-pay | Admitting: Orthopaedic Surgery

## 2019-04-23 NOTE — Telephone Encounter (Signed)
noted 

## 2019-04-23 NOTE — Telephone Encounter (Signed)
Called patient to discuss surgery date for left total hip arthroplasty with Dr. Lorin Mercy.  Patient states she is unable to make any decision at the current time because her husband has been in the hospital recently.  She states she will call me when she is ready to schedule.

## 2019-05-02 ENCOUNTER — Other Ambulatory Visit: Payer: Self-pay | Admitting: Radiology

## 2019-05-02 DIAGNOSIS — R921 Mammographic calcification found on diagnostic imaging of breast: Secondary | ICD-10-CM | POA: Diagnosis not present

## 2019-05-02 DIAGNOSIS — N6489 Other specified disorders of breast: Secondary | ICD-10-CM | POA: Diagnosis not present

## 2019-05-15 ENCOUNTER — Other Ambulatory Visit: Payer: Self-pay | Admitting: Hematology and Oncology

## 2019-05-17 ENCOUNTER — Telehealth: Payer: Self-pay | Admitting: Hematology and Oncology

## 2019-05-17 NOTE — Telephone Encounter (Signed)
VG PAL 12/4 moved f/u to 12/8. Confirmed with patient.

## 2019-05-25 IMAGING — CR DG CHEST 2V
2 series · 2 of 2 positions shown · non-contrast
Comparison: 08/19/2016

CLINICAL DATA: Preoperative evaluation for RIGHT hip surgery

EXAM:
CHEST - 2 VIEW

[w chest pa]
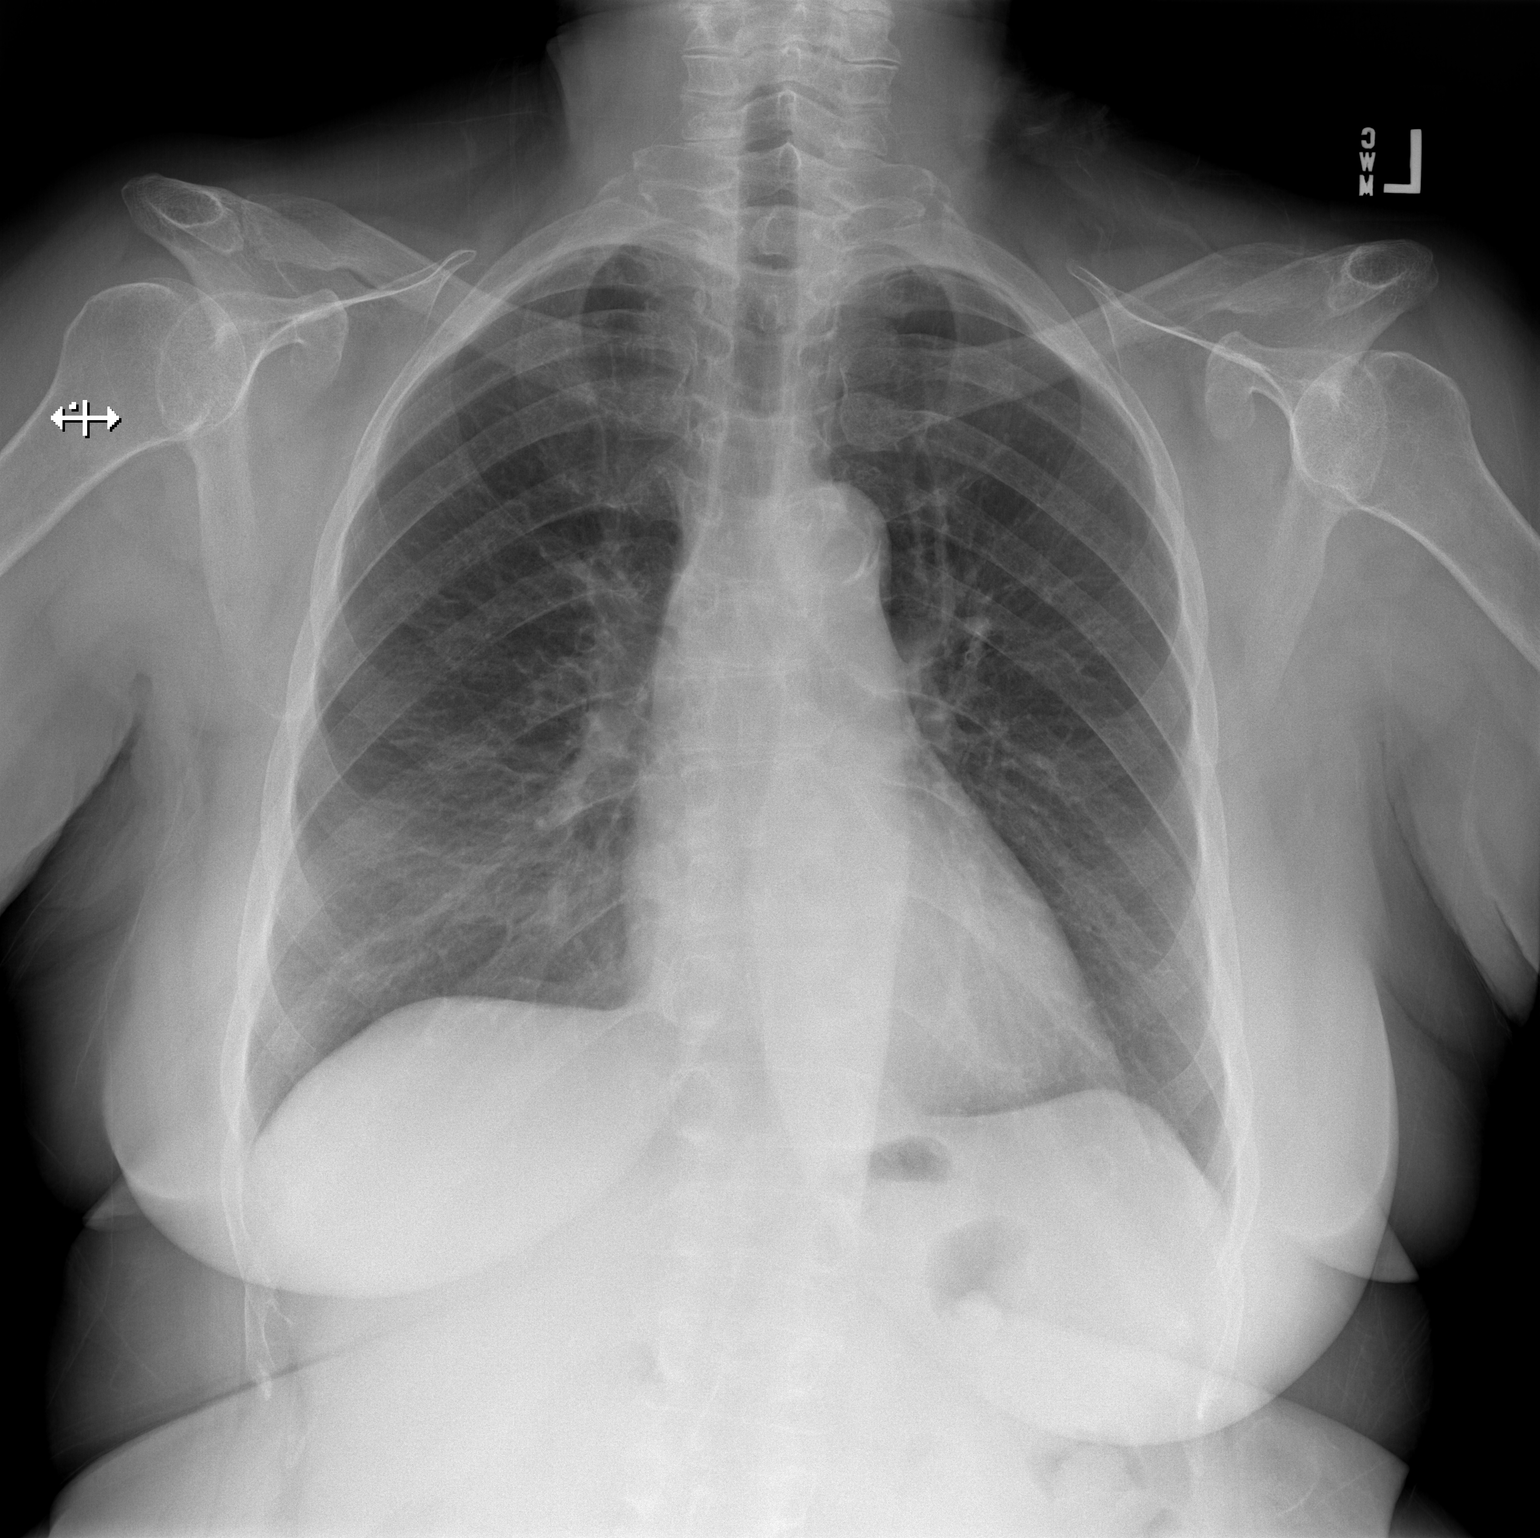

[w chest lat]
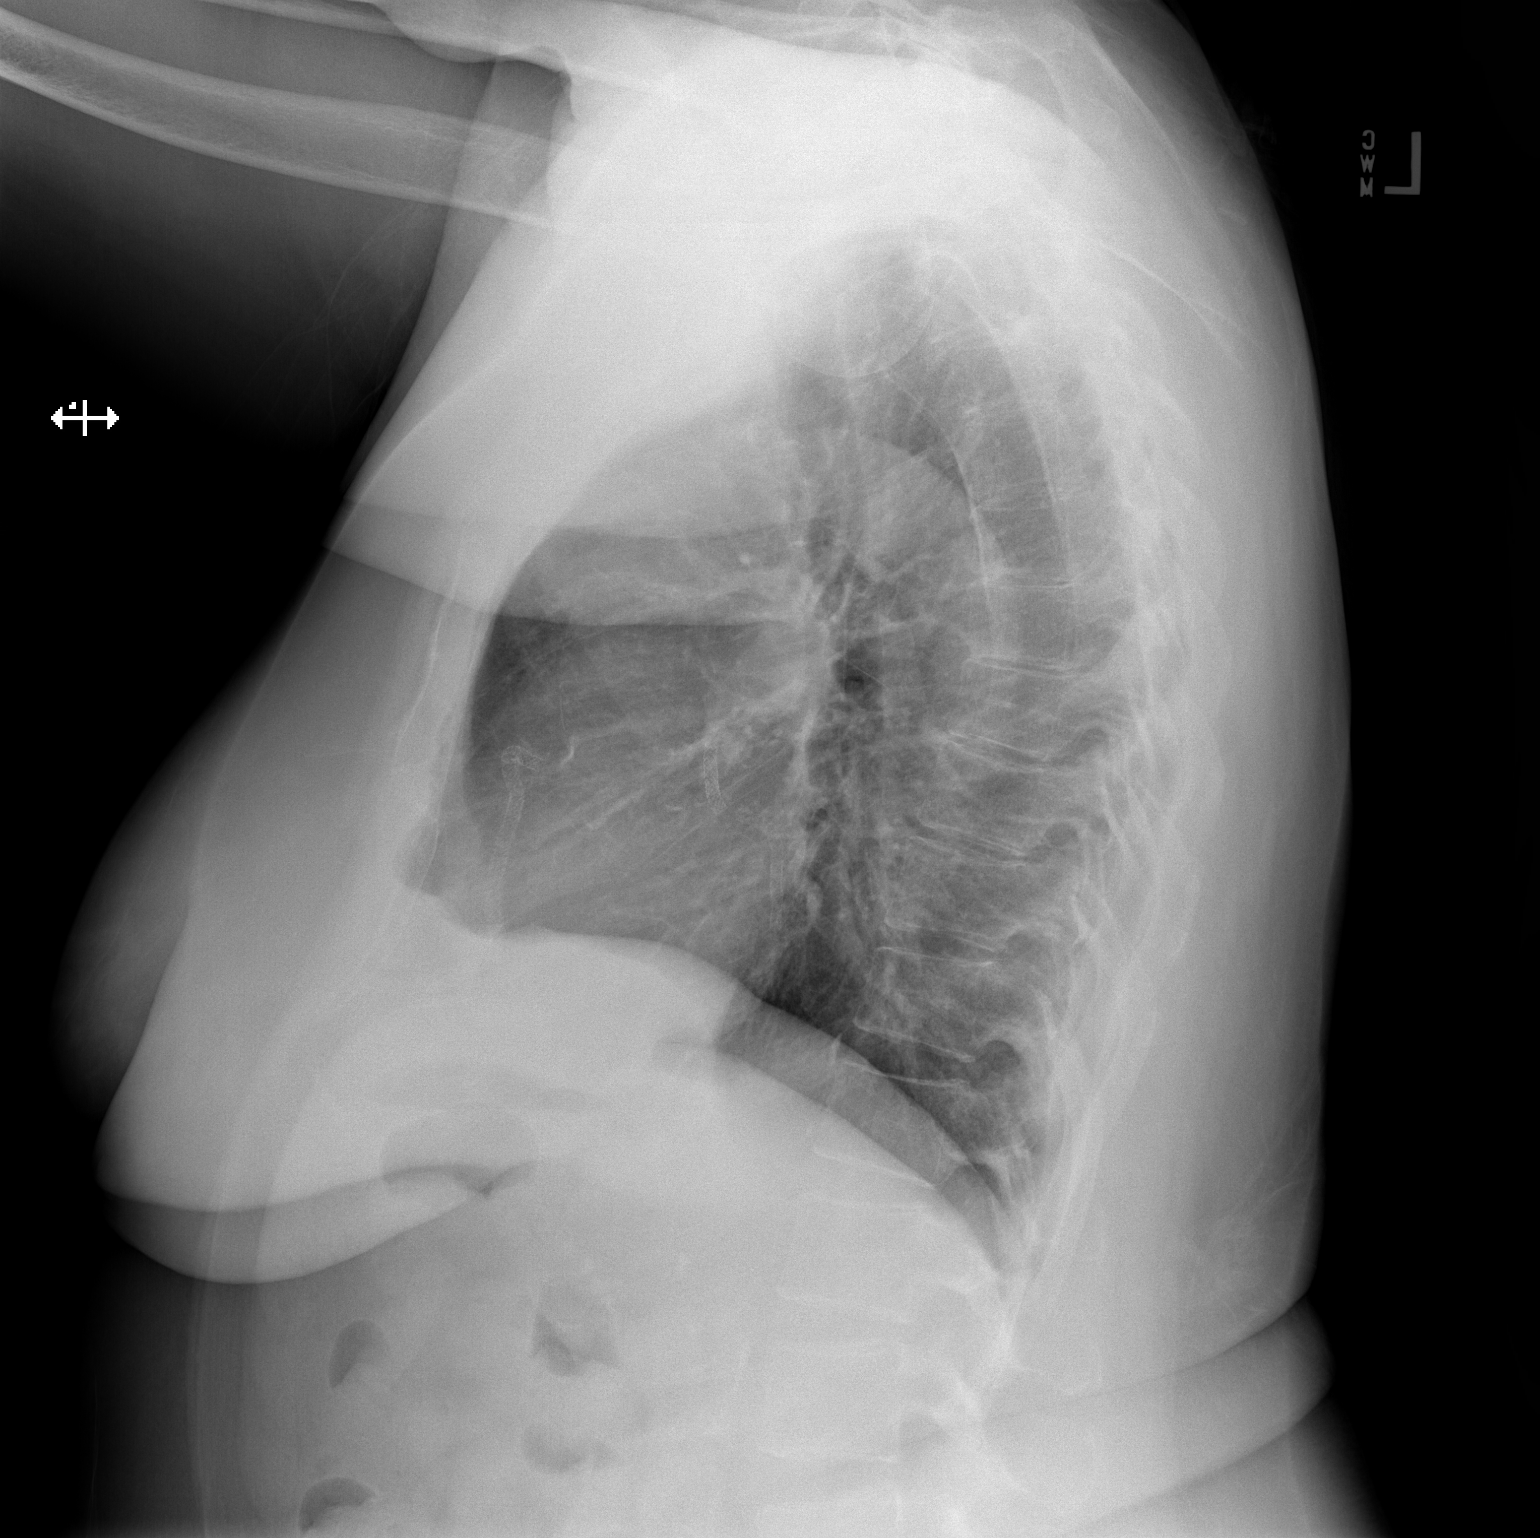

[2 of 2 positions shown; findings below may reference images not displayed]

FINDINGS: Normal heart size, mediastinal contours, and pulmonary vascularity.

Atherosclerotic calcification aorta.

Lungs clear.

No pleural effusion or pneumothorax.

Bones appear demineralized.

Multiple coronary arterial stents noted.
IMPRESSION: No acute abnormalities.

## 2019-06-04 IMAGING — CR DG HIP (WITH OR WITHOUT PELVIS) 1V PORT*R*
2 series · 2 of 2 positions shown · non-contrast
Comparison: Fluoroscopic images of same day.

CLINICAL DATA: Status post right total hip arthroplasty.

EXAM:
DG HIP (WITH OR WITHOUT PELVIS) 1V PORT RIGHT

[AP]
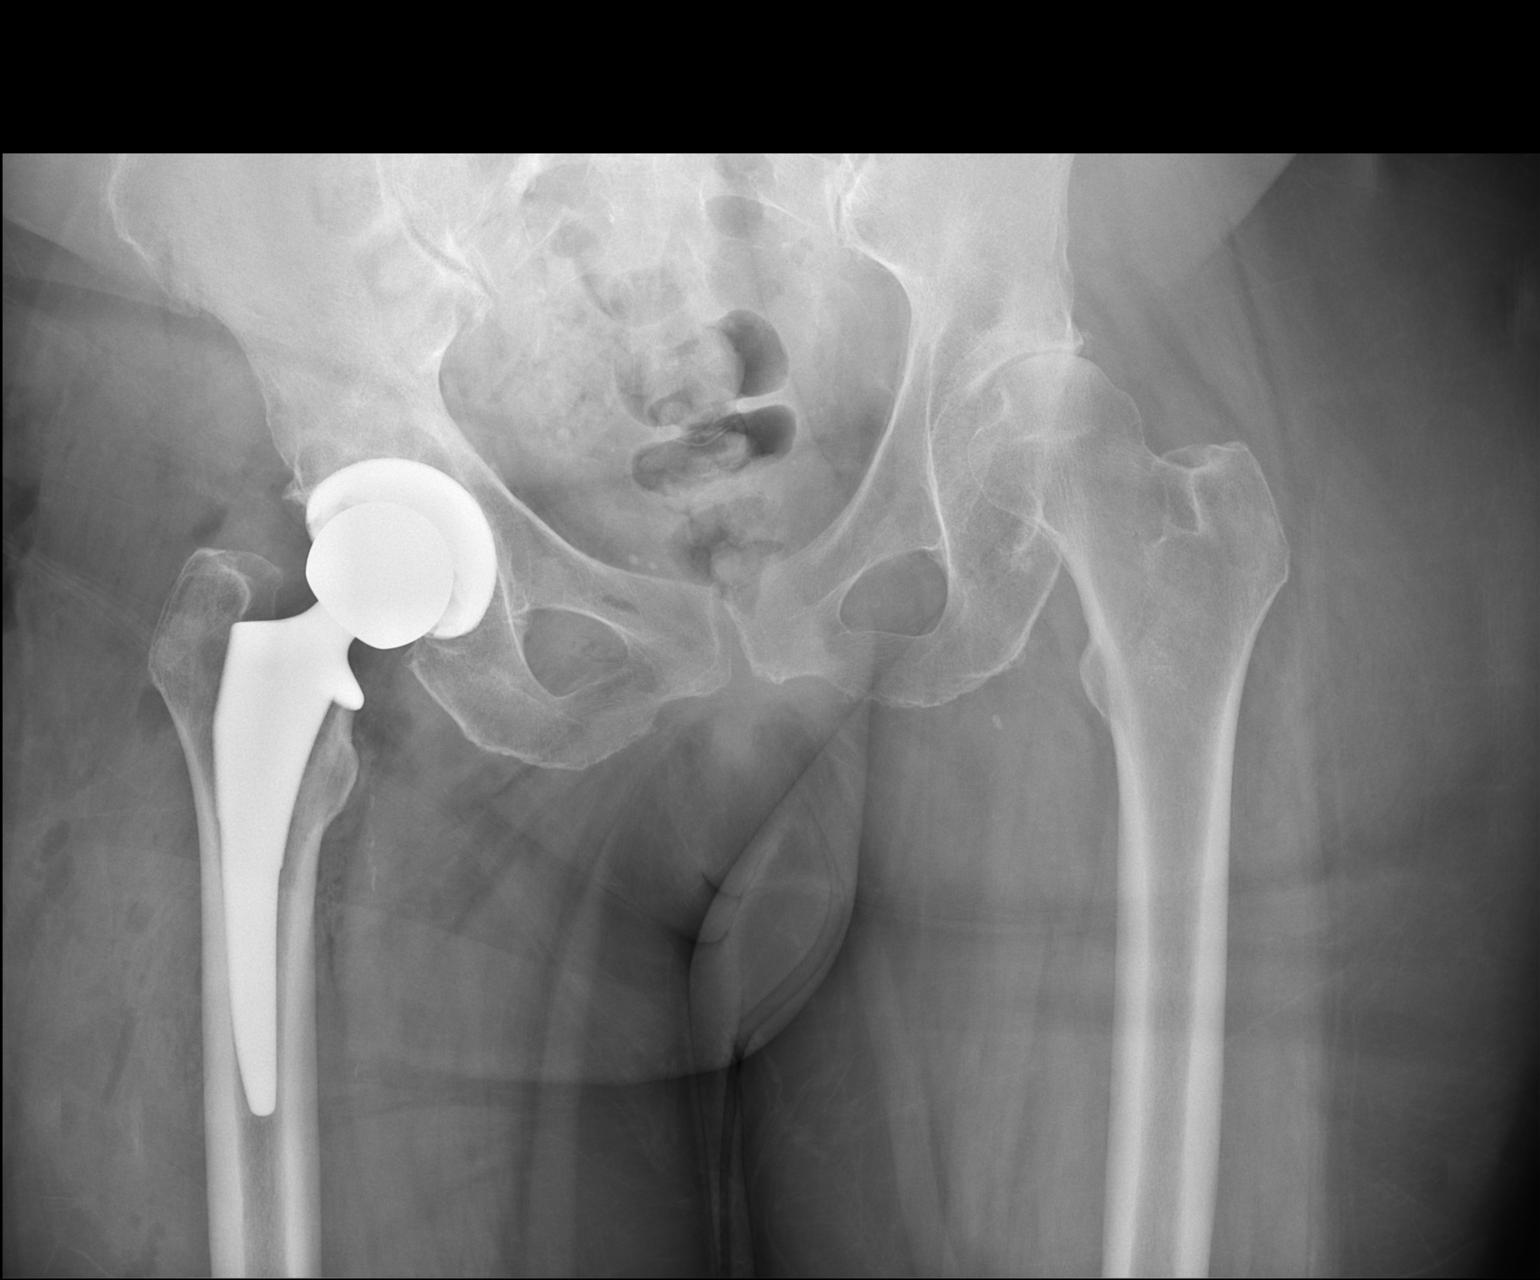

[xtable lateral]
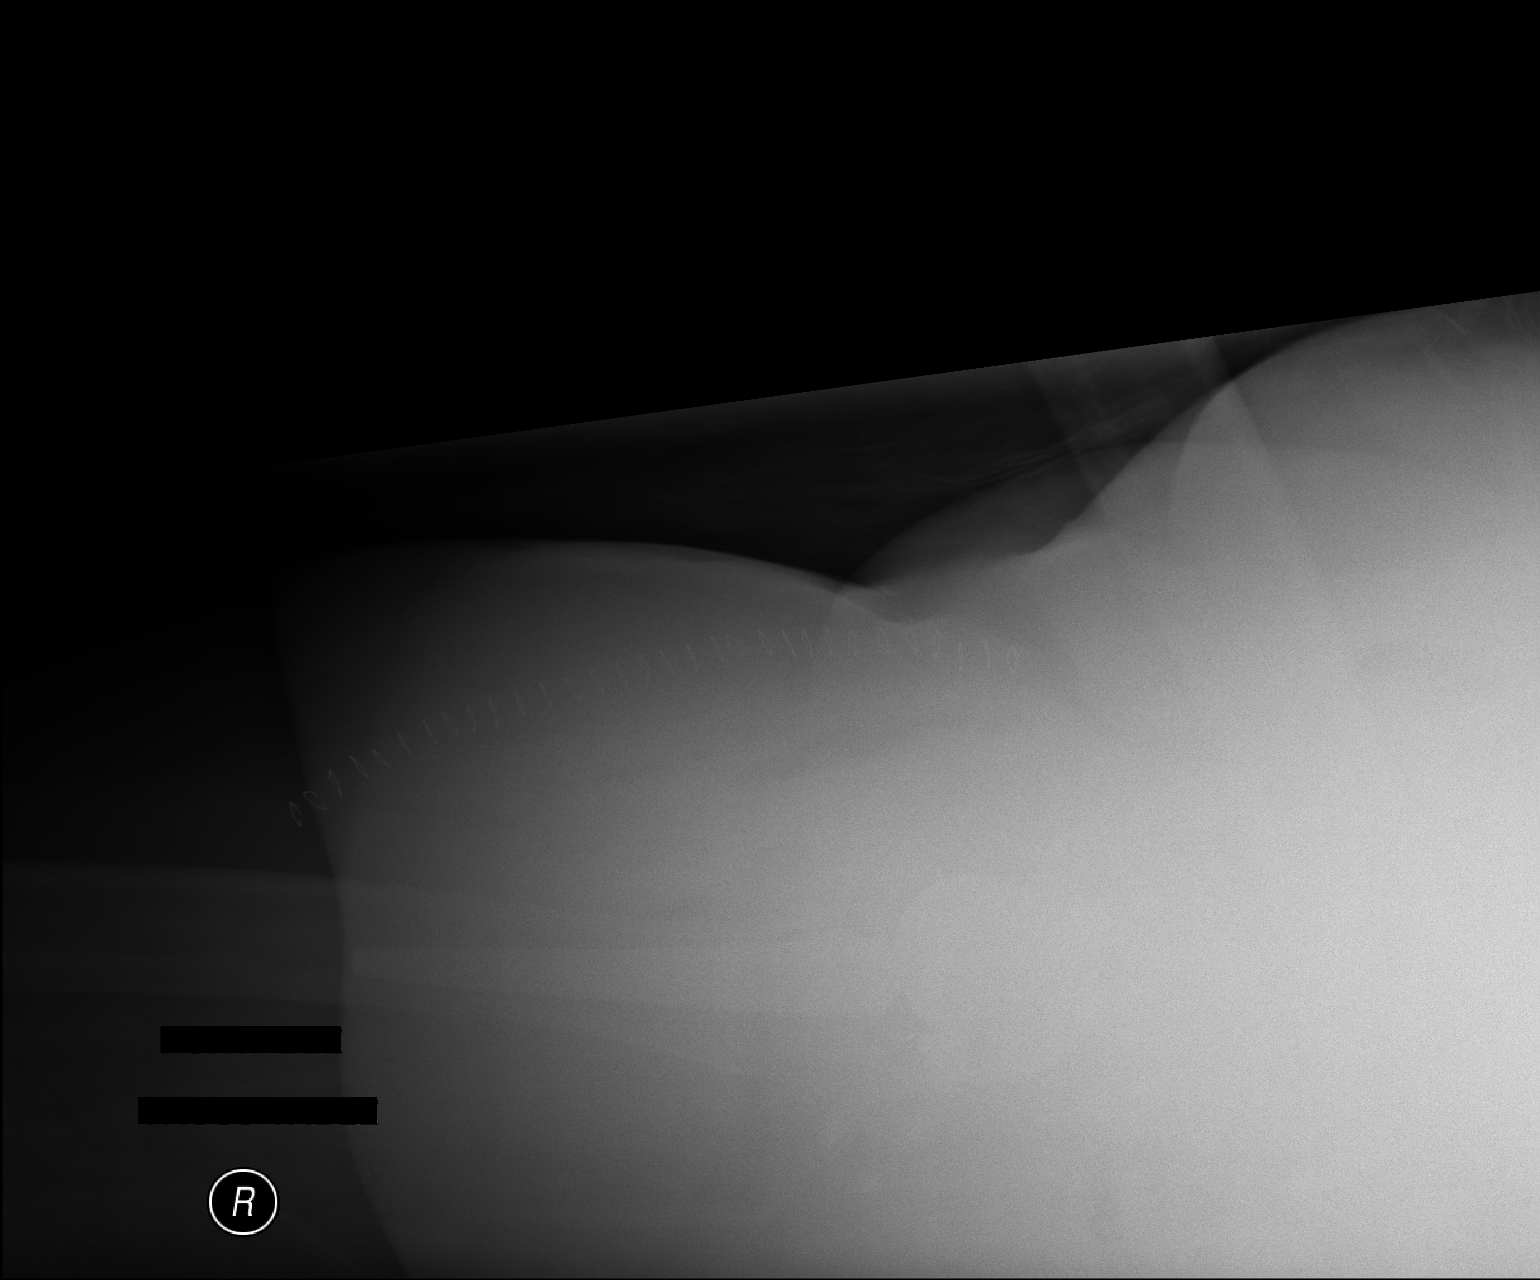

[2 of 2 positions shown; findings below may reference images not displayed]

FINDINGS: The right femoral and acetabular components appear to be well
situated. Expected postoperative changes are seen in the surrounding
soft tissues. Moderate narrowing of left hip joint is noted in
superior orientation.
IMPRESSION: Status post right total hip arthroplasty. Moderate degenerative
joint disease of the left hip.

## 2019-06-04 IMAGING — RF DG C-ARM 61-120 MIN
1 series · 3 of 3 positions shown · non-contrast
Comparison: None.

CLINICAL DATA: Status post anterior approach right total hip joint
prosthesis placement.

EXAM:
OPERATIVE right HIP (WITH PELVIS IF PERFORMED) 3 VIEWS
TECHNIQUE: Fluoroscopic spot image(s) were submitted for interpretation
post-operatively.

[Series 1: run · 3 of 3 slices shown]
[im 1/3]
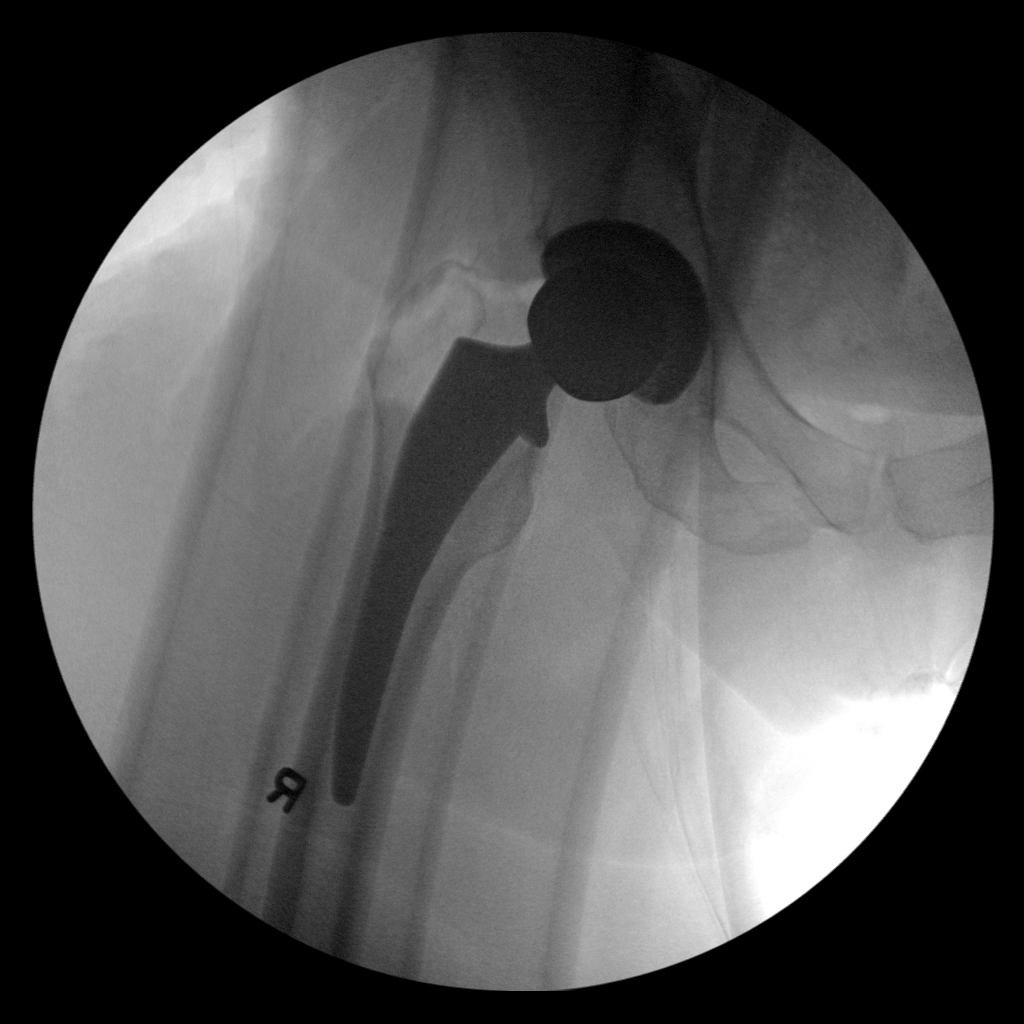
[im 2/3]
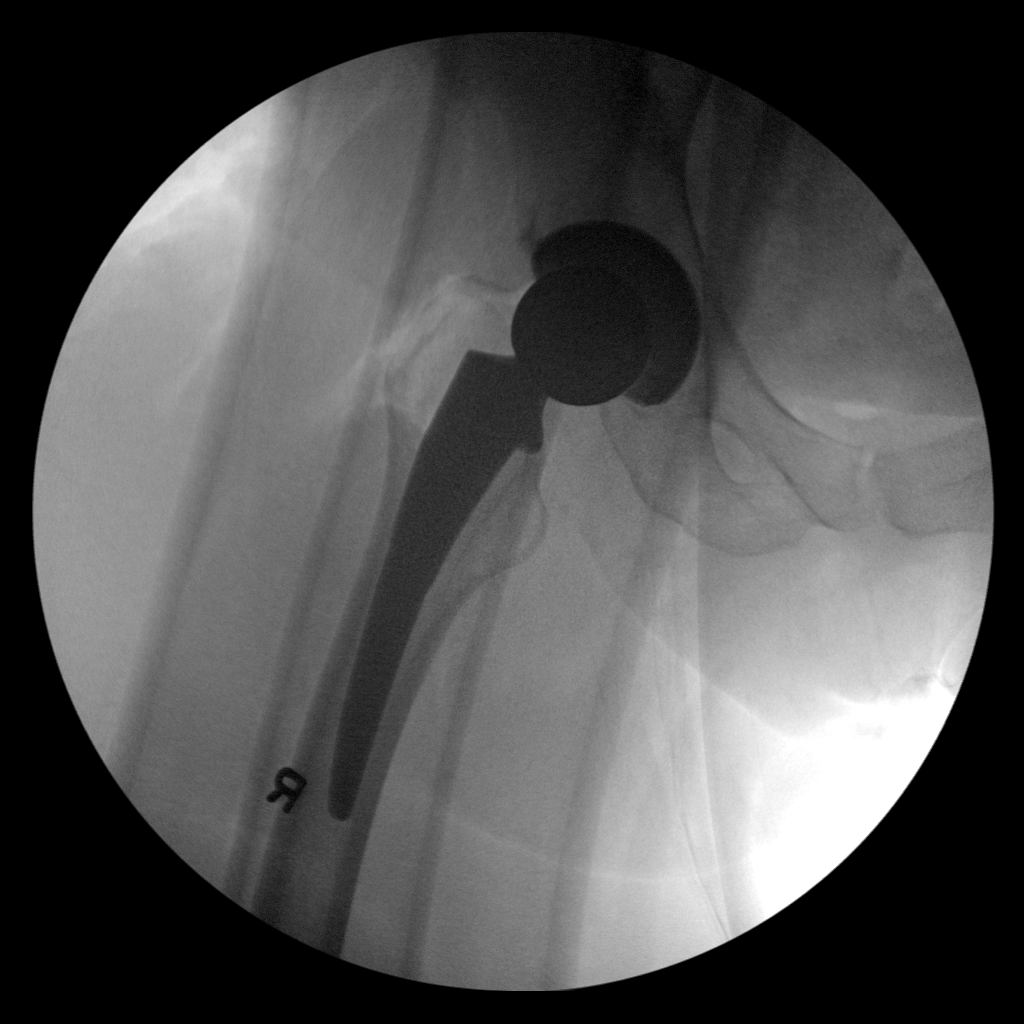
[im 3/3]
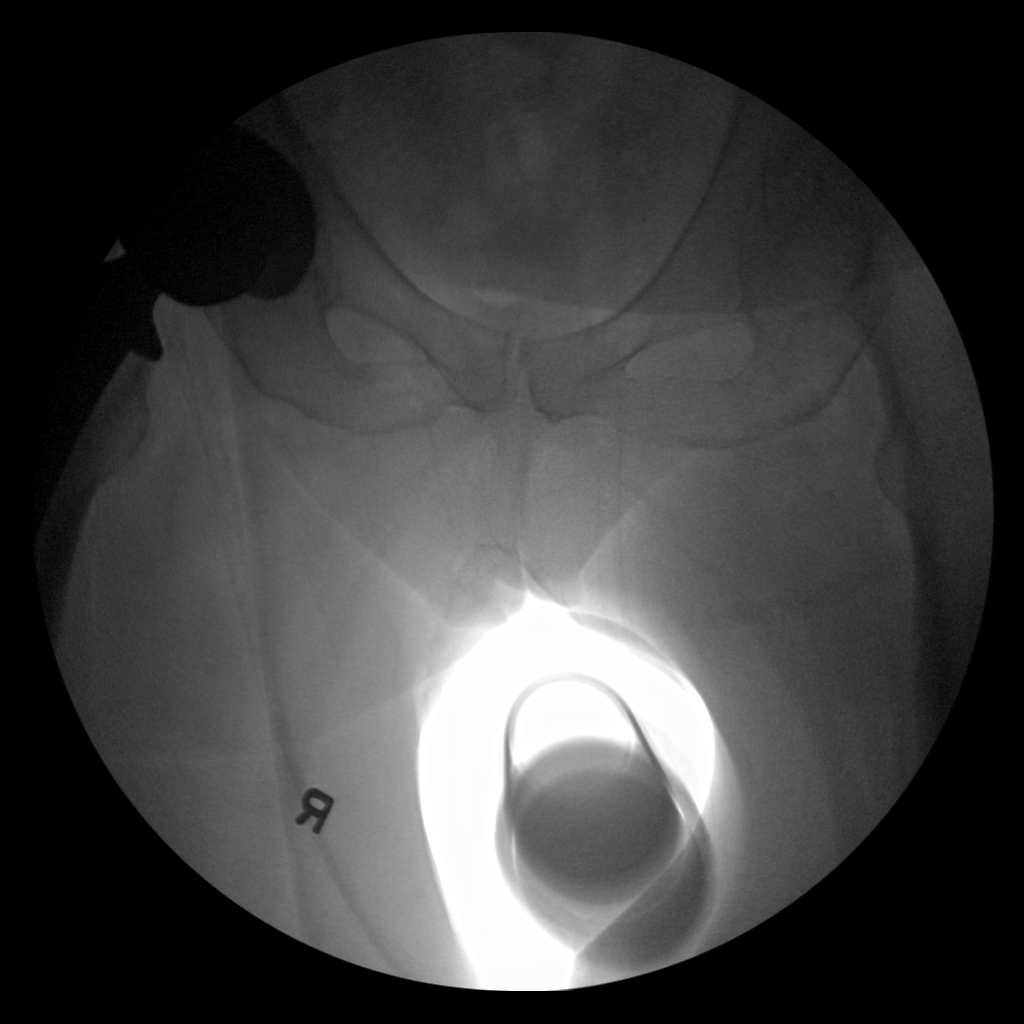

[3 of 3 positions shown; findings below may reference images not displayed]

FINDINGS: The patient has undergone right total hip joint prosthesis
placement. The interface of the prosthetic components with the
native bone is good. No acute native bone abnormality is observed.
IMPRESSION: There is no immediate postprocedure complication following anterior
approach right total hip joint prosthesis placement.

## 2019-06-05 DIAGNOSIS — I252 Old myocardial infarction: Secondary | ICD-10-CM | POA: Diagnosis not present

## 2019-06-05 DIAGNOSIS — I2510 Atherosclerotic heart disease of native coronary artery without angina pectoris: Secondary | ICD-10-CM | POA: Diagnosis not present

## 2019-06-05 DIAGNOSIS — M810 Age-related osteoporosis without current pathological fracture: Secondary | ICD-10-CM | POA: Diagnosis not present

## 2019-06-05 DIAGNOSIS — M1990 Unspecified osteoarthritis, unspecified site: Secondary | ICD-10-CM | POA: Diagnosis not present

## 2019-06-05 DIAGNOSIS — M792 Neuralgia and neuritis, unspecified: Secondary | ICD-10-CM | POA: Diagnosis not present

## 2019-06-05 DIAGNOSIS — G8929 Other chronic pain: Secondary | ICD-10-CM | POA: Diagnosis not present

## 2019-06-05 DIAGNOSIS — Z008 Encounter for other general examination: Secondary | ICD-10-CM | POA: Diagnosis not present

## 2019-06-05 DIAGNOSIS — I10 Essential (primary) hypertension: Secondary | ICD-10-CM | POA: Diagnosis not present

## 2019-06-05 DIAGNOSIS — K219 Gastro-esophageal reflux disease without esophagitis: Secondary | ICD-10-CM | POA: Diagnosis not present

## 2019-06-05 DIAGNOSIS — E785 Hyperlipidemia, unspecified: Secondary | ICD-10-CM | POA: Diagnosis not present

## 2019-06-08 ENCOUNTER — Ambulatory Visit: Payer: Medicare HMO | Admitting: Hematology and Oncology

## 2019-06-12 ENCOUNTER — Telehealth: Payer: Self-pay | Admitting: Hematology and Oncology

## 2019-06-12 ENCOUNTER — Inpatient Hospital Stay: Payer: Medicare HMO | Admitting: Hematology and Oncology

## 2019-06-12 NOTE — Assessment & Plan Note (Deleted)
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  Breast Cancer Surveillance: 1. Breast exam12/02/2019 Benign scar tissue from prior surgery 2. Mammogram 03/29/2019: Benign at [facility]. 3.  Bone density 11/02/2017: T score -2.3: Osteopenia: On Fosamax  Right hip arthroplasty July 2019 RTC in1 year

## 2019-06-12 NOTE — Telephone Encounter (Signed)
Returned patient's phone call regarding rescheduling 12/08 appointment, per patient's request appointment has moved to 12/14.

## 2019-06-18 ENCOUNTER — Inpatient Hospital Stay: Payer: Medicare HMO | Admitting: Hematology and Oncology

## 2019-06-18 NOTE — Assessment & Plan Note (Deleted)
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  Breast Cancer Surveillance: 1. Breast exam12/14/2020: Benign scar tissue from prior surgery 2. 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 3.  Bone density 11/02/2017: T score -2.3: Osteopenia: Fosamax with calcium and vitamin D started 06/06/2018  Right hip arthroplasty July 2019 RTC in1 year

## 2019-06-21 NOTE — Progress Notes (Signed)
Patient Care Team: Fanny Bien, MD as PCP - General (Family Medicine)  DIAGNOSIS:    ICD-10-CM   1. Malignant neoplasm of upper-outer quadrant of right breast in female, estrogen receptor positive ([place])  C50.411    Z17.0     SUMMARY OF ONCOLOGIC HISTORY: Oncology History  Breast cancer of upper-outer quadrant of right female breast ([place])  07/23/2014 Mammogram   Right breast (12 o'clock): New cluster of heterogenous calcs in right breast suspicious for malignancy. No other significant findings seen in right breast.   08/07/2014 Initial Biopsy   Right breast biopsy: Invasive ductal carcinoma with papillary features associated microcalcs. Grade 2. ER+ (99%), PR+ (97%), HER2- by FISH. Ki67 7%.    08/20/2014 Breast MRI   Right breast: 1.8 cm enhancement at 11:00. Left breast: 7 x 9 mm circumscribed oval mass at 9:00 stable from 2010-benign. No abnormal appearing lymph nodes.   10/17/2014 Surgery   Right breast lumpectomy with SLNB ([place]): Grade 2, residual IDC with papillary features 0.28 cm. 0/1 right SLN. 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 ER+/PR+ HER-2 negative, Ki67 7%.   10/17/2014 Pathologic Stage    pT1a, pN0: Stage IA   11/28/2014 - 01/02/2015 Radiation Therapy   Adjuvant RT completed [facility]): Right breast / 50 Gray @ 2 [name]   01/2015 -  Anti-estrogen oral therapy   Anastrazole 26m daily. Planned duration of treatment: 5 years.    02/13/2015 Survivorship   Survivorship Care Plan given/reviewed with pt.      CHIEF COMPLIANT: Follow-up of right breast cancer on anastrozole therapy  INTERVAL HISTORY: Carolyn KNIPPis a 78y.o. with above-mentioned history of right breast cancer treated with lumpectomy,radiation, and whois currently on anastrozole. Mammogram on 03/29/19 showed calcifications in the left breast measuring 0.7cm. Left breast biopsy on 05/02/19 showed no evidence of malignancy.She  presents to the clinic todayfor annual follow-up.  REVIEW OF SYSTEMS:   Constitutional: Denies fevers, chills or abnormal weight loss Eyes: Denies blurriness of vision Ears, nose, mouth, throat, and face: Denies mucositis or sore throat Respiratory: Denies cough, dyspnea or wheezes Cardiovascular: Denies palpitation, chest discomfort Gastrointestinal: Denies nausea, heartburn or change in bowel habits Skin: Denies abnormal skin rashes Lymphatics: Denies new lymphadenopathy or easy bruising Neurological: Denies numbness, tingling or new weaknesses Behavioral/Psych: Mood is stable, no new changes  Extremities: No lower extremity edema Breast: denies any pain or lumps or nodules in either breasts All other systems were reviewed with the patient and are negative.  I have reviewed the past medical history, past surgical history, social history and family history with the patient and they are unchanged from previous note.  ALLERGIES:  is allergic to penicillins.  MEDICATIONS:  Current Outpatient Medications  Medication Sig Dispense Refill  . alendronate (FOSAMAX) 70 MG tablet TAKE 1 TABLET BY MOUTH ONCE A WEEK. TAKE WITH A FULL GLASS OF WATER ON AN EMPTY STOMACH. 12 tablet 3  . amLODipine (NORVASC) 2.5 MG tablet Take 2.5 mg by mouth daily.    .Marland Kitchenanastrozole (ARIMIDEX) 1 MG tablet Take 1 tablet (1 mg total) by mouth daily. 90 tablet 3  . aspirin 325 MG tablet Take 325 mg by mouth daily.    . Cholecalciferol (VITAMIN D3) 400 units CAPS Take 400 Units by mouth daily.    . CRESTOR 20 MG tablet Take 20 mg by mouth daily.     . isosorbide mononitrate (IMDUR) 60 MG 24 hr tablet Take  1 tablet (60 mg total) by mouth daily. 30 tablet 1  . meloxicam (MOBIC) 7.5 MG tablet Take 7.5 mg by mouth daily.    . metoprolol succinate (TOPROL-XL) 50 MG 24 hr tablet Take 50 mg by mouth daily. Take with or immediately following a meal.    . Multiple Minerals-Vitamins (CALCIUM & VIT D3 BONE HEALTH PO) Take 1  tablet by mouth 2 (two) times a week.    . nitroGLYCERIN (NITROSTAT) 0.4 MG SL tablet Place 1 tablet (0.4 mg total) under the tongue every 5 (five) minutes as needed for chest pain. 30 tablet 1  . omeprazole (PRILOSEC) 40 MG capsule Take 40 mg by mouth daily.  1  . spironolactone (ALDACTONE) 25 MG tablet Take 25 mg by mouth daily.    . traMADol (ULTRAM) 50 MG tablet Take 1 tablet (50 mg total) by mouth 2 (two) times daily as needed. 40 tablet 0   No current facility-administered medications for this visit.    PHYSICAL EXAMINATION: ECOG PERFORMANCE STATUS: 1 - Symptomatic but completely ambulatory  There were no vitals filed for this visit. There were no vitals filed for this visit.  GENERAL: alert, no distress and comfortable SKIN: skin color, texture, turgor are normal, no rashes or significant lesions EYES: normal, Conjunctiva are pink and non-injected, sclera clear OROPHARYNX: no exudate, no erythema and lips, buccal mucosa, and tongue normal  NECK: supple, thyroid normal size, non-tender, without nodularity LYMPH: no palpable lymphadenopathy in the cervical, axillary or inguinal LUNGS: clear to auscultation and percussion with normal breathing effort HEART: regular rate & rhythm and no murmurs and no lower extremity edema ABDOMEN: abdomen soft, non-tender and normal bowel sounds MUSCULOSKELETAL: no cyanosis of digits and no clubbing  NEURO: alert & oriented x 3 with fluent speech, no focal motor/sensory deficits EXTREMITIES: No lower extremity edema BREAST: No palpable masses or nodules in either right or left breasts. No palpable axillary supraclavicular or infraclavicular adenopathy no breast tenderness or nipple discharge. (exam performed in the presence of a chaperone)  LABORATORY DATA:  I have reviewed the data as listed CMP Latest Ref Rng & Units 12/20/2017 12/09/2017 12/29/2015  Glucose 65 - 99 mg/dL 138(H) 104(H) 91  BUN 6 - 20 mg/dL _0 Creatinine 0.44 - 1.00 mg/dL  0.80 0.75 0.80  Sodium 135 - 145 mmol/L 139 141 140  Potassium 3.5 - 5.1 mmol/L 4.2 3.9 4.5  Chloride 101 - 111 mmol/L 106 107 106  CO2 22 - 32 mmol/L _1 Calcium 8.9 - 10.3 mg/dL 9.0 9.3 9.4  Total Protein 6.5 - 8.1 g/dL - 6.9 7.0  Total Bilirubin 0.3 - 1.2 mg/dL - 0.5 0.8  Alkaline Phos 38 - 126 U/L - 46 54  AST 15 - 41 U/L - 16 22  ALT 14 - 54 U/L - 11(L) 14    Lab Results  Component Value Date   WBC 9.8 12/22/2017   HGB 9.7 (L) 12/22/2017   HCT 28.0 (L) 12/22/2017   MCV 78.7 12/22/2017   PLT 105 (L) 12/22/2017   NEUTROABS 3.6 11/07/2014    ASSESSMENT & PLAN:  Breast cancer of upper-outer quadrant of right female breast Right breast lumpectomy 10/17/2014: 0.28 cm focus of residual IDC grade 2 with papillary features, 0/1 sentinel node, background of sclerosing lymphocytic lobulitis, fibrocystic changes, E 99%, PR 97%, HER-2 negative, Ki-67 7%, T1a N0 M0 stage IA S/P XRT 01/02/15, Started anastrozole August 2016  Anastrozole Toxicities:  Denies any side effects  to anastrozole.  Breast Cancer Surveillance: 1. Breast exam12/18/2020: Benign scar tissue from prior surgery 2. Mammogram   05/02/2019 at [facility].  Stereotactic biopsy of calcifications left breast UOQ 0.7 cm area: Fibroadenomatous changes 3.  Bone density 11/02/2017: T score -2.3: Osteopenia: On Fosamax with calcium and vitamin D  Right hip arthroplasty July 2019 RTC in1 year  No orders of the defined types were placed in this encounter.  The patient has a good understanding of the overall plan. she agrees with it. she will call with any problems that may develop before the next visit here.  Carolyn Lose, MD 06/22/2019  Carolyn Rowland, am acting as scribe for Dr. Nicholas Rowland.  I have reviewed the above document for accuracy and completeness, and I agree with the above.

## 2019-06-22 ENCOUNTER — Telehealth: Payer: Self-pay | Admitting: Hematology and Oncology

## 2019-06-22 ENCOUNTER — Inpatient Hospital Stay: Payer: Medicare HMO | Attending: Hematology and Oncology | Admitting: Hematology and Oncology

## 2019-06-22 ENCOUNTER — Other Ambulatory Visit: Payer: Self-pay

## 2019-06-22 DIAGNOSIS — K219 Gastro-esophageal reflux disease without esophagitis: Secondary | ICD-10-CM | POA: Diagnosis not present

## 2019-06-22 DIAGNOSIS — Z79899 Other long term (current) drug therapy: Secondary | ICD-10-CM | POA: Insufficient documentation

## 2019-06-22 DIAGNOSIS — Z791 Long term (current) use of non-steroidal anti-inflammatories (NSAID): Secondary | ICD-10-CM | POA: Insufficient documentation

## 2019-06-22 DIAGNOSIS — I10 Essential (primary) hypertension: Secondary | ICD-10-CM | POA: Diagnosis not present

## 2019-06-22 DIAGNOSIS — C50411 Malignant neoplasm of upper-outer quadrant of right female breast: Secondary | ICD-10-CM | POA: Diagnosis not present

## 2019-06-22 DIAGNOSIS — Z923 Personal history of irradiation: Secondary | ICD-10-CM | POA: Insufficient documentation

## 2019-06-22 DIAGNOSIS — M8580 Other specified disorders of bone density and structure, unspecified site: Secondary | ICD-10-CM | POA: Insufficient documentation

## 2019-06-22 DIAGNOSIS — Z170 Estrogen receptor positive status [ER+]: Secondary | ICD-10-CM

## 2019-06-22 DIAGNOSIS — Z79811 Long term (current) use of aromatase inhibitors: Secondary | ICD-10-CM | POA: Diagnosis not present

## 2019-06-22 DIAGNOSIS — Z7982 Long term (current) use of aspirin: Secondary | ICD-10-CM | POA: Diagnosis not present

## 2019-06-22 MED ORDER — ANASTROZOLE 1 MG PO TABS: 1.0000 mg | ORAL_TABLET | Freq: Every day | ORAL | 3 refills | Status: DC

## 2019-06-22 NOTE — Telephone Encounter (Signed)
I talk with patient regarding schedule  

## 2019-06-22 NOTE — Assessment & Plan Note (Signed)
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  Breast Cancer Surveillance: 1. Breast exam12/18/2020: Benign scar tissue from prior surgery 2. Mammogram   05/02/2019 at [facility].  Stereotactic biopsy of calcifications left breast UOQ 0.7 cm area: Fibroadenomatous changes 3.  Bone density 11/02/2017: T score -2.3: Osteopenia: On Fosamax with calcium and vitamin D  Right hip arthroplasty July 2019 RTC in1 year

## 2019-08-03 ENCOUNTER — Ambulatory Visit: Payer: Medicare HMO | Attending: Internal Medicine

## 2019-08-03 DIAGNOSIS — I350 Nonrheumatic aortic (valve) stenosis: Secondary | ICD-10-CM | POA: Diagnosis not present

## 2019-08-03 DIAGNOSIS — Z20822 Contact with and (suspected) exposure to covid-19: Secondary | ICD-10-CM

## 2019-08-03 DIAGNOSIS — M1990 Unspecified osteoarthritis, unspecified site: Secondary | ICD-10-CM | POA: Diagnosis not present

## 2019-08-03 DIAGNOSIS — R69 Illness, unspecified: Secondary | ICD-10-CM | POA: Diagnosis not present

## 2019-08-03 DIAGNOSIS — I10 Essential (primary) hypertension: Secondary | ICD-10-CM | POA: Diagnosis not present

## 2019-08-03 DIAGNOSIS — E785 Hyperlipidemia, unspecified: Secondary | ICD-10-CM | POA: Diagnosis not present

## 2019-08-03 DIAGNOSIS — I25118 Atherosclerotic heart disease of native coronary artery with other forms of angina pectoris: Secondary | ICD-10-CM | POA: Diagnosis not present

## 2019-08-04 LAB — NOVEL CORONAVIRUS, NAA: SARS-CoV-2, NAA: NOT DETECTED

## 2019-08-07 ENCOUNTER — Telehealth: Payer: Self-pay

## 2019-08-07 NOTE — Telephone Encounter (Signed)
Patient called in requesting [facility] lab results - DOB/Address verified - Negative results give, no further questions.

## 2019-08-22 DIAGNOSIS — M1712 Unilateral primary osteoarthritis, left knee: Secondary | ICD-10-CM | POA: Diagnosis not present

## 2019-08-22 DIAGNOSIS — I2510 Atherosclerotic heart disease of native coronary artery without angina pectoris: Secondary | ICD-10-CM | POA: Diagnosis not present

## 2019-08-22 DIAGNOSIS — K219 Gastro-esophageal reflux disease without esophagitis: Secondary | ICD-10-CM | POA: Diagnosis not present

## 2019-08-22 DIAGNOSIS — M1610 Unilateral primary osteoarthritis, unspecified hip: Secondary | ICD-10-CM | POA: Diagnosis not present

## 2019-08-31 DIAGNOSIS — R079 Chest pain, unspecified: Secondary | ICD-10-CM | POA: Diagnosis not present

## 2019-09-03 DIAGNOSIS — Z20828 Contact with and (suspected) exposure to other viral communicable diseases: Secondary | ICD-10-CM | POA: Diagnosis not present

## 2019-09-05 DIAGNOSIS — M1712 Unilateral primary osteoarthritis, left knee: Secondary | ICD-10-CM | POA: Diagnosis not present

## 2019-09-05 DIAGNOSIS — B354 Tinea corporis: Secondary | ICD-10-CM | POA: Diagnosis not present

## 2019-09-27 DIAGNOSIS — E782 Mixed hyperlipidemia: Secondary | ICD-10-CM | POA: Diagnosis not present

## 2019-09-27 DIAGNOSIS — J309 Allergic rhinitis, unspecified: Secondary | ICD-10-CM | POA: Diagnosis not present

## 2019-09-27 DIAGNOSIS — Z0000 Encounter for general adult medical examination without abnormal findings: Secondary | ICD-10-CM | POA: Diagnosis not present

## 2019-09-27 DIAGNOSIS — I10 Essential (primary) hypertension: Secondary | ICD-10-CM | POA: Diagnosis not present

## 2019-09-27 DIAGNOSIS — Z6836 Body mass index (BMI) 36.0-36.9, adult: Secondary | ICD-10-CM | POA: Diagnosis not present

## 2019-10-10 DIAGNOSIS — Z0100 Encounter for examination of eyes and vision without abnormal findings: Secondary | ICD-10-CM | POA: Diagnosis not present

## 2019-11-06 DIAGNOSIS — I25118 Atherosclerotic heart disease of native coronary artery with other forms of angina pectoris: Secondary | ICD-10-CM | POA: Diagnosis not present

## 2019-11-06 DIAGNOSIS — E785 Hyperlipidemia, unspecified: Secondary | ICD-10-CM | POA: Diagnosis not present

## 2019-11-06 DIAGNOSIS — I10 Essential (primary) hypertension: Secondary | ICD-10-CM | POA: Diagnosis not present

## 2019-11-06 DIAGNOSIS — I350 Nonrheumatic aortic (valve) stenosis: Secondary | ICD-10-CM | POA: Diagnosis not present

## 2019-11-06 DIAGNOSIS — R69 Illness, unspecified: Secondary | ICD-10-CM | POA: Diagnosis not present

## 2019-11-06 DIAGNOSIS — R0789 Other chest pain: Secondary | ICD-10-CM | POA: Diagnosis not present

## 2019-11-06 DIAGNOSIS — K219 Gastro-esophageal reflux disease without esophagitis: Secondary | ICD-10-CM | POA: Diagnosis not present

## 2019-11-06 DIAGNOSIS — M1990 Unspecified osteoarthritis, unspecified site: Secondary | ICD-10-CM | POA: Diagnosis not present

## 2019-12-05 ENCOUNTER — Other Ambulatory Visit: Payer: Self-pay

## 2019-12-12 ENCOUNTER — Ambulatory Visit: Payer: Medicare HMO | Admitting: Surgery

## 2019-12-18 NOTE — Pre-Procedure Instructions (Signed)
Your procedure is scheduled on Wednesday, June 23rd from 12:30 PM- 2:50 PM.  Report to [facility] Main Entrance "A" at 10:30 A.M., and check in at the Admitting office.  Call this number if you have problems the morning of surgery:  [phone]  Call [phone] if you have any questions prior to your surgery date Monday-Friday 8am-4pm.    Remember:  Do not eat after midnight the night before your surgery.  You may drink clear liquids until 09:30 AM the morning of your surgery.   Clear liquids allowed are: Water, Non-Citrus Juices (without pulp), Carbonated Beverages, Clear Tea, Black Coffee Only, and Gatorade.   Enhanced Recovery after Surgery for Orthopedics Enhanced Recovery after Surgery is a protocol used to improve the stress on your body and your recovery after surgery.  Patient Instructions  . The night before surgery:  o No food after midnight. ONLY clear liquids after midnight   . The day of surgery (if you do NOT have diabetes):  o Drink ONE (1) Pre-Surgery Clear Ensure 09:30 AM.   o This drink was given to you during your hospital  pre-op appointment visit. o Nothing else to drink after completing the  Pre-Surgery Clear Ensure.         If you have questions, please contact your surgeon's office.     Take these medicines the morning of surgery with A SIP OF WATER: amLODipine (NORVASC)  anastrozole (ARIMIDEX)  CRESTOR  isosorbide mononitrate (IMDUR) metoprolol succinate (TOPROL-XL)  IF NEEDED: acetaminophen (TYLENOL) nitroGLYCERIN (NITROSTAT) omeprazole (PRILOSEC)  *Follow your surgeon's instructions on when to stop Aspirin.  If no instructions were given by your surgeon then you will need to call the office to get those instructions.     As of today, STOP taking any Aleve, Naproxen, Ibuprofen, Motrin, Advil, Goody's, BC's, all herbal medications, fish oil, and all vitamins.          The Morning of Surgery:            Do not wear jewelry, make up, or  nail polish.            Do not wear lotions, powders, perfumes, or deodorant.            Do not shave 48 hours prior to surgery.              Do not bring valuables to the hospital.            [facility] is not responsible for any belongings or valuables.  Do NOT Smoke (Tobacco/Vapping) or drink Alcohol 24 hours prior to your procedure.  If you use a CPAP at night, you may bring all equipment for your overnight stay.   Contacts, glasses, dentures or bridgework may not be worn into surgery.      For patients admitted to the hospital, discharge time will be determined by your treatment team.   Patients discharged the day of surgery will not be allowed to drive home, and someone needs to stay with them for 24 hours.    Special instructions:   [facility]- Preparing For Surgery  Before surgery, you can play an important role. Because skin is not sterile, your skin needs to be as free of germs as possible. You can reduce the number of germs on your skin by washing with CHG (chlorahexidine gluconate) Soap before surgery.  CHG is an antiseptic cleaner which kills germs and bonds with the skin to continue killing germs even after washing.  Oral Hygiene is also important to reduce your risk of infection.  Remember - BRUSH YOUR TEETH THE MORNING OF SURGERY WITH YOUR REGULAR TOOTHPASTE  Please do not use if you have an allergy to CHG or antibacterial soaps. If your skin becomes reddened/irritated stop using the CHG.  Do not shave (including legs and underarms) for at least 48 hours prior to first CHG shower. It is OK to shave your face.  Please follow these instructions carefully.   1. Shower the NIGHT BEFORE SURGERY and the MORNING OF SURGERY with CHG Soap.   2. If you chose to wash your hair, wash your hair first as usual with your normal shampoo.  3. After you shampoo, rinse your hair and body thoroughly to remove the shampoo.  4. Use CHG as you would any other liquid soap. You can  apply CHG directly to the skin and wash gently with a scrungie or a clean washcloth.   5. Apply the CHG Soap to your body ONLY FROM THE NECK DOWN.  Do not use on open wounds or open sores. Avoid contact with your eyes, ears, mouth and genitals (private parts). Wash Face and genitals (private parts)  with your normal soap.   6. Wash thoroughly, paying special attention to the area where your surgery will be performed.  7. Thoroughly rinse your body with warm water from the neck down.  8. DO NOT shower/wash with your normal soap after using and rinsing off the CHG Soap.  9. Pat yourself dry with a CLEAN TOWEL.  10. Wear CLEAN PAJAMAS to bed the night before surgery, wear comfortable clothes the morning of surgery  11. Place CLEAN SHEETS on your bed the night of your first shower and DO NOT SLEEP WITH PETS.   Day of Surgery: Shower with CHG Soap.  Do not apply any deodorants/lotions.  Please wear clean clothes to the hospital/surgery center.   Remember to brush your teeth WITH YOUR REGULAR TOOTHPASTE.   Please read over the following fact sheets that you were given.

## 2019-12-19 ENCOUNTER — Encounter (HOSPITAL_COMMUNITY): Payer: Self-pay

## 2019-12-19 ENCOUNTER — Ambulatory Visit (HOSPITAL_COMMUNITY)
Admission: RE | Admit: 2019-12-19 | Discharge: 2019-12-19 | Disposition: A | Discharge status: Home / Self Care | Payer: Medicare HMO | Source: Ambulatory Visit | Attending: Surgery | Admitting: Surgery

## 2019-12-19 ENCOUNTER — Other Ambulatory Visit: Payer: Self-pay

## 2019-12-19 ENCOUNTER — Encounter (HOSPITAL_COMMUNITY)
Admission: RE | Admit: 2019-12-19 | Discharge: 2019-12-19 | Disposition: A | Discharge status: Home / Self Care | Payer: Medicare HMO | Source: Ambulatory Visit | Attending: Orthopaedic Surgery | Admitting: Orthopaedic Surgery

## 2019-12-19 DIAGNOSIS — I10 Essential (primary) hypertension: Secondary | ICD-10-CM | POA: Insufficient documentation

## 2019-12-19 DIAGNOSIS — M1612 Unilateral primary osteoarthritis, left hip: Secondary | ICD-10-CM | POA: Insufficient documentation

## 2019-12-19 DIAGNOSIS — E7800 Pure hypercholesterolemia, unspecified: Secondary | ICD-10-CM | POA: Diagnosis not present

## 2019-12-19 DIAGNOSIS — Z87891 Personal history of nicotine dependence: Secondary | ICD-10-CM | POA: Insufficient documentation

## 2019-12-19 DIAGNOSIS — J984 Other disorders of lung: Secondary | ICD-10-CM | POA: Diagnosis not present

## 2019-12-19 DIAGNOSIS — Z923 Personal history of irradiation: Secondary | ICD-10-CM | POA: Insufficient documentation

## 2019-12-19 DIAGNOSIS — Z01818 Encounter for other preprocedural examination: Secondary | ICD-10-CM | POA: Diagnosis not present

## 2019-12-19 DIAGNOSIS — Z853 Personal history of malignant neoplasm of breast: Secondary | ICD-10-CM | POA: Diagnosis not present

## 2019-12-19 DIAGNOSIS — K219 Gastro-esophageal reflux disease without esophagitis: Secondary | ICD-10-CM | POA: Insufficient documentation

## 2019-12-19 DIAGNOSIS — Z7982 Long term (current) use of aspirin: Secondary | ICD-10-CM | POA: Insufficient documentation

## 2019-12-19 DIAGNOSIS — Z09 Encounter for follow-up examination after completed treatment for conditions other than malignant neoplasm: Secondary | ICD-10-CM | POA: Diagnosis not present

## 2019-12-19 DIAGNOSIS — Z955 Presence of coronary angioplasty implant and graft: Secondary | ICD-10-CM | POA: Insufficient documentation

## 2019-12-19 DIAGNOSIS — Z7983 Long term (current) use of bisphosphonates: Secondary | ICD-10-CM | POA: Diagnosis not present

## 2019-12-19 DIAGNOSIS — Z96651 Presence of right artificial knee joint: Secondary | ICD-10-CM | POA: Diagnosis not present

## 2019-12-19 DIAGNOSIS — I2510 Atherosclerotic heart disease of native coronary artery without angina pectoris: Secondary | ICD-10-CM | POA: Insufficient documentation

## 2019-12-19 DIAGNOSIS — Z79811 Long term (current) use of aromatase inhibitors: Secondary | ICD-10-CM | POA: Diagnosis not present

## 2019-12-19 DIAGNOSIS — Z79899 Other long term (current) drug therapy: Secondary | ICD-10-CM | POA: Insufficient documentation

## 2019-12-19 LAB — COMPREHENSIVE METABOLIC PANEL
ALT: 11 U/L (ref 0–44)
AST: 17 U/L (ref 15–41)
Albumin: 3.6 g/dL (ref 3.5–5.0)
Alkaline Phosphatase: 41 U/L (ref 38–126)
Anion gap: 8 (ref 5–15)
BUN: 15 mg/dL (ref 8–23)
CO2: 27 mmol/L (ref 22–32)
Calcium: 9.1 mg/dL (ref 8.9–10.3)
Chloride: 106 mmol/L (ref 98–111)
Creatinine, Ser: 0.77 mg/dL (ref 0.44–1.00)
GFR calc Af Amer: >60 mL/min (ref >60)
GFR calc non Af Amer: >60 mL/min (ref >60)
Glucose, Bld: 76 mg/dL (ref 70–99)
Potassium: 4 mmol/L (ref 3.5–5.1)
Sodium: 141 mmol/L (ref 135–145)
Total Bilirubin: 0.7 mg/dL (ref 0.3–1.2)
Total Protein: 6.9 g/dL (ref 6.5–8.1)

## 2019-12-19 LAB — SURGICAL PCR SCREEN
MRSA, PCR: NEGATIVE
Staphylococcus aureus: NEGATIVE

## 2019-12-19 LAB — CBC
HCT: 39.9 % (ref 36.0–46.0)
Hemoglobin: 13.3 g/dL (ref 12.0–15.0)
MCH: 27.5 pg (ref 26.0–34.0)
MCHC: 33.3 g/dL (ref 30.0–36.0)
MCV: 82.6 fL (ref 80.0–100.0)
Platelets: 176 10*3/uL (ref 150–400)
RBC: 4.83 MIL/uL (ref 3.87–5.11)
RDW: 14.8 % (ref 11.5–15.5)
WBC: 5.1 10*3/uL (ref 4.0–10.5)
nRBC: 0 % (ref 0.0–0.2)

## 2019-12-19 NOTE — Progress Notes (Signed)
PCP - Dr. Rachell Cipro Cardiologist - Dr. Ophelia Charter   Chest x-ray - 12/19/19 EKG - 12/19/19 Stress Test - 04/27/13 ECHO - 08/01/13 Cardiac Cath - 08/07/13  Sleep Study - denies  Aspirin Instructions: last dose 12/21/19  ERAS Protcol -clear liquids until 0930 DOS PRE-SURGERY Ensure or G2- Ensure by 0930 DOS  COVID TEST-  12/24/19 at [facility]. Pt instructed to remain in their car. Educated on Transport planner until [employer].     Anesthesia review: cardiac history  Patient denies shortness of breath, fever, cough and chest pain at PAT appointment   All instructions explained to the patient, with a verbal understanding of the material. Patient agrees to go over the instructions while at home for a better understanding. Patient also instructed to self quarantine after being tested for COVID-19. The opportunity to ask questions was provided.

## 2019-12-20 ENCOUNTER — Encounter (HOSPITAL_COMMUNITY): Payer: Self-pay

## 2019-12-20 NOTE — Progress Notes (Signed)
Anesthesia Chart Review:  Case: [id number] Date/Time: 12/26/19 1215   Procedure: TOTAL HIP ARTHROPLASTY-DIRECT ANTERIOR (Left Hip)   Anesthesia type: Spinal   Pre-op diagnosis: left hip osteoarthritis   Location: [facility] OR ROOM 03 / [place] OR   Surgeons: Marybelle Killings, MD      DISCUSSION: Patient is a 79 year old female scheduled for the above procedure.   History includes former smoker (quit 04/04/14), murmur/AS (moderate AS by 08/31/19 echo), CAD (angina, s/p proximal and distal CX DES x4, DES to mid-distal RCA 08/17/13), HTN, hypercholesterolemia, GERD, hiatal hernia, right breast cancer (s/p right partial mastectomy, right axillary LN biopsy 10/17/14; s/p radiation, anti-estrogen oral therapy), vertigo, right THA (617/19). BMI is consistent with obesity.   Cardiologist Dr. Terrence Dupont has cleared patient for surgery from a cardiac standpoint.  Negative stress test in 2019.  Moderate aortic stenosis on last echocardiogram.  Recommendations include avoid hypotension.  Okay to stop aspirin 5 days prior to surgery. (Last aspirin 12/21/2019.)  Presurgical COVID-19 test is scheduled for 12/24/2019.  Anesthesia team to evaluate on the day of surgery.   VS: BP 131/60   Pulse 69   Temp 36.9 C (Oral)   Resp 17   Ht 5\' 5"  (1.651 m)   Wt 97 kg   SpO2 100%   BMI 35.58 kg/m    PROVIDERS: Fanny Bien, MD is PCP - Charolette Forward, MD is cardiologist ([facility]). Last visit 11/06/19. No exertional chest pain.  55-month follow-up planned. Nicholas Lose, MD is HEM-ONC. Last evaluation 06/22/19 with one year follow-up recommended.    LABS: Labs reviewed: Acceptable for surgery. (all labs ordered are listed, but only abnormal results are displayed)  Labs Reviewed  SURGICAL PCR SCREEN  CBC  COMPREHENSIVE METABOLIC PANEL    IMAGES: CXR 12/19/19: FINDINGS: The heart size and mediastinal contours are within normal limits. Both lungs are clear. The visualized skeletal structures  are unremarkable. IMPRESSION: No active cardiopulmonary disease.   EKG: 12/19/19: NSR   CV:  Echo 08/31/19 ([facility]): Summary: Left ventricle: LV size is normal.  LV systolic function is normal.  EF estimated at 55%.[listed as 50-55% and 29-51%]  Stage I diastolic dysfunction.  Mild concentric LVH.  Overall wall motion normal.   Right ventricle: RV size is normal.  RV function is normal.  No RV hypertrophy present.  No abnormality visualized in the RV.  Ventricular septum: IVS is normal. Left atrium: LA size is normal. Right Atrium: RA size is normal. Atrial septum: Atrial septum is normal. Aorta: Aorta is normal. Pericardial effusion: Pericardium is normal. Aortic valve: Moderate thickening of AV leaflets.  Moderate calcification of AV leaflets.  Moderate AV stenosis. AVA of approximately 1.0 cm. Mitral valve: Mild calcification of MV leaflets.  Trace mitral regurgitation noted. Pulmonic valve: No PV abnormalities noted. Tricuspid valve: Trace tricuspid regurgitation noted. Measurements: LA Dimen: 2.8 cm. LVOT DImen: 1.9 cm Ventricular Septum Dias Thick 1.3 cm LVPW Dias Thick 1.2 cm Ratios IVS/LVPW 1.083 LV Dias Dimen 3 cm; Sys Dimen 2.2 cm; Frac Short 26.667%; EF 55% (Comparison echo 08/01/13: LVEF 64%. Mild to moderate AS. AV mean gradient of 12.7 mmHg. Calculated AVA 0.89 cm2. Mild to moderate MR. Mild TR. No pulmonary HTN.)    Nuclear stress test 10/24/17: IMPRESSION: 1. No reversible ischemia or infarction. 2. Normal left ventricular wall motion. 3. Left ventricular ejection fraction 72% 4. Non invasive risk stratification*: Low   LHC 08/07/13/PCI 08/17/13: Coronary angiography had revealed CTO of a  large RCA faintly collateralized to the left system, and very complex high-grade stenosis of the proximal and distal circumflex coronary artery, midsegment at OM1 also revealing about 70-80% stenosis with involvement of large OM1 ostium about 80%, OM 2  which is small with ostial 50-60% stenosis. Procedure performed: 08/17/2013:  - Stenting of Proximal and distal circumflex with 4 DES stents. 3.0x12, 3.0x16 promus Premier drug-eluting stents sandwiched in proximal circumflex, mid 2.5x18 Xience Alpine DES and distal 2.5x16 mm promus Premier DES. - Stenting of CTO  RCA. Mid to distal RCA 3.0x38 and proximal 3.0x28 mm promus Premier DES.  [place] 08/07/13: RA pressure 11/7  Mean 6 mm mercury.   RV pressure 33/3 and Right ventricular EDP 6 mm Hg. PA pressure 32/12 with a mean of 21  mm mercury. PA saturation 67%.  Pulmonary capillary wedge 16/11 with a mean of 12 mm Hg. Aortic saturation 94%.  Cardiac output was 4.92 with cardiac index of 2.37  by Fick.    Past Medical History:  Diagnosis Date  . Anemia    in younger years  . Anginal pain ([place])   . Aortic stenosis    mild to moderate AS 07/2013  . Arthritis    "hands; legs" (08/17/2013)  . CAP (community acquired pneumonia)   . Coronary artery disease    6 stents in 2015  . GERD (gastroesophageal reflux disease)   . H/O hiatal hernia   . Heart murmur   . High cholesterol   . Hx of adenomatous colonic polyps 08/15/2018  . Hypertension   . Pneumonia 1970's   "once"  . Radiation 11/28/14-01/02/15   Right breast 50 Gray  . Vertigo     Past Surgical History:  Procedure Laterality Date  . ABDOMINAL HYSTERECTOMY  1985  . APPENDECTOMY  1960  . BREAST LUMPECTOMY WITH NEEDLE LOCALIZATION AND AXILLARY SENTINEL LYMPH NODE BX Right 10/17/2014   Procedure: BREAST LUMPECTOMY WITH NEEDLE LOCALIZATION AND AXILLARY SENTINEL LYMPH NODE BX;  Surgeon: Jackolyn Confer, MD;  Location: [place];  Service: General;  Laterality: Right;  . CARDIAC CATHETERIZATION  08/07/2013  . COLONOSCOPY    . CORONARY ANGIOPLASTY WITH STENT PLACEMENT  08/17/2013   "6 stents"(08/17/2013)  . LEFT AND RIGHT HEART CATHETERIZATION WITH CORONARY ANGIOGRAM N/A 08/07/2013   Procedure: LEFT AND RIGHT HEART CATHETERIZATION WITH CORONARY  ANGIOGRAM;  Surgeon: Laverda Page, MD;  Location: [facility] CATH LAB;  Service: Cardiovascular;  Laterality: N/A;  . PERCUTANEOUS CORONARY STENT INTERVENTION (PCI-S) N/A 08/17/2013   Procedure: PERCUTANEOUS CORONARY STENT INTERVENTION (PCI-S);  Surgeon: Laverda Page, MD;  Location: [facility] CATH LAB;  Service: Cardiovascular;  Laterality: N/A;  . TOTAL HIP ARTHROPLASTY Right 12/19/2017   Procedure: RIGHT TOTAL HIP ARTHROPLASTY ANTERIOR APPROACH;  Surgeon: Marybelle Killings, MD;  Location: [place];  Service: Orthopedics;  Laterality: Right;  . TUBAL LIGATION  1980    MEDICATIONS: . acetaminophen (TYLENOL) 325 MG tablet  . alendronate (FOSAMAX) 70 MG tablet  . amLODipine (NORVASC) 2.5 MG tablet  . anastrozole (ARIMIDEX) 1 MG tablet  . aspirin 81 MG EC tablet  . Cholecalciferol (VITAMIN D3) 400 units CAPS  . CRESTOR 20 MG tablet  . isosorbide mononitrate (IMDUR) 60 MG 24 hr tablet  . metoprolol succinate (TOPROL-XL) 50 MG 24 hr tablet  . Multiple Minerals-Vitamins (CALCIUM & VIT D3 BONE HEALTH PO)  . nitroGLYCERIN (NITROSTAT) 0.4 MG SL tablet  . omeprazole (PRILOSEC) 40 MG capsule  . spironolactone (ALDACTONE) 25 MG tablet  . traMADol (ULTRAM) 50 MG  tablet   No current facility-administered medications for this encounter.    Myra Gianotti, PA-C Surgical Short Stay/Anesthesiology [facility] Phone [phone] [facility] Phone [phone] 12/20/2019 5:00 PM

## 2019-12-20 NOTE — Anesthesia Preprocedure Evaluation (Addendum)
Anesthesia Evaluation  Patient identified by MRN, date of birth, ID band Patient awake    Reviewed: Allergy & Precautions, NPO status , Patient's Chart, lab work & pertinent test results, reviewed documented beta blocker date and time   Airway Mallampati: II  TM Distance: >3 FB Neck ROM: Full    Dental  (+) Dental Advisory Given, Partial Upper,    Pulmonary former smoker,    Pulmonary exam normal breath sounds clear to auscultation       Cardiovascular hypertension, Pt. on medications and Pt. on home beta blockers + CAD and + Cardiac Stents  + Valvular Problems/Murmurs AS  Rhythm:Regular Rate:Normal + Systolic murmurs Echo 5/45/62 ([facility]): Summary: Left ventricle: LV size is normal.  LV systolic function is normal.  EF estimated at 55%.(listed as 50-55% and 55-60%)  Stage I diastolic dysfunction.  Mild concentric LVH.  Overall wall motion normal.   Right ventricle: RV size is normal.  RV function is normal.  No RV hypertrophy present.  No abnormality visualized in the RV.  Ventricular septum: IVS is normal. Left atrium: LA size is normal. Right Atrium: RA size is normal. Atrial septum: Atrial septum is normal. Aorta: Aorta is normal. Pericardial effusion: Pericardium is normal. Aortic valve: Moderate thickening of AV leaflets.  Moderate calcification of AV leaflets.  Moderate AV stenosis. AVA of approximately 1.0 cm. Mitral valve: Mild calcification of MV leaflets.  Trace mitral regurgitation noted. Pulmonic valve: No PV abnormalities noted. Tricuspid valve: Trace tricuspid regurgitation noted.   Neuro/Psych negative neurological ROS  negative psych ROS   GI/Hepatic Neg liver ROS, hiatal hernia, GERD  Medicated,  Endo/Other  Obesity   Renal/GU negative Renal ROS     Musculoskeletal  (+) Arthritis ,   Abdominal   Peds  Hematology negative hematology ROS (+) Plt 176k   Anesthesia Other  Findings Day of surgery medications reviewed with the patient.  Reproductive/Obstetrics                            Anesthesia Physical Anesthesia Plan  ASA: III  Anesthesia Plan: General   Post-op Pain Management:    Induction: Intravenous  PONV Risk Score and Plan: 3 and Ondansetron and Dexamethasone  Airway Management Planned: Oral ETT  Additional Equipment:   Intra-op Plan:   Post-operative Plan: Extubation in OR  Informed Consent: I have reviewed the patients History and Physical, chart, labs and discussed the procedure including the risks, benefits and alternatives for the proposed anesthesia with the patient or authorized representative who has indicated his/her understanding and acceptance.     Dental advisory given  Plan Discussed with: CRNA  Anesthesia Plan Comments: (PAT note written 12/20/2019 by Myra Gianotti, PA-C. )       Anesthesia Quick Evaluation

## 2019-12-24 ENCOUNTER — Other Ambulatory Visit (HOSPITAL_COMMUNITY)
Admission: RE | Admit: 2019-12-24 | Discharge: 2019-12-24 | Disposition: A | Discharge status: Home / Self Care | Payer: Medicare HMO | Source: Ambulatory Visit | Attending: Orthopaedic Surgery | Admitting: Orthopaedic Surgery

## 2019-12-24 DIAGNOSIS — Z01812 Encounter for preprocedural laboratory examination: Secondary | ICD-10-CM | POA: Insufficient documentation

## 2019-12-24 DIAGNOSIS — Z20822 Contact with and (suspected) exposure to covid-19: Secondary | ICD-10-CM | POA: Insufficient documentation

## 2019-12-24 LAB — SARS CORONAVIRUS 2 (TAT 6-24 HRS): SARS Coronavirus 2: NEGATIVE

## 2019-12-25 MED ORDER — BUPIVACAINE LIPOSOME 1.3 % IJ SUSP
20.0000 mL | Freq: Once | INTRAMUSCULAR | Status: DC
  Filled 2019-12-25: qty 20

## 2019-12-26 ENCOUNTER — Other Ambulatory Visit: Payer: Self-pay

## 2019-12-26 ENCOUNTER — Encounter (HOSPITAL_COMMUNITY)
Admission: AD | Disposition: A | Discharge status: Home / Self Care | Payer: Self-pay | Source: Ambulatory Visit | Attending: Orthopaedic Surgery

## 2019-12-26 ENCOUNTER — Inpatient Hospital Stay (HOSPITAL_COMMUNITY)
Admission: AD | Admit: 2019-12-26 | Discharge: 2019-12-31 | DRG: 470 | Disposition: A | Discharge status: Home Health | Payer: Medicare HMO | Source: Ambulatory Visit | Attending: Orthopaedic Surgery | Admitting: Orthopaedic Surgery

## 2019-12-26 ENCOUNTER — Ambulatory Visit (HOSPITAL_COMMUNITY): Payer: Medicare HMO | Admitting: Anesthesiology

## 2019-12-26 ENCOUNTER — Ambulatory Visit (HOSPITAL_COMMUNITY): Payer: Medicare HMO | Admitting: Vascular Surgery

## 2019-12-26 ENCOUNTER — Ambulatory Visit (HOSPITAL_COMMUNITY): Payer: Medicare HMO

## 2019-12-26 ENCOUNTER — Encounter (HOSPITAL_COMMUNITY): Payer: Self-pay | Admitting: Orthopaedic Surgery

## 2019-12-26 DIAGNOSIS — M25752 Osteophyte, left hip: Secondary | ICD-10-CM | POA: Diagnosis not present

## 2019-12-26 DIAGNOSIS — Z7982 Long term (current) use of aspirin: Secondary | ICD-10-CM

## 2019-12-26 DIAGNOSIS — Z8249 Family history of ischemic heart disease and other diseases of the circulatory system: Secondary | ICD-10-CM

## 2019-12-26 DIAGNOSIS — E669 Obesity, unspecified: Secondary | ICD-10-CM | POA: Diagnosis present

## 2019-12-26 DIAGNOSIS — Z9181 History of falling: Secondary | ICD-10-CM

## 2019-12-26 DIAGNOSIS — Z96641 Presence of right artificial hip joint: Secondary | ICD-10-CM | POA: Diagnosis present

## 2019-12-26 DIAGNOSIS — I2510 Atherosclerotic heart disease of native coronary artery without angina pectoris: Secondary | ICD-10-CM | POA: Diagnosis present

## 2019-12-26 DIAGNOSIS — M85662 Other cyst of bone, left lower leg: Secondary | ICD-10-CM | POA: Diagnosis present

## 2019-12-26 DIAGNOSIS — Z955 Presence of coronary angioplasty implant and graft: Secondary | ICD-10-CM

## 2019-12-26 DIAGNOSIS — M1612 Unilateral primary osteoarthritis, left hip: Secondary | ICD-10-CM | POA: Diagnosis not present

## 2019-12-26 DIAGNOSIS — Z880 Allergy status to penicillin: Secondary | ICD-10-CM

## 2019-12-26 DIAGNOSIS — Z87891 Personal history of nicotine dependence: Secondary | ICD-10-CM

## 2019-12-26 DIAGNOSIS — Z96642 Presence of left artificial hip joint: Secondary | ICD-10-CM | POA: Diagnosis not present

## 2019-12-26 DIAGNOSIS — Z20822 Contact with and (suspected) exposure to covid-19: Secondary | ICD-10-CM | POA: Diagnosis present

## 2019-12-26 DIAGNOSIS — K219 Gastro-esophageal reflux disease without esophagitis: Secondary | ICD-10-CM | POA: Diagnosis not present

## 2019-12-26 DIAGNOSIS — Z853 Personal history of malignant neoplasm of breast: Secondary | ICD-10-CM

## 2019-12-26 DIAGNOSIS — Z96649 Presence of unspecified artificial hip joint: Secondary | ICD-10-CM

## 2019-12-26 DIAGNOSIS — I10 Essential (primary) hypertension: Secondary | ICD-10-CM | POA: Diagnosis not present

## 2019-12-26 DIAGNOSIS — Z419 Encounter for procedure for purposes other than remedying health state, unspecified: Secondary | ICD-10-CM

## 2019-12-26 DIAGNOSIS — E7800 Pure hypercholesterolemia, unspecified: Secondary | ICD-10-CM | POA: Diagnosis not present

## 2019-12-26 DIAGNOSIS — I350 Nonrheumatic aortic (valve) stenosis: Secondary | ICD-10-CM | POA: Diagnosis not present

## 2019-12-26 DIAGNOSIS — Z6835 Body mass index (BMI) 35.0-35.9, adult: Secondary | ICD-10-CM

## 2019-12-26 DIAGNOSIS — Z471 Aftercare following joint replacement surgery: Secondary | ICD-10-CM | POA: Diagnosis not present

## 2019-12-26 DIAGNOSIS — M160 Bilateral primary osteoarthritis of hip: Secondary | ICD-10-CM | POA: Diagnosis not present

## 2019-12-26 DIAGNOSIS — Z8701 Personal history of pneumonia (recurrent): Secondary | ICD-10-CM

## 2019-12-26 DIAGNOSIS — Z79899 Other long term (current) drug therapy: Secondary | ICD-10-CM

## 2019-12-26 HISTORY — PX: TOTAL HIP ARTHROPLASTY: SHX124

## 2019-12-26 SURGERY — ARTHROPLASTY, HIP, TOTAL, ANTERIOR APPROACH
Anesthesia: General | Site: Hip | Laterality: Left

## 2019-12-26 MED ORDER — KETOROLAC TROMETHAMINE 30 MG/ML IJ SOLN
30.0000 mg | Freq: Once | INTRAMUSCULAR | Status: AC
  Administered 2019-12-26: 30 mg via INTRAVENOUS

## 2019-12-26 MED ORDER — METOPROLOL SUCCINATE ER 50 MG PO TB24
50.0000 mg | ORAL_TABLET | Freq: Every day | ORAL | Status: DC
  Administered 2019-12-27 – 2019-12-31 (×4): 50 mg via ORAL
  Filled 2019-12-26 (×5): qty 1

## 2019-12-26 MED ORDER — HYDROMORPHONE HCL 1 MG/ML IJ SOLN
INTRAMUSCULAR | Status: AC
  Filled 2019-12-26: qty 0.5

## 2019-12-26 MED ORDER — VANCOMYCIN HCL IN DEXTROSE 1-5 GM/200ML-% IV SOLN
1000.0000 mg | INTRAVENOUS | Status: AC
  Administered 2019-12-26: 1000 mg via INTRAVENOUS
  Filled 2019-12-26: qty 200

## 2019-12-26 MED ORDER — MENTHOL 3 MG MT LOZG: 1.0000 | LOZENGE | OROMUCOSAL | Status: DC | PRN

## 2019-12-26 MED ORDER — CALCIUM CARBONATE-VITAMIN D 500-200 MG-UNIT PO TABS
1.0000 | ORAL_TABLET | ORAL | Status: DC
  Administered 2019-12-27 – 2019-12-31 (×2): 1 via ORAL
  Filled 2019-12-26: qty 1

## 2019-12-26 MED ORDER — ONDANSETRON HCL 4 MG/2ML IJ SOLN
INTRAMUSCULAR | Status: AC
  Filled 2019-12-26: qty 2

## 2019-12-26 MED ORDER — PHENOL 1.4 % MT LIQD
1.0000 | OROMUCOSAL | Status: DC | PRN
  Administered 2019-12-26: 1 via OROMUCOSAL
  Filled 2019-12-26: qty 177

## 2019-12-26 MED ORDER — CHLORHEXIDINE GLUCONATE 0.12 % MT SOLN
OROMUCOSAL | Status: AC
  Administered 2019-12-26: 15 mL via OROMUCOSAL
  Filled 2019-12-26: qty 15

## 2019-12-26 MED ORDER — OXYCODONE HCL 5 MG PO TABS
10.0000 mg | ORAL_TABLET | ORAL | Status: DC | PRN
  Administered 2019-12-27: 15 mg via ORAL
  Administered 2019-12-27: 10 mg via ORAL
  Filled 2019-12-26 (×2): qty 3

## 2019-12-26 MED ORDER — LACTATED RINGERS IV SOLN: INTRAVENOUS | Status: DC

## 2019-12-26 MED ORDER — LIDOCAINE 2% (20 MG/ML) 5 ML SYRINGE
INTRAMUSCULAR | Status: DC | PRN
  Administered 2019-12-26: 80 mg via INTRAVENOUS

## 2019-12-26 MED ORDER — BUPIVACAINE HCL (PF) 0.25 % IJ SOLN
INTRAMUSCULAR | Status: DC | PRN
  Administered 2019-12-26: 20 mL

## 2019-12-26 MED ORDER — CALCIUM & VIT D3 BONE HEALTH PO LIQD: ORAL | Status: DC

## 2019-12-26 MED ORDER — FENTANYL CITRATE (PF) 100 MCG/2ML IJ SOLN
INTRAMUSCULAR | Status: DC | PRN
  Administered 2019-12-26: 50 ug via INTRAVENOUS
  Administered 2019-12-26: 100 ug via INTRAVENOUS
  Administered 2019-12-26: 75 ug via INTRAVENOUS
  Administered 2019-12-26: 25 ug via INTRAVENOUS

## 2019-12-26 MED ORDER — HYDROMORPHONE HCL 1 MG/ML IJ SOLN
INTRAMUSCULAR | Status: DC | PRN
  Administered 2019-12-26: .5 mg via INTRAVENOUS

## 2019-12-26 MED ORDER — NAPROXEN 250 MG PO TABS
250.0000 mg | ORAL_TABLET | Freq: Two times a day (BID) | ORAL | Status: DC
  Administered 2019-12-27 – 2019-12-31 (×9): 250 mg via ORAL
  Filled 2019-12-26 (×9): qty 1

## 2019-12-26 MED ORDER — TRANEXAMIC ACID-NACL 1000-0.7 MG/100ML-% IV SOLN
INTRAVENOUS | Status: DC | PRN
  Administered 2019-12-26: 1000 mg via INTRAVENOUS

## 2019-12-26 MED ORDER — SUGAMMADEX SODIUM 200 MG/2ML IV SOLN
INTRAVENOUS | Status: DC | PRN
  Administered 2019-12-26: 200 mg via INTRAVENOUS

## 2019-12-26 MED ORDER — ONDANSETRON HCL 4 MG/2ML IJ SOLN
4.0000 mg | Freq: Four times a day (QID) | INTRAMUSCULAR | Status: DC | PRN
  Administered 2019-12-26 – 2019-12-28 (×2): 4 mg via INTRAVENOUS
  Filled 2019-12-26 (×2): qty 2

## 2019-12-26 MED ORDER — PHENYLEPHRINE HCL (PRESSORS) 10 MG/ML IV SOLN
INTRAVENOUS | Status: DC | PRN
  Administered 2019-12-26: 120 ug via INTRAVENOUS

## 2019-12-26 MED ORDER — FERROUS SULFATE 325 (65 FE) MG PO TABS
325.0000 mg | ORAL_TABLET | Freq: Three times a day (TID) | ORAL | Status: DC
  Administered 2019-12-27 – 2019-12-31 (×14): 325 mg via ORAL
  Filled 2019-12-26 (×14): qty 1

## 2019-12-26 MED ORDER — PROPOFOL 10 MG/ML IV BOLUS
INTRAVENOUS | Status: AC
  Filled 2019-12-26: qty 20

## 2019-12-26 MED ORDER — ROSUVASTATIN CALCIUM 20 MG PO TABS
20.0000 mg | ORAL_TABLET | Freq: Every day | ORAL | Status: DC
  Administered 2019-12-27 – 2019-12-31 (×5): 20 mg via ORAL
  Filled 2019-12-26 (×5): qty 1

## 2019-12-26 MED ORDER — SPIRONOLACTONE 25 MG PO TABS
25.0000 mg | ORAL_TABLET | Freq: Every day | ORAL | Status: DC
  Administered 2019-12-27 – 2019-12-31 (×5): 25 mg via ORAL
  Filled 2019-12-26 (×5): qty 1

## 2019-12-26 MED ORDER — SODIUM CHLORIDE 0.45 % IV SOLN: INTRAVENOUS | Status: DC

## 2019-12-26 MED ORDER — PANTOPRAZOLE SODIUM 40 MG PO TBEC
80.0000 mg | DELAYED_RELEASE_TABLET | Freq: Every day | ORAL | Status: DC
  Administered 2019-12-27 – 2019-12-31 (×5): 80 mg via ORAL
  Filled 2019-12-26 (×5): qty 2

## 2019-12-26 MED ORDER — 0.9 % SODIUM CHLORIDE (POUR BTL) OPTIME
TOPICAL | Status: DC | PRN
  Administered 2019-12-26: 1000 mL

## 2019-12-26 MED ORDER — EPINEPHRINE PF 1 MG/ML IJ SOLN
INTRAMUSCULAR | Status: AC
  Filled 2019-12-26: qty 1

## 2019-12-26 MED ORDER — TRANEXAMIC ACID 1000 MG/10ML IV SOLN: INTRAVENOUS | Status: DC | PRN

## 2019-12-26 MED ORDER — OXYCODONE HCL 5 MG PO TABS
5.0000 mg | ORAL_TABLET | ORAL | Status: DC | PRN
  Administered 2019-12-26: 10 mg via ORAL
  Filled 2019-12-26: qty 2

## 2019-12-26 MED ORDER — METOCLOPRAMIDE HCL 5 MG PO TABS: 5.0000 mg | ORAL_TABLET | Freq: Three times a day (TID) | ORAL | Status: DC | PRN

## 2019-12-26 MED ORDER — DOCUSATE SODIUM 100 MG PO CAPS
100.0000 mg | ORAL_CAPSULE | Freq: Two times a day (BID) | ORAL | Status: DC
  Administered 2019-12-26 – 2019-12-31 (×7): 100 mg via ORAL
  Filled 2019-12-26 (×9): qty 1

## 2019-12-26 MED ORDER — KETOROLAC TROMETHAMINE 30 MG/ML IJ SOLN
INTRAMUSCULAR | Status: AC
  Filled 2019-12-26: qty 1

## 2019-12-26 MED ORDER — ISOSORBIDE MONONITRATE ER 60 MG PO TB24
60.0000 mg | ORAL_TABLET | Freq: Every day | ORAL | Status: DC
  Administered 2019-12-27 – 2019-12-31 (×4): 60 mg via ORAL
  Filled 2019-12-26 (×5): qty 1

## 2019-12-26 MED ORDER — TRANEXAMIC ACID-NACL 1000-0.7 MG/100ML-% IV SOLN
INTRAVENOUS | Status: AC
  Filled 2019-12-26: qty 100

## 2019-12-26 MED ORDER — ACETAMINOPHEN 500 MG PO TABS
1000.0000 mg | ORAL_TABLET | Freq: Once | ORAL | Status: AC
  Administered 2019-12-26: 1000 mg via ORAL
  Filled 2019-12-26: qty 2

## 2019-12-26 MED ORDER — ONDANSETRON HCL 4 MG PO TABS
4.0000 mg | ORAL_TABLET | Freq: Four times a day (QID) | ORAL | Status: DC | PRN
  Administered 2019-12-27 – 2019-12-30 (×2): 4 mg via ORAL
  Filled 2019-12-26 (×2): qty 1

## 2019-12-26 MED ORDER — CHLORHEXIDINE GLUCONATE 0.12 % MT SOLN: 15.0000 mL | Freq: Once | OROMUCOSAL | Status: AC

## 2019-12-26 MED ORDER — PROPOFOL 10 MG/ML IV BOLUS
INTRAVENOUS | Status: DC | PRN
  Administered 2019-12-26: 130 mg via INTRAVENOUS

## 2019-12-26 MED ORDER — AMLODIPINE BESYLATE 2.5 MG PO TABS
2.5000 mg | ORAL_TABLET | Freq: Every day | ORAL | Status: DC
  Administered 2019-12-27 – 2019-12-31 (×3): 2.5 mg via ORAL
  Filled 2019-12-26 (×5): qty 1

## 2019-12-26 MED ORDER — BUPIVACAINE HCL (PF) 0.25 % IJ SOLN
INTRAMUSCULAR | Status: AC
  Filled 2019-12-26: qty 30

## 2019-12-26 MED ORDER — SENNOSIDES-DOCUSATE SODIUM 8.6-50 MG PO TABS: 1.0000 | ORAL_TABLET | Freq: Every evening | ORAL | Status: DC | PRN

## 2019-12-26 MED ORDER — ONDANSETRON HCL 4 MG/2ML IJ SOLN
INTRAMUSCULAR | Status: DC | PRN
  Administered 2019-12-26: 4 mg via INTRAVENOUS

## 2019-12-26 MED ORDER — BUPIVACAINE LIPOSOME 1.3 % IJ SUSP
INTRAMUSCULAR | Status: DC | PRN
  Administered 2019-12-26: 20 mL

## 2019-12-26 MED ORDER — BUPIVACAINE HCL (PF) 0.5 % IJ SOLN
INTRAMUSCULAR | Status: AC
  Filled 2019-12-26: qty 30

## 2019-12-26 MED ORDER — CHOLECALCIFEROL 10 MCG (400 UNIT) PO TABS
400.0000 [IU] | ORAL_TABLET | Freq: Every day | ORAL | Status: DC
  Administered 2019-12-27 – 2019-12-30 (×4): 400 [IU] via ORAL
  Filled 2019-12-26 (×6): qty 1

## 2019-12-26 MED ORDER — FENTANYL CITRATE (PF) 250 MCG/5ML IJ SOLN
INTRAMUSCULAR | Status: AC
  Filled 2019-12-26: qty 5

## 2019-12-26 MED ORDER — ANASTROZOLE 1 MG PO TABS
1.0000 mg | ORAL_TABLET | Freq: Every day | ORAL | Status: DC
  Administered 2019-12-27 – 2019-12-31 (×5): 1 mg via ORAL
  Filled 2019-12-26 (×5): qty 1

## 2019-12-26 MED ORDER — ROCURONIUM BROMIDE 10 MG/ML (PF) SYRINGE
PREFILLED_SYRINGE | INTRAVENOUS | Status: DC | PRN
  Administered 2019-12-26: 55 mg via INTRAVENOUS
  Administered 2019-12-26: 25 mg via INTRAVENOUS

## 2019-12-26 MED ORDER — DIPHENHYDRAMINE HCL 12.5 MG/5ML PO ELIX: 12.5000 mg | ORAL_SOLUTION | ORAL | Status: DC | PRN

## 2019-12-26 MED ORDER — ONDANSETRON HCL 4 MG/2ML IJ SOLN
4.0000 mg | Freq: Once | INTRAMUSCULAR | Status: AC | PRN
  Administered 2019-12-26: 4 mg via INTRAVENOUS

## 2019-12-26 MED ORDER — HYDROMORPHONE HCL 1 MG/ML IJ SOLN
0.5000 mg | INTRAMUSCULAR | Status: DC | PRN
  Administered 2019-12-28: 1 mg via INTRAVENOUS
  Filled 2019-12-26: qty 1

## 2019-12-26 MED ORDER — PHENYLEPHRINE HCL-NACL 10-0.9 MG/250ML-% IV SOLN
INTRAVENOUS | Status: DC | PRN
Start: 2019-12-26 — End: 2019-12-26
  Administered 2019-12-26: 25 ug/min via INTRAVENOUS

## 2019-12-26 MED ORDER — ACETAMINOPHEN 325 MG PO TABS
325.0000 mg | ORAL_TABLET | Freq: Four times a day (QID) | ORAL | Status: DC | PRN
  Administered 2019-12-27 – 2019-12-30 (×2): 650 mg via ORAL
  Filled 2019-12-26 (×2): qty 2

## 2019-12-26 MED ORDER — FENTANYL CITRATE (PF) 100 MCG/2ML IJ SOLN: 25.0000 ug | INTRAMUSCULAR | Status: DC | PRN

## 2019-12-26 MED ORDER — NITROGLYCERIN 0.4 MG SL SUBL: 0.4000 mg | SUBLINGUAL_TABLET | SUBLINGUAL | Status: DC | PRN

## 2019-12-26 MED ORDER — ORAL CARE MOUTH RINSE: 15.0000 mL | Freq: Once | OROMUCOSAL | Status: AC

## 2019-12-26 MED ORDER — METOCLOPRAMIDE HCL 5 MG/ML IJ SOLN: 5.0000 mg | Freq: Three times a day (TID) | INTRAMUSCULAR | Status: DC | PRN

## 2019-12-26 MED ORDER — DEXMEDETOMIDINE HCL 200 MCG/2ML IV SOLN
INTRAVENOUS | Status: DC | PRN
  Administered 2019-12-26: 12 ug via INTRAVENOUS

## 2019-12-26 SURGICAL SUPPLY — 56 items
BENZOIN TINCTURE PRP APPL 2/3 (GAUZE/BANDAGES/DRESSINGS) ×2 IMPLANT
BLADE CLIPPER SURG (BLADE) IMPLANT
BLADE SAW SGTL 18X1.27X75 (BLADE) ×2 IMPLANT
CELLS DAT CNTRL 66122 CELL SVR (MISCELLANEOUS) ×1 IMPLANT
COVER SURGICAL LIGHT HANDLE (MISCELLANEOUS) ×2 IMPLANT
COVER WAND RF STERILE (DRAPES) IMPLANT
DRAPE C-ARM 42X72 X-RAY (DRAPES) ×2 IMPLANT
DRAPE IMP U-DRAPE 54X76 (DRAPES) ×2 IMPLANT
DRAPE STERI IOBAN 125X83 (DRAPES) ×2 IMPLANT
DRAPE U-SHAPE 47X51 STRL (DRAPES) ×6 IMPLANT
DRSG MEPILEX BORDER 4X12 (GAUZE/BANDAGES/DRESSINGS) ×2 IMPLANT
DRSG MEPILEX BORDER 4X8 (GAUZE/BANDAGES/DRESSINGS) IMPLANT
DURAPREP 26ML APPLICATOR (WOUND CARE) ×2 IMPLANT
ELECT BLADE 4.0 EZ CLEAN MEGAD (MISCELLANEOUS) ×2
ELECT CAUTERY BLADE 6.4 (BLADE) ×2 IMPLANT
ELECT REM PT RETURN 9FT ADLT (ELECTROSURGICAL) ×2
ELECTRODE BLDE 4.0 EZ CLN MEGD (MISCELLANEOUS) ×1 IMPLANT
ELECTRODE REM PT RTRN 9FT ADLT (ELECTROSURGICAL) ×1 IMPLANT
ELIMINATOR HOLE APEX DEPUY (Hips) ×2 IMPLANT
FACESHIELD WRAPAROUND (MASK) ×2 IMPLANT
GLOVE BIOGEL PI IND STRL 8 (GLOVE) ×2 IMPLANT
GLOVE BIOGEL PI INDICATOR 8 (GLOVE) ×2
GLOVE ORTHO TXT STRL SZ7.5 (GLOVE) ×4 IMPLANT
GOWN STRL REUS W/ TWL LRG LVL3 (GOWN DISPOSABLE) ×1 IMPLANT
GOWN STRL REUS W/ TWL XL LVL3 (GOWN DISPOSABLE) ×1 IMPLANT
GOWN STRL REUS W/TWL 2XL LVL3 (GOWN DISPOSABLE) ×2 IMPLANT
GOWN STRL REUS W/TWL LRG LVL3 (GOWN DISPOSABLE) ×2
GOWN STRL REUS W/TWL XL LVL3 (GOWN DISPOSABLE) ×2
HEAD M SROM 36MM PLUS 1.5 (Hips) ×1 IMPLANT
KIT BASIN OR (CUSTOM PROCEDURE TRAY) ×2 IMPLANT
KIT TURNOVER KIT B (KITS) ×2 IMPLANT
LINER NEUTRAL 52X36MM PLUS 4 (Liner) ×2 IMPLANT
MANIFOLD NEPTUNE II (INSTRUMENTS) ×2 IMPLANT
NEEDLE 18GX1X1/2 (RX/OR ONLY) (NEEDLE) ×2 IMPLANT
NS IRRIG 1000ML POUR BTL (IV SOLUTION) ×2 IMPLANT
PACK TOTAL JOINT (CUSTOM PROCEDURE TRAY) ×2 IMPLANT
PAD ARMBOARD 7.5X6 YLW CONV (MISCELLANEOUS) ×4 IMPLANT
PIN SECTOR W/GRIP ACE CUP 52MM (Hips) ×2 IMPLANT
RTRCTR WOUND ALEXIS 18CM MED (MISCELLANEOUS) ×2
SROM M HEAD 36MM PLUS 1.5 (Hips) ×2 IMPLANT
STAPLER VISISTAT 35W (STAPLE) ×2 IMPLANT
STEM FEMORAL SZ6 HIGH ACTIS (Stem) ×2 IMPLANT
STRIP CLOSURE SKIN 1/2X4 (GAUZE/BANDAGES/DRESSINGS) IMPLANT
SUT VIC AB 0 CT1 27 (SUTURE) ×2
SUT VIC AB 0 CT1 27XBRD ANBCTR (SUTURE) ×1 IMPLANT
SUT VIC AB 2-0 CT1 27 (SUTURE) ×2
SUT VIC AB 2-0 CT1 TAPERPNT 27 (SUTURE) ×1 IMPLANT
SUT VICRYL 4-0 PS2 18IN ABS (SUTURE) ×2 IMPLANT
SUT VLOC 180 0 24IN GS25 (SUTURE) ×2 IMPLANT
SYR 20ML ECCENTRIC (SYRINGE) ×2 IMPLANT
SYR CONTROL 10ML LL (SYRINGE) ×2 IMPLANT
TOWEL GREEN STERILE (TOWEL DISPOSABLE) ×4 IMPLANT
TOWEL GREEN STERILE FF (TOWEL DISPOSABLE) ×2 IMPLANT
TRAY CATH 16FR W/PLASTIC CATH (SET/KITS/TRAYS/PACK) IMPLANT
TRAY FOLEY MTR SLVR 16FR STAT (SET/KITS/TRAYS/PACK) ×2 IMPLANT
WATER STERILE IRR 1000ML POUR (IV SOLUTION) ×4 IMPLANT

## 2019-12-26 NOTE — Anesthesia Procedure Notes (Signed)
Procedure Name: Intubation Date/Time: 12/26/2019 12:39 PM Performed by: Babs Bertin, CRNA Pre-anesthesia Checklist: Patient identified, Emergency Drugs available, Suction available and Patient being monitored Patient Re-evaluated:Patient Re-evaluated prior to induction Oxygen Delivery Method: Circle System Utilized Preoxygenation: Pre-oxygenation with 100% oxygen Induction Type: IV induction Ventilation: Mask ventilation without difficulty Laryngoscope Size: Mac and 3 Grade View: Grade I Tube type: Oral Tube size: 7.0 mm Number of attempts: 1 Airway Equipment and Method: Stylet and Oral airway Placement Confirmation: ETT inserted through vocal cords under direct vision,  positive ETCO2 and breath sounds checked- equal and bilateral Secured at: 22 cm Tube secured with: Tape Dental Injury: Teeth and Oropharynx as per pre-operative assessment

## 2019-12-26 NOTE — Transfer of Care (Signed)
Immediate Anesthesia Transfer of Care Note  Patient: Carolyn Rowland  Procedure(s) Performed: TOTAL HIP ARTHROPLASTY-DIRECT ANTERIOR (Left Hip)  Patient Location: PACU  Anesthesia Type:General  Level of Consciousness: awake, alert  and oriented  Airway & Oxygen Therapy: Patient Spontanous Breathing and Patient connected to nasal cannula oxygen  Post-op Assessment: Report given to RN and Post -op Vital signs reviewed and stable  Post vital signs: Reviewed and stable  Last Vitals:  Vitals Value Taken Time  BP 106/53 12/26/19 1450  Temp 97.7   Pulse 59 12/26/19 1452  Resp 13 12/26/19 1452  SpO2 95 % 12/26/19 1452  Vitals shown include unvalidated device data.  Last Pain:  Vitals:   12/26/19 1049  TempSrc:   PainSc: 4       Patients Stated Pain Goal: 2 (83/35/82 5189)  Complications: No complications documented.

## 2019-12-26 NOTE — Op Note (Signed)
Preop diagnosis: Left hip primary osteoarthritis  Postop diagnosis: Same  Procedure: Left total hip arthroplasty, direct anterior approach.  Surgeon: Rodell Perna, MD  Anesthesia: General plus Exparel and Marcaine local.  EBL: 600 cc  Implants: 52 mm Gripton Pin sector cup with 61mm plus neutral liner 52 x 36 mm.  Apex hole eliminator.  Size 6 high offset Actis stem with +1.5 mm 36 mm metal ball.  Procedure: After induction of general anesthesia orotracheal ovation Foley catheter placement placement on the Hana table careful positioning C-arm visualization 1015 drapes were applied followed by DuraPrep sterile skin marker large shower curtain Betadine Steri-Drape hydraulic arm was attached sealed half sheet above and across.  Timeout procedure completed vancomycin was given to the patient's penicillin allergy.  Incision was made 2 cm lateral 1 to 2 cm inferior to the ASIS coming obliquely over the anterior aspect of the trochanter.  Thick subtenons tissue was divided.  Abdomen had been taped over prior to prepping draping.  Fascia was identified nicked extended elevated and cobra retractors placed medially.  Transverse arteries were coagulated carefully fat over the anterior aspect of the hip capsule was removed capsule was opened and cup retractors were placed inside the capsule.  Anterior capsule was resected.  Neck was cut with C arm visualization 1 finger fingerbreadth above the lesser trochanter with an 11 mm neck length.  It was removed with a corkscrew there is severe degenerative changes eburnated bone no cartilage left on the head.  Acetabular preparation reaming up to 51 for a 52 cup identical to the opposite hip that been done 2 years ago.  Cup was selected with 3 dome screw options and cup fit securely no screws were needed.  Apex eliminator was tightened down securely and cup was inserted under C-arm visualization.  Hydraulic hook was applied leg was taken the external rotation 120 down and  under and preparation of the femur was performed large trochanteric clamp was placed over the top the trochanter care taken to keep the trochanter intact.  Mueller retractor medially.  Cookie cutter curettes run sure then sequential preparation and moving up to a #6 broach which gave excellent fit.  Trials were used high offset gave better stability since the hip had trace shock but and extension halfway to the floor and external rotation 90 degrees it would dislocate.  With a high offset hip was stable and leg lengths were identical.  Permanent stem I offset was selected 50 mm +1.5 ball was popped and attached hip reduced identical findings of stability.  The lock closure after Exparel Marcaine infiltration subtendinous tissue muscle.  0 Vicryl in subcutaneous fat reapproximation skin staple closure none Flex 4 x 4's and tape were applied for postoperative dressing patient tolerated procedure well and transferred to care in stable condition.

## 2019-12-26 NOTE — Anesthesia Postprocedure Evaluation (Signed)
Anesthesia Post Note  Patient: Carolyn Rowland  Procedure(s) Performed: TOTAL HIP ARTHROPLASTY-DIRECT ANTERIOR (Left Hip)     Patient location during evaluation: PACU Anesthesia Type: General Level of consciousness: awake and alert, oriented and awake Pain management: pain level controlled Vital Signs Assessment: post-procedure vital signs reviewed and stable Respiratory status: spontaneous breathing, nonlabored ventilation and respiratory function stable Cardiovascular status: blood pressure returned to baseline and stable Postop Assessment: no apparent nausea or vomiting Anesthetic complications: no   No complications documented.  Last Vitals:  Vitals:   12/26/19 1621 12/26/19 1642  BP: 106/62 (!) 103/54  Pulse: (!) 56 62  Resp: 15 16  Temp: 36.5 C 36.9 C  SpO2: 100% 100%    Last Pain:  Vitals:   12/26/19 1642  TempSrc: Oral  PainSc:                  Catalina Gravel

## 2019-12-26 NOTE — Progress Notes (Signed)
PT Cancellation Note  Patient Details Name: Carolyn Rowland MRN: [id number] DOB: [birth year]   Cancelled Treatment:    Reason Eval/Treat Not Completed: Medical issues which prohibited therapy Per RN, pt very lethargic. RN requesting to hold PT at this time. Will follow up as schedule allows.   Reuel Derby, PT, DPT  Acute Rehabilitation Services  Pager: [phone] Office: [phone]  Rudean Hitt 12/26/2019, 5:30 PM

## 2019-12-26 NOTE — H&P (Signed)
TOTAL HIP ADMISSION H&P  Patient is admitted for left total hip arthroplasty.  Subjective:  Chief Complaint: left hip pain  HPI: Carolyn Rowland, 79 y.o. female, has a history of pain and functional disability in the left hip(s) due to arthritis and patient has failed non-surgical conservative treatments for greater than 12 weeks to include NSAID's and/or analgesics, use of assistive devices and activity modification.  Onset of symptoms was gradual starting 3 years ago with gradually worsening course since that time.The patient noted no past surgery on the left hip(s).  Patient currently rates pain in the left hip at 6 out of 10 with activity. Patient has night pain, worsening of pain with activity and weight bearing, trendelenberg gait, pain that interfers with activities of daily living and pain with passive range of motion. Patient has evidence of subchondral cysts, subchondral sclerosis, periarticular osteophytes and joint space narrowing by imaging studies. This condition presents safety issues increasing the risk of falls. This patient has had failed conservative treatment.  There is no current active infection.  Patient Active Problem List   Diagnosis Date Noted  . Arthritis of right hip 12/19/2017  . Spinal stenosis of lumbosacral region 09/22/2017  . Bilateral primary osteoarthritis of hip 09/22/2017  . CAP (community acquired pneumonia) 09/04/2014  . Breast cancer of upper-outer quadrant of right female breast ([place]) 08/27/2014  . Coronary atherosclerosis of native coronary artery 08/18/2013  . S/P PTCA (percutaneous transluminal coronary angioplasty) 08/17/2013   Past Medical History:  Diagnosis Date  . Anemia    in younger years  . Anginal pain ([place])   . Aortic stenosis    mild to moderate AS 07/2013; moderate AS 08/2019   . Arthritis    "hands; legs" (08/17/2013)  . CAP (community acquired pneumonia)   . Coronary artery disease    6 stents in 2015  . GERD (gastroesophageal  reflux disease)   . H/O hiatal hernia   . Heart murmur   . High cholesterol   . Hx of adenomatous colonic polyps 08/15/2018  . Hypertension   . Pneumonia 1970's   "once"  . Radiation 11/28/14-01/02/15   Right breast [address]  . Vertigo     Past Surgical History:  Procedure Laterality Date  . ABDOMINAL HYSTERECTOMY  1985  . APPENDECTOMY  1960  . BREAST LUMPECTOMY WITH NEEDLE LOCALIZATION AND AXILLARY SENTINEL LYMPH NODE BX Right 10/17/2014   Procedure: BREAST LUMPECTOMY WITH NEEDLE LOCALIZATION AND AXILLARY SENTINEL LYMPH NODE BX;  Surgeon: Jackolyn Confer, MD;  Location: [facility];  Service: General;  Laterality: Right;  . CARDIAC CATHETERIZATION  08/07/2013  . COLONOSCOPY    . CORONARY ANGIOPLASTY WITH STENT PLACEMENT  08/17/2013   "6 stents"(08/17/2013)  . LEFT AND RIGHT HEART CATHETERIZATION WITH CORONARY ANGIOGRAM N/A 08/07/2013   Procedure: LEFT AND RIGHT HEART CATHETERIZATION WITH CORONARY ANGIOGRAM;  Surgeon: Laverda Page, MD;  Location: [facility] CATH LAB;  Service: Cardiovascular;  Laterality: N/A;  . PERCUTANEOUS CORONARY STENT INTERVENTION (PCI-S) N/A 08/17/2013   Procedure: PERCUTANEOUS CORONARY STENT INTERVENTION (PCI-S);  Surgeon: Laverda Page, MD;  Location: [facility] CATH LAB;  Service: Cardiovascular;  Laterality: N/A;  . TOTAL HIP ARTHROPLASTY Right 12/19/2017   Procedure: RIGHT TOTAL HIP ARTHROPLASTY ANTERIOR APPROACH;  Surgeon: Marybelle Killings, MD;  Location: [facility];  Service: Orthopedics;  Laterality: Right;  . TUBAL LIGATION  1980    Current Facility-Administered Medications  Medication Dose Route Frequency Provider Last Rate Last Admin  . bupivacaine liposome (EXPAREL) 1.3 % injection  266 mg  20 mL Infiltration Once Marybelle Killings, MD      . lactated ringers infusion   Intravenous Continuous Audry Pili, MD 10 mL/hr at 12/26/19 1105 New Bag at 12/26/19 1105  . vancomycin (VANCOCIN) IVPB 1000 mg/200 mL premix  1,000 mg Intravenous On Call to OR Lanae Crumbly, PA-C 200 mL/hr at  12/26/19 1109 1,000 mg at 12/26/19 1109   Allergies  Allergen Reactions  . Penicillins Rash    Has patient had a PCN reaction causing immediate rash, facial/tongue/throat swelling, SOB or lightheadedness with hypotension: No Has patient had a PCN reaction causing severe rash involving mucus membranes or skin necrosis: Unknown Has patient had a PCN reaction that required hospitalization: No Has patient had a PCN reaction occurring within the last 10 years: No If all of the above answers are "NO", then may proceed with Cephalosporin use.     Social History   Tobacco Use  . Smoking status: Former Smoker    Packs/day: 1.00    Years: 52.00    Pack years: 52.00    Types: Cigarettes    Quit date: 04/04/2014    Years since quitting: 5.7  . Smokeless tobacco: Never Used  Substance Use Topics  . Alcohol use: No    Comment: 08/17/2013 "quit drinking at age 53; never did drink much"    Family History  Problem Relation Age of Onset  . Cancer Brother   . Colon cancer Brother   . Cancer Brother   . Heart attack Brother   . Esophageal cancer Neg Hx   . Stomach cancer Neg Hx   . Rectal cancer Neg Hx      Review of Systems  Objective:  Physical Exam  Vital signs in last 24 hours: Temp:  [97.9 F (36.6 C)] 97.9 F (36.6 C) (06/23 1035) Pulse Rate:  [71] 71 (06/23 1035) Resp:  [18] 18 (06/23 1035) BP: (140)/(61) 140/61 (06/23 1035) SpO2:  [100 %] 100 % (06/23 1035) Weight:  [97 kg] 97 kg (06/23 1035)  Labs:   Estimated body mass index is 35.58 kg/m as calculated from the following:   Height as of this encounter: 5\' 5"  (1.651 m).   Weight as of this encounter: 97 kg.   Imaging Review Plain radiographs demonstrate severe degenerative joint disease of the left hip(s). The bone quality appears to be good for age and reported activity level.      Assessment/Plan:  End stage arthritis, left hip(s)  The patient history, physical examination, clinical judgement of the  provider and imaging studies are consistent with end stage degenerative joint disease of the left hip(s) and total hip arthroplasty is deemed medically necessary. The treatment options including medical management, injection therapy, arthroscopy and arthroplasty were discussed at length. The risks and benefits of total hip arthroplasty were presented and reviewed. The risks due to aseptic loosening, infection, stiffness, dislocation/subluxation,  thromboembolic complications and other imponderables were discussed.  The patient acknowledged the explanation, agreed to proceed with the plan and consent was signed. Patient is being admitted for inpatient treatment for surgery, pain control, PT, OT, prophylactic antibiotics, VTE prophylaxis, progressive ambulation and ADL's and discharge planning.The patient is planning to be discharged home with walker and daily ambulation    Patient's anticipated LOS is less than 2 midnights, meeting these requirements: - Younger than 11 - Lives within 1 hour of care - Has a competent adult at home to recover with post-op recover - NO history of  -  Chronic pain requiring opiods  - Diabetes  - Coronary Artery Disease  - Heart failure  - Heart attack  - Stroke  - DVT/VTE  - Cardiac arrhythmia  - Respiratory Failure/COPD  - Renal failure  - Anemia  - Advanced Liver disease  Does have Hx of CAD.

## 2019-12-26 NOTE — Interval H&P Note (Signed)
History and Physical Interval Note:  7/90/2409 73:53 PM  Carolyn Rowland  has presented today for surgery, with the diagnosis of left hip osteoarthritis.  The various methods of treatment have been discussed with the patient and family. After consideration of risks, benefits and other options for treatment, the patient has consented to  Procedure(s): TOTAL HIP ARTHROPLASTY-DIRECT ANTERIOR (Left) as a surgical intervention.  The patient's history has been reviewed, patient examined, no change in status, stable for surgery.  I have reviewed the patient's chart and labs.  Questions were answered to the patient's satisfaction.     Marybelle Killings Last THA 2 yrs ago pt took several days to be able to get OOB and walk. Expect slow progress likely.

## 2019-12-27 ENCOUNTER — Encounter (HOSPITAL_COMMUNITY): Payer: Self-pay | Admitting: Orthopaedic Surgery

## 2019-12-27 DIAGNOSIS — E669 Obesity, unspecified: Secondary | ICD-10-CM | POA: Diagnosis not present

## 2019-12-27 DIAGNOSIS — M1612 Unilateral primary osteoarthritis, left hip: Secondary | ICD-10-CM | POA: Diagnosis not present

## 2019-12-27 DIAGNOSIS — Z96641 Presence of right artificial hip joint: Secondary | ICD-10-CM | POA: Diagnosis present

## 2019-12-27 DIAGNOSIS — M1611 Unilateral primary osteoarthritis, right hip: Secondary | ICD-10-CM | POA: Diagnosis not present

## 2019-12-27 DIAGNOSIS — Z87891 Personal history of nicotine dependence: Secondary | ICD-10-CM | POA: Diagnosis not present

## 2019-12-27 DIAGNOSIS — Z6835 Body mass index (BMI) 35.0-35.9, adult: Secondary | ICD-10-CM | POA: Diagnosis not present

## 2019-12-27 DIAGNOSIS — Z8701 Personal history of pneumonia (recurrent): Secondary | ICD-10-CM | POA: Diagnosis not present

## 2019-12-27 DIAGNOSIS — Z79899 Other long term (current) drug therapy: Secondary | ICD-10-CM | POA: Diagnosis not present

## 2019-12-27 DIAGNOSIS — K219 Gastro-esophageal reflux disease without esophagitis: Secondary | ICD-10-CM | POA: Diagnosis not present

## 2019-12-27 DIAGNOSIS — I10 Essential (primary) hypertension: Secondary | ICD-10-CM | POA: Diagnosis not present

## 2019-12-27 DIAGNOSIS — Z955 Presence of coronary angioplasty implant and graft: Secondary | ICD-10-CM | POA: Diagnosis not present

## 2019-12-27 DIAGNOSIS — Z7982 Long term (current) use of aspirin: Secondary | ICD-10-CM | POA: Diagnosis not present

## 2019-12-27 DIAGNOSIS — E7800 Pure hypercholesterolemia, unspecified: Secondary | ICD-10-CM | POA: Diagnosis not present

## 2019-12-27 DIAGNOSIS — Z20822 Contact with and (suspected) exposure to covid-19: Secondary | ICD-10-CM | POA: Diagnosis not present

## 2019-12-27 DIAGNOSIS — Z9181 History of falling: Secondary | ICD-10-CM | POA: Diagnosis not present

## 2019-12-27 DIAGNOSIS — Z96649 Presence of unspecified artificial hip joint: Secondary | ICD-10-CM

## 2019-12-27 DIAGNOSIS — I2510 Atherosclerotic heart disease of native coronary artery without angina pectoris: Secondary | ICD-10-CM | POA: Diagnosis not present

## 2019-12-27 DIAGNOSIS — Z853 Personal history of malignant neoplasm of breast: Secondary | ICD-10-CM | POA: Diagnosis not present

## 2019-12-27 DIAGNOSIS — I350 Nonrheumatic aortic (valve) stenosis: Secondary | ICD-10-CM | POA: Diagnosis not present

## 2019-12-27 DIAGNOSIS — Z880 Allergy status to penicillin: Secondary | ICD-10-CM | POA: Diagnosis not present

## 2019-12-27 DIAGNOSIS — M25752 Osteophyte, left hip: Secondary | ICD-10-CM | POA: Diagnosis not present

## 2019-12-27 DIAGNOSIS — M85662 Other cyst of bone, left lower leg: Secondary | ICD-10-CM | POA: Diagnosis not present

## 2019-12-27 DIAGNOSIS — Z8249 Family history of ischemic heart disease and other diseases of the circulatory system: Secondary | ICD-10-CM | POA: Diagnosis not present

## 2019-12-27 LAB — CBC
HCT: 30.7 % — ABNORMAL LOW (ref 36.0–46.0)
Hemoglobin: 10.6 g/dL — ABNORMAL LOW (ref 12.0–15.0)
MCH: 28.3 pg (ref 26.0–34.0)
MCHC: 34.5 g/dL (ref 30.0–36.0)
MCV: 81.9 fL (ref 80.0–100.0)
Platelets: 153 10*3/uL (ref 150–400)
RBC: 3.75 MIL/uL — ABNORMAL LOW (ref 3.87–5.11)
RDW: 14.8 % (ref 11.5–15.5)
WBC: 8.8 10*3/uL (ref 4.0–10.5)
nRBC: 0 % (ref 0.0–0.2)

## 2019-12-27 NOTE — Plan of Care (Signed)

## 2019-12-27 NOTE — Progress Notes (Signed)
Only walked 2 steps then asked to sit down. Last THA was also very slow progress. Change to admission since she cannot make it to bathroom and cannot go home at this time.

## 2019-12-27 NOTE — Evaluation (Signed)
Physical Therapy Evaluation Patient Details Name: Carolyn Rowland MRN: [id number] DOB: January 11, 1941 Today's Date: 12/27/2019   History of Present Illness  Pt is a 79 y.o. F with significant PMH of breast cancer, right THA who presents s/p left total hip arthroplasty, direct anterior approach.   Clinical Impression  Pt presents s/p procedure listed above. Prior to admission, pt lives with her daughter and is independent with mobility/ADL's. Presents with decreased functional mobility secondary to LLE weakness, pain, balance deficits, decreased activity tolerance. Initiated session with bed level exercises. Pt requiring moderate assist for functional mobility. Ambulating 2 steps forward with a walker before asking to sit down due to pain. Hopeful she will continue to progress with adequate pain control/management. Will progress as tolerated.     Follow Up Recommendations Home health PT;Supervision/Assistance - 24 hour    Equipment Recommendations  3in1 (PT)    Recommendations for Other Services OT consult     Precautions / Restrictions Precautions Precautions: Fall Restrictions Weight Bearing Restrictions: Yes LLE Weight Bearing: Weight bearing as tolerated      Mobility  Bed Mobility Overal bed mobility: Needs Assistance Bed Mobility: Supine to Sit     Supine to sit: Mod assist     General bed mobility comments: ModA for LLE negotiation out of bed, use of bed pad to scoot hips forward  Transfers Overall transfer level: Needs assistance Equipment used: Rolling walker (2 wheeled) Transfers: Sit to/from Stand Sit to Stand: Mod assist         General transfer comment: ModA to rise from elevated bed height, pt pulling from walker so PT stabilized.   Ambulation/Gait Ambulation/Gait assistance: Min assist Gait Distance (Feet): 2 Feet Assistive device: Rolling walker (2 wheeled) Gait Pattern/deviations: Step-to pattern;Decreased weight shift to left;Decreased stride  length;Trunk flexed;Antalgic Gait velocity: decreased Gait velocity interpretation: <1.31 ft/sec, indicative of household ambulator General Gait Details: Pt taking two steps forward with minA, cues for sequencing and use of arms on walker. Pt then leaning down on forearms and stating she could not go further. Chair  brought behind her to allow her to sit.  Stairs            Wheelchair Mobility    Modified Rankin (Stroke Patients Only)       Balance Overall balance assessment: Needs assistance Sitting-balance support: Feet supported Sitting balance-Leahy Scale: Fair     Standing balance support: Bilateral upper extremity supported Standing balance-Leahy Scale: Poor                               Pertinent Vitals/Pain Pain Assessment: Faces Faces Pain Scale: Hurts whole lot Pain Location: L hip with movement Pain Descriptors / Indicators: Grimacing;Guarding;Aching;Sore Pain Intervention(s): Limited activity within patient's tolerance;Monitored during session;Repositioned;Ice applied    Home Living Family/patient expects to be discharged to:: Private residence Living Arrangements: Children Available Help at Discharge: Family;Available 24 hours/day (daughter available 24/7 first week, then sister) Type of Home: House Home Access: Stairs to enter Entrance Stairs-Rails: Right Entrance Stairs-Number of Steps: 3 Home Layout: One level Home Equipment: Walker - 2 wheels      Prior Function Level of Independence: Independent               Hand Dominance        Extremity/Trunk Assessment   Upper Extremity Assessment Upper Extremity Assessment: Defer to OT evaluation    Lower Extremity Assessment Lower Extremity Assessment: LLE deficits/detail LLE Deficits /  Details: S/p THA, ankle dorsiflexion 5/5, unable to perform heel slide       Communication   Communication: No difficulties  Cognition Arousal/Alertness: Awake/alert Behavior During  Therapy: WFL for tasks assessed/performed Overall Cognitive Status: Within Functional Limits for tasks assessed                                        General Comments      Exercises Total Joint Exercises Ankle Circles/Pumps: Both;20 reps;Supine Quad Sets: Both;15 reps;Supine   Assessment/Plan    PT Assessment Patient needs continued PT services  PT Problem List Decreased strength;Decreased range of motion;Decreased activity tolerance;Decreased balance;Decreased mobility;Pain       PT Treatment Interventions DME instruction;Gait training;Stair training;Functional mobility training;Therapeutic activities;Therapeutic exercise;Balance training;Patient/family education    PT Goals (Current goals can be found in the Care Plan section)  Acute Rehab PT Goals Patient Stated Goal: Less pain PT Goal Formulation: With patient Time For Goal Achievement: 01/10/20 Potential to Achieve Goals: Good    Frequency 7X/week   Barriers to discharge        Co-evaluation               AM-PAC PT "6 Clicks" Mobility  Outcome Measure Help needed turning from your back to your side while in a flat bed without using bedrails?: A Little Help needed moving from lying on your back to sitting on the side of a flat bed without using bedrails?: A Lot Help needed moving to and from a bed to a chair (including a wheelchair)?: A Lot Help needed standing up from a chair using your arms (e.g., wheelchair or bedside chair)?: A Lot Help needed to walk in hospital room?: A Little Help needed climbing 3-5 steps with a railing? : A Lot 6 Click Score: 14    End of Session Equipment Utilized During Treatment: Gait belt Activity Tolerance: Patient limited by pain Patient left: in chair;with call bell/phone within reach;with chair alarm set Nurse Communication: Mobility status PT Visit Diagnosis: Other abnormalities of gait and mobility (R26.89);Difficulty in walking, not elsewhere  classified (R26.2);Pain Pain - Right/Left: Left Pain - part of body: Hip    Time: 0820-0852 PT Time Calculation (min) (ACUTE ONLY): 32 min   Charges:   PT Evaluation $PT Eval Moderate Complexity: 1 Mod PT Treatments $Gait Training: 8-22 mins          Wyona Almas, PT, DPT Acute Rehabilitation Services Pager [phone] Office [phone]   Deno Etienne 12/27/2019, 12:35 PM

## 2019-12-27 NOTE — Evaluation (Signed)
Occupational Therapy Evaluation Patient Details Name: Carolyn Rowland MRN: [id number] DOB: 1940-08-13 Today's Date: 12/27/2019    History of Present Illness Pt is a 79 y.o. F with significant PMH of breast cancer, right THA who presents s/p left total hip arthroplasty, direct anterior approach.    Clinical Impression   Pt admitted with the above diagnoses and presents with below problem list. Pt will benefit from continued acute OT to address the below listed deficits and maximize independence with basic ADLs prior to d/c to venue below. PTA pt was independent to mod I with ADLs. Pt is currently mod A with LB ADLs and functional transfers, min A with short distance mobility. Pt able to walk from recliner to threshold of bathroom door (~6') this session with extra time and effort, min A to steady. Lots of encouragement and cueing needed to mobilize. Pt reporting, in addition to FACES 10/10 pain, SOB and nausea necessitating end of mobility this session. VSS throughout session.       Follow Up Recommendations  Home health OT;Supervision/Assistance - 24 hour    Equipment Recommendations  3 in 1 bedside commode    Recommendations for Other Services       Precautions / Restrictions Precautions Precautions: Fall Restrictions Weight Bearing Restrictions: Yes LLE Weight Bearing: Weight bearing as tolerated      Mobility Bed Mobility Overal bed mobility: Needs Assistance Bed Mobility: Sit to Supine     Supine to sit: Mod assist Sit to supine: Mod assist   General bed mobility comments: Cues for sequencing movements. VERY guarded with LLE. asking for assist to advance RLE along with LLE. Multiple attempts to scoot into a midline position once in the ned.   Transfers Overall transfer level: Needs assistance Equipment used: Rolling walker (2 wheeled) Transfers: Sit to/from [employer] Sit to Stand: Mod assist Stand pivot transfers: Mod assist       General  transfer comment: recliner>EOB. extra time and effort. assist to powerup, steady and control descent. Encouragement needed.     Balance Overall balance assessment: Needs assistance Sitting-balance support: Feet supported Sitting balance-Leahy Scale: Fair     Standing balance support: Bilateral upper extremity supported Standing balance-Leahy Scale: Poor                             ADL either performed or assessed with clinical judgement   ADL Overall ADL's : Needs assistance/impaired Eating/Feeding: Set up;Sitting   Grooming: Set up;Sitting   Upper Body Bathing: Set up;Sitting;Minimal assistance   Lower Body Bathing: Moderate assistance;Sit to/from stand   Upper Body Dressing : Set up;Minimal assistance;Sitting   Lower Body Dressing: Moderate assistance;Sit to/from stand   Toilet Transfer: Minimal assistance;Ambulation;BSC;RW Toilet Transfer Details (indicate cue type and reason): walked from recliner to bathroom entrance with max encouragement to continue. Pt then stating SOB and dizzy so terminated mobility at that point.          Functional mobility during ADLs: Minimal assistance;Rolling walker General ADL Comments: Received in recliner. Attempted to walk to bathroom with max encouragement. Ultimately made it to bathroom entrance. SPT to return to bed.      Vision         Perception     Praxis      Pertinent Vitals/Pain Pain Assessment: Faces Faces Pain Scale: Hurts whole lot Pain Location: LLE with touch or movement Pain Descriptors / Indicators: Grimacing;Guarding;Sore;Moaning Pain Intervention(s): Limited activity within patient's tolerance;Monitored  during session;Repositioned;Ice applied     Hand Dominance     Extremity/Trunk Assessment Upper Extremity Assessment Upper Extremity Assessment: Generalized weakness   Lower Extremity Assessment Lower Extremity Assessment: Defer to PT evaluation LLE Deficits / Details: S/p THA, ankle  dorsiflexion 5/5, unable to perform heel slide       Communication Communication Communication: No difficulties   Cognition Arousal/Alertness: Awake/alert Behavior During Therapy: Anxious;Flat affect Overall Cognitive Status: No family/caregiver present to determine baseline cognitive functioning                                 General Comments: some difficulty problem solving. very anxious throughout session. Screamed in pain when OT touch L foot to begin adjusting sock. Very internally distracted by pain/anxiety. Requires encouragement to mobilize. Tried utilizing breathing exercises for relaxtion without a lot of success. "I just need something to focus on"   General Comments  VSS throughout session.     Exercises Exercises: Total Joint Total Joint Exercises Ankle Circles/Pumps: Both;20 reps;Supine Quad Sets: Both;15 reps;Supine   Shoulder Instructions      Home Living Family/patient expects to be discharged to:: Private residence Living Arrangements: Children Available Help at Discharge: Family;Available 24 hours/day Type of Home: House Home Access: Stairs to enter [employer] of Steps: 3 Entrance Stairs-Rails: Right Home Layout: One level     Bathroom Shower/Tub: Tub/shower unit         Home Equipment: Walker - 2 wheels          Prior Functioning/Environment Level of Independence: Independent                 OT Problem List: Decreased strength;Decreased activity tolerance;Impaired balance (sitting and/or standing);Decreased knowledge of use of DME or AE;Decreased knowledge of precautions;Pain      OT Treatment/Interventions: Self-care/ADL training;Therapeutic exercise;DME and/or AE instruction;Therapeutic activities;Patient/family education;Balance training    OT Goals(Current goals can be found in the care plan section) Acute Rehab OT Goals Patient Stated Goal: Less pain OT Goal Formulation: With patient Time For Goal  Achievement: 01/10/20 Potential to Achieve Goals: Good ADL Goals Pt Will Perform Lower Body Bathing: with min guard assist;with adaptive equipment;sit to/from stand Pt Will Perform Lower Body Dressing: with min guard assist;with adaptive equipment;sit to/from stand Pt Will Transfer to Toilet: with min guard assist;ambulating Pt Will Perform Toileting - Clothing Manipulation and hygiene: with min guard assist;sit to/from stand Additional ADL Goal #1: Pt will complete bed mobility at min guard level to prepare for OOB ADLs.  OT Frequency: Min 2X/week   Barriers to D/C:            Co-evaluation              AM-PAC OT "6 Clicks" Daily Activity     Outcome Measure Help from another person eating meals?: None Help from another person taking care of personal grooming?: None Help from another person toileting, which includes using toliet, bedpan, or urinal?: A Lot Help from another person bathing (including washing, rinsing, drying)?: A Lot Help from another person to put on and taking off regular upper body clothing?: A Little Help from another person to put on and taking off regular lower body clothing?: A Lot 6 Click Score: 17   End of Session Equipment Utilized During Treatment: Gait belt;Rolling walker  Activity Tolerance: Patient limited by pain;Other (comment) (very anxious. reporting SOB after walking a few feet) Patient left: in bed;with call  bell/phone within reach;with bed alarm set  OT Visit Diagnosis: Unsteadiness on feet (R26.81);Pain                Time: 8938-1017 OT Time Calculation (min): 26 min Charges:  OT General Charges $OT Visit: 1 Visit OT Evaluation $OT Eval Low Complexity: 1 Low OT Treatments $Self Care/Home Management : 8-22 mins  Tyrone Schimke, OT Acute Rehabilitation Services Pager: [phone] Office: [phone]   Hortencia Pilar 12/27/2019, 2:25 PM

## 2019-12-27 NOTE — Progress Notes (Signed)
Physical Therapy Treatment Patient Details Name: Carolyn Rowland MRN: [id number] DOB: January 29, 1941 Today's Date: 12/27/2019    History of Present Illness Pt is a 79 y.o. F with significant PMH of breast cancer, right THA who presents s/p left total hip arthroplasty, direct anterior approach.     PT Comments    Pt progressing very slowly towards her physical therapy goals. Premedicated prior to session. Requiring min-mod assist for functional mobility. Ambulating 12 feet with a walker before requiring a seated rest break. Upon return to bed, pt with positive nausea, dizziness, diaphoresis. She stated she felt like she was about to faint so returned quickly to supine; BP 84/71, HR 60, SpO2 100% on RA. After ~5 minutes supine, BP rebounded to 116/95. Will continue to progress as tolerated.    Follow Up Recommendations  Home health PT;Supervision/Assistance - 24 hour     Equipment Recommendations  3in1 (PT)    Recommendations for Other Services OT consult     Precautions / Restrictions Precautions Precautions: Fall Restrictions Weight Bearing Restrictions: Yes LLE Weight Bearing: Weight bearing as tolerated    Mobility  Bed Mobility Overal bed mobility: Needs Assistance Bed Mobility: Supine to Sit     Supine to sit: Mod assist Sit to supine: Mod assist   General bed mobility comments: ModA for BLE negotiation, significantly increased time/effort  Transfers Overall transfer level: Needs assistance Equipment used: Rolling walker (2 wheeled) Transfers: Sit to/from Stand Sit to Stand: Mod assist Stand pivot transfers: Mod assist       General transfer comment: ModA to rise from elevated bed height, pt pulling from walker so PT stabilized.   Ambulation/Gait Ambulation/Gait assistance: Min assist Gait Distance (Feet): 12 Feet Assistive device: Rolling walker (2 wheeled) Gait Pattern/deviations: Step-to pattern;Decreased weight shift to left;Decreased stride length;Trunk  flexed;Antalgic Gait velocity: decreased Gait velocity interpretation: <1.31 ft/sec, indicative of household ambulator General Gait Details: cues for upright posture, walker proximity, use of arms, sequencing. pt with decreased right foot clearance, heavy reliance through arms on walker, increased trunk flexion. At times, would reach for other objects in room i.e. chair, foot of bed and take her hand off the walker. cues for maintaining hands on walker for safety.    Stairs             Wheelchair Mobility    Modified Rankin (Stroke Patients Only)       Balance Overall balance assessment: Needs assistance Sitting-balance support: Feet supported Sitting balance-Leahy Scale: Fair     Standing balance support: Bilateral upper extremity supported Standing balance-Leahy Scale: Poor                              Cognition Arousal/Alertness: Awake/alert Behavior During Therapy: WFL for tasks assessed/performed Overall Cognitive Status: Within Functional Limits for tasks assessed                                 General Comments: some difficulty problem solving. very anxious throughout session. Screamed in pain when OT touch L foot to begin adjusting sock. Very internally distracted by pain/anxiety. Requires encouragement to mobilize. Tried utilizing breathing exercises for relaxtion without a lot of success. "I just need something to focus on"      Exercises Total Joint Exercises Long Arc Quad: Both;5 reps;Seated;AROM;AAROM    General Comments General comments (skin integrity, edema, etc.): VSS throughout session.  Pertinent Vitals/Pain Pain Assessment: Faces Faces Pain Scale: Hurts whole lot Pain Location: L hip with movement Pain Descriptors / Indicators: Grimacing;Guarding;Aching;Sore Pain Intervention(s): Limited activity within patient's tolerance;Monitored during session;Premedicated before session;Repositioned    Home Living  Family/patient expects to be discharged to:: Private residence Living Arrangements: Children Available Help at Discharge: Family;Available 24 hours/day Type of Home: House Home Access: Stairs to enter Entrance Stairs-Rails: Right Home Layout: One level Home Equipment: Environmental consultant - 2 wheels      Prior Function Level of Independence: Independent          PT Goals (current goals can now be found in the care plan section) Acute Rehab PT Goals Patient Stated Goal: Less pain PT Goal Formulation: With patient Time For Goal Achievement: 01/10/20 Potential to Achieve Goals: Good    Frequency    7X/week      PT Plan Current plan remains appropriate    Co-evaluation              AM-PAC PT "6 Clicks" Mobility   Outcome Measure  Help needed turning from your back to your side while in a flat bed without using bedrails?: A Little Help needed moving from lying on your back to sitting on the side of a flat bed without using bedrails?: A Lot Help needed moving to and from a bed to a chair (including a wheelchair)?: A Lot Help needed standing up from a chair using your arms (e.g., wheelchair or bedside chair)?: A Lot Help needed to walk in hospital room?: A Little Help needed climbing 3-5 steps with a railing? : A Lot 6 Click Score: 14    End of Session Equipment Utilized During Treatment: Gait belt Activity Tolerance: Patient limited by pain Patient left: with call bell/phone within reach;in bed;with bed alarm set Nurse Communication: Mobility status PT Visit Diagnosis: Other abnormalities of gait and mobility (R26.89);Difficulty in walking, not elsewhere classified (R26.2);Pain Pain - Right/Left: Left Pain - part of body: Hip     Time: 1529-1610 PT Time Calculation (min) (ACUTE ONLY): 41 min  Charges:  $Gait Training: 8-22 mins $Therapeutic Activity: 23-37 mins                       Wyona Almas, PT, DPT Acute Rehabilitation Services Pager [phone] Office  [phone]    Deno Etienne 12/27/2019, 5:31 PM

## 2019-12-27 NOTE — Progress Notes (Signed)
   Subjective: 1 Day Post-Op Procedure(s) (LRB): TOTAL HIP ARTHROPLASTY-DIRECT ANTERIOR (Left) Patient reports pain as mild.  " I am nauseated, I am having a hot flash, I'm dizzy , when can I get up and take this oxygen off"   Objective: Vital signs in last 24 hours: Temp:  [97.7 F (36.5 C)-98.7 F (37.1 C)] 98.7 F (37.1 C) (06/23 2300) Pulse Rate:  [53-71] 61 (06/23 2300) Resp:  [9-18] 16 (06/23 2300) BP: (95-140)/(53-67) 101/56 (06/23 2300) SpO2:  [97 %-100 %] 100 % (06/23 2300) Weight:  [97 kg] 97 kg (06/23 1035)  Intake/Output from previous day: 06/23 0701 - 06/24 0700 In: 2573.2 [I.V.:2473.2; IV Piggyback:100] Out: 1525 [Urine:925; Blood:600] Intake/Output this shift: No intake/output data recorded.  Recent Labs    12/27/19 0420  HGB 10.6*   Recent Labs    12/27/19 0420  WBC 8.8  RBC 3.75*  HCT 30.7*  PLT 153   No results for input(s): NA, K, CL, CO2, BUN, CREATININE, GLUCOSE, CALCIUM in the last 72 hours. No results for input(s): LABPT, INR in the last 72 hours.  Neurologically intact, small spot blood on mepilex dressing .  DG C-Arm 1-60 Min  Result Date: 12/26/2019 CLINICAL DATA:  Left hip replacement EXAM: OPERATIVE LEFT HIP WITH PELVIS; DG C-ARM 1-60 MIN COMPARISON:  None. FLUOROSCOPY TIME:  Fluoroscopy Time:  18 seconds Radiation Exposure Index (if provided by the fluoroscopic device): 1.28 mGy Number of Acquired Spot Images: 2 FINDINGS: Left hip prosthesis is noted in satisfactory position. No acute fracture or dislocation is noted. Prior right hip prosthesis is noted as well. IMPRESSION: Status post hip replacement without acute abnormality. Electronically Signed   By: [name]   On: 12/26/2019 15:57   DG HIP OPERATIVE UNILAT W OR W/O PELVIS LEFT  Result Date: 12/26/2019 CLINICAL DATA:  Left hip replacement EXAM: OPERATIVE LEFT HIP WITH PELVIS; DG C-ARM 1-60 MIN COMPARISON:  None. FLUOROSCOPY TIME:  Fluoroscopy Time:  18 seconds Radiation  Exposure Index (if provided by the fluoroscopic device): 1.28 mGy Number of Acquired Spot Images: 2 FINDINGS: Left hip prosthesis is noted in satisfactory position. No acute fracture or dislocation is noted. Prior right hip prosthesis is noted as well. IMPRESSION: Status post hip replacement without acute abnormality. Electronically Signed   By: [name]   On: 12/26/2019 15:57    Assessment/Plan: 1 Day Post-Op Procedure(s) (LRB): TOTAL HIP ARTHROPLASTY-DIRECT ANTERIOR (Left) Up with therapy.discussed with patient she will feel better once she gets up and mobile.   Carolyn Rowland 12/27/2019, 7:34 AM

## 2019-12-28 LAB — CBC
HCT: 30.4 % — ABNORMAL LOW (ref 36.0–46.0)
Hemoglobin: 10.5 g/dL — ABNORMAL LOW (ref 12.0–15.0)
MCH: 28.2 pg (ref 26.0–34.0)
MCHC: 34.5 g/dL (ref 30.0–36.0)
MCV: 81.5 fL (ref 80.0–100.0)
Platelets: 118 10*3/uL — ABNORMAL LOW (ref 150–400)
RBC: 3.73 MIL/uL — ABNORMAL LOW (ref 3.87–5.11)
RDW: 14.6 % (ref 11.5–15.5)
WBC: 8.9 10*3/uL (ref 4.0–10.5)
nRBC: 0 % (ref 0.0–0.2)

## 2019-12-28 LAB — BASIC METABOLIC PANEL WITH GFR
Anion gap: 9 (ref 5–15)
BUN: 15 mg/dL (ref 8–23)
CO2: 23 mmol/L (ref 22–32)
Calcium: 8.3 mg/dL — ABNORMAL LOW (ref 8.9–10.3)
Chloride: 106 mmol/L (ref 98–111)
Creatinine, Ser: 1.08 mg/dL — ABNORMAL HIGH (ref 0.44–1.00)
GFR calc Af Amer: 58 mL/min — ABNORMAL LOW
GFR calc non Af Amer: 50 mL/min — ABNORMAL LOW
Glucose, Bld: 132 mg/dL — ABNORMAL HIGH (ref 70–99)
Potassium: 4.4 mmol/L (ref 3.5–5.1)
Sodium: 138 mmol/L (ref 135–145)

## 2019-12-28 MED ORDER — OXYCODONE HCL 5 MG PO TABS
5.0000 mg | ORAL_TABLET | ORAL | Status: DC | PRN
  Administered 2019-12-28 – 2019-12-30 (×3): 10 mg via ORAL
  Filled 2019-12-28 (×3): qty 2

## 2019-12-28 NOTE — Progress Notes (Signed)
   Subjective: 2 Days Post-Op Procedure(s) (LRB): TOTAL HIP ARTHROPLASTY-DIRECT ANTERIOR (Left) Patient reports pain as mild and moderate.    Objective: Vital signs in last 24 hours: Temp:  [98.5 F (36.9 C)-99.6 F (37.6 C)] 98.5 F (36.9 C) (06/25 1411) Pulse Rate:  [70-81] 70 (06/25 1411) Resp:  [16-17] 17 (06/25 1411) BP: (97-128)/(54-65) 125/57 (06/25 1411) SpO2:  [94 %-99 %] 99 % (06/25 1411)  Intake/Output from previous day: 06/24 0701 - 06/25 0700 In: 240 [P.O.:240] Out: -  Intake/Output this shift: No intake/output data recorded.  Recent Labs    12/27/19 0420 12/28/19 0414  HGB 10.6* 10.5*   Recent Labs    12/27/19 0420 12/28/19 0414  WBC 8.8 8.9  RBC 3.75* 3.73*  HCT 30.7* 30.4*  PLT 153 118*   Recent Labs    12/28/19 0414  NA 138  K 4.4  CL 106  CO2 23  BUN 15  CREATININE 1.08*  GLUCOSE 132*  CALCIUM 8.3*   No results for input(s): LABPT, INR in the last 72 hours.  Neurologically intact No results found.  Assessment/Plan: 2 Days Post-Op Procedure(s) (LRB): TOTAL HIP ARTHROPLASTY-DIRECT ANTERIOR (Left) Up with therapy. IV pain meds made her hallucinate. Will d/c . PO pain meds last longer  Carolyn Rowland 12/28/2019, 6:52 PM

## 2019-12-28 NOTE — Progress Notes (Signed)
Pt was seen for mobility with gait on RW, with improved tolerance for standing and using LLE.  Pt used SPC for mobility to get LLE of the bed.  Follow up with mobility to walk farther, to increase her control of L knee and hip to stand with reduced assistance and to shorten her need to stay in rehab.   12/28/19 2100  PT Visit Information  Last PT Received On 12/28/19  Assistance Needed +1  History of Present Illness Pt is a 79 y.o. F with significant PMH of breast cancer, right THA who presents s/p left total hip arthroplasty, direct anterior approach.   Subjective Data  Subjective states she is in pain and nauseated  Patient Stated Goal Less pain  Precautions  Precautions Fall  Restrictions  Weight Bearing Restrictions Yes  LLE Weight Bearing WBAT  Pain Assessment  Pain Assessment Faces  Faces Pain Scale 8  Pain Location L hip with movement  Pain Descriptors / Indicators Guarding;Operative site guarding  Pain Intervention(s) Limited activity within patient's tolerance;Monitored during session;Premedicated before session;Repositioned;Ice applied  Cognition  Arousal/Alertness Awake/alert  Behavior During Therapy WFL for tasks assessed/performed  Overall Cognitive Status Within Functional Limits for tasks assessed  Bed Mobility  Overal bed mobility Needs Assistance  Bed Mobility Supine to Sit  Supine to sit Mod assist;Min assist  General bed mobility comments pt used her cane to assist LLE as any effort including touch would cause her to scream  Transfers  Overall transfer level Needs assistance  Equipment used Rolling walker (2 wheeled)  Transfers Sit to/from Stand  Sit to Stand Mod assist  Stand pivot transfers Mod assist  General transfer comment assisted with RW and cues for placement of LLE to stand and sit   Ambulation/Gait  Ambulation/Gait assistance Min assist  Gait Distance (Feet) 8 Feet  Assistive device Rolling walker (2 wheeled)  Gait Pattern/deviations Step-to  pattern;Decreased stride length;Wide base of support;Decreased weight shift to left  General Gait Details assisted to step to chair and cued her to adjust LLE to allow sitting but still screams  Gait velocity decreased  Balance  Overall balance assessment Needs assistance  Sitting-balance support Feet supported  Sitting balance-Leahy Scale Fair  Standing balance support Bilateral upper extremity supported  Standing balance-Leahy Scale Poor  Exercises  Exercises Total Joint  Total Joint Exercises  Ankle Circles/Pumps AROM;5 reps  Quad Sets AROM;10 reps  Gluteal Sets AROM;10 reps  Knee Flexion AROM;AAROM;10 reps  PT - End of Session  Activity Tolerance Patient limited by pain  Patient left with call bell/phone within reach;in chair;with chair alarm set  Nurse Communication Mobility status   PT - Assessment/Plan  PT Plan Current plan remains appropriate  PT Visit Diagnosis Other abnormalities of gait and mobility (R26.89);Difficulty in walking, not elsewhere classified (R26.2);Pain  Pain - Right/Left Left  Pain - part of body Hip  PT Frequency (ACUTE ONLY) 7X/week  Recommendations for Other Services OT consult  Follow Up Recommendations Home health PT;Supervision/Assistance - 24 hour  PT equipment 3in1 (PT)  AM-PAC PT "6 Clicks" Mobility Outcome Measure (Version 2)  Help needed turning from your back to your side while in a flat bed without using bedrails? 3  Help needed moving from lying on your back to sitting on the side of a flat bed without using bedrails? 2  Help needed moving to and from a bed to a chair (including a wheelchair)? 2  Help needed standing up from a chair using your arms (e.g., wheelchair  or bedside chair)? 2  Help needed to walk in hospital room? 2  Help needed climbing 3-5 steps with a railing?  2  6 Click Score 13  Consider Recommendation of Discharge To: CIR/SNF/LTACH  PT Goal Progression  Progress towards PT goals Progressing toward goals  PT Time  Calculation  PT Start Time (ACUTE ONLY) 1514  PT Stop Time (ACUTE ONLY) 1545  PT Time Calculation (min) (ACUTE ONLY) 31 min  PT General Charges  $$ ACUTE PT VISIT 1 Visit  PT Treatments  $Gait Training 8-22 mins  $Therapeutic Activity 8-22 mins    Mee Hives, PT MS Acute Rehab Dept. Number: [place] and [place]

## 2019-12-28 NOTE — TOC Initial Note (Signed)
Transition of Care [facility]) - Initial/Assessment Note    Patient Details  Name: Carolyn Rowland MRN: [id number] Date of Birth: [birth year]  [facility] CM/SW Contact:    Curlene Labrum, RN Phone Number: 12/28/2019, 3:12 PM  Clinical Narrative:                 Case management met with the patient S/P Left total hip arthroplasty - anterior.  The patient's daughter lives with the patient at home.  The patient was offered Medicare choice regarding home health and dme - patient did not have a preference.  Called [facility] and spoke with [place] - waiting on return call for PT/OT services.  Called [employer] and ordered 3:1 to be delivered to the room.  Will continue to follow and determine accepting home health agency.  Expected Discharge Plan: [place] Barriers to Discharge: Continued Medical Work up   Patient Goals and CMS Choice Patient states their goals for this hospitalization and ongoing recovery are:: Patient states that she ready to get better. CMS Medicare.gov Compare Post Acute Care list provided to:: Patient Choice offered to / list presented to : Patient  Expected Discharge Plan and Services Expected Discharge Plan: [place]   Discharge Planning Services: CM Consult Post Acute Care Choice: Home Health, Durable Medical Equipment Living arrangements for the past 2 months: Single Family Home                 DME Arranged: 3-N-1 DME Agency: [employer] Date DME Agency Contacted: 12/28/19 Time DME Agency Contacted: [phone] Representative spoke with at DME Agency: [place]: PT, OT [place] Agency: [facility] [place] (Waiting for return call from [place] for PT/OT) Date [place]: 12/28/19 Time [place]: [place] Representative spoke with at [place]: Lauretta Chester - Waiting on return call back.  Prior Living Arrangements/Services Living arrangements for the past 2 months: Single Family Home Lives with::  Adult Children Patient language and need for interpreter reviewed:: Yes Do you feel safe going back to the place where you live?: Yes      Need for Family Participation in Patient Care: Yes (Comment) Care giver support system in place?: Yes (comment) Current home services: DME Criminal Activity/Legal Involvement Pertinent to Current Situation/Hospitalization: No - Comment as needed  Activities of Daily Living Home Assistive Devices/Equipment: Cane (specify quad or straight) ADL Screening (condition at time of admission) Patient's cognitive ability adequate to safely complete daily activities?: Yes Is the patient deaf or have difficulty hearing?: No Does the patient have difficulty seeing, even when wearing glasses/contacts?: No Does the patient have difficulty concentrating, remembering, or making decisions?: No Patient able to express need for assistance with ADLs?: Yes Does the patient have difficulty dressing or bathing?: No Independently performs ADLs?: Yes (appropriate for developmental age) Does the patient have difficulty walking or climbing stairs?: No Weakness of Legs: Left Weakness of Arms/Hands: None  Permission Sought/Granted Permission sought to share information with : Case Manager Permission granted to share information with : Yes, Verbal Permission Granted     Permission granted to share info w AGENCY: Home health and dme  Permission granted to share info w Relationship: family     Emotional Assessment Appearance:: Appears stated age Attitude/Demeanor/Rapport: Engaged Affect (typically observed): Accepting Orientation: : Oriented to Self, Oriented to Place, Oriented to  Time, Oriented to Situation Alcohol / Substance Use: Not Applicable Psych Involvement: No (comment)  Admission diagnosis:  H/O  total hip arthroplasty, left [C61.901] H/O total hip arthroplasty [Z96.649] Patient Active Problem List   Diagnosis Date Noted  . H/O total hip arthroplasty 12/27/2019   . H/O total hip arthroplasty, left 12/26/2019  . Unilateral primary osteoarthritis, left hip   . Arthritis of right hip 12/19/2017  . Spinal stenosis of lumbosacral region 09/22/2017  . Bilateral primary osteoarthritis of hip 09/22/2017  . CAP (community acquired pneumonia) 09/04/2014  . Breast cancer of upper-outer quadrant of right female breast ([place]) 08/27/2014  . Coronary atherosclerosis of native coronary artery 08/18/2013  . S/P PTCA (percutaneous transluminal coronary angioplasty) 08/17/2013   PCP:  Fanny Bien, MD Pharmacy:   [id number] [place] [id number]: [phone]: [phone]    Social Determinants of Health (SDOH) Interventions    Readmission Risk Interventions Readmission Risk Prevention Plan 12/28/2019  Post Dischage Appt Complete  Medication Screening Complete  Transportation Screening Complete  Some recent data might be hidden

## 2019-12-28 NOTE — Progress Notes (Addendum)
Physical Therapy Treatment Patient Details Name: Carolyn Rowland MRN: [id number] DOB: [birth year] Today's Date: 12/28/2019    History of Present Illness Pt is a 79 y.o. F with significant PMH of breast cancer, right THA who presents s/p left total hip arthroplasty, direct anterior approach.     PT Comments    Pt was seen for there ex on bed as she is nauseated and has just been given Zofran by nursing.  Follow up with gait this afternoon.  Has high sensitivity to movement and even touch, very strong reaction with flexion of the LLE.  Pt was repositioned on LLE to decrease heel pressure and ice given to pt for comfort.  Follow Up Recommendations  Home health PT;Supervision/Assistance - 24 hour     Equipment Recommendations  3in1 (PT)    Recommendations for Other Services OT consult     Precautions / Restrictions Precautions Precautions: Fall Restrictions Weight Bearing Restrictions: Yes LLE Weight Bearing: Weight bearing as tolerated    Mobility  Bed Mobility Overal bed mobility: Needs Assistance             General bed mobility comments: minor adjustment on the bed due to nausea  Transfers                 General transfer comment: declined  Ambulation/Gait             General Gait Details: declined   Stairs             Wheelchair Mobility    Modified Rankin (Stroke Patients Only)       Balance                                            Cognition Arousal/Alertness: Awake/alert Behavior During Therapy: WFL for tasks assessed/performed Overall Cognitive Status: Within Functional Limits for tasks assessed                                        Exercises Total Joint Exercises Ankle Circles/Pumps: AROM;5 reps Quad Sets: AROM;10 reps Gluteal Sets: AROM;10 reps Heel Slides: AROM;AAROM;10 reps Hip ABduction/ADduction: AROM;AAROM;10 reps Straight Leg Raises: AROM;AAROM;10 reps Knee Flexion:  AROM;AAROM;10 reps    General Comments        Pertinent Vitals/Pain Pain Assessment: Faces Faces Pain Scale: Hurts even more Pain Location: L hip with movement Pain Descriptors / Indicators: Guarding;Operative site guarding Pain Intervention(s): Limited activity within patient's tolerance;Monitored during session;Premedicated before session;Repositioned;Ice applied    Home Living                      Prior Function            PT Goals (current goals can now be found in the care plan section) Acute Rehab PT Goals Patient Stated Goal: Less pain    Frequency    7X/week      PT Plan Current plan remains appropriate    Co-evaluation              AM-PAC PT "6 Clicks" Mobility   Outcome Measure  Help needed turning from your back to your side while in a flat bed without using bedrails?: A Little Help needed moving from lying on your back to sitting on the side of a  flat bed without using bedrails?: A Lot Help needed moving to and from a bed to a chair (including a wheelchair)?: A Lot Help needed standing up from a chair using your arms (e.g., wheelchair or bedside chair)?: A Lot Help needed to walk in hospital room?: A Lot Help needed climbing 3-5 steps with a railing? : A Lot 6 Click Score: 13    End of Session   Activity Tolerance: Patient limited by pain Patient left: with call bell/phone within reach;in bed;with bed alarm set Nurse Communication: Mobility status PT Visit Diagnosis: Other abnormalities of gait and mobility (R26.89);Difficulty in walking, not elsewhere classified (R26.2);Pain Pain - Right/Left: Left Pain - part of body: Hip     Time: 2103-1281 PT Time Calculation (min) (ACUTE ONLY): 26 min  Charges:  $Therapeutic Exercise: 8-22 mins $Therapeutic Activity: 8-22 mins                    Ramond Dial 12/28/2019, 9:51 PM   Mee Hives, PT MS Acute Rehab Dept. Number: [place] and [place]

## 2019-12-29 LAB — CBC
HCT: 29.1 % — ABNORMAL LOW (ref 36.0–46.0)
Hemoglobin: 9.9 g/dL — ABNORMAL LOW (ref 12.0–15.0)
MCH: 27.9 pg (ref 26.0–34.0)
MCHC: 34 g/dL (ref 30.0–36.0)
MCV: 82 fL (ref 80.0–100.0)
Platelets: 124 10*3/uL — ABNORMAL LOW (ref 150–400)
RBC: 3.55 MIL/uL — ABNORMAL LOW (ref 3.87–5.11)
RDW: 14.6 % (ref 11.5–15.5)
WBC: 9.5 10*3/uL (ref 4.0–10.5)
nRBC: 0 % (ref 0.0–0.2)

## 2019-12-29 MED ORDER — OXYCODONE HCL 5 MG PO TABS: 5.0000 mg | ORAL_TABLET | Freq: Four times a day (QID) | ORAL | 0 refills | Status: DC | PRN

## 2019-12-29 MED ORDER — BISACODYL 10 MG RE SUPP
10.0000 mg | Freq: Every day | RECTAL | Status: DC | PRN
  Administered 2019-12-29: 10 mg via RECTAL
  Filled 2019-12-29: qty 1

## 2019-12-29 NOTE — Progress Notes (Signed)
Physical Therapy Treatment Patient Details Name: Carolyn Rowland MRN: [id number] DOB: 1940-09-16 Today's Date: 12/29/2019    History of Present Illness Pt is a 79 y.o. F with significant PMH of breast cancer, right THA who presents s/p left total hip arthroplasty, direct anterior approach.     PT Comments    Pt seated in recliner.  Focused on progressing gt this pm.  Pt reports need to go to bathroom post session so left on commode to have BM.  Plan for stair training next session.     Follow Up Recommendations  Home health PT;Supervision/Assistance - 24 hour     Equipment Recommendations  3in1 (PT)    Recommendations for Other Services OT consult     Precautions / Restrictions Precautions Precautions: Fall Restrictions Weight Bearing Restrictions: Yes LLE Weight Bearing: Weight bearing as tolerated    Mobility  Bed Mobility            General bed mobility comments: Pt seated in recliner on arrival this session.  Transfers Overall transfer level: Needs assistance Equipment used: Rolling walker (2 wheeled) Transfers: Sit to/from Stand Sit to Stand: Min guard        General transfer comment: Cues for hand placement and forward weight shifting.  Increased time to rise into standing.  Appears to be internally distracted.  Ambulation/Gait Ambulation/Gait assistance: Min assist Gait Distance (Feet): 30 Feet Assistive device: Rolling walker (2 wheeled) Gait Pattern/deviations: Step-to pattern;Decreased stride length;Wide base of support;Decreased weight shift to left Gait velocity: decreased   General Gait Details: Assistance for weight shifting and RW advancement.  Cues for upper trunk control and sequencing.  Continues to require several standing rest breaks with slow cadence.  Pt resting elbows on RW for support educated this was not safe practice.   Stairs             Wheelchair Mobility    Modified Rankin (Stroke Patients Only)       Balance  Overall balance assessment: Needs assistance Sitting-balance support: Feet supported Sitting balance-Leahy Scale: Fair     Standing balance support: Bilateral upper extremity supported Standing balance-Leahy Scale: Poor                              Cognition Arousal/Alertness: Awake/alert Behavior During Therapy: Anxious Overall Cognitive Status: Within Functional Limits for tasks assessed                                 General Comments: Continues to require max cues for encouragement      Exercises    General Comments        Pertinent Vitals/Pain Pain Assessment: 0-10 Pain Score: 7  Faces Pain Scale: Hurts whole lot Pain Location: L hip with movement Pain Descriptors / Indicators: Guarding;Operative site guarding Pain Intervention(s): Monitored during session;Repositioned    Home Living                      Prior Function            PT Goals (current goals can now be found in the care plan section) Acute Rehab PT Goals Patient Stated Goal: Less pain Potential to Achieve Goals: Good Progress towards PT goals: Progressing toward goals    Frequency    7X/week      PT Plan Current plan remains appropriate    Co-evaluation  AM-PAC PT "6 Clicks" Mobility   Outcome Measure  Help needed turning from your back to your side while in a flat bed without using bedrails?: A Little Help needed moving from lying on your back to sitting on the side of a flat bed without using bedrails?: A Little Help needed moving to and from a bed to a chair (including a wheelchair)?: A Little Help needed standing up from a chair using your arms (e.g., wheelchair or bedside chair)?: A Little Help needed to walk in hospital room?: A Little Help needed climbing 3-5 steps with a railing? : A Lot 6 Click Score: 17    End of Session Equipment Utilized During Treatment: Gait belt Activity Tolerance: Patient limited by pain Patient  left: with call bell/phone within reach;in bathroom; instruction to pull string when finished.  Nurse Communication: Mobility status PT Visit Diagnosis: Other abnormalities of gait and mobility (R26.89);Difficulty in walking, not elsewhere classified (R26.2);Pain Pain - Right/Left: Left Pain - part of body: Hip     Time: 2574-9355 PT Time Calculation (min) (ACUTE ONLY): 17 min  Charges:  $Gait Training: 8-22 mins $Therapeutic Exercise: 8-22 mins                     Erasmo Leventhal , PTA Acute Rehabilitation Services Pager [phone] Office [phone]     Carolyn Rowland 12/29/2019, 4:34 PM

## 2019-12-29 NOTE — Progress Notes (Signed)
Physical Therapy Treatment Patient Details Name: Carolyn Rowland MRN: [id number] DOB: Nov 04, 1940 Today's Date: 12/29/2019    History of Present Illness Pt is a 79 y.o. F with significant PMH of breast cancer, right THA who presents s/p left total hip arthroplasty, direct anterior approach.     PT Comments    Pt supine in bed on arrival this session.  Pt very anxious and required constant encouragement throughout session to mobilize.  Pt is progressing slowly still.  Will f/u in pm for progression of functional mobility and gt distance.     Follow Up Recommendations  Home health PT;Supervision/Assistance - 24 hour     Equipment Recommendations  3in1 (PT)    Recommendations for Other Services OT consult     Precautions / Restrictions Precautions Precautions: Fall Restrictions Weight Bearing Restrictions: Yes LLE Weight Bearing: Weight bearing as tolerated    Mobility  Bed Mobility Overal bed mobility: Needs Assistance Bed Mobility: Supine to Sit     Supine to sit: Min assist     General bed mobility comments: Min assistance to advance LLE to edge of bed and elevate trunk into seated position.  Utilized PTA as railing.  Increased time and effort with heavy use of rails.  Transfers Overall transfer level: Needs assistance Equipment used: Rolling walker (2 wheeled) Transfers: Sit to/from Stand Sit to Stand: Min guard         General transfer comment: Cues for hand placement to and from seated surface.  Performed from elevated edge of bed and commode chair with armrests.  Ambulation/Gait Ambulation/Gait assistance: Mod assist Gait Distance (Feet): 15 Feet Assistive device: Rolling walker (2 wheeled) Gait Pattern/deviations: Step-to pattern;Decreased stride length;Wide base of support;Decreased weight shift to left     General Gait Details: Assistance for weight shifting and RW advancement.  Cues for upper trunk control and sequencing.   Stairs              Wheelchair Mobility    Modified Rankin (Stroke Patients Only)       Balance Overall balance assessment: Needs assistance Sitting-balance support: Feet supported Sitting balance-Leahy Scale: Fair     Standing balance support: Bilateral upper extremity supported Standing balance-Leahy Scale: Poor                              Cognition Arousal/Alertness: Awake/alert Behavior During Therapy: Anxious Overall Cognitive Status: Within Functional Limits for tasks assessed                                 General Comments: Pt continues to be limited due to anxiety she expects pain before she tries to move and requires encouragement to engage in active movement to avoid pain of therapist assisting limb.      Exercises Total Joint Exercises Ankle Circles/Pumps: AROM;Both;20 reps;Supine Quad Sets: AROM;Both;10 reps;Supine Heel Slides: Left;10 reps;Supine;AAROM Hip ABduction/ADduction: AAROM;Left;10 reps;Supine    General Comments        Pertinent Vitals/Pain Pain Assessment: Faces Faces Pain Scale: Hurts whole lot Pain Location: L hip with movement Pain Descriptors / Indicators: Guarding;Operative site guarding Pain Intervention(s): Monitored during session;Repositioned    Home Living                      Prior Function            PT Goals (current goals can  now be found in the care plan section) Acute Rehab PT Goals Patient Stated Goal: Less pain Potential to Achieve Goals: Good Progress towards PT goals: Progressing toward goals    Frequency    7X/week      PT Plan Current plan remains appropriate    Co-evaluation              AM-PAC PT "6 Clicks" Mobility   Outcome Measure  Help needed turning from your back to your side while in a flat bed without using bedrails?: A Little Help needed moving from lying on your back to sitting on the side of a flat bed without using bedrails?: A Lot Help needed moving to and  from a bed to a chair (including a wheelchair)?: A Lot Help needed standing up from a chair using your arms (e.g., wheelchair or bedside chair)?: A Lot Help needed to walk in hospital room?: A Lot Help needed climbing 3-5 steps with a railing? : A Lot 6 Click Score: 13    End of Session Equipment Utilized During Treatment: Gait belt Activity Tolerance: Patient limited by pain Patient left: with call bell/phone within reach;in chair;with chair alarm set Nurse Communication: Mobility status PT Visit Diagnosis: Other abnormalities of gait and mobility (R26.89);Difficulty in walking, not elsewhere classified (R26.2);Pain Pain - Right/Left: Left Pain - part of body: Hip     Time: 1220-1252 PT Time Calculation (min) (ACUTE ONLY): 32 min  Charges:  $Gait Training: 8-22 mins $Therapeutic Exercise: 8-22 mins                     Erasmo Leventhal , PTA Acute Rehabilitation Services Pager [phone] Office [phone]     Aimee Eli Hose 12/29/2019, 12:59 PM

## 2019-12-29 NOTE — Discharge Instructions (Signed)

## 2019-12-29 NOTE — Progress Notes (Signed)
   Subjective: 3 Days Post-Op Procedure(s) (LRB): TOTAL HIP ARTHROPLASTY-DIRECT ANTERIOR (Left) Patient reports pain as mild and moderate.    Objective: Vital signs in last 24 hours: Temp:  [98.4 F (36.9 C)-99.5 F (37.5 C)] 98.4 F (36.9 C) (06/26 1316) Pulse Rate:  [76-90] 87 (06/26 1316) Resp:  [16-17] 17 (06/26 1316) BP: (117-138)/(51-91) 125/58 (06/26 1316) SpO2:  [99 %-100 %] 99 % (06/26 1316)  Intake/Output from previous day: No intake/output data recorded. Intake/Output this shift: No intake/output data recorded.  Recent Labs    12/27/19 0420 12/28/19 0414 12/29/19 0432  HGB 10.6* 10.5* 9.9*   Recent Labs    12/28/19 0414 12/29/19 0432  WBC 8.9 9.5  RBC 3.73* 3.55*  HCT 30.4* 29.1*  PLT 118* 124*   Recent Labs    12/28/19 0414  NA 138  K 4.4  CL 106  CO2 23  BUN 15  CREATININE 1.08*  GLUCOSE 132*  CALCIUM 8.3*   No results for input(s): LABPT, INR in the last 72 hours.  Neurologically intact No results found.  Assessment/Plan: 3 Days Post-Op Procedure(s) (LRB): TOTAL HIP ARTHROPLASTY-DIRECT ANTERIOR (Left) Up with therapy, walked to desk in hall and back. Needs to be heavily encouraged otherwise she will stop and wants to sit down. She appreciates therapist who pushed her and did not let her quit she releted to me. Plan discharge Sunday after therapy.   Carolyn Rowland 12/29/2019, 3:41 PM

## 2019-12-30 NOTE — Plan of Care (Signed)

## 2019-12-30 NOTE — Progress Notes (Signed)
Subjective: 4 Days Post-Op Procedure(s) (LRB): TOTAL HIP ARTHROPLASTY-DIRECT ANTERIOR (Left) Patient reports pain as moderate.  States needs to work on steps before going home.   Objective: Vital signs in last 24 hours: Temp:  [98.4 F (36.9 C)-99.5 F (37.5 C)] 98.6 F (37 C) (06/27 0523) Pulse Rate:  [78-90] 78 (06/27 0523) Resp:  [16-17] 16 (06/27 0523) BP: (114-138)/(51-71) 114/65 (06/27 0523) SpO2:  [98 %-100 %] 98 % (06/27 0523)  Intake/Output from previous day: No intake/output data recorded. Intake/Output this shift: No intake/output data recorded.  Recent Labs    12/28/19 0414 12/29/19 0432  HGB 10.5* 9.9*   Recent Labs    12/28/19 0414 12/29/19 0432  WBC 8.9 9.5  RBC 3.73* 3.55*  HCT 30.4* 29.1*  PLT 118* 124*   Recent Labs    12/28/19 0414  NA 138  K 4.4  CL 106  CO2 23  BUN 15  CREATININE 1.08*  GLUCOSE 132*  CALCIUM 8.3*   No results for input(s): LABPT, INR in the last 72 hours.  Dorsiflexion/Plantar flexion intact Incision: scant drainage Compartment soft   Assessment/Plan: 4 Days Post-Op Procedure(s) (LRB): TOTAL HIP ARTHROPLASTY-DIRECT ANTERIOR (Left) Up with therapy Discharge home with home health      Erskine Emery 12/30/2019, 7:53 AM

## 2019-12-30 NOTE — Plan of Care (Signed)
  Problem: Education: Goal: Knowledge of General Education information will improve Description: Including pain rating scale, medication(s)/side effects and non-pharmacologic comfort measures 12/30/2019 1220 by Lurline Idol, RN Outcome: Completed/Met 12/30/2019 1019 by Lurline Idol, RN Outcome: Adequate for Discharge   Problem: Health Behavior/Discharge Planning: Goal: Ability to manage health-related needs will improve 12/30/2019 1220 by Lurline Idol, RN Outcome: Completed/Met 12/30/2019 1019 by Lurline Idol, RN Outcome: Adequate for Discharge   Problem: Clinical Measurements: Goal: Ability to maintain clinical measurements within normal limits will improve 12/30/2019 1220 by Lurline Idol, RN Outcome: Completed/Met 12/30/2019 1019 by Lurline Idol, RN Outcome: Adequate for Discharge Goal: Will remain free from infection 12/30/2019 1220 by Lurline Idol, RN Outcome: Completed/Met 12/30/2019 1019 by Lurline Idol, RN Outcome: Adequate for Discharge Goal: Diagnostic test results will improve 12/30/2019 1220 by Lurline Idol, RN Outcome: Completed/Met 12/30/2019 1019 by Lurline Idol, RN Outcome: Adequate for Discharge Goal: Respiratory complications will improve 12/30/2019 1220 by Lurline Idol, RN Outcome: Completed/Met 12/30/2019 1019 by Lurline Idol, RN Outcome: Adequate for Discharge Goal: Cardiovascular complication will be avoided 12/30/2019 1220 by Lurline Idol, RN Outcome: Completed/Met 12/30/2019 1019 by Lurline Idol, RN Outcome: Adequate for Discharge   Problem: Activity: Goal: Risk for activity intolerance will decrease 12/30/2019 1220 by Lurline Idol, RN Outcome: Completed/Met 12/30/2019 1019 by Lurline Idol, RN Outcome: Adequate for Discharge   Problem: Nutrition: Goal: Adequate nutrition will be maintained 12/30/2019 1220 by Lurline Idol, RN Outcome: Completed/Met 12/30/2019 1019 by Lurline Idol, RN Outcome: Adequate for Discharge    Problem: Coping: Goal: Level of anxiety will decrease 12/30/2019 1220 by Lurline Idol, RN Outcome: Completed/Met 12/30/2019 1019 by Lurline Idol, RN Outcome: Adequate for Discharge   Problem: Elimination: Goal: Will not experience complications related to bowel motility 12/30/2019 1220 by Lurline Idol, RN Outcome: Completed/Met 12/30/2019 1019 by Lurline Idol, RN Outcome: Adequate for Discharge Goal: Will not experience complications related to urinary retention 12/30/2019 1220 by Lurline Idol, RN Outcome: Completed/Met 12/30/2019 1019 by Lurline Idol, RN Outcome: Adequate for Discharge   Problem: Pain Managment: Goal: General experience of comfort will improve 12/30/2019 1220 by Lurline Idol, RN Outcome: Completed/Met 12/30/2019 1019 by Lurline Idol, RN Outcome: Adequate for Discharge   Problem: Safety: Goal: Ability to remain free from injury will improve 12/30/2019 1220 by Lurline Idol, RN Outcome: Completed/Met 12/30/2019 1019 by Lurline Idol, RN Outcome: Adequate for Discharge   Problem: Skin Integrity: Goal: Risk for impaired skin integrity will decrease 12/30/2019 1220 by Lurline Idol, RN Outcome: Completed/Met 12/30/2019 1019 by Lurline Idol, RN Outcome: Adequate for Discharge

## 2019-12-30 NOTE — Progress Notes (Signed)
Patient was told that she was gioing to be discharged and she arranged fro a family member to pick her up at 2pm.  The social worked talked to the patient and the patient said she would go to inpatient rehab and did not want to leave so she spoke to a doctor and told the patient she could stay another night.  Dr Lorin Mercy then called rn and wanted to speak to the patient and told nurse the patient needs to go home. The patient spoke to Dr Lorin Mercy and when they hung up she said he told her she could be discharged tomorrow.  When the social worker told the patient she could stay,the patient called her family and said not to pick her up.  Now there is nobody home to help the patient until tomorrow.

## 2019-12-30 NOTE — Progress Notes (Signed)
Patient discharged home.  She has a walker at home and she is taking a bedside commode with her that was ordered by Education officer, museum.  Patients sister is coming at 2

## 2019-12-30 NOTE — TOC Transition Note (Addendum)
Transition of Care [facility]) - CM/SW Discharge Note   Patient Details  Name: Carolyn Rowland MRN: [id number] Date of Birth: 1941/02/25  Transition of Care [facility]) CM/SW Contact:  Claudie Leach, RN 12/30/2019, 1:26 PM   Clinical Narrative:    Patient to d/c home with [facility] PT.  CM has exhausted all possibilities of [facility] agencies and no agency will accept patient.  The following agencies have declined patient: [facility], [facility], [facility], [facility], [facility], [facility], [facility], [facility], [facility], [place].  The following agencies are not available on the weekend: [facility], [facility].     D/w patient, who is reluctant to go home and said may consider going to SNF.  She states she is unable to get to outpatient PT.  Patient is willing to stay another night so that we can try to resolve [facility] issue tomorrow. D/W Barbette Or, TOC supervisor on call.  We may have some other options tomorrow that we do not have today.  I will sen [facility] referral through Epic and via email.  D/w Dr. Rush Farmer, MD on call.  He states patient should stay another night.   Addendum 1500: Dr. Lorin Mercy called and said patient can go home and he will ask office staff to help resolve [facility] tomorrow.  He will update patient and RN.   Final next level of care: [place] Barriers to Discharge: Continued Medical Work up   Patient Goals and CMS Choice Patient states their goals for this hospitalization and ongoing recovery are:: Patient states that she ready to get better. CMS Medicare.gov Compare Post Acute Care list provided to:: Patient Choice offered to / list presented to : Patient   Discharge Plan and Services   Discharge Planning Services: CM Consult Post Acute Care Choice: Home Health, Durable Medical Equipment          DME Arranged: 3-N-1 DME Agency: [employer] Date DME Agency Contacted: 12/28/19 Time DME Agency Contacted: [id number] Representative spoke with at DME Agency: Thedore Mins Time Lely: [id number]    Readmission Risk Interventions Readmission Risk Prevention Plan 12/28/2019  Post Dischage Appt Complete  Medication Screening Complete  Transportation Screening Complete  Some recent data might be hidden

## 2019-12-30 NOTE — Progress Notes (Signed)
Physical Therapy Treatment Patient Details Name: Carolyn Rowland MRN: [id number] DOB: 02-14-1941 Today's Date: 12/30/2019    History of Present Illness Pt is a 79 y.o. F with significant PMH of breast cancer, right THA who presents s/p left total hip arthroplasty, direct anterior approach.     PT Comments    Continuing work on functional mobility and activity tolerance;  Session focused on progressive amb and stair training; Notable improvements in gait and able ascend/descend 2 steps with min assist; discussed car transfers; Questions solicited and answered; OK for dc home from PT standpoint; Notified Juliann Pulse, RN   Follow Up Recommendations  Follow surgeon's recommendation for DC plan and follow-up therapies     Equipment Recommendations  3in1 (PT)    Recommendations for Other Services       Precautions / Restrictions Precautions Precautions: Fall Precaution Comments: Fall risk greatly reduced with use of RW Restrictions LLE Weight Bearing: Weight bearing as tolerated    Mobility  Bed Mobility Overal bed mobility: Needs Assistance Bed Mobility: Supine to Sit     Supine to sit: Min guard     General bed mobility comments: Very slow moving, and took a few rest breaks; very slow and guarded moving LLE off of bed (practiced with crook of cane on foot to assist); used rail to initially pull trunk up; slow, but ultimately, did not need physical assist  Transfers Overall transfer level: Needs assistance Equipment used: Rolling walker (2 wheeled) Transfers: Sit to/from Stand Sit to Stand: Min guard         General transfer comment: Cues for hand placement and safety; stood from bed and 3in1 commode in bathroom  Ambulation/Gait Ambulation/Gait assistance: Min guard Gait Distance (Feet): 50 Feet Assistive device: Rolling walker (2 wheeled) Gait Pattern/deviations: Step-to pattern;Decreased stride length;Wide base of support;Decreased weight shift to left Gait velocity:  decreased   General Gait Details: Initially with difficulty weight shifting to [place] R LE to advance, but this improved with practice; cues for posture; Did not need to rest elbows on RW for rest this session   Stairs Stairs: Yes Stairs assistance: Min assist Stair Management: One rail Right;Step to pattern;Forwards;With cane (and with arm around PT) Number of Stairs: 2 General stair comments: Demonstrated several options for support with going up and down her steps with RW; She used the cane in her L hand and rail on her R to ascend, and then rail L hand and R arm on PT's shoulder for more support with descending; slow moving, but able   Wheelchair Mobility    Modified Rankin (Stroke Patients Only)       Balance     Sitting balance-Leahy Scale: Fair       Standing balance-Leahy Scale: Poor (approaching Fair)                              Cognition Arousal/Alertness: Awake/alert Behavior During Therapy: WFL for tasks assessed/performed Overall Cognitive Status: Within Functional Limits for tasks assessed                                 General Comments: Her anxiety is improving; Highlighted her successes at end of session      Exercises      General Comments General comments (skin integrity, edema, etc.): Seemed encouraged with her progress      Pertinent Vitals/Pain Pain Assessment: Faces  Faces Pain Scale: Hurts little more Pain Location: L hip with movement Pain Descriptors / Indicators: Guarding;Operative site guarding Pain Intervention(s): Monitored during session;Premedicated before session;Repositioned;Ice applied    Home Living                      Prior Function            PT Goals (current goals can now be found in the care plan section) Acute Rehab PT Goals Patient Stated Goal: Walk without pain PT Goal Formulation: With patient Time For Goal Achievement: 01/10/20 Potential to Achieve Goals: Good Progress  towards PT goals: Progressing toward goals    Frequency    7X/week      PT Plan Current plan remains appropriate    Co-evaluation              AM-PAC PT "6 Clicks" Mobility   Outcome Measure  Help needed turning from your back to your side while in a flat bed without using bedrails?: A Little Help needed moving from lying on your back to sitting on the side of a flat bed without using bedrails?: A Little Help needed moving to and from a bed to a chair (including a wheelchair)?: None Help needed standing up from a chair using your arms (e.g., wheelchair or bedside chair)?: None Help needed to walk in hospital room?: None Help needed climbing 3-5 steps with a railing? : A Little 6 Click Score: 21    End of Session Equipment Utilized During Treatment: Gait belt Activity Tolerance: Patient tolerated treatment well;No increased pain Patient left: in chair;with call bell/phone within reach Nurse Communication: Mobility status PT Visit Diagnosis: Other abnormalities of gait and mobility (R26.89);Difficulty in walking, not elsewhere classified (R26.2);Pain Pain - Right/Left: Left Pain - part of body: Hip     Time: 0017-4944 PT Time Calculation (min) (ACUTE ONLY): 45 min  Charges:  $Gait Training: 23-37 mins $Therapeutic Activity: 8-22 mins                     Roney Marion, PT  Acute Rehabilitation Services Pager [phone] Office [place] 12/30/2019, 9:15 AM

## 2019-12-31 NOTE — Progress Notes (Signed)
Physical Therapy Treatment Patient Details Name: Carolyn Rowland MRN: [id number] DOB: 10/09/1940 Today's Date: 12/31/2019    History of Present Illness Pt is a 79 y.o. F with significant PMH of breast cancer, right THA who presents s/p left total hip arthroplasty, direct anterior approach.     PT Comments    Pt reclined in chair on arrival.  Focused on stair training this pm.  Pt pushed in recliner to stair trainer.  Pt performed x4 stairs.  Pt awaiting d/c home this pm.    Follow Up Recommendations  Follow surgeon's recommendation for DC plan and follow-up therapies     Equipment Recommendations  3in1 (PT)    Recommendations for Other Services       Precautions / Restrictions Precautions Precautions: Fall Precaution Comments: Fall risk greatly reduced with use of RW Restrictions Weight Bearing Restrictions: Yes LLE Weight Bearing: Weight bearing as tolerated    Mobility  Bed Mobility               General bed mobility comments: Seated in recliner upon entry  Transfers Overall transfer level: Needs assistance Equipment used: Rolling walker (2 wheeled) Transfers: Sit to/from Stand Sit to Stand: Supervision         General transfer comment: Cues for hand placement.  Ambulation/Gait    Stairs Stairs: Yes Stairs assistance: Min assist Stair Management: One rail Right;Step to pattern;Forwards;Backwards Number of Stairs: 4 General stair comments: Cues for sequencing and use of unilateral rail for support.  Pt required assistance safety.  Forwards to ascend and backwards to descend.   Wheelchair Mobility    Modified Rankin (Stroke Patients Only)       Balance Overall balance assessment: Needs assistance Sitting-balance support: Feet supported Sitting balance-Leahy Scale: Good     Standing balance support: Bilateral upper extremity supported;During functional activity Standing balance-Leahy Scale: Fair                               Cognition Arousal/Alertness: Awake/alert Behavior During Therapy: WFL for tasks assessed/performed Overall Cognitive Status: Within Functional Limits for tasks assessed                                        Exercises     General Comments General comments (skin integrity, edema, etc.): Clean/dry dressing to L hip       Pertinent Vitals/Pain Pain Assessment: Faces Faces Pain Scale: Hurts little more Pain Location: L hip with movement Pain Descriptors / Indicators: Guarding;Operative site guarding Pain Intervention(s): Monitored during session    Home Living                      Prior Function            PT Goals (current goals can now be found in the care plan section) Acute Rehab PT Goals Patient Stated Goal: To return home.  Potential to Achieve Goals: Good Progress towards PT goals: Progressing toward goals    Frequency    7X/week      PT Plan Current plan remains appropriate    Co-evaluation              AM-PAC PT "6 Clicks" Mobility   Outcome Measure  Help needed turning from your back to your side while in a flat bed without using bedrails?: A Little  Help needed moving from lying on your back to sitting on the side of a flat bed without using bedrails?: A Little Help needed moving to and from a bed to a chair (including a wheelchair)?: None Help needed standing up from a chair using your arms (e.g., wheelchair or bedside chair)?: None Help needed to walk in hospital room?: None Help needed climbing 3-5 steps with a railing? : A Little 6 Click Score: 21    End of Session Equipment Utilized During Treatment: Gait belt Activity Tolerance: Patient tolerated treatment well;No increased pain Patient left: in chair;with call bell/phone within reach Nurse Communication: Mobility status PT Visit Diagnosis: Other abnormalities of gait and mobility (R26.89);Difficulty in walking, not elsewhere classified (R26.2);Pain Pain -  Right/Left: Left Pain - part of body: Hip     Time: 5825-1898 PT Time Calculation (min) (ACUTE ONLY): 12 min  Charges:  $Gait Training: 8-22 mins                     Erasmo Leventhal , PTA Acute Rehabilitation Services Pager [phone] Office [phone]     Aimee Eli Hose 12/31/2019, 2:49 PM

## 2019-12-31 NOTE — TOC Transition Note (Signed)
Transition of Care [facility]) - CM/SW Discharge Note   Patient Details  Name: Carolyn Rowland MRN: [id number] Date of Birth: [birth year]  Transition of Care [facility]) CM/SW Contact:  Curlene Labrum, RN Phone Number: 12/31/2019, 12:08 PM   Clinical Narrative:    Case management spoke with [facility] health and [facility] and these agencies were unable to provide home health for the patient.  I called and spoke with [facility] and they are accepting the patient for PT and will start services for the patient tomorrow.  The patient was informed and will be discharged today.  Final next level of care: [place] Barriers to Discharge: Continued Medical Work up   Patient Goals and CMS Choice Patient states their goals for this hospitalization and ongoing recovery are:: Patient states that she ready to get better. CMS Medicare.gov Compare Post Acute Care list provided to:: Patient Choice offered to / list presented to : Patient  Discharge Placement                       Discharge Plan and Services   Discharge Planning Services: CM Consult Post Acute Care Choice: Home Health, Durable Medical Equipment          DME Arranged: 3-N-1 DME Agency: [employer] Date DME Agency Contacted: 12/28/19 Time DME Agency Contacted: [id number] Representative spoke with at DME Agency: [place]: PT, OT [place] Agency: [facility] (Waiting for return call from [place] for PT/OT) Date [place]: 12/28/19 Time [place]: [place] Representative spoke with at [place]: Lauretta Chester - Waiting on return call back.  Social Determinants of Health (SDOH) Interventions     Readmission Risk Interventions Readmission Risk Prevention Plan 12/28/2019  Post Dischage Appt Complete  Medication Screening Complete  Transportation Screening Complete  Some recent data might be hidden

## 2019-12-31 NOTE — Progress Notes (Signed)
Discharge packet printed, instructions given to patient. Verbalizing understanding.

## 2019-12-31 NOTE — Consult Note (Signed)
   [facility] Inpatient Consult   12/28/6387  Carolyn Rowland [birth year] [id number]  [facility] [ACO] Patient: [name] referral received from Inpatient Transition of Care RNCM.  Patient is showing as low risk score for unplanned readmission score and had 1 hospitalization noted in the past 6 months.  However, patient was checked for difficulty obtaining home health care and requested [place] Management service for post hospital follow up needs.  Patient's daughter lives with the patient per inpatient [facility] RNCM notes. Review of patient's medical record reveals patient is s/p Left Total Hip Arthroplasty, listed as elective surgery.  Primary Care Provider is  Rachell Cipro, MD this provider is listed to provide the transition of care [TOC] for post hospital follow up.   Plan:  Messaged and follow up with inpatient [facility] team and patient has now home health care.  Will have [facility] RNCM follow up for post hospital progress.  For questions contact:   Natividad Brood, RN BSN [facility] Liaison  [phone] business mobile phone Toll free office [phone]  Fax number: [phone] [email or web address]

## 2019-12-31 NOTE — Addendum Note (Signed)
Addended by: Jiles Harold on: 12/31/2019 08:44 AM   Modules accepted: Level of Service, SmartSet

## 2019-12-31 NOTE — Plan of Care (Signed)
  Problem: Health Behavior/Discharge Planning: Goal: Ability to manage health-related needs will improve Outcome: Progressing   Problem: Nutrition: Goal: Adequate nutrition will be maintained Outcome: Progressing   Problem: Safety: Goal: Ability to remain free from injury will improve Outcome: Progressing   

## 2019-12-31 NOTE — Progress Notes (Addendum)
Occupational Therapy Treatment Patient Details Name: Carolyn Rowland MRN: [id number] DOB: [birth year] Today's Date: 12/31/2019    History of present illness Pt is a 79 y.o. F with significant PMH of breast cancer, right THA who presents s/p left total hip arthroplasty, direct anterior approach.    OT comments  Patient has made progress toward established POC with all goals adequate for d/c. Patient currently functioning at supervision to [facility] guard for all mobility and functional transfers with use of RW. Patient also requiring grossly Min guard to Min A for LB BADLs including bathing/dressing without use of AE. OT to sign off at this time. Continued recommendation for HHOT and initial 24 hour supervision/assist from family at d/c.    Follow Up Recommendations  Home health OT;Supervision/Assistance - 24 hour    Equipment Recommendations  3 in 1 bedside commode    Recommendations for Other Services      Precautions / Restrictions Precautions Precautions: Fall Precaution Comments: Fall risk greatly reduced with use of RW Restrictions Weight Bearing Restrictions: Yes LLE Weight Bearing: Weight bearing as tolerated       Mobility Bed Mobility               General bed mobility comments: Seated in recliner upon entry  Transfers Overall transfer level: Needs assistance Equipment used: Rolling walker (2 wheeled) Transfers: Sit to/from [facility] Sit to Stand: Supervision         General transfer comment: Sit to stand from recliner to RW, STS from slightly elevated EOB, and STS from [facility] over toilet with supervision A.     Balance Overall balance assessment: Needs assistance Sitting-balance support: Feet supported Sitting balance-Leahy Scale: Good     Standing balance support: Bilateral upper extremity supported;During functional activity Standing balance-Leahy Scale: Fair                             ADL either performed or assessed with  clinical judgement   ADL       Grooming: Supervision/safety;Standing               Lower Body Dressing: Minimal assistance;Sit to/from stand   Toilet Transfer: Min guard;Ambulation;RW Toilet Transfer Details (indicate cue type and reason): To BSC over toilet with cues for hand placement         Functional mobility during ADLs: Supervision/safety;Rolling walker       Vision       Perception     Praxis      Cognition Arousal/Alertness: Awake/alert Behavior During Therapy: WFL for tasks assessed/performed Overall Cognitive Status: Within Functional Limits for tasks assessed                                          Exercises Total Joint Exercises Ankle Circles/Pumps: AROM;Both;20 reps;Supine Quad Sets: AROM;10 reps;Supine;Left Heel Slides: Left;10 reps;Supine;AAROM Hip ABduction/ADduction: AAROM;Left;10 reps;Supine   Shoulder Instructions       General Comments Clean/dry dressing to L hip     Pertinent Vitals/ Pain       Pain Assessment: Faces Faces Pain Scale: Hurts little more Pain Location: L hip with movement Pain Descriptors / Indicators: Guarding;Operative site guarding Pain Intervention(s): Monitored during session;Premedicated before session  Home Living  Prior Functioning/Environment              Frequency  Min 2X/week        Progress Toward Goals  OT Goals(current goals can now be found in the care plan section)  Progress towards OT goals: Progressing toward goals  Acute Rehab OT Goals Patient Stated Goal: To return home.  OT Goal Formulation: With patient Time For Goal Achievement: 01/10/20 Potential to Achieve Goals: Good ADL Goals Pt Will Perform Lower Body Bathing: with min guard assist;with adaptive equipment;sit to/from stand Pt Will Perform Lower Body Dressing: with min guard assist;with adaptive equipment;sit to/from stand Pt Will Transfer to  Toilet: with min guard assist;ambulating Pt Will Perform Toileting - Clothing Manipulation and hygiene: with min guard assist;sit to/from stand Additional ADL Goal #1: Pt will complete bed mobility at min guard level to prepare for OOB ADLs.  Plan Discharge plan remains appropriate    Co-evaluation                 AM-PAC OT "6 Clicks" Daily Activity     Outcome Measure   Help from another person eating meals?: None Help from another person taking care of personal grooming?: None Help from another person toileting, which includes using toliet, bedpan, or urinal?: A Little Help from another person bathing (including washing, rinsing, drying)?: A Lot Help from another person to put on and taking off regular upper body clothing?: A Little Help from another person to put on and taking off regular lower body clothing?: A Little 6 Click Score: 19    End of Session Equipment Utilized During Treatment: Gait belt;Rolling walker  OT Visit Diagnosis: Unsteadiness on feet (R26.81);Pain   Activity Tolerance Patient tolerated treatment well   Patient Left in chair;with call bell/phone within reach   Nurse Communication          Time: 0865-7846 OT Time Calculation (min): 39 min  Charges: OT General Charges $OT Visit: 1 Visit OT Treatments $Self Care/Home Management : 38-52 mins  Aizza Santiago H. OTR/L Supplemental OT, Department of rehab services [phone]   Yoanna Jurczyk R H. 12/31/2019, 1:47 PM

## 2019-12-31 NOTE — Addendum Note (Signed)
Addended by: Jiles Harold on: 12/31/2019 08:43 AM   Modules accepted: Miquel Dunn

## 2019-12-31 NOTE — Progress Notes (Signed)
Physical Therapy Treatment Patient Details Name: Carolyn Rowland MRN: [id number] DOB: December 09, 1940 Today's Date: 12/31/2019    History of Present Illness Pt is a 79 y.o. F with significant PMH of breast cancer, right THA who presents s/p left total hip arthroplasty, direct anterior approach.     PT Comments    Pt seated in recliner on arrival.  Pt continues to be anxious at the thought of mobilizing but performed all functional mobility this session with supervision level of assistance.  Continue to follow during acute stay and will re-trial stairs this pm.  She is ready to d/c home from a mobility stand point.     Follow Up Recommendations  Follow surgeon's recommendation for DC plan and follow-up therapies     Equipment Recommendations  3in1 (PT)    Recommendations for Other Services       Precautions / Restrictions Precautions Precautions: Fall Precaution Comments: Fall risk greatly reduced with use of RW Restrictions Weight Bearing Restrictions: Yes LLE Weight Bearing: Weight bearing as tolerated    Mobility  Bed Mobility               General bed mobility comments: Pt seated in recliner on arrival.  Transfers Overall transfer level: Needs assistance Equipment used: Rolling walker (2 wheeled) Transfers: Sit to/from Stand Sit to Stand: Supervision         General transfer comment: Cues for hand placement to and from seated surface.  Pt slow to move into standing and return to sitting.  Ambulation/Gait Ambulation/Gait assistance: Supervision Gait Distance (Feet): 100 Feet Assistive device: Rolling walker (2 wheeled) Gait Pattern/deviations: Step-to pattern;Decreased stride length;Wide base of support;Decreased weight shift to left Gait velocity: decreased   General Gait Details: Pt with increased cadence but continues to require encouragement to advance gt distance.  Pt continues to have decreased confidence in her abilities but is moving well. x2 standing  rest periods.   Stairs             Wheelchair Mobility    Modified Rankin (Stroke Patients Only)       Balance Overall balance assessment: Needs assistance Sitting-balance support: Feet supported Sitting balance-Leahy Scale: Good       Standing balance-Leahy Scale: Fair                              Cognition Arousal/Alertness: Awake/alert Behavior During Therapy: Anxious Overall Cognitive Status: Within Functional Limits for tasks assessed                                        Exercises Total Joint Exercises Ankle Circles/Pumps: AROM;Both;20 reps;Supine Quad Sets: AROM;10 reps;Supine;Left Heel Slides: Left;10 reps;Supine;AAROM Hip ABduction/ADduction: AAROM;Left;10 reps;Supine    General Comments        Pertinent Vitals/Pain Pain Assessment: Faces Faces Pain Scale: Hurts even more Pain Location: L hip with movement Pain Descriptors / Indicators: Guarding;Operative site guarding Pain Intervention(s): Monitored during session;Repositioned;Ice applied    Home Living                      Prior Function            PT Goals (current goals can now be found in the care plan section) Acute Rehab PT Goals Patient Stated Goal: Walk without pain Potential to Achieve Goals: Good Progress towards PT  goals: Progressing toward goals    Frequency    7X/week      PT Plan Current plan remains appropriate    Co-evaluation              AM-PAC PT "6 Clicks" Mobility   Outcome Measure  Help needed turning from your back to your side while in a flat bed without using bedrails?: A Little Help needed moving from lying on your back to sitting on the side of a flat bed without using bedrails?: A Little Help needed moving to and from a bed to a chair (including a wheelchair)?: None Help needed standing up from a chair using your arms (e.g., wheelchair or bedside chair)?: None Help needed to walk in hospital room?:  None Help needed climbing 3-5 steps with a railing? : A Little 6 Click Score: 21    End of Session Equipment Utilized During Treatment: Gait belt Activity Tolerance: Patient tolerated treatment well;No increased pain Patient left: in chair;with call bell/phone within reach Nurse Communication: Mobility status PT Visit Diagnosis: Other abnormalities of gait and mobility (R26.89);Difficulty in walking, not elsewhere classified (R26.2);Pain Pain - Right/Left: Left Pain - part of body: Hip     Time: 4818-5631 PT Time Calculation (min) (ACUTE ONLY): 25 min  Charges:  $Gait Training: 8-22 mins $Therapeutic Exercise: 8-22 mins                     Erasmo Leventhal , PTA Acute Rehabilitation Services Pager [phone] Office [phone]     Icholas Irby Eli Hose 12/31/2019, 10:54 AM

## 2019-12-31 NOTE — Care Management Important Message (Signed)
Important Message  Patient Details  Name: Carolyn Rowland MRN: [id number] Date of Birth: [birth year]   Medicare Important Message Given:  Yes     Orbie Pyo 12/31/2019, 3:24 PM

## 2019-12-31 NOTE — Progress Notes (Signed)
This encounter was created in error - please disregard.

## 2020-01-01 DIAGNOSIS — M6281 Muscle weakness (generalized): Secondary | ICD-10-CM | POA: Diagnosis not present

## 2020-01-01 DIAGNOSIS — I10 Essential (primary) hypertension: Secondary | ICD-10-CM | POA: Diagnosis not present

## 2020-01-01 DIAGNOSIS — Z471 Aftercare following joint replacement surgery: Secondary | ICD-10-CM | POA: Diagnosis not present

## 2020-01-01 DIAGNOSIS — M4800 Spinal stenosis, site unspecified: Secondary | ICD-10-CM | POA: Diagnosis not present

## 2020-01-01 DIAGNOSIS — R269 Unspecified abnormalities of gait and mobility: Secondary | ICD-10-CM | POA: Diagnosis not present

## 2020-01-01 DIAGNOSIS — E785 Hyperlipidemia, unspecified: Secondary | ICD-10-CM | POA: Diagnosis not present

## 2020-01-01 DIAGNOSIS — Z96642 Presence of left artificial hip joint: Secondary | ICD-10-CM | POA: Diagnosis not present

## 2020-01-02 ENCOUNTER — Other Ambulatory Visit: Payer: Self-pay | Admitting: *Deleted

## 2020-01-02 NOTE — Patient Outreach (Signed)
[facility]) Care Management  3/38/2505  KARIMAH WINQUIST [birth year] [id number]   Referral Received 6/29 Initial Outreach 6/30  RN spoke with pt today concerning [facility] services and the purpose for today's call. Pt receptive and report her recent surgery. States [facility] will be the [place] and will visit tomorrow for services. Pt states she will inquire on her dressing changes at that time. Pt state she remains sore but improving. No major issues have occur since arriving home with a sister and son who are supportive but her daughter works. RN discuss available services once again with social workers and pharmacy with [facility] along with RN case manager to assist pt in prevention measures to lower the risk of readmission. Pt again remains receptive as Therapist, sports verify pt has transportation to all medical appointments and has obtained all her medications and taking according the recommendations. RN further offered to enroll pt into the [facility] program for ongoing follow up call to assist with managing her issues telephonically. Pt agreed to monthly follow up since [place] is involved. States she has review all her medications and decline another review of all medications. States her sister is bring by food and she has a son for transportation to the medical appointments. Pt described the operative site with "minimal swelling" as she continues to apply ice pack to the area to reduce ongoing swelling. Denies any pain or redness to or around the site. Discussed and generated a plan of care related to prevention measures along with goals and interventions. Pt receptive and requested possible meal services. RN offered to make a referral to [facility] social worker for assistance with this request.   Plan: Will send Successful & Welcome letter to pt along with a notification letter to pt's primary care provider of pt's disposition with [facility] services.  [facility] CM Care Plan Problem One     Most Recent Value  Care  Plan Problem One Recent hospitalization for left hip surgery  Role Documenting the Problem One Care Management Telephonic [place] for Problem One Active  THN Long Term Goal  Pt will not have a readmission within the next 90 days.  THN Long Term Goal Start Date 01/02/20  Interventions for Problem One Long Term Goal Will educate on following the post-op discharge orders with all limitations. Will verify [facility] is involved and who to call if acute events occur. Will stress the importance of safety to prevent risk of readmissions.  TH[facility] CM Short Term Goal #1  Adherence with medical appointments over the next 30 days.  THN CM Short Term Goal #1 Start Date 01/02/20  Interventions for Short Term Goal #1 Discussed all upcoming appointments and verified pt has suficent transporation with no delays. Pt aware if additional resources are needed to alert [facility] for available resources.  THN CM Short Term Goal #2  Pt will be receptive to the referral for [facility] social worker to assist with meal services post op discharge within the next 30 days.  TH[facility] CM Short Term Goal #2 Start Date 01/02/20  Interventions for Short Term Goal #2 Will offer to make a referral for social worker for post-op meal services. Discuss mobile meals, [facility] and other agencies for short and long term services. Will make a referral to [facility] social worker.      Raina Mina, RN Care Management Coordinator [place] Office [phone]

## 2020-01-03 DIAGNOSIS — I10 Essential (primary) hypertension: Secondary | ICD-10-CM | POA: Diagnosis not present

## 2020-01-03 DIAGNOSIS — E785 Hyperlipidemia, unspecified: Secondary | ICD-10-CM | POA: Diagnosis not present

## 2020-01-03 DIAGNOSIS — M6281 Muscle weakness (generalized): Secondary | ICD-10-CM | POA: Diagnosis not present

## 2020-01-03 DIAGNOSIS — Z96642 Presence of left artificial hip joint: Secondary | ICD-10-CM | POA: Diagnosis not present

## 2020-01-03 DIAGNOSIS — R269 Unspecified abnormalities of gait and mobility: Secondary | ICD-10-CM | POA: Diagnosis not present

## 2020-01-03 DIAGNOSIS — Z471 Aftercare following joint replacement surgery: Secondary | ICD-10-CM | POA: Diagnosis not present

## 2020-01-03 DIAGNOSIS — M4800 Spinal stenosis, site unspecified: Secondary | ICD-10-CM | POA: Diagnosis not present

## 2020-01-04 DIAGNOSIS — M6281 Muscle weakness (generalized): Secondary | ICD-10-CM | POA: Diagnosis not present

## 2020-01-04 DIAGNOSIS — E785 Hyperlipidemia, unspecified: Secondary | ICD-10-CM | POA: Diagnosis not present

## 2020-01-04 DIAGNOSIS — Z96642 Presence of left artificial hip joint: Secondary | ICD-10-CM | POA: Diagnosis not present

## 2020-01-04 DIAGNOSIS — M4800 Spinal stenosis, site unspecified: Secondary | ICD-10-CM | POA: Diagnosis not present

## 2020-01-04 DIAGNOSIS — I10 Essential (primary) hypertension: Secondary | ICD-10-CM | POA: Diagnosis not present

## 2020-01-04 DIAGNOSIS — Z471 Aftercare following joint replacement surgery: Secondary | ICD-10-CM | POA: Diagnosis not present

## 2020-01-04 DIAGNOSIS — R269 Unspecified abnormalities of gait and mobility: Secondary | ICD-10-CM | POA: Diagnosis not present

## 2020-01-08 DIAGNOSIS — R269 Unspecified abnormalities of gait and mobility: Secondary | ICD-10-CM | POA: Diagnosis not present

## 2020-01-08 DIAGNOSIS — Z96642 Presence of left artificial hip joint: Secondary | ICD-10-CM | POA: Diagnosis not present

## 2020-01-08 DIAGNOSIS — M6281 Muscle weakness (generalized): Secondary | ICD-10-CM | POA: Diagnosis not present

## 2020-01-08 DIAGNOSIS — I10 Essential (primary) hypertension: Secondary | ICD-10-CM | POA: Diagnosis not present

## 2020-01-08 DIAGNOSIS — E785 Hyperlipidemia, unspecified: Secondary | ICD-10-CM | POA: Diagnosis not present

## 2020-01-08 DIAGNOSIS — M4800 Spinal stenosis, site unspecified: Secondary | ICD-10-CM | POA: Diagnosis not present

## 2020-01-08 DIAGNOSIS — Z471 Aftercare following joint replacement surgery: Secondary | ICD-10-CM | POA: Diagnosis not present

## 2020-01-09 ENCOUNTER — Ambulatory Visit (INDEPENDENT_AMBULATORY_CARE_PROVIDER_SITE_OTHER): Payer: Medicare HMO

## 2020-01-09 ENCOUNTER — Encounter: Payer: Self-pay | Admitting: Orthopaedic Surgery

## 2020-01-09 ENCOUNTER — Ambulatory Visit (INDEPENDENT_AMBULATORY_CARE_PROVIDER_SITE_OTHER): Payer: Medicare HMO | Admitting: Orthopaedic Surgery

## 2020-01-09 VITALS — Ht 65.0 in | Wt 213.0 lb

## 2020-01-09 DIAGNOSIS — Z96642 Presence of left artificial hip joint: Secondary | ICD-10-CM | POA: Diagnosis not present

## 2020-01-09 NOTE — Progress Notes (Signed)
Post-Op Visit Note   Patient: Carolyn Rowland           Date of Birth: Dec 17, 1940           MRN: [id number] Visit Date: 01/09/2020 PCP: Fanny Bien, MD   Assessment & Plan: Patient's been slow with activity therapists coming out of her house.  Attempted aspiration with some hematoma and subtenons tissue was unsuccessful in obtaining any fluid.  She has a thick adipose layer.  Should continue walking and staples were left in until next week's visit for wound check.  Chief Complaint:  Chief Complaint  Patient presents with  . Left Hip - Routine Post Op    12/26/2019 Left THA   Visit Diagnoses:  1. Status post total hip replacement, left     Plan: Patient has some subcutaneous postop hematoma.  Attempted aspiration was unsuccessful.  Recheck 1 week staples were left in due to the subtendinous swelling in the hip.  She is walking better and is done some work with stairs with therapist.  She is very anxious and concerned she may fall and I encouraged her to continue walking with her walker.  Follow-Up Instructions: No follow-ups on file.   Orders:  Orders Placed This Encounter  Procedures  . XR HIP UNILAT W OR W/O PELVIS 2-3 VIEWS LEFT   No orders of the defined types were placed in this encounter.   Imaging: XR HIP UNILAT W OR W/O PELVIS 2-3 VIEWS LEFT  Result Date: 01/09/2020 2 view x-rays left hip shows even leg lengths with right total hip arthroplasty.  Left total hip arthroplasty shows no loosening or subsidence good position alignment. Impression: Satisfactory left total hip arthroplasty.  Staples are noted.  Slight subcutaneous fluid noted suggestive of subcutaneous hematoma.   PMFS History: Patient Active Problem List   Diagnosis Date Noted  . H/O total hip arthroplasty 12/27/2019  . H/O total hip arthroplasty, left 12/26/2019  . Unilateral primary osteoarthritis, left hip   . Arthritis of right hip 12/19/2017  . Spinal stenosis of lumbosacral region 09/22/2017   . Bilateral primary osteoarthritis of hip 09/22/2017  . CAP (community acquired pneumonia) 09/04/2014  . Breast cancer of upper-outer quadrant of right female breast ([place]) 08/27/2014  . Coronary atherosclerosis of native coronary artery 08/18/2013  . S/P PTCA (percutaneous transluminal coronary angioplasty) 08/17/2013   Past Medical History:  Diagnosis Date  . Anemia    in younger years  . Anginal pain ([place])   . Aortic stenosis    mild to moderate AS 07/2013; moderate AS 08/2019   . Arthritis    "hands; legs" (08/17/2013)  . CAP (community acquired pneumonia)   . Coronary artery disease    6 stents in 2015  . GERD (gastroesophageal reflux disease)   . H/O hiatal hernia   . Heart murmur   . High cholesterol   . Hx of adenomatous colonic polyps 08/15/2018  . Hypertension   . Pneumonia 1970's   "once"  . Radiation 11/28/14-01/02/15   Right breast 50 Gray  . Vertigo     Family History  Problem Relation Age of Onset  . Cancer Brother   . Colon cancer Brother   . Cancer Brother   . Heart attack Brother   . Esophageal cancer Neg Hx   . Stomach cancer Neg Hx   . Rectal cancer Neg Hx     Past Surgical History:  Procedure Laterality Date  . ABDOMINAL HYSTERECTOMY  1985  . APPENDECTOMY  1960  . BREAST LUMPECTOMY WITH NEEDLE LOCALIZATION AND AXILLARY SENTINEL LYMPH NODE BX Right 10/17/2014   Procedure: BREAST LUMPECTOMY WITH NEEDLE LOCALIZATION AND AXILLARY SENTINEL LYMPH NODE BX;  Surgeon: Jackolyn Confer, MD;  Location: [place];  Service: General;  Laterality: Right;  . CARDIAC CATHETERIZATION  08/07/2013  . COLONOSCOPY    . CORONARY ANGIOPLASTY WITH STENT PLACEMENT  08/17/2013   "6 stents"(08/17/2013)  . LEFT AND RIGHT HEART CATHETERIZATION WITH CORONARY ANGIOGRAM N/A 08/07/2013   Procedure: LEFT AND RIGHT HEART CATHETERIZATION WITH CORONARY ANGIOGRAM;  Surgeon: Laverda Page, MD;  [facility] LAB;  Service: Cardiovascular;  Laterality: N/A;  . PERCUTANEOUS CORONARY STENT  INTERVENTION (PCI-S) N/A 08/17/2013   Procedure: PERCUTANEOUS CORONARY STENT INTERVENTION (PCI-S);  Surgeon: Laverda Page, MD;  Location: [facility] CATH LAB;  Service: Cardiovascular;  Laterality: N/A;  . TOTAL HIP ARTHROPLASTY Right 12/19/2017   Procedure: RIGHT TOTAL HIP ARTHROPLASTY ANTERIOR APPROACH;  Surgeon: Marybelle Killings, MD;  Location: [place];  Service: Orthopedics;  Laterality: Right;  . TOTAL HIP ARTHROPLASTY Left 12/26/2019   Procedure: TOTAL HIP ARTHROPLASTY-DIRECT ANTERIOR;  Surgeon: Marybelle Killings, MD;  Location: [place];  Service: Orthopedics;  Laterality: Left;  . TUBAL LIGATION  1980   Social History   Occupational History  . Not on file  Tobacco Use  . Smoking status: Former Smoker    Packs/day: 1.00    Years: 52.00    Pack years: 52.00    Types: Cigarettes    Quit date: 04/04/2014    Years since quitting: 5.7  . Smokeless tobacco: Never Used  Vaping Use  . Vaping Use: Never used  Substance and Sexual Activity  . Alcohol use: No    Comment: 08/17/2013 "quit drinking at age 20; never did drink much"  . Drug use: No  . Sexual activity: Never

## 2020-01-10 ENCOUNTER — Other Ambulatory Visit: Payer: Self-pay | Admitting: *Deleted

## 2020-01-10 DIAGNOSIS — Z471 Aftercare following joint replacement surgery: Secondary | ICD-10-CM | POA: Diagnosis not present

## 2020-01-10 DIAGNOSIS — M4800 Spinal stenosis, site unspecified: Secondary | ICD-10-CM | POA: Diagnosis not present

## 2020-01-10 DIAGNOSIS — I10 Essential (primary) hypertension: Secondary | ICD-10-CM | POA: Diagnosis not present

## 2020-01-10 DIAGNOSIS — R269 Unspecified abnormalities of gait and mobility: Secondary | ICD-10-CM | POA: Diagnosis not present

## 2020-01-10 DIAGNOSIS — Z96642 Presence of left artificial hip joint: Secondary | ICD-10-CM | POA: Diagnosis not present

## 2020-01-10 DIAGNOSIS — M6281 Muscle weakness (generalized): Secondary | ICD-10-CM | POA: Diagnosis not present

## 2020-01-10 DIAGNOSIS — E785 Hyperlipidemia, unspecified: Secondary | ICD-10-CM | POA: Diagnosis not present

## 2020-01-10 NOTE — Patient Outreach (Signed)
[facility]) Care Management  0/09/5246  Carolyn Rowland [birth year] [id number]  CSW received referral for meal assistance on 01/03/2020.  CSW made contact with pt and confirmed her identity.  CSW introduced self, role and reason for call; meal assistance.  Per pt, she has received "2 boxes of frozen meals".   Per pt, these meals were provided through her insurance. Pt reports she has a daughter living with her who is working from home as well as pt's sister who comes in often to help.  Pt reports she is receiving HHPT from [facility] which is going well with her [facility] rehab.  Pt shared with CSW that she lost her husband in March; they were married for 58 years. "He was very sick".  Pt was focused on being her husbands caregiver and his needs and states; "after he passed away I decided to get my hip fixed".  CSW validated pt's role and dedication to caregiving for husband.  Pt has good family support from daughter and a sister- she has not driven post op yet and has family to help with rides to MD appointments (saw Ortho yesterday) and for groceries, meds, etc. CSW encouraged pt to continue to focus on her rehab and to contact CSW if needs arise.  CSW will sign off and advise PCP and [facility] team of above.   Eduard Clos, MSW, [place] Worker  [place] [phone]

## 2020-01-11 DIAGNOSIS — M6281 Muscle weakness (generalized): Secondary | ICD-10-CM | POA: Diagnosis not present

## 2020-01-11 DIAGNOSIS — E785 Hyperlipidemia, unspecified: Secondary | ICD-10-CM | POA: Diagnosis not present

## 2020-01-11 DIAGNOSIS — R269 Unspecified abnormalities of gait and mobility: Secondary | ICD-10-CM | POA: Diagnosis not present

## 2020-01-11 DIAGNOSIS — D649 Anemia, unspecified: Secondary | ICD-10-CM | POA: Diagnosis not present

## 2020-01-11 DIAGNOSIS — Z471 Aftercare following joint replacement surgery: Secondary | ICD-10-CM | POA: Diagnosis not present

## 2020-01-11 DIAGNOSIS — M4800 Spinal stenosis, site unspecified: Secondary | ICD-10-CM | POA: Diagnosis not present

## 2020-01-11 DIAGNOSIS — Z96642 Presence of left artificial hip joint: Secondary | ICD-10-CM | POA: Diagnosis not present

## 2020-01-11 DIAGNOSIS — K219 Gastro-esophageal reflux disease without esophagitis: Secondary | ICD-10-CM | POA: Diagnosis not present

## 2020-01-11 DIAGNOSIS — I10 Essential (primary) hypertension: Secondary | ICD-10-CM | POA: Diagnosis not present

## 2020-01-14 DIAGNOSIS — M4800 Spinal stenosis, site unspecified: Secondary | ICD-10-CM | POA: Diagnosis not present

## 2020-01-14 DIAGNOSIS — Z471 Aftercare following joint replacement surgery: Secondary | ICD-10-CM | POA: Diagnosis not present

## 2020-01-14 DIAGNOSIS — I10 Essential (primary) hypertension: Secondary | ICD-10-CM | POA: Diagnosis not present

## 2020-01-14 DIAGNOSIS — E785 Hyperlipidemia, unspecified: Secondary | ICD-10-CM | POA: Diagnosis not present

## 2020-01-14 DIAGNOSIS — M6281 Muscle weakness (generalized): Secondary | ICD-10-CM | POA: Diagnosis not present

## 2020-01-14 DIAGNOSIS — R269 Unspecified abnormalities of gait and mobility: Secondary | ICD-10-CM | POA: Diagnosis not present

## 2020-01-14 DIAGNOSIS — Z96642 Presence of left artificial hip joint: Secondary | ICD-10-CM | POA: Diagnosis not present

## 2020-01-15 ENCOUNTER — Other Ambulatory Visit: Payer: Self-pay | Admitting: *Deleted

## 2020-01-15 DIAGNOSIS — M6281 Muscle weakness (generalized): Secondary | ICD-10-CM | POA: Diagnosis not present

## 2020-01-15 DIAGNOSIS — I10 Essential (primary) hypertension: Secondary | ICD-10-CM | POA: Diagnosis not present

## 2020-01-15 DIAGNOSIS — Z96642 Presence of left artificial hip joint: Secondary | ICD-10-CM | POA: Diagnosis not present

## 2020-01-15 DIAGNOSIS — M4800 Spinal stenosis, site unspecified: Secondary | ICD-10-CM | POA: Diagnosis not present

## 2020-01-15 DIAGNOSIS — E785 Hyperlipidemia, unspecified: Secondary | ICD-10-CM | POA: Diagnosis not present

## 2020-01-15 DIAGNOSIS — Z471 Aftercare following joint replacement surgery: Secondary | ICD-10-CM | POA: Diagnosis not present

## 2020-01-15 DIAGNOSIS — R269 Unspecified abnormalities of gait and mobility: Secondary | ICD-10-CM | POA: Diagnosis not present

## 2020-01-15 NOTE — Discharge Summary (Signed)
Patient ID: MASHELLE BUSICK MRN: [id number] DOB/AGE: 77-Jun-1942 77 y.o.  Admit date: 12/26/2019 Discharge date: 12/31/2019 Admission Diagnoses:  Active Problems:   Unilateral primary osteoarthritis, left hip   H/O total hip arthroplasty, left   H/O total hip arthroplasty   Discharge Diagnoses:  Active Problems:   Unilateral primary osteoarthritis, left hip   H/O total hip arthroplasty, left   H/O total hip arthroplasty  status post Procedure(s): TOTAL HIP ARTHROPLASTY-DIRECT ANTERIOR  Past Medical History:  Diagnosis Date  . Anemia    in younger years  . Anginal pain ([place])   . Aortic stenosis    mild to moderate AS 07/2013; moderate AS 08/2019   . Arthritis    "hands; legs" (08/17/2013)  . CAP (community acquired pneumonia)   . Coronary artery disease    6 stents in 2015  . GERD (gastroesophageal reflux disease)   . H/O hiatal hernia   . Heart murmur   . High cholesterol   . Hx of adenomatous colonic polyps 08/15/2018  . Hypertension   . Pneumonia 1970's   "once"  . Radiation 11/28/14-01/02/15   Right breast 50 Gray  . Vertigo     Surgeries: Procedure(s): TOTAL HIP ARTHROPLASTY-DIRECT ANTERIOR on 12/26/2019   Consultants:   Discharged Condition: Improved  Hospital Course: CASIE STURGEON is an 79 y.o. female who was admitted 12/26/2019 for operative treatment of left hip arthritis. Patient failed conservative treatments (please see the history and physical for the specifics) and had severe unremitting pain that affects sleep, daily activities and work/hobbies. After pre-op clearance, the patient was taken to the operating room on 12/26/2019 and underwent  Procedure(s): TOTAL HIP ARTHROPLASTY-DIRECT ANTERIOR.    Patient was given perioperative antibiotics:  Anti-infectives (From admission, onward)   Start     Dose/Rate Route Frequency Ordered Stop   12/26/19 1030  vancomycin (VANCOCIN) IVPB 1000 mg/200 mL premix        1,000 mg 200 mL/hr over 60 Minutes Intravenous On  call to O.R. 12/26/19 1022 12/26/19 1209       Patient was given sequential compression devices and early ambulation to prevent DVT.   Patient benefited maximally from hospital stay and there were no complications. At the time of discharge, the patient was urinating/moving their bowels without difficulty, tolerating a regular diet, pain is controlled with oral pain medications and they have been cleared by PT/OT.   Recent vital signs: No data found.   Recent laboratory studies: No results for input(s): WBC, HGB, HCT, PLT, NA, K, CL, CO2, BUN, CREATININE, GLUCOSE, INR, CALCIUM in the last 72 hours.  Invalid input(s): PT, 2   Discharge Medications:   Allergies as of 12/31/2019      Reactions   Penicillins Rash   Has patient had a PCN reaction causing immediate rash, facial/tongue/throat swelling, SOB or lightheadedness with hypotension: No Has patient had a PCN reaction causing severe rash involving mucus membranes or skin necrosis: Unknown Has patient had a PCN reaction that required hospitalization: No Has patient had a PCN reaction occurring within the last 10 years: No If all of the above answers are "NO", then may proceed with Cephalosporin use.      Medication List    STOP taking these medications   acetaminophen 325 MG tablet Commonly known as: TYLENOL   traMADol 50 MG tablet Commonly known as: ULTRAM     TAKE these medications   alendronate 70 MG tablet Commonly known as: FOSAMAX TAKE 1 TABLET BY MOUTH ONCE  A WEEK. TAKE WITH A FULL GLASS OF WATER ON AN EMPTY STOMACH. What changed: See the new instructions.   amLODipine 2.5 MG tablet Commonly known as: NORVASC Take 2.5 mg by mouth daily.   anastrozole 1 MG tablet Commonly known as: ARIMIDEX Take 1 tablet (1 mg total) by mouth daily.   aspirin 81 MG EC tablet Take 81 mg by mouth daily.   CALCIUM & VIT D3 BONE HEALTH PO Take 1 tablet by mouth 2 (two) times a week.   Crestor 20 MG tablet Generic drug:  rosuvastatin Take 20 mg by mouth daily.   isosorbide mononitrate 60 MG 24 hr tablet Commonly known as: Imdur Take 1 tablet (60 mg total) by mouth daily.   metoprolol succinate 50 MG 24 hr tablet Commonly known as: TOPROL-XL Take 50 mg by mouth daily. Take with or immediately following a meal.   nitroGLYCERIN 0.4 MG SL tablet Commonly known as: Nitrostat Place 1 tablet (0.4 mg total) under the tongue every 5 (five) minutes as needed for chest pain.   omeprazole 40 MG capsule Commonly known as: PRILOSEC Take 40 mg by mouth daily as needed (acid reflux).   oxyCODONE 5 MG immediate release tablet Commonly known as: Roxicodone Take 1-2 tablets (5-10 mg total) by mouth every 6 (six) hours as needed. Post op pain   spironolactone 25 MG tablet Commonly known as: ALDACTONE Take 25 mg by mouth daily.   Vitamin D3 10 MCG (400 UNIT) Caps Take 400 Units by mouth daily.       Diagnostic Studies: DG Chest 2 View  Result Date: 12/19/2019 CLINICAL DATA:  Preoperative respiratory evaluation for hip replacement EXAM: CHEST - 2 VIEW COMPARISON:  12/09/2017 FINDINGS: The heart size and mediastinal contours are within normal limits. Both lungs are clear. The visualized skeletal structures are unremarkable. IMPRESSION: No active cardiopulmonary disease. Electronically Signed   By: Misty Stanley M.D.   On: 12/19/2019 11:33   DG C-Arm 1-60 Min  Result Date: 12/26/2019 CLINICAL DATA:  Left hip replacement EXAM: OPERATIVE LEFT HIP WITH PELVIS; DG C-ARM 1-60 MIN COMPARISON:  None. FLUOROSCOPY TIME:  Fluoroscopy Time:  18 seconds Radiation Exposure Index (if provided by the fluoroscopic device): 1.28 mGy Number of Acquired Spot Images: 2 FINDINGS: Left hip prosthesis is noted in satisfactory position. No acute fracture or dislocation is noted. Prior right hip prosthesis is noted as well. IMPRESSION: Status post hip replacement without acute abnormality. Electronically Signed   By: [name]   On:  12/26/2019 15:57   DG HIP OPERATIVE UNILAT W OR W/O PELVIS LEFT  Result Date: 12/26/2019 CLINICAL DATA:  Left hip replacement EXAM: OPERATIVE LEFT HIP WITH PELVIS; DG C-ARM 1-60 MIN COMPARISON:  None. FLUOROSCOPY TIME:  Fluoroscopy Time:  18 seconds Radiation Exposure Index (if provided by the fluoroscopic device): 1.28 mGy Number of Acquired Spot Images: 2 FINDINGS: Left hip prosthesis is noted in satisfactory position. No acute fracture or dislocation is noted. Prior right hip prosthesis is noted as well. IMPRESSION: Status post hip replacement without acute abnormality. Electronically Signed   By: [name]   On: 12/26/2019 15:57   XR HIP UNILAT W OR W/O PELVIS 2-3 VIEWS LEFT  Result Date: 01/09/2020 2 view x-rays left hip shows even leg lengths with right total hip arthroplasty.  Left total hip arthroplasty shows no loosening or subsidence good position alignment. Impression: Satisfactory left total hip arthroplasty.  Staples are noted.  Slight subcutaneous fluid noted suggestive of subcutaneous hematoma.  Discharge Instructions    AMB Referral to [place] Management   Complete by: As directed    Please assign to [place] Coordinator for complex care and disease management follow up calls and assess for further needs.  Questions please call:   Natividad Brood, RN BSN [facility] Liaison  [phone] business mobile phone Toll free office [phone]  Fax number: [phone] [email or web address]   Reason for consult: Hospital follow up referral   Diagnoses of: Other   Other Diagnosis: Left hip fracture, difficulty finding HH   Expected date of contact: 1-3 days (reserved for hospital discharges)       [place], [facility] Follow up.   Why: [facility] will be providing a 3:1 to your hospital room prior to discharge. Contact information: [address] [address] [phone]        Marybelle Killings, MD Follow up.   Specialty: Orthopedic Surgery Contact information: [place] [address] [phone]        [facility], [facility] Follow up.   Specialty: Home Health Services Why: [facility] will be providing Physical Therapy services starting 01/01/2020. Contact information: [address] [place] [address] [phone]               Discharge Plan:  discharge to home  Disposition:     Signed: Benjiman Core for Rodell Perna MD 01/15/2020, 3:28 PM

## 2020-01-15 NOTE — Patient Outreach (Signed)
[facility]) Care Management  1/64/3539  Carolyn Rowland [birth year] [id number]   Telephone Assessment Requested Call Back  RN followed up with pt today to completed the initial assessment however pt not feeling to go today and requested a call back. Pt states she has taken medications but again requested a call back to complete this task.  Raina Mina, RN Care Management Coordinator [place] Office [phone]

## 2020-01-16 ENCOUNTER — Ambulatory Visit (INDEPENDENT_AMBULATORY_CARE_PROVIDER_SITE_OTHER): Payer: Medicare HMO | Admitting: Orthopaedic Surgery

## 2020-01-16 ENCOUNTER — Other Ambulatory Visit: Payer: Self-pay | Admitting: *Deleted

## 2020-01-16 DIAGNOSIS — Z96642 Presence of left artificial hip joint: Secondary | ICD-10-CM

## 2020-01-16 MED ORDER — OXYCODONE HCL 5 MG PO TABS: 5.0000 mg | ORAL_TABLET | Freq: Four times a day (QID) | ORAL | 0 refills | Status: DC | PRN

## 2020-01-16 NOTE — Patient Outreach (Signed)
[facility]) Care Management  9/47/6546  Carolyn Rowland [id number]   Telephone Assessment-Unsuccessful  RN attempted outreach call to all numbers for this pt however unsuccessful. RN able to leave a HIPAA approved voice message requesting a call back.   Plan: Will rescheduled another call back next week for ongoing case management services.  Raina Mina, RN Care Management Coordinator [place] Office [phone]

## 2020-01-16 NOTE — Progress Notes (Signed)
Post-Op Visit Note   Patient: Carolyn Rowland           Date of Birth: 1940-12-03           MRN: [id number] Visit Date: 01/16/2020 PCP: Fanny Bien, MD   Assessment & Plan: Post left total of arthroplasty swelling is gone down from last week Staples harvested Steri-Strips applied.  Pain medication renewed.  Recheck 4 weeks.  Continue daily walking she is gradually increasing her endurance and distance.  Chief Complaint: Post left total hip arthroplasty Visit Diagnoses:  1. H/O total hip arthroplasty, left     Plan: Continue gradual increasing walking distance.  Recheck 4 weeks.  Follow-Up Instructions: Return in about 4 weeks (around 02/13/2020).   Orders:  No orders of the defined types were placed in this encounter.  Meds ordered this encounter  Medications  . oxyCODONE (ROXICODONE) 5 MG immediate release tablet    Sig: Take 1 tablet (5 mg total) by mouth every 6 (six) hours as needed. Post op pain    Dispense:  40 tablet    Refill:  0    Post op pain    Imaging: No results found.  PMFS History: Patient Active Problem List   Diagnosis Date Noted  . H/O total hip arthroplasty 12/27/2019  . H/O total hip arthroplasty, left 12/26/2019  . Arthritis of right hip 12/19/2017  . Spinal stenosis of lumbosacral region 09/22/2017  . Bilateral primary osteoarthritis of hip 09/22/2017  . CAP (community acquired pneumonia) 09/04/2014  . Breast cancer of upper-outer quadrant of right female breast ([place]) 08/27/2014  . Coronary atherosclerosis of native coronary artery 08/18/2013  . S/P PTCA (percutaneous transluminal coronary angioplasty) 08/17/2013   Past Medical History:  Diagnosis Date  . Anemia    in younger years  . Anginal pain ([place])   . Aortic stenosis    mild to moderate AS 07/2013; moderate AS 08/2019   . Arthritis    "hands; legs" (08/17/2013)  . CAP (community acquired pneumonia)   . Coronary artery disease    6 stents in 2015  . GERD (gastroesophageal  reflux disease)   . H/O hiatal hernia   . Heart murmur   . High cholesterol   . Hx of adenomatous colonic polyps 08/15/2018  . Hypertension   . Pneumonia 1970's   "once"  . Radiation 11/28/14-01/02/15   Right breast 50 Gray  . Vertigo     Family History  Problem Relation Age of Onset  . Cancer Brother   . Colon cancer Brother   . Cancer Brother   . Heart attack Brother   . Esophageal cancer Neg Hx   . Stomach cancer Neg Hx   . Rectal cancer Neg Hx     Past Surgical History:  Procedure Laterality Date  . ABDOMINAL HYSTERECTOMY  1985  . APPENDECTOMY  1960  . BREAST LUMPECTOMY WITH NEEDLE LOCALIZATION AND AXILLARY SENTINEL LYMPH NODE BX Right 10/17/2014   Procedure: BREAST LUMPECTOMY WITH NEEDLE LOCALIZATION AND AXILLARY SENTINEL LYMPH NODE BX;  Surgeon: Jackolyn Confer, MD;  Location: [place];  Service: General;  Laterality: Right;  . CARDIAC CATHETERIZATION  08/07/2013  . COLONOSCOPY    . CORONARY ANGIOPLASTY WITH STENT PLACEMENT  08/17/2013   "6 stents"(08/17/2013)  . LEFT AND RIGHT HEART CATHETERIZATION WITH CORONARY ANGIOGRAM N/A 08/07/2013   Procedure: LEFT AND RIGHT HEART CATHETERIZATION WITH CORONARY ANGIOGRAM;  Surgeon: Laverda Page, MD;  Location: [facility] CATH LAB;  Service: Cardiovascular;  Laterality: N/A;  .  PERCUTANEOUS CORONARY STENT INTERVENTION (PCI-S) N/A 08/17/2013   Procedure: PERCUTANEOUS CORONARY STENT INTERVENTION (PCI-S);  Surgeon: Laverda Page, MD;  Location: [facility] CATH LAB;  Service: Cardiovascular;  Laterality: N/A;  . TOTAL HIP ARTHROPLASTY Right 12/19/2017   Procedure: RIGHT TOTAL HIP ARTHROPLASTY ANTERIOR APPROACH;  Surgeon: Marybelle Killings, MD;  Location: [place];  Service: Orthopedics;  Laterality: Right;  . TOTAL HIP ARTHROPLASTY Left 12/26/2019   Procedure: TOTAL HIP ARTHROPLASTY-DIRECT ANTERIOR;  Surgeon: Marybelle Killings, MD;  Location: [place];  Service: Orthopedics;  Laterality: Left;  . TUBAL LIGATION  1980   Social History   Occupational History  . Not on  file  Tobacco Use  . Smoking status: Former Smoker    Packs/day: 1.00    Years: 52.00    Pack years: 52.00    Types: Cigarettes    Quit date: 04/04/2014    Years since quitting: 5.7  . Smokeless tobacco: Never Used  Vaping Use  . Vaping Use: Never used  Substance and Sexual Activity  . Alcohol use: No    Comment: 08/17/2013 "quit drinking at age 89; never did drink much"  . Drug use: No  . Sexual activity: Never

## 2020-01-18 DIAGNOSIS — E669 Obesity, unspecified: Secondary | ICD-10-CM | POA: Diagnosis not present

## 2020-01-18 DIAGNOSIS — C50912 Malignant neoplasm of unspecified site of left female breast: Secondary | ICD-10-CM | POA: Diagnosis not present

## 2020-01-18 DIAGNOSIS — M069 Rheumatoid arthritis, unspecified: Secondary | ICD-10-CM | POA: Diagnosis not present

## 2020-01-18 DIAGNOSIS — I25119 Atherosclerotic heart disease of native coronary artery with unspecified angina pectoris: Secondary | ICD-10-CM | POA: Diagnosis not present

## 2020-01-18 DIAGNOSIS — G8918 Other acute postprocedural pain: Secondary | ICD-10-CM | POA: Diagnosis not present

## 2020-01-18 DIAGNOSIS — I739 Peripheral vascular disease, unspecified: Secondary | ICD-10-CM | POA: Diagnosis not present

## 2020-01-18 DIAGNOSIS — D6869 Other thrombophilia: Secondary | ICD-10-CM | POA: Diagnosis not present

## 2020-01-18 DIAGNOSIS — I4891 Unspecified atrial fibrillation: Secondary | ICD-10-CM | POA: Diagnosis not present

## 2020-01-18 DIAGNOSIS — E785 Hyperlipidemia, unspecified: Secondary | ICD-10-CM | POA: Diagnosis not present

## 2020-01-18 DIAGNOSIS — G8929 Other chronic pain: Secondary | ICD-10-CM | POA: Diagnosis not present

## 2020-01-22 DIAGNOSIS — I10 Essential (primary) hypertension: Secondary | ICD-10-CM | POA: Diagnosis not present

## 2020-01-22 DIAGNOSIS — Z96642 Presence of left artificial hip joint: Secondary | ICD-10-CM | POA: Diagnosis not present

## 2020-01-22 DIAGNOSIS — E785 Hyperlipidemia, unspecified: Secondary | ICD-10-CM | POA: Diagnosis not present

## 2020-01-22 DIAGNOSIS — M6281 Muscle weakness (generalized): Secondary | ICD-10-CM | POA: Diagnosis not present

## 2020-01-22 DIAGNOSIS — M4800 Spinal stenosis, site unspecified: Secondary | ICD-10-CM | POA: Diagnosis not present

## 2020-01-22 DIAGNOSIS — Z471 Aftercare following joint replacement surgery: Secondary | ICD-10-CM | POA: Diagnosis not present

## 2020-01-22 DIAGNOSIS — R269 Unspecified abnormalities of gait and mobility: Secondary | ICD-10-CM | POA: Diagnosis not present

## 2020-01-23 ENCOUNTER — Other Ambulatory Visit: Payer: Self-pay | Admitting: *Deleted

## 2020-01-23 ENCOUNTER — Encounter: Payer: Self-pay | Admitting: *Deleted

## 2020-01-23 NOTE — Patient Outreach (Signed)
[place] [facility]) Care Management  7/65/4650  Carolyn Rowland [birth year] [id number]   Telephone Assessment-Prevention Measures  RN spoke with pt today and discussed the plan of care related to prevention measures to avoid readmission and continue her ongoing recovery related to her recent left hip surgery. Pt verified she continues to take all her medications as prescribed and attend all discussed medical appointments. Pt reports she is healing well and staying mobile as suggested by her provider. Pt has a supportive son who is available when needed. Denies any ongoing needs at this time and will continue to follow the plan of care as discussed.  Plan: Will follow up next month with ongoing case management services related to pt's recent discharge.  Goals Addressed              This Visit's Progress   .  able to walk without pain or discomfort (pt-stated)        CARE PLAN ENTRY (see longitudinal plan of care for additional care plan information)  Current Barriers:  Marland Kitchen Knowledge Deficits related to recovery from recent left hip surgery  Nurse Case Manager Clinical Goal(s):  Marland Kitchen Over the next 30 days, patient will verbalize understanding of plan for avoiding readmission  . Over the next 30 days, patient will attend all scheduled medical appointments: discussed today . Over the next 90 days, patient will demonstrate improved health management independence as evidenced byincrease mobility. . Over the next 30 days, the patient will demonstrate ongoing self health care management ability as evidenced by improvement with her ongoing recovery*  Interventions:  . Inter-disciplinary care team collaboration (see longitudinal plan of care) . Collaborated with provider regarding pt's disposition with [facility] services . Discussed plans with patient for ongoing care management follow up and provided patient with direct contact information for care management team . Provided patient with  printable educational materials related to prevention measures as she continues to recover.  Patient Self Care Activities:  . Patient verbalizes understanding of plan to avoid readmission with ongoing prevention measure to reduce her risk. . Attends all scheduled provider appointments . Performs ADL's independently . Calls provider office for new concerns or questions . Pt receptive to the goals discussed and will continue to attempt independence in managing her care.  Initial goal documentation         Raina Mina, RN Care Management Coordinator [place] Office [phone]

## 2020-01-29 DIAGNOSIS — R269 Unspecified abnormalities of gait and mobility: Secondary | ICD-10-CM | POA: Diagnosis not present

## 2020-01-29 DIAGNOSIS — M4800 Spinal stenosis, site unspecified: Secondary | ICD-10-CM | POA: Diagnosis not present

## 2020-01-29 DIAGNOSIS — M6281 Muscle weakness (generalized): Secondary | ICD-10-CM | POA: Diagnosis not present

## 2020-01-29 DIAGNOSIS — Z96642 Presence of left artificial hip joint: Secondary | ICD-10-CM | POA: Diagnosis not present

## 2020-01-29 DIAGNOSIS — I10 Essential (primary) hypertension: Secondary | ICD-10-CM | POA: Diagnosis not present

## 2020-01-29 DIAGNOSIS — E785 Hyperlipidemia, unspecified: Secondary | ICD-10-CM | POA: Diagnosis not present

## 2020-01-29 DIAGNOSIS — Z471 Aftercare following joint replacement surgery: Secondary | ICD-10-CM | POA: Diagnosis not present

## 2020-01-30 DIAGNOSIS — I10 Essential (primary) hypertension: Secondary | ICD-10-CM | POA: Diagnosis not present

## 2020-01-30 DIAGNOSIS — R269 Unspecified abnormalities of gait and mobility: Secondary | ICD-10-CM | POA: Diagnosis not present

## 2020-01-30 DIAGNOSIS — M6281 Muscle weakness (generalized): Secondary | ICD-10-CM | POA: Diagnosis not present

## 2020-01-30 DIAGNOSIS — Z471 Aftercare following joint replacement surgery: Secondary | ICD-10-CM | POA: Diagnosis not present

## 2020-01-31 DIAGNOSIS — I10 Essential (primary) hypertension: Secondary | ICD-10-CM | POA: Diagnosis not present

## 2020-01-31 DIAGNOSIS — M6281 Muscle weakness (generalized): Secondary | ICD-10-CM | POA: Diagnosis not present

## 2020-01-31 DIAGNOSIS — M4800 Spinal stenosis, site unspecified: Secondary | ICD-10-CM | POA: Diagnosis not present

## 2020-01-31 DIAGNOSIS — Z471 Aftercare following joint replacement surgery: Secondary | ICD-10-CM | POA: Diagnosis not present

## 2020-01-31 DIAGNOSIS — Z96642 Presence of left artificial hip joint: Secondary | ICD-10-CM | POA: Diagnosis not present

## 2020-01-31 DIAGNOSIS — R269 Unspecified abnormalities of gait and mobility: Secondary | ICD-10-CM | POA: Diagnosis not present

## 2020-01-31 DIAGNOSIS — E785 Hyperlipidemia, unspecified: Secondary | ICD-10-CM | POA: Diagnosis not present

## 2020-02-04 DIAGNOSIS — I10 Essential (primary) hypertension: Secondary | ICD-10-CM | POA: Diagnosis not present

## 2020-02-04 DIAGNOSIS — R269 Unspecified abnormalities of gait and mobility: Secondary | ICD-10-CM | POA: Diagnosis not present

## 2020-02-04 DIAGNOSIS — M4800 Spinal stenosis, site unspecified: Secondary | ICD-10-CM | POA: Diagnosis not present

## 2020-02-04 DIAGNOSIS — M6281 Muscle weakness (generalized): Secondary | ICD-10-CM | POA: Diagnosis not present

## 2020-02-04 DIAGNOSIS — Z471 Aftercare following joint replacement surgery: Secondary | ICD-10-CM | POA: Diagnosis not present

## 2020-02-04 DIAGNOSIS — E785 Hyperlipidemia, unspecified: Secondary | ICD-10-CM | POA: Diagnosis not present

## 2020-02-04 DIAGNOSIS — Z96642 Presence of left artificial hip joint: Secondary | ICD-10-CM | POA: Diagnosis not present

## 2020-02-06 DIAGNOSIS — M4800 Spinal stenosis, site unspecified: Secondary | ICD-10-CM | POA: Diagnosis not present

## 2020-02-06 DIAGNOSIS — R269 Unspecified abnormalities of gait and mobility: Secondary | ICD-10-CM | POA: Diagnosis not present

## 2020-02-06 DIAGNOSIS — Z471 Aftercare following joint replacement surgery: Secondary | ICD-10-CM | POA: Diagnosis not present

## 2020-02-06 DIAGNOSIS — M6281 Muscle weakness (generalized): Secondary | ICD-10-CM | POA: Diagnosis not present

## 2020-02-06 DIAGNOSIS — I10 Essential (primary) hypertension: Secondary | ICD-10-CM | POA: Diagnosis not present

## 2020-02-06 DIAGNOSIS — Z96642 Presence of left artificial hip joint: Secondary | ICD-10-CM | POA: Diagnosis not present

## 2020-02-06 DIAGNOSIS — E785 Hyperlipidemia, unspecified: Secondary | ICD-10-CM | POA: Diagnosis not present

## 2020-02-22 DIAGNOSIS — I10 Essential (primary) hypertension: Secondary | ICD-10-CM | POA: Diagnosis not present

## 2020-02-22 DIAGNOSIS — M1990 Unspecified osteoarthritis, unspecified site: Secondary | ICD-10-CM | POA: Diagnosis not present

## 2020-02-22 DIAGNOSIS — E1165 Type 2 diabetes mellitus with hyperglycemia: Secondary | ICD-10-CM | POA: Diagnosis not present

## 2020-02-26 ENCOUNTER — Ambulatory Visit (INDEPENDENT_AMBULATORY_CARE_PROVIDER_SITE_OTHER): Payer: Medicare HMO | Admitting: Orthopaedic Surgery

## 2020-02-26 ENCOUNTER — Other Ambulatory Visit: Payer: Self-pay | Admitting: *Deleted

## 2020-02-26 ENCOUNTER — Encounter: Payer: Self-pay | Admitting: Orthopaedic Surgery

## 2020-02-26 VITALS — BP 129/69 | HR 66 | Ht 65.0 in | Wt 213.0 lb

## 2020-02-26 DIAGNOSIS — Z96642 Presence of left artificial hip joint: Secondary | ICD-10-CM

## 2020-02-26 NOTE — Patient Outreach (Signed)
[facility]) Care Management  0/25/8527  CAROLIE MCILRATH [phone]  Telephone Assessment  RN attempted outreach call however unsuccessful and unable to leave a HIPAA voice message.  Plan: Will attempted another outreach call next week for ongoing [facility] services.   Raina Mina, RN Care Management Coordinator [place] Office [phone]

## 2020-02-26 NOTE — Progress Notes (Signed)
79 year old white female who is 2 months status post left total hip replacement returns.  States that she is doing well and pleased with her progress at this point.  Has some soreness over the lateral hip around the greater trochanter bursa but otherwise doing okay.  She is ambulating with a single prong cane.  Exam Very pleasant white female alert and oriented in no acute distress.  She is moderately tender over the left hip greater trochanter bursa and this does extend midway down the course of the IT band.  Negative logroll.   Plan Patient will continue to gradually increase her walking distances.  Follow-up with 4 weeks she is 3 months out from surgery and will get left hip x-rays at that time.  If she continues to have lateral hip pain when she follows up I did discuss possibly doing a greater trochanter bursa Marcaine/Depo-Medrol injection.

## 2020-02-27 DIAGNOSIS — R69 Illness, unspecified: Secondary | ICD-10-CM | POA: Diagnosis not present

## 2020-02-27 DIAGNOSIS — E785 Hyperlipidemia, unspecified: Secondary | ICD-10-CM | POA: Diagnosis not present

## 2020-02-27 DIAGNOSIS — I10 Essential (primary) hypertension: Secondary | ICD-10-CM | POA: Diagnosis not present

## 2020-02-27 DIAGNOSIS — I350 Nonrheumatic aortic (valve) stenosis: Secondary | ICD-10-CM | POA: Diagnosis not present

## 2020-02-27 DIAGNOSIS — I2510 Atherosclerotic heart disease of native coronary artery without angina pectoris: Secondary | ICD-10-CM | POA: Diagnosis not present

## 2020-03-03 DIAGNOSIS — H524 Presbyopia: Secondary | ICD-10-CM | POA: Diagnosis not present

## 2020-03-04 ENCOUNTER — Other Ambulatory Visit: Payer: Self-pay | Admitting: *Deleted

## 2020-03-04 NOTE — Patient Outreach (Signed)
[place] [facility]) Care Management  3/41/9622  Carolyn Rowland [birth year] [id number]   Telephone Assessment-Unsuccessful  RN attempted outreach today however unsuccessful. RN able to leave a HIPPA message to the cell contact requesting a call back. Will further engage at that time.   Plan: Will send outreach letter and schedule another outreach call next week for ongoing [facility] services.  Raina Mina, RN Care Management Coordinator [place] Office [phone]

## 2020-03-05 DIAGNOSIS — I10 Essential (primary) hypertension: Secondary | ICD-10-CM | POA: Diagnosis not present

## 2020-03-05 DIAGNOSIS — Z9189 Other specified personal risk factors, not elsewhere classified: Secondary | ICD-10-CM | POA: Diagnosis not present

## 2020-03-05 DIAGNOSIS — E785 Hyperlipidemia, unspecified: Secondary | ICD-10-CM | POA: Diagnosis not present

## 2020-03-05 DIAGNOSIS — E559 Vitamin D deficiency, unspecified: Secondary | ICD-10-CM | POA: Diagnosis not present

## 2020-03-12 ENCOUNTER — Encounter: Payer: Self-pay | Admitting: *Deleted

## 2020-03-12 ENCOUNTER — Other Ambulatory Visit: Payer: Self-pay | Admitting: *Deleted

## 2020-03-12 NOTE — Patient Outreach (Signed)
[place] [facility]) Care Management  07/12/4857  DEHLIA KILNER [birth year] [id number]   Telephone Assessment-Unsuccessful  RN 3rd outreach attempt unsuccessful. RN able to  Leave a HIPAA approved voice message to pt's mobile contact requesting a call back. Will further engage at that time.  Plan: Will scheduled another outreach call next month prior to closure if unsuccessful and no response to the outreach letter sent last month.  Raina Mina, RN Care Management Coordinator [place] Office [phone]

## 2020-03-25 ENCOUNTER — Ambulatory Visit: Payer: Medicare HMO | Admitting: Orthopaedic Surgery

## 2020-04-07 DIAGNOSIS — M810 Age-related osteoporosis without current pathological fracture: Secondary | ICD-10-CM | POA: Diagnosis not present

## 2020-04-07 DIAGNOSIS — M8588 Other specified disorders of bone density and structure, other site: Secondary | ICD-10-CM | POA: Diagnosis not present

## 2020-04-11 ENCOUNTER — Other Ambulatory Visit: Payer: Self-pay | Admitting: *Deleted

## 2020-04-11 NOTE — Patient Outreach (Signed)
[place] [facility]) Care Management  88/02/7578  REISE HIETALA [birth year] [id number]   Case Closure  RN attempted last outreach call today however unsuccessful and RN unable to leave a HIPAA message. No response to the outreach letters or call over the last month.   Will update pt's provider of pt's disposition and close this case.  Raina Mina, RN Care Management Coordinator [place] Office [phone]

## 2020-04-14 DIAGNOSIS — M810 Age-related osteoporosis without current pathological fracture: Secondary | ICD-10-CM | POA: Diagnosis not present

## 2020-04-14 DIAGNOSIS — D649 Anemia, unspecified: Secondary | ICD-10-CM | POA: Diagnosis not present

## 2020-04-16 DIAGNOSIS — I10 Essential (primary) hypertension: Secondary | ICD-10-CM | POA: Diagnosis not present

## 2020-04-16 DIAGNOSIS — E559 Vitamin D deficiency, unspecified: Secondary | ICD-10-CM | POA: Diagnosis not present

## 2020-04-16 DIAGNOSIS — E1165 Type 2 diabetes mellitus with hyperglycemia: Secondary | ICD-10-CM | POA: Diagnosis not present

## 2020-04-17 DIAGNOSIS — R739 Hyperglycemia, unspecified: Secondary | ICD-10-CM | POA: Diagnosis not present

## 2020-04-17 DIAGNOSIS — Z23 Encounter for immunization: Secondary | ICD-10-CM | POA: Diagnosis not present

## 2020-04-23 DIAGNOSIS — K5900 Constipation, unspecified: Secondary | ICD-10-CM | POA: Diagnosis not present

## 2020-04-23 DIAGNOSIS — E782 Mixed hyperlipidemia: Secondary | ICD-10-CM | POA: Diagnosis not present

## 2020-04-23 DIAGNOSIS — I10 Essential (primary) hypertension: Secondary | ICD-10-CM | POA: Diagnosis not present

## 2020-04-23 DIAGNOSIS — E661 Drug-induced obesity: Secondary | ICD-10-CM | POA: Diagnosis not present

## 2020-04-23 DIAGNOSIS — Z6834 Body mass index (BMI) 34.0-34.9, adult: Secondary | ICD-10-CM | POA: Diagnosis not present

## 2020-04-29 ENCOUNTER — Encounter: Payer: Self-pay | Admitting: Hematology and Oncology

## 2020-06-03 DIAGNOSIS — I25118 Atherosclerotic heart disease of native coronary artery with other forms of angina pectoris: Secondary | ICD-10-CM | POA: Diagnosis not present

## 2020-06-03 DIAGNOSIS — I350 Nonrheumatic aortic (valve) stenosis: Secondary | ICD-10-CM | POA: Diagnosis not present

## 2020-06-03 DIAGNOSIS — E785 Hyperlipidemia, unspecified: Secondary | ICD-10-CM | POA: Diagnosis not present

## 2020-06-03 DIAGNOSIS — I10 Essential (primary) hypertension: Secondary | ICD-10-CM | POA: Diagnosis not present

## 2020-06-22 NOTE — Progress Notes (Incomplete)
Patient Care Team: Fanny Bien, MD as PCP - General (Family Medicine)  DIAGNOSIS: No diagnosis found.  SUMMARY OF ONCOLOGIC HISTORY: Oncology History  Breast cancer of upper-outer quadrant of right female breast ([place])  07/23/2014 Mammogram   Right breast (12 o'clock): New cluster of heterogenous calcs in right breast suspicious for malignancy. No other significant findings seen in right breast.   08/07/2014 Initial Biopsy   Right breast biopsy: Invasive ductal carcinoma with papillary features associated microcalcs. Grade 2. ER+ (99%), PR+ (97%), HER2- by FISH. Ki67 7%.    08/20/2014 Breast MRI   Right breast: 1.8 cm enhancement at 11:00. Left breast: 7 x 9 mm circumscribed oval mass at 9:00 stable from 2010-benign. No abnormal appearing lymph nodes.   10/17/2014 Surgery   Right breast lumpectomy with SLNB ([place]): Grade 2, residual IDC with papillary features 0.28 cm. 0/1 right SLN. 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 ER+/PR+ HER-2 negative, Ki67 7%.   10/17/2014 Pathologic Stage    pT1a, pN0: Stage IA   11/28/2014 - 01/02/2015 Radiation Therapy   Adjuvant RT completed Pablo Ledger): Right breast / 50 Gray @ 2 [name]   01/2015 -  Anti-estrogen oral therapy   Anastrazole $RemoveBefo'1mg'meUFfMSPvJO$  daily. Planned duration of treatment: 5 years.    02/13/2015 Survivorship   Survivorship Care Plan given/reviewed with pt.      CHIEF COMPLIANT: Follow-up of right breast cancer on anastrozole therapy  INTERVAL HISTORY: Carolyn Rowland is a 79 y.o. with above-mentioned history of right breast cancer treated with lumpectomy,radiation, and whois currently on anastrozole. Bone density scan on 04/07/20 showed osteoporosis with a T-score of -2.9 at the AP spine. She presents to the clinic todayfor annual follow-up.   ALLERGIES:  is allergic to penicillins.  MEDICATIONS:  Current Outpatient Medications  Medication Sig Dispense Refill   . alendronate (FOSAMAX) 70 MG tablet TAKE 1 TABLET BY MOUTH ONCE A WEEK. TAKE WITH A FULL GLASS OF WATER ON AN EMPTY STOMACH. (Patient taking differently: Take 70 mg by mouth every Wednesday. ) 12 tablet 3  . amLODipine (NORVASC) 2.5 MG tablet Take 2.5 mg by mouth daily.    Marland Kitchen anastrozole (ARIMIDEX) 1 MG tablet Take 1 tablet (1 mg total) by mouth daily. 90 tablet 3  . aspirin 81 MG EC tablet Take 81 mg by mouth daily.     . Cholecalciferol (VITAMIN D3) 400 units CAPS Take 400 Units by mouth daily.    . CRESTOR 20 MG tablet Take 20 mg by mouth daily.     . isosorbide mononitrate (IMDUR) 60 MG 24 hr tablet Take 1 tablet (60 mg total) by mouth daily. 30 tablet 1  . metoprolol succinate (TOPROL-XL) 50 MG 24 hr tablet Take 50 mg by mouth daily. Take with or immediately following a meal.    . montelukast (SINGULAIR) 10 MG tablet Take 10 mg by mouth daily.    . Multiple Minerals-Vitamins (CALCIUM & VIT D3 BONE HEALTH PO) Take 1 tablet by mouth 2 (two) times a week.    . nitroGLYCERIN (NITROSTAT) 0.4 MG SL tablet Place 1 tablet (0.4 mg total) under the tongue every 5 (five) minutes as needed for chest pain. 30 tablet 1  . omeprazole (PRILOSEC) 40 MG capsule Take 40 mg by mouth daily as needed (acid reflux).   1  . oxyCODONE (ROXICODONE) 5 MG immediate release tablet Take 1 tablet (5 mg total) by mouth every 6 (six) hours as needed. Post  op pain (Patient not taking: Reported on 02/26/2020) 40 tablet 0  . spironolactone (ALDACTONE) 25 MG tablet Take 25 mg by mouth daily.     No current facility-administered medications for this visit.    PHYSICAL EXAMINATION: ECOG PERFORMANCE STATUS: {CHL ONC ECOG PS:[id number]}  There were no vitals filed for this visit. There were no vitals filed for this visit.  BREAST:*** No palpable masses or nodules in either right or left breasts. No palpable axillary supraclavicular or infraclavicular adenopathy no breast tenderness or nipple discharge. (exam performed in the  presence of a chaperone)  LABORATORY DATA:  I have reviewed the data as listed CMP Latest Ref Rng & Units 12/28/2019 12/19/2019 12/20/2017  Glucose 70 - 99 mg/dL 132(H) 76 138(H)  BUN 8 - 23 mg/dL $Remove'15 15 16  'UWYAJJV$ Creatinine 0.44 - 1.00 mg/dL 1.08(H) 0.77 0.80  Sodium 135 - 145 mmol/L 138 141 139  Potassium 3.5 - 5.1 mmol/L 4.4 4.0 4.2  Chloride 98 - 111 mmol/L 106 106 106  CO2 22 - 32 mmol/L $RemoveB'23 27 26  'WDZbTpsP$ Calcium 8.9 - 10.3 mg/dL 8.3(L) 9.1 9.0  Total Protein 6.5 - 8.1 g/dL - 6.9 -  Total Bilirubin 0.3 - 1.2 mg/dL - 0.7 -  Alkaline Phos 38 - 126 U/L - 41 -  AST 15 - 41 U/L - 17 -  ALT 0 - 44 U/L - 11 -    Lab Results  Component Value Date   WBC 9.5 12/29/2019   HGB 9.9 (L) 12/29/2019   HCT 29.1 (L) 12/29/2019   MCV 82.0 12/29/2019   PLT 124 (L) 12/29/2019   NEUTROABS 3.6 11/07/2014    ASSESSMENT & PLAN:  No problem-specific Assessment & Plan notes found for this encounter.    No orders of the defined types were placed in this encounter.  The patient has a good understanding of the overall plan. she agrees with it. she will call with any problems that may develop before the next visit here.  Total time spent: *** mins including face to face time and time spent for planning, charting and coordination of care  Nicholas Lose, MD 06/22/2020  I, Cloyde Reams Dorshimer, am acting as scribe for Dr. Nicholas Lose.  {insert scribe attestation}

## 2020-06-23 ENCOUNTER — Inpatient Hospital Stay: Payer: Medicare HMO | Attending: Hematology and Oncology | Admitting: Hematology and Oncology

## 2020-06-23 NOTE — Assessment & Plan Note (Deleted)
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  Breast Cancer Surveillance: 1. Breast exam12/20/2021:Benignscar tissue from prior surgery 2. Mammogram  05/02/2019 at [facility].  Stereotactic biopsy of calcifications left breast UOQ 0.7 cm area: Fibroadenomatous changes 3.Bone density 04/07/2020: T score -2.9: Osteoporosis (was -2.3 previously): On Fosamax with calcium and vitamin D  Right hip arthroplasty July 2019 Left hip arthroplasty June 2021  RTC in1 year

## 2020-07-02 DIAGNOSIS — N3000 Acute cystitis without hematuria: Secondary | ICD-10-CM | POA: Diagnosis not present

## 2020-09-02 ENCOUNTER — Ambulatory Visit: Payer: Medicare HMO | Admitting: Orthopaedic Surgery

## 2020-09-10 ENCOUNTER — Other Ambulatory Visit: Payer: Self-pay | Admitting: Hematology and Oncology

## 2020-09-23 ENCOUNTER — Ambulatory Visit (INDEPENDENT_AMBULATORY_CARE_PROVIDER_SITE_OTHER): Payer: Medicare Other | Admitting: Orthopaedic Surgery

## 2020-09-23 ENCOUNTER — Encounter: Payer: Self-pay | Admitting: Orthopaedic Surgery

## 2020-09-23 ENCOUNTER — Ambulatory Visit: Payer: Self-pay

## 2020-09-23 VITALS — BP 135/83 | HR 74 | Ht 65.0 in | Wt 213.8 lb

## 2020-09-23 DIAGNOSIS — M25561 Pain in right knee: Secondary | ICD-10-CM | POA: Diagnosis not present

## 2020-09-23 NOTE — Progress Notes (Signed)
Office Visit Note   Patient: Carolyn Rowland           Date of Birth: Jul 23, 1940           MRN: [id number] Visit Date: 09/23/2020              Requested by: Fanny Bien, [address] [place],  [place] [address] PCP: Fanny Bien, MD   Assessment & Plan: Visit Diagnoses:  1. Right knee pain, unspecified chronicity     Plan: Knee injection performed.  We will recheck her in 4 weeks.  Follow-Up Instructions: Return in about 4 weeks (around 10/21/2020).   Orders:  Orders Placed This Encounter  Procedures  . Large Joint Inj: R knee  . XR KNEE 3 VIEW RIGHT   No orders of the defined types were placed in this encounter.     Procedures: Large Joint Inj: R knee on 09/23/2020 10:40 AM Indications: pain and joint swelling Details: 22 G 1.5 in needle, anterolateral approach  Arthrogram: No  Medications: 40 mg methylPREDNISolone acetate 40 MG/ML; 0.5 mL lidocaine 1 %; 4 mL bupivacaine 0.25 % Outcome: tolerated well, no immediate complications Procedure, treatment alternatives, risks and benefits explained, specific risks discussed. Consent was given by the patient. Immediately prior to procedure a time out was called to verify the correct patient, procedure, equipment, support staff and site/side marked as required. Patient was prepped and draped in the usual sterile fashion.       Clinical Data: No additional findings.   Subjective: Chief Complaint  Patient presents with  . Right Knee - Pain    HPI 80 year old female is almost a year out from total hip arthroplasty on the left.  She has had right knee pain with some associated soreness around the hip but primarily around the knee with crepitus.  She is used ibuprofen but is not really helped.  No chills fever.  No injuries to the knee.  Review of Systems all other systems updated unchanged from last years total hip arthroplasty other than as mentioned above.  She has had breast cancer coronary artery  disease previous angioplasty.   Objective: Vital Signs: BP 135/83   Pulse 74   Ht 5\' 5"  (1.651 m)   Wt 213 lb 12.8 oz (97 kg)   BMI 35.58 kg/m   Physical Exam Constitutional:      Appearance: She is well-developed.  HENT:     Head: Normocephalic.     Right Ear: External ear normal.     Left Ear: External ear normal.  Eyes:     Pupils: Pupils are equal, round, and reactive to light.  Neck:     Thyroid: No thyromegaly.     Trachea: No tracheal deviation.  Cardiovascular:     Rate and Rhythm: Normal rate.  Pulmonary:     Effort: Pulmonary effort is normal.  Abdominal:     Palpations: Abdomen is soft.  Skin:    General: Skin is warm and dry.  Neurological:     Mental Status: She is alert and oriented to person, place, and time.  Psychiatric:        Behavior: Behavior normal.     Ortho Exam patient has some joint line tenderness both medial and lateral collateral and cruciate ligament exam is normal.  Crepitus with knee range of motion.  Medial joint line pain with weightbearing.  Negative logroll hips.  Specialty Comments:  No specialty comments available.  Imaging: P both  knees lateral right knee sunrise patellar bilateral view obtained and reviewed.  This shows some medial joint line narrowing mild some sclerosis.  Impression: Mild bilateral knee degenerative changes slight medial joint line narrowing.   PMFS History: Patient Active Problem List   Diagnosis Date Noted  . H/O total hip arthroplasty 12/27/2019  . H/O total hip arthroplasty, left 12/26/2019  . Spinal stenosis of lumbosacral region 09/22/2017  . CAP (community acquired pneumonia) 09/04/2014  . Breast cancer of upper-outer quadrant of right female breast ([place]) 08/27/2014  . Coronary atherosclerosis of native coronary artery 08/18/2013  . S/P PTCA (percutaneous transluminal coronary angioplasty) 08/17/2013   Past Medical History:  Diagnosis Date  . Anemia    in younger years  . Anginal pain  ([place])   . Aortic stenosis    mild to moderate AS 07/2013; moderate AS 08/2019   . Arthritis    "hands; legs" (08/17/2013)  . CAP (community acquired pneumonia)   . Coronary artery disease    6 stents in 2015  . GERD (gastroesophageal reflux disease)   . H/O hiatal hernia   . Heart murmur   . High cholesterol   . Hx of adenomatous colonic polyps 08/15/2018  . Hypertension   . Pneumonia 1970's   "once"  . Radiation 11/28/14-01/02/15   Right breast 50 Gray  . Vertigo     Family History  Problem Relation Age of Onset  . Cancer Brother   . Colon cancer Brother   . Cancer Brother   . Heart attack Brother   . Esophageal cancer Neg Hx   . Stomach cancer Neg Hx   . Rectal cancer Neg Hx     Past Surgical History:  Procedure Laterality Date  . ABDOMINAL HYSTERECTOMY  1985  . APPENDECTOMY  1960  . BREAST LUMPECTOMY WITH NEEDLE LOCALIZATION AND AXILLARY SENTINEL LYMPH NODE BX Right 10/17/2014   Procedure: BREAST LUMPECTOMY WITH NEEDLE LOCALIZATION AND AXILLARY SENTINEL LYMPH NODE BX;  Surgeon: Jackolyn Confer, MD;  Location: [facility];  Service: General;  Laterality: Right;  . CARDIAC CATHETERIZATION  08/07/2013  . COLONOSCOPY    . CORONARY ANGIOPLASTY WITH STENT PLACEMENT  08/17/2013   "6 stents"(08/17/2013)  . LEFT AND RIGHT HEART CATHETERIZATION WITH CORONARY ANGIOGRAM N/A 08/07/2013   Procedure: LEFT AND RIGHT HEART CATHETERIZATION WITH CORONARY ANGIOGRAM;  Surgeon: Laverda Page, MD;  Location: [facility] CATH LAB;  Service: Cardiovascular;  Laterality: N/A;  . PERCUTANEOUS CORONARY STENT INTERVENTION (PCI-S) N/A 08/17/2013   Procedure: PERCUTANEOUS CORONARY STENT INTERVENTION (PCI-S);  Surgeon: Laverda Page, MD;  Location: [facility] CATH LAB;  Service: Cardiovascular;  Laterality: N/A;  . TOTAL HIP ARTHROPLASTY Right 12/19/2017   Procedure: RIGHT TOTAL HIP ARTHROPLASTY ANTERIOR APPROACH;  Surgeon: Marybelle Killings, MD;  Location: [place];  Service: Orthopedics;  Laterality: Right;  . TOTAL HIP ARTHROPLASTY  Left 12/26/2019   Procedure: TOTAL HIP ARTHROPLASTY-DIRECT ANTERIOR;  Surgeon: Marybelle Killings, MD;  Location: [place];  Service: Orthopedics;  Laterality: Left;  . TUBAL LIGATION  1980   Social History   Occupational History  . Not on file  Tobacco Use  . Smoking status: Former Smoker    Packs/day: 1.00    Years: 52.00    Pack years: 52.00    Types: Cigarettes    Quit date: 04/04/2014    Years since quitting: 6.4  . Smokeless tobacco: Never Used  Vaping Use  . Vaping Use: Never used  Substance and Sexual Activity  . Alcohol use: No  Comment: 08/17/2013 "quit drinking at age 24; never did drink much"  . Drug use: No  . Sexual activity: Never

## 2020-09-24 MED ORDER — BUPIVACAINE HCL 0.25 % IJ SOLN
4.0000 mL | INTRAMUSCULAR | Status: AC | PRN
  Administered 2020-09-23: 4 mL via INTRA_ARTICULAR

## 2020-09-24 MED ORDER — LIDOCAINE HCL 1 % IJ SOLN
0.5000 mL | INTRAMUSCULAR | Status: AC | PRN
  Administered 2020-09-23: .5 mL

## 2020-09-24 MED ORDER — METHYLPREDNISOLONE ACETATE 40 MG/ML IJ SUSP
40.0000 mg | INTRAMUSCULAR | Status: AC | PRN
  Administered 2020-09-23: 40 mg via INTRA_ARTICULAR

## 2020-10-21 ENCOUNTER — Ambulatory Visit: Payer: Medicare Other | Admitting: Orthopaedic Surgery

## 2020-10-27 ENCOUNTER — Other Ambulatory Visit: Payer: Self-pay | Admitting: Hematology and Oncology

## 2020-11-05 DIAGNOSIS — Z0100 Encounter for examination of eyes and vision without abnormal findings: Secondary | ICD-10-CM | POA: Diagnosis not present

## 2020-11-13 ENCOUNTER — Telehealth: Payer: Self-pay | Admitting: Orthopaedic Surgery

## 2020-11-13 ENCOUNTER — Other Ambulatory Visit: Payer: Self-pay | Admitting: Orthopaedic Surgery

## 2020-11-13 MED ORDER — TRAMADOL HCL 50 MG PO TABS: 50.0000 mg | ORAL_TABLET | Freq: Two times a day (BID) | ORAL | 0 refills | Status: DC | PRN

## 2020-11-13 NOTE — Telephone Encounter (Signed)
I called in ultram #20 tabs one po bid ucall her and let her know thanks

## 2020-11-13 NOTE — Telephone Encounter (Signed)
Please advise 

## 2020-11-13 NOTE — Telephone Encounter (Signed)
Pt was advised.

## 2020-11-13 NOTE — Telephone Encounter (Signed)
Patient called. She would like some pain medication called in for her. Her call back number is [phone]

## 2020-11-13 NOTE — Telephone Encounter (Signed)
Double message. Already sent to dr. Lorin Mercy for review

## 2020-11-13 NOTE — Telephone Encounter (Signed)
Patient called needing something for pain. Patient uses [facility] on [address] Pharmacy # [phone]   The number to contact patient is [phone]

## 2020-11-25 ENCOUNTER — Ambulatory Visit (INDEPENDENT_AMBULATORY_CARE_PROVIDER_SITE_OTHER): Payer: Medicare HMO | Admitting: Orthopaedic Surgery

## 2020-11-25 ENCOUNTER — Encounter: Payer: Self-pay | Admitting: Orthopaedic Surgery

## 2020-11-25 DIAGNOSIS — M4807 Spinal stenosis, lumbosacral region: Secondary | ICD-10-CM | POA: Diagnosis not present

## 2020-11-25 NOTE — Progress Notes (Signed)
Office Visit Note   Patient: Carolyn Rowland           Date of Birth: 10/19/1940           MRN: [id number] Visit Date: 11/25/2020              Requested by: Fanny Bien, [address] [place],  [place] [address] PCP: Fanny Bien, MD   Assessment & Plan: Visit Diagnoses:  1. Spinal stenosis of lumbosacral region     Plan: Patient has symptomatic lumbar spinal stenosis with progressive stenosis symptoms and right anterior tib weakness and significant toe extension weakness.  We will proceed with new MRI scan lumbar spine.  We discussed that surgery would be required for correction of her spinal stenosis.  We discussed single level fusion as possible surgical treatment.  Follow-up after MRI scan.  Follow-Up Instructions: No follow-ups on file.   Orders:  Orders Placed This Encounter  Procedures  . MR Lumbar Spine w/o contrast   No orders of the defined types were placed in this encounter.     Procedures: No procedures performed   Clinical Data: No additional findings.   Subjective: Chief Complaint  Patient presents with  . Lower Back - Pain  . Right Knee - Pain  . Right Hip - Pain    HPI 80 year old female returns with continued problems with right lower extremity pain.  She had injection in her knee 09/23/2020 but states it really did not help.  She had problems walking Sunday when she got out of bed with pain that radiates down to her legs she is noted sometimes she is catching her toe when she walks with right leg ankle weakness dorsiflexing.  She was in bed for 2 days with burning in her hip that radiates down her leg.  She is concerned that it might be her knee since that radiates down to her lateral calf.  She has had cardiac problems with multiple stents in the coronary arteries.  History of breast cancer.  Total hip arthroplasty on the left.  Previous MRI scan in 2016 showed 6 mm anterolisthesis at L4-5 with severe spinal stenosis which had  progressed on 2019 MRI performed on 10/03/2017.  Patient describes neurogenic claudication symptoms with standing and trying to walk more than a block.  Patient states long she leans on a grocery cart she can ambulate long distance.  Review of Systems all other systems noncontributory to HPI.   Objective: Vital Signs: BP 92/61   Pulse 75   Ht 5\' 5"  (1.651 m)   Wt 211 lb (95.7 kg)   BMI 35.11 kg/m   Physical Exam Constitutional:      Appearance: She is well-developed.  HENT:     Head: Normocephalic.     Right Ear: External ear normal.     Left Ear: External ear normal.  Eyes:     Pupils: Pupils are equal, round, and reactive to light.  Neck:     Thyroid: No thyromegaly.     Trachea: No tracheal deviation.  Cardiovascular:     Rate and Rhythm: Normal rate.  Pulmonary:     Effort: Pulmonary effort is normal.  Abdominal:     Palpations: Abdomen is soft.  Skin:    General: Skin is warm and dry.  Neurological:     Mental Status: She is alert and oriented to person, place, and time.  Psychiatric:        Behavior: Behavior  normal.     Ortho Exam patient has right EHL 3+ 4- out of 5 anterior tib on the right is 5- out of 5.  Gastrocsoleus is strong.  Opposite left side shows no weakness ankle or toe dorsiflexion.  Specialty Comments:  No specialty comments available.  Imaging: No results found.   PMFS History: Patient Active Problem List   Diagnosis Date Noted  . H/O total hip arthroplasty 12/27/2019  . H/O total hip arthroplasty, left 12/26/2019  . Spinal stenosis of lumbosacral region 09/22/2017  . CAP (community acquired pneumonia) 09/04/2014  . Breast cancer of upper-outer quadrant of right female breast ([place]) 08/27/2014  . Coronary atherosclerosis of native coronary artery 08/18/2013  . S/P PTCA (percutaneous transluminal coronary angioplasty) 08/17/2013   Past Medical History:  Diagnosis Date  . Anemia    in younger years  . Anginal pain ([place])   . Aortic  stenosis    mild to moderate AS 07/2013; moderate AS 08/2019   . Arthritis    "hands; legs" (08/17/2013)  . CAP (community acquired pneumonia)   . Coronary artery disease    6 stents in 2015  . GERD (gastroesophageal reflux disease)   . H/O hiatal hernia   . Heart murmur   . High cholesterol   . Hx of adenomatous colonic polyps 08/15/2018  . Hypertension   . Pneumonia 1970's   "once"  . Radiation 11/28/14-01/02/15   Right breast 50 Gray  . Vertigo     Family History  Problem Relation Age of Onset  . Cancer Brother   . Colon cancer Brother   . Cancer Brother   . Heart attack Brother   . Esophageal cancer Neg Hx   . Stomach cancer Neg Hx   . Rectal cancer Neg Hx     Past Surgical History:  Procedure Laterality Date  . ABDOMINAL HYSTERECTOMY  1985  . APPENDECTOMY  1960  . BREAST LUMPECTOMY WITH NEEDLE LOCALIZATION AND AXILLARY SENTINEL LYMPH NODE BX Right 10/17/2014   Procedure: BREAST LUMPECTOMY WITH NEEDLE LOCALIZATION AND AXILLARY SENTINEL LYMPH NODE BX;  Surgeon: Jackolyn Confer, MD;  Location: [facility];  Service: General;  Laterality: Right;  . CARDIAC CATHETERIZATION  08/07/2013  . COLONOSCOPY    . CORONARY ANGIOPLASTY WITH STENT PLACEMENT  08/17/2013   "6 stents"(08/17/2013)  . LEFT AND RIGHT HEART CATHETERIZATION WITH CORONARY ANGIOGRAM N/A 08/07/2013   Procedure: LEFT AND RIGHT HEART CATHETERIZATION WITH CORONARY ANGIOGRAM;  Surgeon: Laverda Page, MD;  Location: [facility] CATH LAB;  Service: Cardiovascular;  Laterality: N/A;  . PERCUTANEOUS CORONARY STENT INTERVENTION (PCI-S) N/A 08/17/2013   Procedure: PERCUTANEOUS CORONARY STENT INTERVENTION (PCI-S);  Surgeon: Laverda Page, MD;  Location: [facility] CATH LAB;  Service: Cardiovascular;  Laterality: N/A;  . TOTAL HIP ARTHROPLASTY Right 12/19/2017   Procedure: RIGHT TOTAL HIP ARTHROPLASTY ANTERIOR APPROACH;  Surgeon: Marybelle Killings, MD;  Location: [facility];  Service: Orthopedics;  Laterality: Right;  . TOTAL HIP ARTHROPLASTY Left 12/26/2019    Procedure: TOTAL HIP ARTHROPLASTY-DIRECT ANTERIOR;  Surgeon: Marybelle Killings, MD;  Location: [place] [facility];  Service: Orthopedics;  Laterality: Left;  . TUBAL LIGATION  1980   Social History   Occupational History  . Not on file  Tobacco Use  . Smoking status: Former Smoker    Packs/day: 1.00    Years: 52.00    Pack years: 52.00    Types: Cigarettes    Quit date: 04/04/2014    Years since quitting: 6.6  . Smokeless tobacco: Never Used  Vaping Use  . Vaping Use: Never used  Substance and Sexual Activity  . Alcohol use: No    Comment: 08/17/2013 "quit drinking at age 89; never did drink much"  . Drug use: No  . Sexual activity: Never

## 2020-11-29 ENCOUNTER — Other Ambulatory Visit: Payer: Self-pay | Admitting: Hematology and Oncology

## 2020-12-10 ENCOUNTER — Emergency Department (HOSPITAL_COMMUNITY)
Admission: EM | Admit: 2020-12-10 | Discharge: 2020-12-10 | Disposition: A | Discharge status: Home / Self Care | Payer: Medicare HMO | Attending: Emergency Medicine | Admitting: Emergency Medicine

## 2020-12-10 ENCOUNTER — Emergency Department (HOSPITAL_COMMUNITY): Payer: Medicare HMO

## 2020-12-10 ENCOUNTER — Other Ambulatory Visit: Payer: Self-pay

## 2020-12-10 DIAGNOSIS — Z79899 Other long term (current) drug therapy: Secondary | ICD-10-CM | POA: Diagnosis not present

## 2020-12-10 DIAGNOSIS — Z96653 Presence of artificial knee joint, bilateral: Secondary | ICD-10-CM | POA: Diagnosis not present

## 2020-12-10 DIAGNOSIS — Z87891 Personal history of nicotine dependence: Secondary | ICD-10-CM | POA: Insufficient documentation

## 2020-12-10 DIAGNOSIS — M545 Low back pain, unspecified: Secondary | ICD-10-CM | POA: Diagnosis not present

## 2020-12-10 DIAGNOSIS — M5416 Radiculopathy, lumbar region: Secondary | ICD-10-CM

## 2020-12-10 DIAGNOSIS — I2510 Atherosclerotic heart disease of native coronary artery without angina pectoris: Secondary | ICD-10-CM | POA: Insufficient documentation

## 2020-12-10 DIAGNOSIS — Z853 Personal history of malignant neoplasm of breast: Secondary | ICD-10-CM | POA: Insufficient documentation

## 2020-12-10 DIAGNOSIS — Z7982 Long term (current) use of aspirin: Secondary | ICD-10-CM | POA: Diagnosis not present

## 2020-12-10 DIAGNOSIS — R202 Paresthesia of skin: Secondary | ICD-10-CM | POA: Diagnosis not present

## 2020-12-10 DIAGNOSIS — I10 Essential (primary) hypertension: Secondary | ICD-10-CM | POA: Diagnosis not present

## 2020-12-10 MED ORDER — METHYLPREDNISOLONE 4 MG PO TBPK
ORAL_TABLET | ORAL | 0 refills | Status: DC
Start: 2020-12-10 — End: 2022-02-28

## 2020-12-10 MED ORDER — HYDROCODONE-ACETAMINOPHEN 5-325 MG PO TABS
1.0000 | ORAL_TABLET | Freq: Once | ORAL | Status: AC
  Administered 2020-12-10: 1 via ORAL
  Filled 2020-12-10: qty 1

## 2020-12-10 MED ORDER — PREDNISONE 20 MG PO TABS
40.0000 mg | ORAL_TABLET | Freq: Once | ORAL | Status: AC
  Administered 2020-12-10: 40 mg via ORAL
  Filled 2020-12-10: qty 2

## 2020-12-10 MED ORDER — HYDROCODONE-ACETAMINOPHEN 5-325 MG PO TABS: 1.0000 | ORAL_TABLET | Freq: Three times a day (TID) | ORAL | 0 refills | Status: DC | PRN

## 2020-12-10 MED ORDER — LIDOCAINE 5 % EX PTCH
1.0000 | MEDICATED_PATCH | Freq: Once | CUTANEOUS | Status: DC
  Administered 2020-12-10: 1 via TRANSDERMAL
  Filled 2020-12-10: qty 1

## 2020-12-10 MED ORDER — ACETAMINOPHEN 325 MG PO TABS
650.0000 mg | ORAL_TABLET | Freq: Once | ORAL | Status: AC
  Administered 2020-12-10: 650 mg via ORAL
  Filled 2020-12-10: qty 2

## 2020-12-10 NOTE — ED Triage Notes (Signed)
Patient reports R leg tingling and numbness x 1 week, hx of hip replacement in that leg, also with some back pain, went to PCP and was told it was probably related to her back

## 2020-12-10 NOTE — ED Provider Notes (Signed)
[facility] EMERGENCY DEPARTMENT Provider Note   CSN: [id number] Arrival date & time: 12/10/20  0526     History Chief Complaint  Patient presents with  . Leg Pain    Carolyn Rowland is a 80 y.o. female.  Carolyn Rowland is a 80 y.o. female with a history of hypertension, hyperlipidemia, GERD, CAD, arthritis, spinal stenosis, who presents to the emergency department for evaluation of pain in her low back and right leg.  She reports pain has been ongoing over the past 2 weeks primarily in her midline and right low back with pain radiating down into her right leg.  She reports over the past few days she has had some intermittent tingling and paresthesias primarily over the lateral calf.  She has not noticed any swelling or discoloration.  Denies weakness and reports she has been able to ambulate, has been using a cane for assistance due to pain.  She reports that she has been following with Dr. Lorin Mercy with orthopedics for this pain, he has told her that she will likely need surgery and is scheduled her for an outpatient MRI on Sunday for further evaluation of this.  Patient reports that she took ibuprofen and Tylenol at home and had been prescribed some tramadol without improvement.  Pain got severe, she could not sleep but did not know what else to do so came to the hospital for further evaluation.  Denies any loss of bowel or bladder control or saddle anesthesia.  No associated urinary symptoms, no abdominal pain, no fevers or chills.  No new injury or trauma.  No new or significant change to her symptoms since following with Dr. Lorin Mercy.  The history is provided by the patient and medical records.       Past Medical History:  Diagnosis Date  . Anemia    in younger years  . Anginal pain ([place])   . Aortic stenosis    mild to moderate AS 07/2013; moderate AS 08/2019   . Arthritis    "hands; legs" (08/17/2013)  . CAP (community acquired pneumonia)   . Coronary artery disease    6  stents in 2015  . GERD (gastroesophageal reflux disease)   . H/O hiatal hernia   . Heart murmur   . High cholesterol   . Hx of adenomatous colonic polyps 08/15/2018  . Hypertension   . Pneumonia 1970's   "once"  . Radiation 11/28/14-01/02/15   Right breast [address]  . Vertigo     Patient Active Problem List   Diagnosis Date Noted  . H/O total hip arthroplasty 12/27/2019  . H/O total hip arthroplasty, left 12/26/2019  . Spinal stenosis of lumbosacral region 09/22/2017  . CAP (community acquired pneumonia) 09/04/2014  . Breast cancer of upper-outer quadrant of right female breast ([place]) 08/27/2014  . Coronary atherosclerosis of native coronary artery 08/18/2013  . S/P PTCA (percutaneous transluminal coronary angioplasty) 08/17/2013    Past Surgical History:  Procedure Laterality Date  . ABDOMINAL HYSTERECTOMY  1985  . APPENDECTOMY  1960  . BREAST LUMPECTOMY WITH NEEDLE LOCALIZATION AND AXILLARY SENTINEL LYMPH NODE BX Right 10/17/2014   Procedure: BREAST LUMPECTOMY WITH NEEDLE LOCALIZATION AND AXILLARY SENTINEL LYMPH NODE BX;  Surgeon: Jackolyn Confer, MD;  Location: [facility];  Service: General;  Laterality: Right;  . CARDIAC CATHETERIZATION  08/07/2013  . COLONOSCOPY    . CORONARY ANGIOPLASTY WITH STENT PLACEMENT  08/17/2013   "6 stents"(08/17/2013)  . LEFT AND RIGHT HEART CATHETERIZATION WITH CORONARY ANGIOGRAM  N/A 08/07/2013   Procedure: LEFT AND RIGHT HEART CATHETERIZATION WITH CORONARY ANGIOGRAM;  Surgeon: Laverda Page, MD;  Location: [facility] CATH LAB;  Service: Cardiovascular;  Laterality: N/A;  . PERCUTANEOUS CORONARY STENT INTERVENTION (PCI-S) N/A 08/17/2013   Procedure: PERCUTANEOUS CORONARY STENT INTERVENTION (PCI-S);  Surgeon: Laverda Page, MD;  Location: [facility] CATH LAB;  Service: Cardiovascular;  Laterality: N/A;  . TOTAL HIP ARTHROPLASTY Right 12/19/2017   Procedure: RIGHT TOTAL HIP ARTHROPLASTY ANTERIOR APPROACH;  Surgeon: Marybelle Killings, MD;  Location: [facility];  Service:  Orthopedics;  Laterality: Right;  . TOTAL HIP ARTHROPLASTY Left 12/26/2019   Procedure: TOTAL HIP ARTHROPLASTY-DIRECT ANTERIOR;  Surgeon: Marybelle Killings, MD;  Location: [facility];  Service: Orthopedics;  Laterality: Left;  . TUBAL LIGATION  1980     OB History   No obstetric history on file.     Family History  Problem Relation Age of Onset  . Cancer Brother   . Colon cancer Brother   . Cancer Brother   . Heart attack Brother   . Esophageal cancer Neg Hx   . Stomach cancer Neg Hx   . Rectal cancer Neg Hx     Social History   Tobacco Use  . Smoking status: Former Smoker    Packs/day: 1.00    Years: 52.00    Pack years: 52.00    Types: Cigarettes    Quit date: 04/04/2014    Years since quitting: 6.6  . Smokeless tobacco: Never Used  Vaping Use  . Vaping Use: Never used  Substance Use Topics  . Alcohol use: No    Comment: 08/17/2013 "quit drinking at age 15; never did drink much"  . Drug use: No    Home Medications Prior to Admission medications   Medication Sig Start Date End Date Taking? Authorizing Provider  alendronate (FOSAMAX) 70 MG tablet TAKE 1 TABLET BY MOUTH ONCE A WEEK. TAKE WITH A FULL GLASS OF WATER ON AN EMPTY STOMACH. Patient taking differently: Take 70 mg by mouth every Wednesday. 05/15/19   Nicholas Lose, MD  amLODipine (NORVASC) 2.5 MG tablet Take 2.5 mg by mouth daily.    [provider]  anastrozole (ARIMIDEX) 1 MG tablet TAKE 1 TABLET BY MOUTH EVERY DAY 11/29/20   Nicholas Lose, MD  aspirin 81 MG EC tablet Take 81 mg by mouth daily.     [provider]  Cholecalciferol (VITAMIN D3) 400 units CAPS Take 400 Units by mouth daily.    [provider]  CRESTOR 20 MG tablet Take 20 mg by mouth daily.  12/30/14   [provider]  isosorbide mononitrate (IMDUR) 60 MG 24 hr tablet Take 1 tablet (60 mg total) by mouth daily. 08/07/13   Adrian Prows, MD  metoprolol succinate (TOPROL-XL) 50 MG 24 hr tablet Take 50 mg by mouth daily. Take  with or immediately following a meal.    [provider]  montelukast (SINGULAIR) 10 MG tablet Take 10 mg by mouth daily. 01/16/20   [provider]  Multiple Minerals-Vitamins (CALCIUM & VIT D3 BONE HEALTH PO) Take 1 tablet by mouth 2 (two) times a week.    [provider]  nitroGLYCERIN (NITROSTAT) 0.4 MG SL tablet Place 1 tablet (0.4 mg total) under the tongue every 5 (five) minutes as needed for chest pain. 08/07/13   Adrian Prows, MD  omeprazole (PRILOSEC) 40 MG capsule Take 40 mg by mouth daily as needed (acid reflux).  11/14/17   [provider]  oxyCODONE (  ROXICODONE) 5 MG immediate release tablet Take 1 tablet (5 mg total) by mouth every 6 (six) hours as needed. Post op pain 01/16/20 01/15/21  Marybelle Killings, MD  spironolactone (ALDACTONE) 25 MG tablet Take 25 mg by mouth daily.    [provider]  traMADol (ULTRAM) 50 MG tablet Take 1 tablet (50 mg total) by mouth every 12 (twelve) hours as needed. 11/13/20   Marybelle Killings, MD    Allergies    Penicillins  Review of Systems   Review of Systems  Constitutional: Negative for chills and fever.  HENT: Negative.   Respiratory: Negative for shortness of breath.   Cardiovascular: Negative for chest pain.  Gastrointestinal: Negative for abdominal pain, constipation, diarrhea, nausea and vomiting.  Genitourinary: Negative for dysuria, flank pain, frequency and hematuria.  Musculoskeletal: Positive for back pain. Negative for arthralgias, gait problem, joint swelling, myalgias and neck pain.  Skin: Negative for color change, rash and wound.  Neurological: Negative for weakness and numbness.  All other systems reviewed and are negative.   Physical Exam Updated Vital Signs BP (!) 162/88 (BP Location: Left Arm)   Pulse 63   Temp 98.5 F (36.9 C)   Resp 17   Ht 5\' 5"  (1.651 m)   Wt 95.7 kg   SpO2 100%   BMI 35.11 kg/m   Physical Exam Vitals and nursing note reviewed.  Constitutional:       General: She is not in acute distress.    Appearance: She is well-developed. She is not diaphoretic.  HENT:     Head: Atraumatic.  Eyes:     General:        Right eye: No discharge.        Left eye: No discharge.  Cardiovascular:     Rate and Rhythm: Normal rate and regular rhythm.     Pulses: Normal pulses.          Radial pulses are 2+ on the right side and 2+ on the left side.       Dorsalis pedis pulses are 2+ on the right side and 2+ on the left side.       Posterior tibial pulses are 2+ on the right side and 2+ on the left side.     Heart sounds: Normal heart sounds.  Pulmonary:     Effort: Pulmonary effort is normal. No respiratory distress.     Breath sounds: Normal breath sounds.  Abdominal:     General: Bowel sounds are normal. There is no distension.     Palpations: Abdomen is soft. There is no mass.     Tenderness: There is no abdominal tenderness. There is no guarding.     Comments: Abdomen soft, nondistended, nontender to palpation in all quadrants without guarding or peritoneal signs, no CVA tenderness bilaterally  Musculoskeletal:     Cervical back: Neck supple.     Comments: Tenderness to palpation over right low back, no palpable deformity or skin changes.  Pain made worse with range of motion of the lower extremities, positive straight leg raise on the right.  Skin:    General: Skin is warm and dry.     Capillary Refill: Capillary refill takes less than 2 seconds.  Neurological:     Mental Status: She is alert and oriented to person, place, and time.     Comments: Alert, clear speech, following commands. Moving all extremities without difficulty. Bilateral lower extremities with 5/5 strength in proximal and distal muscle groups and  with dorsi and plantar flexion. Sensation intact in bilateral lower extremities. Does reports ome paresthesia with palpation over the right lateral calf 2+ patellar DTRs bilaterally. Ambulatory with steady gait  Psychiatric:         Mood and Affect: Mood normal.        Behavior: Behavior normal.     ED Results / Procedures / Treatments   Labs (all labs ordered are listed, but only abnormal results are displayed) Labs Reviewed - No data to display  EKG None  Radiology DG Lumbar Spine Complete  Result Date: 12/10/2020 CLINICAL DATA:  Hervey Ard lower back pain on the right EXAM: LUMBAR SPINE - COMPLETE 4+ VIEW COMPARISON:  04/11/2019 FINDINGS: No evidence of acute fracture or erosion. 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 Disc space narrowing most notable at L4-5. Bilateral hip arthroplasty. Abdominal aortic atherosclerosis. IMPRESSION: No acute finding. Lumbar degeneration most notable at L4-5 where there is chronic anterolisthesis. Electronically Signed   By: Monte Fantasia M.D.   On: 12/10/2020 06:57    Procedures Procedures   Medications Ordered in ED Medications  lidocaine (LIDODERM) 5 % 1 patch (1 patch Transdermal Patch Applied 12/10/20 1111)  HYDROcodone-acetaminophen (NORCO/VICODIN) 5-325 MG per tablet 1 tablet (1 tablet Oral Given 12/10/20 1112)  acetaminophen (TYLENOL) tablet 650 mg (650 mg Oral Given 12/10/20 1112)  predniSONE (DELTASONE) tablet 40 mg (40 mg Oral Given 12/10/20 1112)    ED Course  I have reviewed the triage vital signs and the nursing notes.  Pertinent labs & imaging results that were available during my care of the patient were reviewed by me and considered in my medical decision making (see chart for details).    MDM Rules/Calculators/A&P                         80 yo female presents with right low back pain radiating into her right leg, she has known lumbar spinal stenosis and is being followed by Dr. Lorin Mercy.  Is scheduled for an outpatient MRI in 4 days.  Came in today due to worsening pain impacting her sleep.  Has had some paresthesias primarily over the right lateral calf but these are unchanged.  No new weakness on exam, no loss of  bowel or bladder control or saddle anesthesia.  No red flag symptoms.  No new traumatic injury.  X-ray shows lumbar degenerative changes most notable at L4-5.  Will focus on pain control, do not feel the patient has indication for more emergent MRI and we will have her continue to follow outpatient with Dr. Lorin Mercy.  Pain treated and improved here in the ED.  Prescribed pain medication and Medrol Dosepak.  Stressed the importance of follow-up and receiving her MRI as planned on Sunday.  Discussed appropriate return precautions.  Patient expresses understanding and agreement.  Discharged home in good condition.  Final Clinical Impression(s) / ED Diagnoses Final diagnoses:  Lumbar radiculopathy    Rx / DC Orders ED Discharge Orders         Ordered    methylPREDNISolone (MEDROL DOSEPAK) 4 MG TBPK tablet        12/10/20 1216    HYDROcodone-acetaminophen (NORCO) 5-325 MG tablet  Every 8 hours PRN        06 /08/22 1216           Jacqlyn Larsen, [place] 12/10/20 1245    Pattricia Boss, MD 12/10/20 1324

## 2020-12-10 NOTE — Discharge Instructions (Signed)
Follow-up for your MRI on Sunday as planned and continue to follow-up with Dr. Lorin Mercy.  You have known lumbar spinal stenosis that is causing these problems, it can cause tingling in your leg.  Continue using Tylenol 650 mg every 6 hours for pain, you can use prescribed Norco for severe breakthrough pain, but use extreme caution when taking this medication as it can cause drowsiness.  Discontinue tramadol prescribed by her doctor since it did not help at all with your pain.  These medications are not safe to take together.  If you develop new or worsening numbness or weakness, loss of control of your bowels or bladder, fever or any other concerning symptoms you can return to the ED otherwise follow-up with orthopedics as planned.

## 2020-12-10 NOTE — ED Provider Notes (Signed)
Emergency Medicine Provider Triage Evaluation Note  Carolyn Rowland , a 80 y.o. female  was evaluated in triage.  Pt complains of right leg paresthesias and pain onset 1 week ago.  Pt describes the pain as a burning pain. Pt reports bilateral hip replacements. Pt reports 2 weeks of low back pain.  Reports she saw her PCP who felt these symptoms were from her back. Denies recent falls or known trauma.  Pt uses a cane to assist with walking.   Review of Systems  Positive: Right leg pain, back pain Negative: CP, SOB  Physical Exam  BP 138/72 (BP Location: Left Arm)   Pulse 66   Temp 98.5 F (36.9 C)   Resp 16   SpO2 97%  Gen:   Awake, no distress   Resp:  Normal effort  MSK:   Antalgic gait with cane Other:  TTP midline L spine, palpation of the right paraspinal muscles induces paresthesias in the right leg.  Medical Decision Making  Medically screening exam initiated at 6:18 AM.  Appropriate orders placed.  Jemiah Cuadra Hoopingarner was informed that the remainder of the evaluation will be completed by another provider, this initial triage assessment does not replace that evaluation, and the importance of remaining in the ED until their evaluation is complete.  Back pain, sciatica - pt requesting x-ray of her back.     Solace Wendorff, Gwenlyn Perking 12/10/20 4446    Ripley Fraise, MD 12/10/20 [phone]

## 2020-12-10 NOTE — ED Notes (Signed)
Patient Alert and oriented to baseline. Stable and ambulatory to baseline. Patient verbalized understanding of the discharge instructions.  Patient belongings were taken by the patient.   

## 2020-12-14 ENCOUNTER — Other Ambulatory Visit: Payer: Self-pay

## 2020-12-14 ENCOUNTER — Ambulatory Visit
Admission: RE | Admit: 2020-12-14 | Discharge: 2020-12-14 | Disposition: A | Discharge status: Home / Self Care | Payer: Medicare HMO | Source: Ambulatory Visit | Attending: Orthopaedic Surgery | Admitting: Orthopaedic Surgery

## 2020-12-14 DIAGNOSIS — M48061 Spinal stenosis, lumbar region without neurogenic claudication: Secondary | ICD-10-CM | POA: Diagnosis not present

## 2020-12-14 DIAGNOSIS — M4807 Spinal stenosis, lumbosacral region: Secondary | ICD-10-CM

## 2020-12-16 ENCOUNTER — Encounter: Payer: Self-pay | Admitting: Orthopaedic Surgery

## 2020-12-16 ENCOUNTER — Ambulatory Visit: Payer: Self-pay

## 2020-12-16 ENCOUNTER — Ambulatory Visit (INDEPENDENT_AMBULATORY_CARE_PROVIDER_SITE_OTHER): Payer: Medicare HMO | Admitting: Orthopaedic Surgery

## 2020-12-16 ENCOUNTER — Telehealth: Payer: Self-pay | Admitting: Orthopaedic Surgery

## 2020-12-16 VITALS — BP 139/82 | HR 98 | Ht 65.0 in | Wt 210.0 lb

## 2020-12-16 DIAGNOSIS — M4807 Spinal stenosis, lumbosacral region: Secondary | ICD-10-CM

## 2020-12-16 DIAGNOSIS — M21371 Foot drop, right foot: Secondary | ICD-10-CM

## 2020-12-16 DIAGNOSIS — M5126 Other intervertebral disc displacement, lumbar region: Secondary | ICD-10-CM

## 2020-12-16 MED ORDER — HYDROCODONE-ACETAMINOPHEN 5-325 MG PO TABS: 1.0000 | ORAL_TABLET | Freq: Four times a day (QID) | ORAL | 0 refills | Status: DC | PRN

## 2020-12-16 NOTE — Telephone Encounter (Signed)
I called patient and advised. Appt made for 1pm this afternoon.

## 2020-12-16 NOTE — Telephone Encounter (Signed)
Please advise 

## 2020-12-16 NOTE — Progress Notes (Signed)
Office Visit Note   Patient: Carolyn Rowland           Date of Birth: 04/03/1941           MRN: [id number] Visit Date: 12/16/2020              Requested by: Fanny Bien, [address] [place],  [place] [address] PCP: Fanny Bien, MD   Assessment & Plan: Visit Diagnoses:  1. Spinal stenosis of lumbosacral region   2. Lumbar herniated disc   3. Foot drop, right     Plan: Patient has severe stenosis L4-5 with progression from previous 6 mm now up to 10 mm anterolisthesis that increases to 13.4 mm with flexion.  Patient also has L5-S1 disc extrusion on the right with compression of right L5 and S1 nerve root superior migrated fragment.  Patient will require decompression and fusion L4-5 for progressive instability using pedicle instrumentation and cage.  Plan right L5-S1 microdiscectomy and removal of free fragment.  Discussed risks of recurrent disc herniation at L5-S1.  Risks of dural tear, reoperation, progression of lumbar disc degeneration.  She has severe stenosis at L4-5 and has had progressive anterolisthesis with progression of her stenosis now severe neurogenic claudication symptoms preventing her from ambulating.  Questions elicited and answered she understands request to proceed.  Follow-Up Instructions: No follow-ups on file.   Orders:  Orders Placed This Encounter  Procedures   XR Lumb Spine Flex&Ext Only   Meds ordered this encounter  Medications   HYDROcodone-acetaminophen (NORCO/VICODIN) 5-325 MG tablet    Sig: Take 1-2 tablets by mouth every 6 (six) hours as needed for moderate pain.    Dispense:  30 tablet    Refill:  0      Procedures: No procedures performed   Clinical Data: No additional findings.   Subjective: Chief Complaint  Patient presents with   Lower Back - Pain, Follow-up    MRI review    HPI 80 year old female returns with persistent problems with severe back pain worse right leg pain and left leg pain.  Patient's  had severe spinal stenosis dating back prior to 2016 with gradual progression on MRI 2019 and progressive right foot drop. Patient's had falling is unable to walk more than half a block now.  She has had trouble going to the grocery store cannot go she cannot find a cart.  She been in bed for multiple days that time when she is able to only ambulate to the bathroom. Review of Systems previous cardiac catheterization with PTCA.  No active chest pain angina.  Previous left total of arthroplasty.  Right breast cancer 2016 without recurrence.  Lumbar spinal stenosis with neurogenic claudication.  All other systems noncontributory HPI.   Objective: Vital Signs: BP 139/82   Pulse 98   Ht 5\' 5"  (1.651 m)   Wt 210 lb (95.3 kg)   BMI 34.95 kg/m   Physical Exam Constitutional:      Appearance: She is well-developed.  HENT:     Head: Normocephalic.     Right Ear: External ear normal.     Left Ear: External ear normal. There is no impacted cerumen.  Eyes:     Pupils: Pupils are equal, round, and reactive to light.  Neck:     Thyroid: No thyromegaly.     Trachea: No tracheal deviation.  Cardiovascular:     Rate and Rhythm: Normal rate.  Pulmonary:     Effort: Pulmonary  effort is normal.  Abdominal:     Palpations: Abdomen is soft.  Musculoskeletal:     Cervical back: No rigidity.  Skin:    General: Skin is warm and dry.  Neurological:     Mental Status: She is alert and oriented to person, place, and time.  Psychiatric:        Behavior: Behavior normal.    Ortho Exam patient has 4-5 anterior tib EHL on the right normal on the left.  Positive straight leg raising on the right 70 degrees positive popliteal compression test positive sciatic notch tenderness.  Slight weakness gastrocsoleus right and left.  She is unable to heel walk cannot toe walk.  Specialty Comments:  No specialty comments available.  Imaging: XR Lumb Spine Flex&Ext Only  Result Date: 12/16/2020   2 view  lateral  flexion-extension lumbar x-rays are obtained reviewed this shows shifting from 10 to 13.4 mm at the L4-5 level Impression: Progressive L4-5 anterolisthesis previously noted on MRI to be 6 mm now shifting to 13 mm on flexion-extension radiographs.   CLINICAL DATA:  Initial evaluation for severe stenosis at L4-5 with right L5 weakness.   EXAM: MRI LUMBAR SPINE WITHOUT CONTRAST   TECHNIQUE: Multiplanar, multisequence MR imaging of the lumbar spine was performed. No intravenous contrast was administered.   COMPARISON:  Previous MRI from 10/03/2017.   FINDINGS: Segmentation:  Standard.   Alignment: 6 mm anterolisthesis of L4 on L5, with trace anterolisthesis of L3 on L4. Underlying mild scoliotic curvature.   Vertebrae: Vertebral body height maintained without acute or interval fracture. Bone marrow signal intensity within normal limits. Few small benign hemangiomata noted. No worrisome osseous lesions. No abnormal marrow edema.   Conus medullaris and cauda equina: Conus extends to the L1 level. Conus and cauda equina appear normal.   Paraspinal and other soft tissues: Paraspinous soft tissues within normal limits. Few benign-appearing subcentimeter cyst noted about the kidneys bilaterally. Visualized visceral structures otherwise unremarkable.   Disc levels:   T10-11: Seen only on sagittal projection. Mild disc bulge with disc desiccation. Mild facet hypertrophy. No significant spinal stenosis. Mild bilateral foraminal narrowing.   T11-12: Unremarkable.   T12-L1: Unremarkable.   L1-2:  Unremarkable.   L2-3: Minimal disc bulge with mild facet and ligament flavum hypertrophy. Resultant mild canal with bilateral lateral recess stenosis. Mild bilateral L2 foraminal narrowing.   L3-4: Trace anterolisthesis. Disc desiccation with mild disc bulge. Mild to moderate facet and ligament flavum hypertrophy. Resultant mild narrowing of the lateral recesses, left greater than  right. Mild right with mild to moderate left L3 foraminal narrowing.   L4-5: 6 mm anterolisthesis. Degenerative intervertebral disc space narrowing with disc desiccation and broad posterior pseudo disc bulge/uncovering. Severe bilateral facet arthrosis. Resultant severe canal with bilateral subarticular stenosis, with moderate to severe bilateral L4 foraminal narrowing. Either of the L4 or descending L5 nerve roots could be affected.   L5-S1: Disc desiccation. Superimposed right subarticular disc extrusion with superior migration (series 7, images 40, 37). Disc material contacts both the right L5 and descending S1 nerve roots. This is likely the symptomatic finding, and is new from prior. Superimposed mild facet and ligament flavum hypertrophy. Resultant mild canal with moderate right lateral recess stenosis. Moderate bilateral L5 foraminal narrowing.   IMPRESSION: 1. New right subarticular disc extrusion with superior migration at L5-S1, contacting and potentially irritating either of the right L5 or descending S1 nerve roots. This is likely the symptomatic level. 2. Multifactorial degenerative changes at L4-5 with resultant  severe canal and bilateral subarticular stenosis, with moderate to severe bilateral L4 foraminal narrowing. 3. Disc bulging with facet hypertrophy at L2-3 and L3-4 with resultant mild canal and bilateral lateral recess stenosis.     Electronically Signed   By: Jeannine Boga M.D.   On: 12/15/2020 04:41  PMFS History: Patient Active Problem List   Diagnosis Date Noted   Lumbar herniated disc 12/16/2020   Foot drop, right 12/16/2020   H/O total hip arthroplasty 12/27/2019   H/O total hip arthroplasty, left 12/26/2019   Spinal stenosis of lumbosacral region 09/22/2017   CAP (community acquired pneumonia) 09/04/2014   Breast cancer of upper-outer quadrant of right female breast ([place]) 08/27/2014   Coronary atherosclerosis of native coronary artery  08/18/2013   S/P PTCA (percutaneous transluminal coronary angioplasty) 08/17/2013   Past Medical History:  Diagnosis Date   Anemia    in younger years   Anginal pain ([place])    Aortic stenosis    mild to moderate AS 07/2013; moderate AS 08/2019    Arthritis    "hands; legs" (08/17/2013)   CAP (community acquired pneumonia)    Coronary artery disease    6 stents in 2015   GERD (gastroesophageal reflux disease)    H/O hiatal hernia    Heart murmur    High cholesterol    Hx of adenomatous colonic polyps 08/15/2018   Hypertension    Pneumonia 1970's   "once"   Radiation 11/28/14-01/02/15   Right breast 50 Gray   Vertigo     Family History  Problem Relation Age of Onset   Cancer Brother    Colon cancer Brother    Cancer Brother    Heart attack Brother    Esophageal cancer Neg Hx    Stomach cancer Neg Hx    Rectal cancer Neg Hx     Past Surgical History:  Procedure Laterality Date   ABDOMINAL HYSTERECTOMY  1985   APPENDECTOMY  1960   BREAST LUMPECTOMY WITH NEEDLE LOCALIZATION AND AXILLARY SENTINEL LYMPH NODE BX Right 10/17/2014   Procedure: BREAST LUMPECTOMY WITH NEEDLE LOCALIZATION AND AXILLARY SENTINEL LYMPH NODE BX;  Surgeon: Jackolyn Confer, MD;  Location: [facility];  Service: General;  Laterality: Right;   CARDIAC CATHETERIZATION  08/07/2013   COLONOSCOPY     CORONARY ANGIOPLASTY WITH STENT PLACEMENT  08/17/2013   "6 stents"(08/17/2013)   LEFT AND RIGHT HEART CATHETERIZATION WITH CORONARY ANGIOGRAM N/A 08/07/2013   Procedure: LEFT AND RIGHT HEART CATHETERIZATION WITH CORONARY ANGIOGRAM;  Surgeon: Laverda Page, MD;  Location: [facility] CATH LAB;  Service: Cardiovascular;  Laterality: N/A;   PERCUTANEOUS CORONARY STENT INTERVENTION (PCI-S) N/A 08/17/2013   Procedure: PERCUTANEOUS CORONARY STENT INTERVENTION (PCI-S);  Surgeon: Laverda Page, MD;  Location: [facility] CATH LAB;  Service: Cardiovascular;  Laterality: N/A;   TOTAL HIP ARTHROPLASTY Right 12/19/2017   Procedure: RIGHT TOTAL HIP  ARTHROPLASTY ANTERIOR APPROACH;  Surgeon: Marybelle Killings, MD;  Location: [facility];  Service: Orthopedics;  Laterality: Right;   TOTAL HIP ARTHROPLASTY Left 12/26/2019   Procedure: TOTAL HIP ARTHROPLASTY-DIRECT ANTERIOR;  Surgeon: Marybelle Killings, MD;  Location: [facility];  Service: Orthopedics;  Laterality: Left;   TUBAL LIGATION  1980   Social History   Occupational History   Not on file  Tobacco Use   Smoking status: Former    Packs/day: 1.00    Years: 52.00    Pack years: 52.00    Types: Cigarettes    Quit date: 04/04/2014    Years since quitting:  6.7   Smokeless tobacco: Never  Vaping Use   Vaping Use: Never used  Substance and Sexual Activity   Alcohol use: No    Comment: 08/17/2013 "quit drinking at age 72; never did drink much"   Drug use: No   Sexual activity: Never

## 2020-12-16 NOTE — Telephone Encounter (Signed)
Pt called stating she is having excruciating pain in her back,hips, and side. She states she had an MRI done on 12/14/20 and would like a CB with the results.   [phone]

## 2021-01-05 ENCOUNTER — Other Ambulatory Visit: Payer: Self-pay | Admitting: Hematology and Oncology

## 2021-01-12 ENCOUNTER — Ambulatory Visit (HOSPITAL_COMMUNITY)
Admission: EM | Admit: 2021-01-12 | Discharge: 2021-01-12 | Disposition: A | Discharge status: Home / Self Care | Payer: Medicare HMO | Attending: Emergency Medicine | Admitting: Emergency Medicine

## 2021-01-12 ENCOUNTER — Encounter (HOSPITAL_COMMUNITY): Payer: Self-pay

## 2021-01-12 ENCOUNTER — Other Ambulatory Visit: Payer: Self-pay

## 2021-01-12 DIAGNOSIS — M25511 Pain in right shoulder: Secondary | ICD-10-CM | POA: Diagnosis not present

## 2021-01-12 DIAGNOSIS — U071 COVID-19: Secondary | ICD-10-CM | POA: Diagnosis not present

## 2021-01-12 DIAGNOSIS — J069 Acute upper respiratory infection, unspecified: Secondary | ICD-10-CM

## 2021-01-12 DIAGNOSIS — R059 Cough, unspecified: Secondary | ICD-10-CM | POA: Diagnosis not present

## 2021-01-12 DIAGNOSIS — Z87891 Personal history of nicotine dependence: Secondary | ICD-10-CM | POA: Insufficient documentation

## 2021-01-12 DIAGNOSIS — Z880 Allergy status to penicillin: Secondary | ICD-10-CM | POA: Insufficient documentation

## 2021-01-12 DIAGNOSIS — M1990 Unspecified osteoarthritis, unspecified site: Secondary | ICD-10-CM | POA: Diagnosis not present

## 2021-01-12 DIAGNOSIS — R232 Flushing: Secondary | ICD-10-CM | POA: Diagnosis not present

## 2021-01-12 DIAGNOSIS — J029 Acute pharyngitis, unspecified: Secondary | ICD-10-CM | POA: Diagnosis not present

## 2021-01-12 MED ORDER — GUAIFENESIN-DM 100-10 MG/5ML PO SYRP
5.0000 mL | ORAL_SOLUTION | ORAL | 0 refills | Status: DC | PRN
Start: 2021-01-12 — End: 2022-02-28

## 2021-01-12 MED ORDER — IBUPROFEN 600 MG PO TABS
600.0000 mg | ORAL_TABLET | Freq: Four times a day (QID) | ORAL | 0 refills | Status: DC | PRN
Start: 2021-01-12 — End: 2023-08-23

## 2021-01-12 MED ORDER — GUAIFENESIN-DM 100-10 MG/5ML PO SYRP: 5.0000 mL | ORAL_SOLUTION | ORAL | Status: DC | PRN

## 2021-01-12 NOTE — ED Triage Notes (Signed)
Pt reports itchy throat, hot flashes, runny nose starting yesterday. Was at a crowded funeral recently and wonders if she caught something. Pt also endorses tightness/pain to right shoulder.

## 2021-01-12 NOTE — Discharge Instructions (Addendum)
Covid test pending 24 hours, you will be called if positive   Continue use of montelukast every evening to help with congestion  Continue use of robitussin DM every 4 hours to help with cough congestion   Can use ibuprofen every 6 hours for fever, chills and shoulder pain

## 2021-01-12 NOTE — ED Provider Notes (Signed)
[facility]    CSN: [id number] Arrival date & time: 01/12/21  1219      History   Chief Complaint Chief Complaint  Patient presents with   Nasal Congestion   Sore Throat   Hot Flashes   Shoulder Pain    HPI Carolyn Rowland is a 80 y.o. female.   Pain presents with nasal congestion, productive cough,hot flashes, chills, fatigue, itchy throat and intermittent generalized headache beginning 3 days ago after attending crowded funeral. Doristine Bosworth of funeral has since tested positive for covid,.  Denies shortness of breath,  chest pain, wheezing, abdominal pain ,Nausea, vomiting, diarrhea. Decreased appetite but tolerating fluids. Used one singular tablet with no relief. Vaccinated   Having right shoulder pain after sleeping on it wrong, aching present wit range of motion. Range of motion intact. Denies numbness and tingling. Denies prior injury or trauma. Has history of osteoarthritis. Uses walker at baseline  Past Medical History:  Diagnosis Date   Anemia    in younger years   Anginal pain ([place])    Aortic stenosis    mild to moderate AS 07/2013; moderate AS 08/2019    Arthritis    "hands; legs" (08/17/2013)   CAP (community acquired pneumonia)    Coronary artery disease    6 stents in 2015   GERD (gastroesophageal reflux disease)    H/O hiatal hernia    Heart murmur    High cholesterol    Hx of adenomatous colonic polyps 08/15/2018   Hypertension    Pneumonia 1970's   "once"   Radiation 11/28/14-01/02/15   Right breast 50 Gray   Vertigo     Patient Active Problem List   Diagnosis Date Noted   Lumbar herniated disc 12/16/2020   Foot drop, right 12/16/2020   H/O total hip arthroplasty 12/27/2019   H/O total hip arthroplasty, left 12/26/2019   Spinal stenosis of lumbosacral region 09/22/2017   CAP (community acquired pneumonia) 09/04/2014   Breast cancer of upper-outer quadrant of right female breast ([place]) 08/27/2014   Coronary atherosclerosis of native coronary  artery 08/18/2013   S/P PTCA (percutaneous transluminal coronary angioplasty) 08/17/2013    Past Surgical History:  Procedure Laterality Date   [place] LUMPECTOMY WITH NEEDLE LOCALIZATION AND AXILLARY SENTINEL LYMPH NODE BX Right 10/17/2014   Procedure: BREAST LUMPECTOMY WITH NEEDLE LOCALIZATION AND AXILLARY SENTINEL LYMPH NODE BX;  Surgeon: Jackolyn Confer, MD;  Location: [facility];  Service: General;  Laterality: Right;   CARDIAC CATHETERIZATION  08/07/2013   COLONOSCOPY     CORONARY ANGIOPLASTY WITH STENT PLACEMENT  08/17/2013   "6 stents"(08/17/2013)   LEFT AND RIGHT HEART CATHETERIZATION WITH CORONARY ANGIOGRAM N/A 08/07/2013   Procedure: LEFT AND RIGHT HEART CATHETERIZATION WITH CORONARY ANGIOGRAM;  Surgeon: Laverda Page, MD;  Location[facility] C[facility] LAB;  Service: Cardiovascular;  Laterality: N/A;   PERCUTANEOUS CORONARY STENT INTERVENTION (PCI-S) N/A 08/17/2013   Procedure: PERCUTANEOUS CORONARY STENT INTERVENTION (PCI-S);  Surgeon: Laverda Page, MD;  Location: [facility] CATH LAB;  Service: Cardiovascular;  Laterality: N/A;   TOTAL HIP ARTHROPLASTY Right 12/19/2017   Procedure: RIGHT TOTAL HIP ARTHROPLASTY ANTERIOR APPROACH;  Surgeon: Marybelle Killings, MD;  Location: [facility];  Service: Orthopedics;  Laterality: Right;   TOTAL HIP ARTHROPLASTY Left 12/26/2019   Procedure: TOTAL HIP ARTHROPLASTY-DIRECT ANTERIOR;  Surgeon: Marybelle Killings, MD;  Location: [facility];  Service: Orthopedics;  Laterality: Left;   [place]  OB History   No obstetric history on file.      Home Medications    Prior to Admission medications   Medication Sig Start Date End Date Taking? Authorizing Provider  guaiFENesin-dextromethorphan (ROBITUSSIN DM) 100-10 MG/5ML syrup Take 5 mLs by mouth every 4 (four) hours as needed for cough. 01/12/21  Yes Vyolet Sakuma R, NP  ibuprofen (ADVIL) 600 MG tablet Take 1 tablet (600 mg total) by mouth every 6 (six) hours as needed.  01/12/21  Yes Eragon Hammond R, NP  alendronate (FOSAMAX) 70 MG tablet TAKE 1 TABLET BY MOUTH ONCE A WEEK. TAKE WITH A FULL GLASS OF WATER ON AN EMPTY STOMACH. Patient taking differently: Take 70 mg by mouth every Wednesday. 05/15/19   Nicholas Lose, MD  amLODipine (NORVASC) 2.5 MG tablet Take 2.5 mg by mouth daily.    [provider]  anastrozole (ARIMIDEX) 1 MG tablet TAKE 1 TABLET BY MOUTH EVERY DAY 11/29/20   Nicholas Lose, MD  aspirin 81 MG EC tablet Take 81 mg by mouth daily.     [provider]  Cholecalciferol (VITAMIN D3) 400 units CAPS Take 400 Units by mouth daily.    [provider]  CRESTOR 20 MG tablet Take 20 mg by mouth daily.  12/30/14   [provider]  HYDROcodone-acetaminophen (NORCO) 5-325 MG tablet Take 1 tablet by mouth every 8 (eight) hours as needed. 12/10/20   Jacqlyn Larsen, PA-C  HYDROcodone-acetaminophen (NORCO/VICODIN) 5-325 MG tablet Take 1-2 tablets by mouth every 6 (six) hours as needed for moderate pain. 12/16/20   Marybelle Killings, MD  isosorbide mononitrate (IMDUR) 60 MG 24 hr tablet Take 1 tablet (60 mg total) by mouth daily. 08/07/13   Adrian Prows, MD  methylPREDNISolone (MEDROL DOSEPAK) 4 MG TBPK tablet Take as directed 12/10/20   Jacqlyn Larsen, PA-C  metoprolol succinate (TOPROL-XL) 50 MG 24 hr tablet Take 50 mg by mouth daily. Take with or immediately following a meal.    [provider]  montelukast (SINGULAIR) 10 MG tablet Take 10 mg by mouth daily. 01/16/20   [provider]  Multiple Minerals-Vitamins (CALCIUM & VIT D3 BONE HEALTH PO) Take 1 tablet by mouth 2 (two) times a week.    [provider]  nitroGLYCERIN (NITROSTAT) 0.4 MG SL tablet Place 1 tablet (0.4 mg total) under the tongue every 5 (five) minutes as needed for chest pain. 08/07/13   Adrian Prows, MD  omeprazole (PRILOSEC) 40 MG capsule Take 40 mg by mouth daily as needed (acid reflux).  11/14/17   [provider]  spironolactone  (ALDACTONE) 25 MG tablet Take 25 mg by mouth daily.    [provider]    Family History Family History  Problem Relation Age of Onset   Cancer Brother    Colon cancer Brother    Cancer Brother    Heart attack Brother    Esophageal cancer Neg Hx    Stomach cancer Neg Hx    Rectal cancer Neg Hx     Social History Social History   Tobacco Use   Smoking status: Former    Packs/day: 1.00    Years: 52.00    Pack years: 52.00    Types: Cigarettes    Quit date: 04/04/2014    Years since quitting: 6.7   Smokeless tobacco: Never  Vaping Use   Vaping Use: Never used  Substance Use Topics   Alcohol use: No    Comment: 08/17/2013 "quit drinking at age 19;  never did drink much"   Drug use: No     Allergies   Penicillins   Review of Systems Review of Systems Defer to HPI    Physical Exam Triage Vital Signs ED Triage Vitals  Enc Vitals Group     BP 01/12/21 1321 138/84     Pulse Rate 01/12/21 1321 88     Resp 01/12/21 1321 18     Temp 01/12/21 1321 98.6 F (37 C)     Temp src --      SpO2 01/12/21 1321 94 %     Weight --      Height --      Head Circumference --      Peak Flow --      Pain Score 01/12/21 1320 6     Pain Loc --      Pain Edu? --      Excl. in [place]? --    No data found.  Updated Vital Signs BP 138/84   Pulse 88   Temp 98.6 F (37 C)   Resp 18   SpO2 94%   Visual Acuity Right Eye Distance:   Left Eye Distance:   Bilateral Distance:    Right Eye Near:   Left Eye Near:    Bilateral Near:     Physical Exam Constitutional:      Appearance: Normal appearance. She is well-developed and normal weight.  HENT:     Head: Normocephalic.     Right Ear: Ear canal and external ear normal. A middle ear effusion is present.     Left Ear: Ear canal and external ear normal. A middle ear effusion is present.     Nose: Congestion and rhinorrhea present.     Mouth/Throat:     Mouth: Mucous membranes are moist.     Pharynx: Posterior  oropharyngeal erythema present.  Eyes:     Extraocular Movements: Extraocular movements intact.     Conjunctiva/sclera: Conjunctivae normal.     Pupils: Pupils are equal, round, and reactive to light.  Cardiovascular:     Rate and Rhythm: Normal rate and regular rhythm.     Pulses: Normal pulses.     Heart sounds: Normal heart sounds.  Pulmonary:     Effort: Pulmonary effort is normal.     Breath sounds: Normal breath sounds.  Musculoskeletal:     Right shoulder: Tenderness present. No swelling or bony tenderness. Normal range of motion.       Arms:     Cervical back: Normal range of motion and neck supple.  Skin:    General: Skin is warm and dry.  Neurological:     Mental Status: She is alert and oriented to person, place, and time. Mental status is at baseline.  Psychiatric:        Mood and Affect: Mood normal.        Behavior: Behavior normal.     UC Treatments / Results  Labs (all labs ordered are listed, but only abnormal results are displayed) Labs Reviewed  SARS CORONAVIRUS 2 (TAT 6-24 HRS)    EKG   Radiology No results found.  Procedures Procedures (including critical care time)  Medications Ordered in UC Medications - No data to display  Initial Impression / Assessment and Plan / UC Course  I have reviewed the triage vital signs and the nursing notes.  Pertinent labs & imaging results that were available during my care of the patient were reviewed by me and considered in  my medical decision making (see chart for details).  Viral URI with cough Acute pain of right shoulder  Covid test pending Continue use of montelukast Robitussin DM 100-10 5 mL every 4 hours Ibuprofen 600 mg every 6 hours prn Final Clinical Impressions(s) / UC Diagnoses   Final diagnoses:  Viral URI with cough  Acute pain of right shoulder     Discharge Instructions      Covid test pending 24 hours, you will be called if positive   Continue use of montelukast every  evening to help with congestion  Continue use of robitussin DM every 4 hours to help with cough congestion   Can use ibuprofen every 6 hours for fever, chills and shoulder pain    ED Prescriptions     Medication Sig Dispense Auth. Provider   ibuprofen (ADVIL) 600 MG tablet Take 1 tablet (600 mg total) by mouth every 6 (six) hours as needed. 30 tablet Justen Fonda R, NP   guaiFENesin-dextromethorphan (ROBITUSSIN DM) 100-10 MG/5ML syrup Take 5 mLs by mouth every 4 (four) hours as needed for cough. 118 mL Braheem Tomasik, Leitha Schuller, NP      PDMP not reviewed this encounter.   Hans Eden, NP 01/12/21 1433

## 2021-01-13 LAB — SARS CORONAVIRUS 2 (TAT 6-24 HRS): SARS Coronavirus 2: POSITIVE — AB

## 2021-01-16 DIAGNOSIS — U071 COVID-19: Secondary | ICD-10-CM | POA: Diagnosis not present

## 2021-01-16 DIAGNOSIS — K5900 Constipation, unspecified: Secondary | ICD-10-CM | POA: Diagnosis not present

## 2021-01-16 DIAGNOSIS — K921 Melena: Secondary | ICD-10-CM | POA: Diagnosis not present

## 2021-01-16 DIAGNOSIS — L02224 Furuncle of groin: Secondary | ICD-10-CM | POA: Diagnosis not present

## 2021-01-27 DIAGNOSIS — E559 Vitamin D deficiency, unspecified: Secondary | ICD-10-CM | POA: Diagnosis not present

## 2021-01-27 DIAGNOSIS — I10 Essential (primary) hypertension: Secondary | ICD-10-CM | POA: Diagnosis not present

## 2021-01-27 DIAGNOSIS — E782 Mixed hyperlipidemia: Secondary | ICD-10-CM | POA: Diagnosis not present

## 2021-01-27 DIAGNOSIS — Z862 Personal history of diseases of the blood and blood-forming organs and certain disorders involving the immune mechanism: Secondary | ICD-10-CM | POA: Diagnosis not present

## 2021-01-29 ENCOUNTER — Other Ambulatory Visit: Payer: Self-pay | Admitting: Hematology and Oncology

## 2021-02-04 DIAGNOSIS — R739 Hyperglycemia, unspecified: Secondary | ICD-10-CM | POA: Diagnosis not present

## 2021-02-04 DIAGNOSIS — E782 Mixed hyperlipidemia: Secondary | ICD-10-CM | POA: Diagnosis not present

## 2021-02-04 DIAGNOSIS — E559 Vitamin D deficiency, unspecified: Secondary | ICD-10-CM | POA: Diagnosis not present

## 2021-02-04 DIAGNOSIS — I10 Essential (primary) hypertension: Secondary | ICD-10-CM | POA: Diagnosis not present

## 2021-02-23 ENCOUNTER — Other Ambulatory Visit: Payer: Self-pay | Admitting: Hematology and Oncology

## 2021-08-14 DIAGNOSIS — I10 Essential (primary) hypertension: Secondary | ICD-10-CM | POA: Diagnosis not present

## 2021-10-29 DIAGNOSIS — E785 Hyperlipidemia, unspecified: Secondary | ICD-10-CM | POA: Diagnosis not present

## 2021-10-29 DIAGNOSIS — I25118 Atherosclerotic heart disease of native coronary artery with other forms of angina pectoris: Secondary | ICD-10-CM | POA: Diagnosis not present

## 2021-10-29 DIAGNOSIS — I10 Essential (primary) hypertension: Secondary | ICD-10-CM | POA: Diagnosis not present

## 2021-10-29 DIAGNOSIS — I350 Nonrheumatic aortic (valve) stenosis: Secondary | ICD-10-CM | POA: Diagnosis not present

## 2021-11-18 DIAGNOSIS — E785 Hyperlipidemia, unspecified: Secondary | ICD-10-CM | POA: Diagnosis not present

## 2021-11-18 DIAGNOSIS — R002 Palpitations: Secondary | ICD-10-CM | POA: Diagnosis not present

## 2021-11-18 DIAGNOSIS — I2510 Atherosclerotic heart disease of native coronary artery without angina pectoris: Secondary | ICD-10-CM | POA: Diagnosis not present

## 2021-11-18 DIAGNOSIS — I10 Essential (primary) hypertension: Secondary | ICD-10-CM | POA: Diagnosis not present

## 2021-11-18 DIAGNOSIS — I25118 Atherosclerotic heart disease of native coronary artery with other forms of angina pectoris: Secondary | ICD-10-CM | POA: Diagnosis not present

## 2021-11-18 DIAGNOSIS — I350 Nonrheumatic aortic (valve) stenosis: Secondary | ICD-10-CM | POA: Diagnosis not present

## 2021-11-19 DIAGNOSIS — I10 Essential (primary) hypertension: Secondary | ICD-10-CM | POA: Diagnosis not present

## 2021-11-19 DIAGNOSIS — R739 Hyperglycemia, unspecified: Secondary | ICD-10-CM | POA: Diagnosis not present

## 2021-11-19 DIAGNOSIS — E559 Vitamin D deficiency, unspecified: Secondary | ICD-10-CM | POA: Diagnosis not present

## 2021-12-31 ENCOUNTER — Encounter: Payer: Self-pay | Admitting: Podiatry

## 2021-12-31 ENCOUNTER — Ambulatory Visit (INDEPENDENT_AMBULATORY_CARE_PROVIDER_SITE_OTHER): Payer: Medicare HMO

## 2021-12-31 ENCOUNTER — Ambulatory Visit (INDEPENDENT_AMBULATORY_CARE_PROVIDER_SITE_OTHER): Payer: Medicare HMO | Admitting: Podiatry

## 2021-12-31 DIAGNOSIS — M21611 Bunion of right foot: Secondary | ICD-10-CM

## 2021-12-31 DIAGNOSIS — M19071 Primary osteoarthritis, right ankle and foot: Secondary | ICD-10-CM

## 2022-01-03 NOTE — Progress Notes (Signed)
  Subjective:  Patient ID: Carolyn Rowland, female    DOB: March 12, 1941,  MRN: [id number]  Chief Complaint  Patient presents with   Bunions      np bunion on right foot causing pain and burning    81 y.o. female presents with the above complaint. History confirmed with patient.  She has had on and off burning pain for about a year.  The one on the left does not really particularly bother her.  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 Bunion of great toe of right foot   2. Osteoarthritis of first metatarsophalangeal (MTP) joint of right foot      Plan:  Patient was evaluated and treated and all questions answered.  Discussed etiology and treatment options of osteoarthritis in the first metatarsophalangeal joint as well as her bunion deformity.  We discussed surgical and nonsurgical treatment including different shoe gear.  Surgically I recommended a first metatarsophalangeal joint arthrodesis.  She is not ready to pursue surgery at this point would like symptomatic relief.  Discussed corticosteroid injection.  Following sterile prep with Betadine 10 mg of Kenalog and 2 mg of dexamethasone as well as 0.5 cc of lidocaine was injected in the right first metatarsophalangeal joint she tolerated this well return in 3 months for further treatment of her right foot  Return in about 3 months (around 04/02/2022) for follow up on right foot arthritis.

## 2022-01-15 DIAGNOSIS — Z9861 Coronary angioplasty status: Secondary | ICD-10-CM | POA: Diagnosis not present

## 2022-01-15 DIAGNOSIS — K219 Gastro-esophageal reflux disease without esophagitis: Secondary | ICD-10-CM | POA: Diagnosis not present

## 2022-01-15 DIAGNOSIS — M1990 Unspecified osteoarthritis, unspecified site: Secondary | ICD-10-CM | POA: Diagnosis not present

## 2022-01-15 DIAGNOSIS — Z0000 Encounter for general adult medical examination without abnormal findings: Secondary | ICD-10-CM | POA: Diagnosis not present

## 2022-01-15 DIAGNOSIS — E669 Obesity, unspecified: Secondary | ICD-10-CM | POA: Diagnosis not present

## 2022-01-15 DIAGNOSIS — Z0001 Encounter for general adult medical examination with abnormal findings: Secondary | ICD-10-CM | POA: Diagnosis not present

## 2022-01-15 DIAGNOSIS — Z6834 Body mass index (BMI) 34.0-34.9, adult: Secondary | ICD-10-CM | POA: Diagnosis not present

## 2022-01-15 DIAGNOSIS — I10 Essential (primary) hypertension: Secondary | ICD-10-CM | POA: Diagnosis not present

## 2022-02-08 DIAGNOSIS — K219 Gastro-esophageal reflux disease without esophagitis: Secondary | ICD-10-CM | POA: Diagnosis not present

## 2022-02-08 DIAGNOSIS — R739 Hyperglycemia, unspecified: Secondary | ICD-10-CM | POA: Diagnosis not present

## 2022-02-08 DIAGNOSIS — E782 Mixed hyperlipidemia: Secondary | ICD-10-CM | POA: Diagnosis not present

## 2022-02-08 DIAGNOSIS — E559 Vitamin D deficiency, unspecified: Secondary | ICD-10-CM | POA: Diagnosis not present

## 2022-02-08 DIAGNOSIS — I10 Essential (primary) hypertension: Secondary | ICD-10-CM | POA: Diagnosis not present

## 2022-02-08 DIAGNOSIS — J309 Allergic rhinitis, unspecified: Secondary | ICD-10-CM | POA: Diagnosis not present

## 2022-02-18 DIAGNOSIS — I25118 Atherosclerotic heart disease of native coronary artery with other forms of angina pectoris: Secondary | ICD-10-CM | POA: Diagnosis not present

## 2022-02-18 DIAGNOSIS — I10 Essential (primary) hypertension: Secondary | ICD-10-CM | POA: Diagnosis not present

## 2022-02-18 DIAGNOSIS — E785 Hyperlipidemia, unspecified: Secondary | ICD-10-CM | POA: Diagnosis not present

## 2022-02-18 DIAGNOSIS — I350 Nonrheumatic aortic (valve) stenosis: Secondary | ICD-10-CM | POA: Diagnosis not present

## 2022-02-28 ENCOUNTER — Ambulatory Visit (HOSPITAL_COMMUNITY)
Admission: EM | Admit: 2022-02-28 | Discharge: 2022-02-28 | Disposition: A | Discharge status: Home / Self Care | Payer: Medicare HMO | Attending: Family Medicine | Admitting: Family Medicine

## 2022-02-28 ENCOUNTER — Other Ambulatory Visit: Payer: Self-pay

## 2022-02-28 ENCOUNTER — Encounter (HOSPITAL_COMMUNITY): Payer: Self-pay | Admitting: Emergency Medicine

## 2022-02-28 DIAGNOSIS — M79605 Pain in left leg: Secondary | ICD-10-CM

## 2022-02-28 DIAGNOSIS — R002 Palpitations: Secondary | ICD-10-CM | POA: Diagnosis not present

## 2022-02-28 NOTE — ED Provider Notes (Signed)
[place]    CSN: [id number] Arrival date & time: 02/28/22  1602      History   Chief Complaint Chief Complaint  Patient presents with   Leg Pain    HPI Carolyn Rowland is a 81 y.o. female.    Leg Pain  Here with pain in her lower leg on the left.  It began about a week ago.  It is not really been swollen.  She did try an aspirin 1 night and it made a lot of difference in how she is able to sleep but then the pain came right back.  She is on medication for history of hypertension and heart disease.  She states 1 night she forgot all her medicines and then had some extra "jumping" in her heart rhythm.  It did improve when she got back on her medications after missing it just 1 day.  She has still noted this feeling a couple of times since though. No fever or chills.   Past Medical History:  Diagnosis Date   Anemia    in younger years   Anginal pain ([place])    Aortic stenosis    mild to moderate AS 07/2013; moderate AS 08/2019    Arthritis    "hands; legs" (08/17/2013)   CAP (community acquired pneumonia)    Coronary artery disease    6 stents in 2015   GERD (gastroesophageal reflux disease)    H/O hiatal hernia    Heart murmur    High cholesterol    Hx of adenomatous colonic polyps 08/15/2018   Hypertension    Pneumonia 1970's   "once"   Radiation 11/28/14-01/02/15   Right breast 50 Gray   Vertigo     Patient Active Problem List   Diagnosis Date Noted   Lumbar herniated disc 12/16/2020   Foot drop, right 12/16/2020   H/O total hip arthroplasty 12/27/2019   H/O total hip arthroplasty, left 12/26/2019   Spinal stenosis of lumbosacral region 09/22/2017   CAP (community acquired pneumonia) 09/04/2014   Breast cancer of upper-outer quadrant of right female breast ([place]) 08/27/2014   Coronary atherosclerosis of native coronary artery 08/18/2013   S/P PTCA (percutaneous transluminal coronary angioplasty) 08/17/2013    Past Surgical History:  Procedure  Laterality Date   [place] LUMPECTOMY WITH NEEDLE LOCALIZATION AND AXILLARY SENTINEL LYMPH NODE BX Right 10/17/2014   Procedure: BREAST LUMPECTOMY WITH NEEDLE LOCALIZATION AND AXILLARY SENTINEL LYMPH NODE BX;  Surgeon: Jackolyn Confer, MD;  Location: [place];  Service: General;  Laterality: Right;   CARDIAC CATHETERIZATION  08/07/2013   COLONOSCOPY     CORONARY ANGIOPLASTY WITH STENT PLACEMENT  08/17/2013   "6 stents"(08/17/2013)   LEFT AND RIGHT HEART CATHETERIZATION WITH CORONARY ANGIOGRAM N/A 08/07/2013   Procedure: LEFT AND RIGHT HEART CATHETERIZATION WITH CORONARY ANGIOGRAM;  Surgeon: Laverda Page, MD;  Location: [facility] CATH LAB;  Service: Cardiovascular;  Laterality: N/A;   PERCUTANEOUS CORONARY STENT INTERVENTION (PCI-S) N/A 08/17/2013   Procedure: PERCUTANEOUS CORONARY STENT INTERVENTION (PCI-S);  Surgeon: Laverda Page, MD;  Location: [facility] CATH LAB;  Service: Cardiovascular;  Laterality: N/A;   TOTAL HIP ARTHROPLASTY Right 12/19/2017   Procedure: RIGHT TOTAL HIP ARTHROPLASTY ANTERIOR APPROACH;  Surgeon: Marybelle Killings, MD;  Location: [place];  Service: Orthopedics;  Laterality: Right;   TOTAL HIP ARTHROPLASTY Left 12/26/2019   Procedure: TOTAL HIP ARTHROPLASTY-DIRECT ANTERIOR;  Surgeon: Marybelle Killings, MD;  Location: [place];  Service: Orthopedics;  Laterality: Left;   TUBAL LIGATION  1980    OB History   No obstetric history on file.      Home Medications    Prior to Admission medications   Medication Sig Start Date End Date Taking? Authorizing Provider  alendronate (FOSAMAX) 70 MG tablet TAKE 1 TABLET BY MOUTH ONCE A WEEK. TAKE WITH A FULL GLASS OF WATER ON AN EMPTY STOMACH. Patient taking differently: Take 70 mg by mouth every Wednesday. 05/15/19   Nicholas Lose, MD  amLODipine (NORVASC) 2.5 MG tablet Take 2.5 mg by mouth daily.    [provider]  anastrozole (ARIMIDEX) 1 MG tablet TAKE 1 TABLET BY MOUTH EVERY DAY 11/29/20    Nicholas Lose, MD  aspirin 81 MG EC tablet Take 81 mg by mouth daily.     [provider]  Cholecalciferol (VITAMIN D3) 400 units CAPS Take 400 Units by mouth daily.    [provider]  CRESTOR 20 MG tablet Take 20 mg by mouth daily.  Patient not taking: Reported on 02/28/2022 12/30/14   [provider]  HYDROcodone-acetaminophen (NORCO) 5-325 MG tablet Take 1 tablet by mouth every 8 (eight) hours as needed. Patient not taking: Reported on 02/28/2022 12/10/20   Jacqlyn Larsen, PA-C  HYDROcodone-acetaminophen (NORCO/VICODIN) 5-325 MG tablet Take 1-2 tablets by mouth every 6 (six) hours as needed for moderate pain. Patient not taking: Reported on 02/28/2022 12/16/20   Marybelle Killings, MD  ibuprofen (ADVIL) 600 MG tablet Take 1 tablet (600 mg total) by mouth every 6 (six) hours as needed. 01/12/21   Hans Eden, NP  isosorbide mononitrate (IMDUR) 60 MG 24 hr tablet Take 1 tablet (60 mg total) by mouth daily. 08/07/13   Adrian Prows, MD  metoprolol succinate (TOPROL-XL) 50 MG 24 hr tablet Take 50 mg by mouth daily. Take with or immediately following a meal.    [provider]  montelukast (SINGULAIR) 10 MG tablet Take 10 mg by mouth daily. 01/16/20   [provider]  Multiple Minerals-Vitamins (CALCIUM & VIT D3 BONE HEALTH PO) Take 1 tablet by mouth 2 (two) times a week.    [provider]  nitroGLYCERIN (NITROSTAT) 0.4 MG SL tablet Place 1 tablet (0.4 mg total) under the tongue every 5 (five) minutes as needed for chest pain. 08/07/13   Adrian Prows, MD  omeprazole (PRILOSEC) 40 MG capsule Take 40 mg by mouth daily as needed (acid reflux).  11/14/17   [provider]  spironolactone (ALDACTONE) 25 MG tablet Take 25 mg by mouth daily.    [provider]    Family History Family History  Problem Relation Age of Onset   Cancer Brother    Colon cancer Brother    Cancer Brother    Heart attack Brother    Esophageal cancer Neg Hx    Stomach  cancer Neg Hx    Rectal cancer Neg Hx     Social History Social History   Tobacco Use   Smoking status: Former    Packs/day: 1.00    Years: 52.00    Total pack years: 52.00    Types: Cigarettes    Quit date: 04/04/2014    Years since quitting: 7.9   Smokeless tobacco: Never  Vaping Use   Vaping Use: Never used  Substance Use Topics   Alcohol use: No    Comment: 08/17/2013 "quit drinking at age 40; never did drink much"   Drug use: No  Allergies   Penicillins   Review of Systems Review of Systems   Physical Exam Triage Vital Signs ED Triage Vitals  Enc Vitals Group     BP 02/28/22 1657 119/61     Pulse Rate 02/28/22 1657 73     Resp 02/28/22 1657 18     Temp 02/28/22 1657 98.1 F (36.7 C)     Temp Source 02/28/22 1657 Oral     SpO2 02/28/22 1657 95 %     Weight --      Height --      Head Circumference --      Peak Flow --      Pain Score 02/28/22 1654 6     Pain Loc --      Pain Edu? --      Excl. in [place]? --    No data found.  Updated Vital Signs BP 119/61 (BP Location: Left Arm) Comment: has not had medicine Comment (BP Location): large cuff  Pulse 73   Temp 98.1 F (36.7 C) (Oral)   Resp 18   SpO2 95%   Visual Acuity Right Eye Distance:   Left Eye Distance:   Bilateral Distance:    Right Eye Near:   Left Eye Near:    Bilateral Near:     Physical Exam Vitals reviewed.  Constitutional:      General: She is not in acute distress.    Appearance: She is not ill-appearing, toxic-appearing or diaphoretic.  Cardiovascular:     Rate and Rhythm: Normal rate and regular rhythm.     Heart sounds: Murmur (holosystolic, III/III) heard.  Pulmonary:     Effort: Pulmonary effort is normal. No respiratory distress.     Breath sounds: No stridor. No wheezing or rhonchi.  Musculoskeletal:     Comments: There is no edema of either leg.  She does have a smattering of spider veins in the middle of her left lower leg on the anterior lateral side.  It is  very tender there.  There is no erythema.  No Homans' sign.  There is no calf tenderness.   Skin:    Coloration: Skin is not jaundiced or pale.  Neurological:     General: No focal deficit present.     Mental Status: She is alert and oriented to person, place, and time.  Psychiatric:        Behavior: Behavior normal.      UC Treatments / Results  Labs (all labs ordered are listed, but only abnormal results are displayed) Labs Reviewed - No data to display  EKG   Radiology No results found.  Procedures Procedures (including critical care time)  Medications Ordered in UC Medications - No data to display  Initial Impression / Assessment and Plan / UC Course  I have reviewed the triage vital signs and the nursing notes.  Pertinent labs & imaging results that were available during my care of the patient were reviewed by me and considered in my medical decision making (see chart for details).        EKG done for reassurance purposes we will do a venous Doppler which she will get done tomorrow. EKG shows normal sinus rhythm.  She does have a right bundle branch block which is new compared to the last one in epic in 2021.  This was not done for chest pain, however and I have no concern that she is having an acute myocardial infarction.  I am going to have her take  a regular dose aspirin 3 times in the next 24 hours.  Also Ace wrap will be applied to see if that helps her any   Final Clinical Impressions(s) / UC Diagnoses   Final diagnoses:  Left leg pain  Palpitations     Discharge Instructions      The EKG does not show any worrisome rhythm  Take regular dose aspirin 3 times in the next 24 hours.       ED Prescriptions   None    PDMP not reviewed this encounter.   Barrett Henle, MD 02/28/22 1755

## 2022-02-28 NOTE — ED Triage Notes (Addendum)
Pain to lateral left lower leg since Monday.  No known injury

## 2022-02-28 NOTE — Discharge Instructions (Addendum)
The EKG does not show any worrisome rhythm  Take regular dose aspirin 3 times in the next 24 hours.

## 2022-02-28 NOTE — ED Notes (Signed)
Patient no in treatment room

## 2022-03-01 ENCOUNTER — Ambulatory Visit (HOSPITAL_COMMUNITY): Admission: RE | Admit: 2022-03-01 | Payer: Medicare HMO | Source: Ambulatory Visit

## 2022-03-01 ENCOUNTER — Telehealth (HOSPITAL_COMMUNITY): Payer: Self-pay | Admitting: Family Medicine

## 2022-03-01 NOTE — Telephone Encounter (Signed)
Note opened just to document that pt apparently did not show for her venous doppler today

## 2022-03-02 ENCOUNTER — Ambulatory Visit (HOSPITAL_COMMUNITY)
Admission: RE | Admit: 2022-03-02 | Discharge: 2022-03-02 | Disposition: A | Discharge status: Home / Self Care | Payer: Medicare HMO | Source: Ambulatory Visit | Attending: Family Medicine | Admitting: Family Medicine

## 2022-03-02 DIAGNOSIS — M79605 Pain in left leg: Secondary | ICD-10-CM | POA: Diagnosis not present

## 2022-03-02 DIAGNOSIS — R52 Pain, unspecified: Secondary | ICD-10-CM

## 2022-03-02 NOTE — Progress Notes (Signed)
Left lower extremity venous duplex has been completed. Preliminary results can be found in CV Proc through chart review.   03/02/22 10:36 AM Carolyn Rowland RVT

## 2022-03-03 DIAGNOSIS — Z1231 Encounter for screening mammogram for malignant neoplasm of breast: Secondary | ICD-10-CM | POA: Diagnosis not present

## 2022-03-12 ENCOUNTER — Ambulatory Visit: Payer: Medicare HMO | Admitting: Orthopaedic Surgery

## 2022-03-12 ENCOUNTER — Ambulatory Visit (INDEPENDENT_AMBULATORY_CARE_PROVIDER_SITE_OTHER): Payer: Medicare HMO

## 2022-03-12 ENCOUNTER — Encounter: Payer: Self-pay | Admitting: Orthopaedic Surgery

## 2022-03-12 VITALS — BP 123/74 | HR 79 | Ht 65.0 in | Wt 198.0 lb

## 2022-03-12 DIAGNOSIS — M25562 Pain in left knee: Secondary | ICD-10-CM | POA: Diagnosis not present

## 2022-03-12 DIAGNOSIS — M25552 Pain in left hip: Secondary | ICD-10-CM | POA: Diagnosis not present

## 2022-03-14 DIAGNOSIS — M25552 Pain in left hip: Secondary | ICD-10-CM | POA: Diagnosis not present

## 2022-03-14 DIAGNOSIS — M25562 Pain in left knee: Secondary | ICD-10-CM | POA: Diagnosis not present

## 2022-03-14 MED ORDER — BUPIVACAINE HCL 0.5 % IJ SOLN
3.0000 mL | INTRAMUSCULAR | Status: AC | PRN
  Administered 2022-03-14: 3 mL via INTRA_ARTICULAR

## 2022-03-14 MED ORDER — LIDOCAINE HCL 1 % IJ SOLN
0.5000 mL | INTRAMUSCULAR | Status: AC | PRN
  Administered 2022-03-14: .5 mL

## 2022-03-14 MED ORDER — METHYLPREDNISOLONE ACETATE 40 MG/ML IJ SUSP
40.0000 mg | INTRAMUSCULAR | Status: AC | PRN
  Administered 2022-03-14: 40 mg via INTRA_ARTICULAR

## 2022-03-14 NOTE — Progress Notes (Signed)
Office Visit Note   Patient: Carolyn Rowland           Date of Birth: 1940-08-22           MRN: [id number] Visit Date: 03/12/2022              Requested by: Fanny Bien, MD [address] [place],  [place] [address] PCP: Fanny Bien, MD   Assessment & Plan: Visit Diagnoses:  1. Acute pain of left knee   2. Pain in left hip     Plan: Left knee injection performed which she tolerated well.  Hip radiographs show good position and alignment.  She does have severe stenosis with some instability at L4-5.  She can follow-up if she has ongoing symptoms.  Follow-Up Instructions: No follow-ups on file.   Orders:  Orders Placed This Encounter  Procedures   Large Joint Inj   XR HIP UNILAT W OR W/O PELVIS 2-3 VIEWS LEFT   XR Knee 1-2 Views Left   No orders of the defined types were placed in this encounter.     Procedures: Large Joint Inj: L knee on 03/14/2022 5:07 PM Indications: joint swelling and pain Details: 22 G 1.5 in needle, anterolateral approach  Arthrogram: No  Medications: 0.5 mL lidocaine 1 %; 3 mL bupivacaine 0.5 %; 40 mg methylPREDNISolone acetate 40 MG/ML Outcome: tolerated well, no immediate complications Procedure, treatment alternatives, risks and benefits explained, specific risks discussed. Consent was given by the patient. Immediately prior to procedure a time out was called to verify the correct patient, procedure, equipment, support staff and site/side marked as required. Patient was prepped and draped in the usual sterile fashion.       Clinical Data: No additional findings.   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  She  has been on vitamin D3 and states it seems that that is actually helped her back pain.  Pain in her knee has been medial and posterior sometimes radiating into her upper calf.  She did relate she stepped on and heel got bit and not sure if that may have aggravated her knee symptoms.  No associated bowel or bladder symptoms currently.  Review of Systems all other 14 point systems noncontributory to HPI.   Objective: Vital Signs: BP 123/74   Pulse 79   Ht '5\' 5"'$  (1.651 m)   Wt 198 lb (89.8 kg)   BMI 32.95 kg/m   Physical Exam Constitutional:      Appearance: She is well-developed.  HENT:     Head: Normocephalic.     Right Ear: External ear normal.     Left Ear: External ear normal. There is no impacted cerumen.  Eyes:     Pupils: Pupils are equal, round, and reactive to light.  Neck:     Thyroid: No thyromegaly.     Trachea: No tracheal deviation.  Cardiovascular:     Rate and Rhythm: Normal rate.  Pulmonary:     Effort: Pulmonary effort is normal.  Abdominal:     Palpations: Abdomen is soft.  Musculoskeletal:     Cervical back: No rigidity.  Skin:    General:  Skin is warm and dry.  Neurological:     Mental Status: She is alert and oriented to person, place, and time.  Psychiatric:        Behavior: Behavior normal.     Ortho Exam some medial joint line tenderness crepitus with knee range of motion.  Pain with patellofemoral compression and contracture.  Specialty Comments:  No specialty comments available.  Imaging: No results found.   PMFS History: Patient Active Problem List   Diagnosis Date Noted   Lumbar herniated disc 12/16/2020   Foot drop, right 12/16/2020   H/O total hip arthroplasty 12/27/2019   H/O total hip arthroplasty, left 12/26/2019   Spinal stenosis of lumbosacral region 09/22/2017   CAP (community acquired pneumonia) 09/04/2014   Breast cancer of upper-outer quadrant of right female breast ([place]) 08/27/2014   Coronary atherosclerosis of native  coronary artery 08/18/2013   S/P PTCA (percutaneous transluminal coronary angioplasty) 08/17/2013   Past Medical History:  Diagnosis Date   Anemia    in younger years   Anginal pain ([place])    Aortic stenosis    mild to moderate AS 07/2013; moderate AS 08/2019    Arthritis    "hands; legs" (08/17/2013)   CAP (community acquired pneumonia)    Coronary artery disease    6 stents in 2015   GERD (gastroesophageal reflux disease)    H/O hiatal hernia    Heart murmur    High cholesterol    Hx of adenomatous colonic polyps 08/15/2018   Hypertension    Pneumonia 1970's   "once"   Radiation 11/28/14-01/02/15   Right breast 50 Gray   Vertigo     Family History  Problem Relation Age of Onset   Cancer Brother    Colon cancer Brother    Cancer Brother    Heart attack Brother    Esophageal cancer Neg Hx    Stomach cancer Neg Hx    Rectal cancer Neg Hx     Past Surgical History:  Procedure Laterality Date   ABDOMINAL HYSTERECTOMY  1985   APPENDECTOMY  1960   BREAST LUMPECTOMY WITH NEEDLE LOCALIZATION AND AXILLARY SENTINEL LYMPH NODE BX Right 10/17/2014   Procedure: BREAST LUMPECTOMY WITH NEEDLE LOCALIZATION AND AXILLARY SENTINEL LYMPH NODE BX;  Surgeon: Jackolyn Confer, MD;  Location: [place];  Service: General;  Laterality: Right;   CARDIAC CATHETERIZATION  08/07/2013   COLONOSCOPY     CORONARY ANGIOPLASTY WITH STENT PLACEMENT  08/17/2013   "6 stents"(08/17/2013)   LEFT AND RIGHT HEART CATHETERIZATION WITH CORONARY ANGIOGRAM N/A 08/07/2013   Procedure: LEFT AND RIGHT HEART CATHETERIZATION WITH CORONARY ANGIOGRAM;  Surgeon: Laverda Page, MD;  Location: [facility] CATH LAB;  Service: Cardiovascular;  Laterality: N/A;   PERCUTANEOUS CORONARY STENT INTERVENTION (PCI-S) N/A 08/17/2013   Procedure: PERCUTANEOUS CORONARY STENT INTERVENTION (PCI-S);  Surgeon: Laverda Page, MD;  Location: [facility] CATH LAB;  Service: Cardiovascular;  Laterality: N/A;   TOTAL HIP ARTHROPLASTY Right 12/19/2017   Procedure: RIGHT  TOTAL HIP ARTHROPLASTY ANTERIOR APPROACH;  Surgeon: Marybelle Killings, MD;  Location: [place];  Service: Orthopedics;  Laterality: Right;   TOTAL HIP ARTHROPLASTY Left 12/26/2019   Procedure: TOTAL HIP ARTHROPLASTY-DIRECT ANTERIOR;  Surgeon: Marybelle Killings, MD;  Location: [place];  Service: Orthopedics;  Laterality: Left;   TUBAL LIGATION  1980   Social History   Occupational History   Not on file  Tobacco Use   Smoking status: Former    Packs/day: 1.00    Years: 52.00  Total pack years: 52.00    Types: Cigarettes    Quit date: 04/04/2014    Years since quitting: 7.9   Smokeless tobacco: Never  Vaping Use   Vaping Use: Never used  Substance and Sexual Activity   Alcohol use: No    Comment: 08/17/2013 "quit drinking at age 54; never did drink much"   Drug use: No   Sexual activity: Never

## 2022-04-01 ENCOUNTER — Ambulatory Visit: Payer: Medicare HMO | Admitting: Podiatry

## 2022-05-20 DIAGNOSIS — H5203 Hypermetropia, bilateral: Secondary | ICD-10-CM | POA: Diagnosis not present

## 2022-05-20 DIAGNOSIS — I10 Essential (primary) hypertension: Secondary | ICD-10-CM | POA: Diagnosis not present

## 2022-05-20 DIAGNOSIS — H52223 Regular astigmatism, bilateral: Secondary | ICD-10-CM | POA: Diagnosis not present

## 2022-05-20 DIAGNOSIS — H524 Presbyopia: Secondary | ICD-10-CM | POA: Diagnosis not present

## 2022-05-20 DIAGNOSIS — E785 Hyperlipidemia, unspecified: Secondary | ICD-10-CM | POA: Diagnosis not present

## 2022-05-20 DIAGNOSIS — I350 Nonrheumatic aortic (valve) stenosis: Secondary | ICD-10-CM | POA: Diagnosis not present

## 2022-05-20 DIAGNOSIS — I25118 Atherosclerotic heart disease of native coronary artery with other forms of angina pectoris: Secondary | ICD-10-CM | POA: Diagnosis not present

## 2022-05-20 DIAGNOSIS — H2513 Age-related nuclear cataract, bilateral: Secondary | ICD-10-CM | POA: Diagnosis not present

## 2022-07-26 ENCOUNTER — Other Ambulatory Visit: Payer: Self-pay | Admitting: Adult Health

## 2022-07-30 DIAGNOSIS — I700 Atherosclerosis of aorta: Secondary | ICD-10-CM | POA: Diagnosis not present

## 2022-07-30 DIAGNOSIS — E669 Obesity, unspecified: Secondary | ICD-10-CM | POA: Diagnosis not present

## 2022-07-30 DIAGNOSIS — Z7189 Other specified counseling: Secondary | ICD-10-CM | POA: Diagnosis not present

## 2022-07-30 DIAGNOSIS — Z79899 Other long term (current) drug therapy: Secondary | ICD-10-CM | POA: Diagnosis not present

## 2022-07-30 DIAGNOSIS — E559 Vitamin D deficiency, unspecified: Secondary | ICD-10-CM | POA: Diagnosis not present

## 2022-07-30 DIAGNOSIS — I110 Hypertensive heart disease with heart failure: Secondary | ICD-10-CM | POA: Diagnosis not present

## 2022-07-30 DIAGNOSIS — Z0001 Encounter for general adult medical examination with abnormal findings: Secondary | ICD-10-CM | POA: Diagnosis not present

## 2022-07-30 DIAGNOSIS — Z1159 Encounter for screening for other viral diseases: Secondary | ICD-10-CM | POA: Diagnosis not present

## 2022-07-30 DIAGNOSIS — Z23 Encounter for immunization: Secondary | ICD-10-CM | POA: Diagnosis not present

## 2022-12-22 ENCOUNTER — Telehealth: Payer: Self-pay | Admitting: Podiatry

## 2022-12-22 NOTE — Telephone Encounter (Signed)
Pt called and scheduled an appt for her bunion that she stated is "on fire." Pt wants to know what she can do for the pain until her appt next Wednesday.

## 2022-12-29 ENCOUNTER — Ambulatory Visit: Payer: 59 | Admitting: Podiatry

## 2023-06-21 ENCOUNTER — Ambulatory Visit (INDEPENDENT_AMBULATORY_CARE_PROVIDER_SITE_OTHER): Payer: 59 | Admitting: Podiatry

## 2023-06-21 DIAGNOSIS — M19071 Primary osteoarthritis, right ankle and foot: Secondary | ICD-10-CM | POA: Diagnosis not present

## 2023-06-23 NOTE — Progress Notes (Signed)
  Subjective:  Patient ID: Carolyn Rowland, female    DOB: [birth year],  MRN: [id number]  Chief Complaint  Patient presents with   Foot Pain    Right foot, she has noticed a spot  on her foot, she reports that you had gave her an injection in the foot in the past and it gave her a lot of relief.     82 y.o. female presents with the above complaint. History confirmed with patient.  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 Osteoarthritis of first metatarsophalangeal (MTP) joint of right foot      Plan:  Patient was evaluated and treated and all questions answered.  Doing well her injection at last visit helped quite a bit in reducing her symptoms but is worn off.  She requested further injection relief today this will post to avoid surgical treatment at this point.  The joint was prepped with Betadine and 10 mg of Kenalog and 2 mg of dexamethasone with 1 cc of 0.5% Marcaine plain were injected into the first metatarsal phalangeal joint.  She tolerated this well.  She will follow-up me as needed when further injection therapy is necessary.  Return if symptoms worsen or fail to improve.

## 2023-08-05 ENCOUNTER — Other Ambulatory Visit (HOSPITAL_COMMUNITY): Payer: Self-pay | Admitting: Cardiology

## 2023-08-05 DIAGNOSIS — I350 Nonrheumatic aortic (valve) stenosis: Secondary | ICD-10-CM

## 2023-08-12 ENCOUNTER — Ambulatory Visit (HOSPITAL_COMMUNITY)
Admission: RE | Admit: 2023-08-12 | Discharge: 2023-08-12 | Disposition: A | Discharge status: Home / Self Care | Payer: Medicare HMO | Source: Ambulatory Visit | Attending: *Deleted | Admitting: *Deleted

## 2023-08-12 DIAGNOSIS — I350 Nonrheumatic aortic (valve) stenosis: Secondary | ICD-10-CM | POA: Diagnosis present

## 2023-08-12 DIAGNOSIS — I2510 Atherosclerotic heart disease of native coronary artery without angina pectoris: Secondary | ICD-10-CM | POA: Diagnosis not present

## 2023-08-12 LAB — ECHOCARDIOGRAM COMPLETE
AR max vel: 0.75 cm2
AV Area VTI: 0.7 cm2
AV Area mean vel: 0.82 cm2
AV Mean grad: 27.5 mm[Hg]
AV Peak grad: 53 mm[Hg]
Ao pk vel: 3.64 m/s
Area-P 1/2: 2.61 cm2
MV VTI: 2.2 cm2
S' Lateral: 2.3 cm

## 2023-08-12 NOTE — Progress Notes (Signed)
*  PRELIMINARY RESULTS* Echocardiogram 2D Echocardiogram has been performed.  Carolyn Rowland Amzie Sillas 08/12/2023, 9:51 AM

## 2023-08-18 NOTE — H&P (View-Only) (Signed)
 Patient ID: Carolyn Rowland MRN: [id number] DOB/AGE: 1941/03/05 83 y.o.  Primary Care Physician:Brown, Burman Nieves, NP Primary Cardiologist: Sharyn Lull  CC:  Aortic valvular disease management     FOCUSED PROBLEM LIST:   Aortic stenosis AVA 0.68, mean gradient 29, V-max 3.75; SVI 28 cc/m EF 60 to 65% TTE February 2025 Right bundle branch block Diastolic dysfunction G1 DD, TTE February 2025 CAD PCI of mid to distal RCA and proximal to distal circumflex 2015 Hypertension Hyperlipidemia Aortic atherosclerosis Chest x-ray 2018 Right breast cancer Status post lumpectomy, radiation, and antiestrogen therapy CKD stage IIIa BMI 38/BSA 2.1  Discussed the use of AI scribe software for clinical note transcription with the patient, who gave verbal consent to proceed.  History of Present Illness       2/25: The patient is a 83 year old female with the above listed medical problems referred for recommendations regarding her aortic valvular disease.  She was seen by Dr. Sharyn Lull recently with complaints of increasing fatigue.  An echocardiogram demonstrated moderate to severe aortic stenosis with a preserved LV function.  She has been experiencing shortness of breath and chest discomfort for about a month. The shortness of breath occurs with exertion, such as walking, singing, or bending over. She also experiences episodes of lightheadedness, particularly when bending over, requiring her to hold onto the sink for support. She describes a 'funny feeling' when eating or drinking certain things, first noticed about a month ago while eating Jell-O.  In addition to shortness of breath, she experiences chest discomfort and indigestion-like symptoms, described as 'horrible.' Omeprazole provides some relief. These symptoms occur both at rest and with exertion. She denies swelling in her legs or difficulty breathing while lying flat. She also experiences palpitations, describing them as her heart  'fluttering.'  She has a history of coronary artery disease, with stents placed approximately ten years ago following a heart attack. She is on medication for high cholesterol and high blood pressure, with well-controlled blood pressure. No history of stroke or diabetes.  She underwent dental work in December, including a filling and a crown, after which she experienced swelling of her lips and jaw, and felt 'crazy' for a couple of days due to the medication given by the dentist. No recent bleeding, fevers, or chills.         Past Medical History:  Diagnosis Date   Anemia    in younger years   Anginal pain (HCC)    Aortic stenosis    mild to moderate AS 07/2013; moderate AS 08/2019    Arthritis    "hands; legs" (08/17/2013)   CAP (community acquired pneumonia)    Coronary artery disease    6 stents in 2015   GERD (gastroesophageal reflux disease)    H/O hiatal hernia    Heart murmur    High cholesterol    Hx of adenomatous colonic polyps 08/15/2018   Hypertension    Pneumonia 1970's   "once"   Radiation 11/28/14-01/02/15   Right breast 50 Gray   Vertigo     Past Surgical History:  Procedure Laterality Date   ABDOMINAL HYSTERECTOMY  1985   APPENDECTOMY  1960   BREAST LUMPECTOMY WITH NEEDLE LOCALIZATION AND AXILLARY SENTINEL LYMPH NODE BX Right 10/17/2014   Procedure: BREAST LUMPECTOMY WITH NEEDLE LOCALIZATION AND AXILLARY SENTINEL LYMPH NODE BX;  Surgeon: Avel Peace, MD;  Location: [facility] OR;  Service: General;  Laterality: Right;   CARDIAC CATHETERIZATION  08/07/2013   COLONOSCOPY     CORONARY ANGIOPLASTY  WITH STENT PLACEMENT  08/17/2013   "6 stents"(08/17/2013)   LEFT AND RIGHT HEART CATHETERIZATION WITH CORONARY ANGIOGRAM N/A 08/07/2013   Procedure: LEFT AND RIGHT HEART CATHETERIZATION WITH CORONARY ANGIOGRAM;  Surgeon: Pamella Pert, MD;  Location: [facility] CATH LAB;  Service: Cardiovascular;  Laterality: N/A;   PERCUTANEOUS CORONARY STENT INTERVENTION (PCI-S) N/A 08/17/2013    Procedure: PERCUTANEOUS CORONARY STENT INTERVENTION (PCI-S);  Surgeon: Pamella Pert, MD;  Location: [facility] CATH LAB;  Service: Cardiovascular;  Laterality: N/A;   TOTAL HIP ARTHROPLASTY Right 12/19/2017   Procedure: RIGHT TOTAL HIP ARTHROPLASTY ANTERIOR APPROACH;  Surgeon: Eldred Manges, MD;  Location: [facility] OR;  Service: Orthopedics;  Laterality: Right;   TOTAL HIP ARTHROPLASTY Left 12/26/2019   Procedure: TOTAL HIP ARTHROPLASTY-DIRECT ANTERIOR;  Surgeon: Eldred Manges, MD;  Location: [facility] OR;  Service: Orthopedics;  Laterality: Left;   TUBAL LIGATION  1980    Family History  Problem Relation Age of Onset   Cancer Brother    Colon cancer Brother    Cancer Brother    Heart attack Brother    Esophageal cancer Neg Hx    Stomach cancer Neg Hx    Rectal cancer Neg Hx     Social History   Socioeconomic History   Marital status: Married    Spouse name: Not on file   Number of children: Not on file   Years of education: Not on file   Highest education level: Not on file  Occupational History   Not on file  Tobacco Use   Smoking status: Former    Current packs/day: 0.00    Average packs/day: 1 pack/day for 52.0 years (52.0 ttl pk-yrs)    Types: Cigarettes    Start date: 04/04/1962    Quit date: 04/04/2014    Years since quitting: 9.3   Smokeless tobacco: Never  Vaping Use   Vaping status: Never Used  Substance and Sexual Activity   Alcohol use: No    Comment: 08/17/2013 "quit drinking at age 18; never did drink much"   Drug use: No   Sexual activity: Never  Other Topics Concern   Not on file  Social History Narrative   Not on file   Social Drivers of Health   Financial Resource Strain: Not on file  Food Insecurity: No Food Insecurity (01/23/2020)   Hunger Vital Sign    Worried About Running Out of Food in the Last Year: Never true    Ran Out of Food in the Last Year: Never true  Transportation Needs: No Transportation Needs (01/23/2020)   PRAPARE - Scientist, research (physical sciences) (Medical): No    Lack of Transportation (Non-Medical): No  Physical Activity: Not on file  Stress: Not on file  Social Connections: Not on file  Intimate Partner Violence: Not on file     Prior to Admission medications   Medication Sig Start Date End Date Taking? Authorizing Provider  alendronate (FOSAMAX) 70 MG tablet TAKE 1 TABLET BY MOUTH ONCE A WEEK. TAKE WITH A FULL GLASS OF WATER ON AN EMPTY STOMACH. Patient taking differently: Take 70 mg by mouth every Wednesday. 05/15/19   Serena Croissant, MD  amLODipine (NORVASC) 2.5 MG tablet Take 2.5 mg by mouth daily.    [provider]  anastrozole (ARIMIDEX) 1 MG tablet TAKE 1 TABLET BY MOUTH EVERY DAY 11/29/20   Serena Croissant, MD  aspirin 81 MG EC tablet Take 81 mg by mouth daily.     [provider]  Cholecalciferol (VITAMIN D3) 400 units CAPS Take 400 Units by mouth daily.    [provider]  CRESTOR 20 MG tablet Take 20 mg by mouth daily. 12/30/14   [provider]  HYDROcodone-acetaminophen (NORCO) 5-325 MG tablet Take 1 tablet by mouth every 8 (eight) hours as needed. 12/10/20   Rosezella Rumpf, PA-C  HYDROcodone-acetaminophen (NORCO/VICODIN) 5-325 MG tablet Take 1-2 tablets by mouth every 6 (six) hours as needed for moderate pain. 12/16/20   Eldred Manges, MD  ibuprofen (ADVIL) 600 MG tablet Take 1 tablet (600 mg total) by mouth every 6 (six) hours as needed. 01/12/21   Valinda Hoar, NP  isosorbide mononitrate (IMDUR) 60 MG 24 hr tablet Take 1 tablet (60 mg total) by mouth daily. 08/07/13   Yates Decamp, MD  metoprolol succinate (TOPROL-XL) 50 MG 24 hr tablet Take 50 mg by mouth daily. Take with or immediately following a meal.    [provider]  montelukast (SINGULAIR) 10 MG tablet Take 10 mg by mouth daily. 01/16/20   [provider]  Multiple Minerals-Vitamins (CALCIUM & VIT D3 BONE HEALTH PO) Take 1 tablet by mouth 2 (two) times a week.    [provider]   nitroGLYCERIN (NITROSTAT) 0.4 MG SL tablet Place 1 tablet (0.4 mg total) under the tongue every 5 (five) minutes as needed for chest pain. 08/07/13   Yates Decamp, MD  omeprazole (PRILOSEC) 40 MG capsule Take 40 mg by mouth daily as needed (acid reflux).  11/14/17   [provider]  spironolactone (ALDACTONE) 25 MG tablet Take 25 mg by mouth daily.    [provider]    Allergies  Allergen Reactions   Penicillins Rash    Has patient had a PCN reaction causing immediate rash, facial/tongue/throat swelling, SOB or lightheadedness with hypotension: No Has patient had a PCN reaction causing severe rash involving mucus membranes or skin necrosis: Unknown Has patient had a PCN reaction that required hospitalization: No Has patient had a PCN reaction occurring within the last 10 years: No If all of the above answers are "NO", then may proceed with Cephalosporin use.     REVIEW OF SYSTEMS:  General: no fevers/chills/night sweats Eyes: no blurry vision, diplopia, or amaurosis ENT: no sore throat or hearing loss Resp: no cough, wheezing, or hemoptysis CV: no edema or palpitations GI: no abdominal pain, nausea, vomiting, diarrhea, or constipation GU: no dysuria, frequency, or hematuria Skin: no rash Neuro: no headache, numbness, tingling, or weakness of extremities Musculoskeletal: no joint pain or swelling Heme: no bleeding, DVT, or easy bruising Endo: no polydipsia or polyuria  BP 100/60   Pulse 74   Ht 5\' 5"  (1.651 m)   Wt 202 lb 6.4 oz (91.8 kg)   SpO2 98%   BMI 33.68 kg/m   PHYSICAL EXAM: GEN:  AO x 3 in no acute distress HEENT: normal Dentition: Normal Neck: JVP normal. +2 carotid upstrokes without bruits. No thyromegaly. Lungs: equal expansion, clear bilaterally CV: Apex is discrete and nondisplaced, RRR with 3/6 SEM Abd: soft, non-tender, non-distended; no bruit; positive bowel sounds Ext: no edema, ecchymoses, or cyanosis Vascular: 2+ femoral pulses, 2+  radial pulses       Skin: warm and dry without rash Neuro: CN II-XII grossly intact; motor and sensory grossly intact    DATA AND STUDIES:  EKG: EKG performed August 2023 that I personally reviewed demonstrates sinus rhythm with a right bundle branch block  EKG Interpretation Date/Time:  Monday August 22 2023 08:09:21 EST Ventricular Rate:  74 PR Interval:  174 QRS Duration:  134 QT Interval:  422 QTC Calculation: 468 R Axis:   -16  Text Interpretation: Normal sinus rhythm Right bundle branch block Minimal voltage criteria for LVH, may be normal variant ( R in aVL ) Inferior infarct , age undetermined When compared with ECG of 28-Feb-2022 17:45, Inferior infarct is now Present Confirmed by Alverda Skeans ([id number]) on 08/22/2023 8:12:20 AM        Cardiac Studies & Procedures   ______________________________________________________________________________________________   STRESS TESTS  NM MYOCAR MULTI W/SPECT W 10/24/2017  Narrative CLINICAL DATA:  Chest pain, preop evaluation  EXAM: MYOCARDIAL IMAGING WITH SPECT (REST AND PHARMACOLOGIC-STRESS)  GATED LEFT VENTRICULAR WALL MOTION STUDY  LEFT VENTRICULAR EJECTION FRACTION  TECHNIQUE: Standard myocardial SPECT imaging was performed after resting intravenous injection of 10 mCi Tc-64m tetrofosmin. Subsequently, intravenous infusion of Lexiscan was performed under the supervision of the Cardiology staff. At peak effect of the drug, 30 mCi Tc-29m tetrofosmin was injected intravenously and standard myocardial SPECT imaging was performed. Quantitative gated imaging was also performed to evaluate left ventricular wall motion, and estimate left ventricular ejection fraction.  COMPARISON:  None.  FINDINGS: Perfusion: No decreased activity in the left ventricle on stress imaging to suggest reversible ischemia or infarction.  Wall Motion: Normal left ventricular wall motion. No left ventricular dilation.  Left Ventricular  Ejection Fraction: 72 %  End diastolic volume 61 ml  End systolic volume 17 ml  IMPRESSION: 1. No reversible ischemia or infarction.  2. Normal left ventricular wall motion.  3. Left ventricular ejection fraction 72%  4. Non invasive risk stratification*: Low  *2012 Appropriate Use Criteria for Coronary Revascularization Focused Update: J Am Coll Cardiol. 2012;59(9):857-881. http://content.dementiazones.com.aspx?articleid=1201161   Electronically Signed By: Judie Petit.  Shick M.D. On: 10/24/2017 15:37   ECHOCARDIOGRAM  ECHOCARDIOGRAM COMPLETE 08/12/2023  Narrative ECHOCARDIOGRAM REPORT    Patient Name:   Carolyn Rowland Date of Exam: 08/12/2023 Medical Rec #:  [id number]     Height:       65.0 in Accession #:    [id number]    Weight:       198.0 lb Date of Birth:  October 22, 1940    BSA:          1.970 m Patient Age:    82 years      BP:           123/74 mmHg Patient Gender: F             HR:           66 bpm. Exam Location:  Outpatient  Procedure: 2D Echo, Cardiac Doppler, Color Doppler and Strain Analysis  Indications:    Aortic valve stenosis, moderate [I35.0]  History:        Patient has prior history of Echocardiogram examinations. CAD.  Sonographer:    Neysa Bonito Roar Referring Phys: [phone] [facility]   Sonographer Comments: Global longitudinal strain was attempted. IMPRESSIONS   1. Left ventricular ejection fraction, by estimation, is 60 to 65%. The left ventricle has normal function. The left ventricle has no regional wall motion abnormalities. There is severe left ventricular hypertrophy. Left ventricular diastolic parameters are consistent with Grade I diastolic dysfunction (impaired relaxation). Elevated left ventricular end-diastolic pressure. The average left ventricular global longitudinal strain is -20.4 %. The global longitudinal strain is normal. 2. Right ventricular systolic function is normal. The right ventricular size is normal. 3. Left atrial size was  moderately dilated.  4. The mitral valve is abnormal. Trivial mitral valve regurgitation. No evidence of mitral stenosis. 5. AS moderate by gradients and DVI but severe based on AVA 0.7cm2 using VTI. Valve is severely calcified . The aortic valve is tricuspid. There is severe calcifcation of the aortic valve. There is severe thickening of the aortic valve. Aortic valve regurgitation is trivial. Moderate to severe aortic valve stenosis. 6. The inferior vena cava is normal in size with greater than 50% respiratory variability, suggesting right atrial pressure of 3 mmHg.  FINDINGS Left Ventricle: Left ventricular ejection fraction, by estimation, is 60 to 65%. The left ventricle has normal function. The left ventricle has no regional wall motion abnormalities. The average left ventricular global longitudinal strain is -20.4 %. The global longitudinal strain is normal. The left ventricular internal cavity size was normal in size. There is severe left ventricular hypertrophy. Left ventricular diastolic parameters are consistent with Grade I diastolic dysfunction (impaired relaxation). Elevated left ventricular end-diastolic pressure.  Right Ventricle: The right ventricular size is normal. No increase in right ventricular wall thickness. Right ventricular systolic function is normal.  Left Atrium: Left atrial size was moderately dilated.  Right Atrium: Right atrial size was normal in size.  Pericardium: There is no evidence of pericardial effusion.  Mitral Valve: The mitral valve is abnormal. There is mild thickening of the mitral valve leaflet(s). Mild mitral annular calcification. Trivial mitral valve regurgitation. No evidence of mitral valve stenosis. MV peak gradient, 6.2 mmHg. The mean mitral valve gradient is 2.0 mmHg.  Tricuspid Valve: The tricuspid valve is normal in structure. Tricuspid valve regurgitation is mild . No evidence of tricuspid stenosis.  Aortic Valve: AS moderate by gradients  and DVI but severe based on AVA 0.7cm2 using VTI. Valve is severely calcified. The aortic valve is tricuspid. There is severe calcifcation of the aortic valve. There is severe thickening of the aortic valve. Aortic valve regurgitation is trivial. Moderate to severe aortic stenosis is present. Aortic valve mean gradient measures 27.5 mmHg. Aortic valve peak gradient measures 53.0 mmHg. Aortic valve area, by VTI measures 0.70 cm.  Pulmonic Valve: The pulmonic valve was normal in structure. Pulmonic valve regurgitation is not visualized. No evidence of pulmonic stenosis.  Aorta: The aortic root is normal in size and structure.  Venous: The inferior vena cava is normal in size with greater than 50% respiratory variability, suggesting right atrial pressure of 3 mmHg.  IAS/Shunts: No atrial level shunt detected by color flow Doppler.   LEFT VENTRICLE PLAX 2D LVIDd:         3.60 cm   Diastology LVIDs:         2.30 cm   LV e' medial:    4.68 cm/s LV PW:         1.30 cm   LV E/e' medial:  15.4 LV IVS:        1.60 cm   LV e' lateral:   4.57 cm/s LVOT diam:     1.70 cm   LV E/e' lateral: 15.8 LV SV:         62 LV SV Index:   31        2D Longitudinal Strain LVOT Area:     2.27 cm  2D Strain GLS Avg:     -20.4 %   RIGHT VENTRICLE RV Basal diam:  3.30 cm RV Mid diam:    2.70 cm RV S prime:     9.36 cm/s TAPSE (M-mode): 1.9 cm  LEFT ATRIUM  Index        RIGHT ATRIUM           Index LA diam:        4.30 cm 2.18 cm/m   RA Area:     12.00 cm LA Vol (A2C):   67.9 ml 34.47 ml/m  RA Volume:   24.90 ml  12.64 ml/m LA Vol (A4C):   64.8 ml 32.90 ml/m LA Biplane Vol: 68.8 ml 34.93 ml/m AORTIC VALVE                     PULMONIC VALVE AV Area (Vmax):    0.75 cm      PV Vmax:        0.81 m/s AV Area (Vmean):   0.82 cm      PV Peak grad:   2.6 mmHg AV Area (VTI):     0.70 cm      RVOT Peak grad: 1 mmHg AV Vmax:           364.00 cm/s AV Vmean:          244.500 cm/s AV VTI:             0.883 m AV Peak Grad:      53.0 mmHg AV Mean Grad:      27.5 mmHg LVOT Vmax:         120.00 cm/s LVOT Vmean:        87.900 cm/s LVOT VTI:          0.271 m LVOT/AV VTI ratio: 0.31  AORTA Ao Sinus diam: 3.00 cm Ao STJ diam:   2.6 cm Ao Asc diam:   3.30 cm  MITRAL VALVE                TRICUSPID VALVE MV Area (PHT): 2.61 cm     TR Peak grad:   25.4 mmHg MV Area VTI:   2.20 cm     TR Vmax:        252.00 cm/s MV Peak grad:  6.2 mmHg MV Mean grad:  2.0 mmHg     SHUNTS MV Vmax:       1.24 m/s     Systemic VTI:  0.27 m MV Vmean:      62.6 cm/s    Systemic Diam: 1.70 cm MV Decel Time: 291 msec MV E velocity: 72.30 cm/s MV A velocity: 106.00 cm/s MV E/A ratio:  0.68  Charlton Haws MD Electronically signed by Charlton Haws MD Signature Date/Time: 08/12/2023/10:35:52 AM    Final          ______________________________________________________________________________________________      No results found for requested labs within last 365 days.   STS RISK CALCULATOR: Pending  NHYA CLASS: 2    ASSESSMENT AND PLAN:   1. Nonrheumatic aortic valve stenosis   2. Right bundle branch block   3. Diastolic dysfunction   4. Coronary artery disease involving native coronary artery of native heart without angina pectoris   5. Hyperlipidemia LDL goal <70   6. Primary hypertension   7. Aortic atherosclerosis (HCC)   8. Malignant neoplasm of upper-outer quadrant of right breast in female, estrogen receptor positive (HCC)   9. Stage 3a chronic kidney disease (HCC)   10. BMI 38.0-38.9,adult   11. Pre-procedure lab exam     Aortic stenosis: The patient has developed symptoms attributable to her paradoxical low-flow low gradient aortic stenosis.  Will refer her for coronary angiography study, right heart catheterization, TAVR  protocol CTA, and cardiothoracic surgical opinion.  Further recommendations to be informed by review of this collection of testing. Right bundle branch block: At  higher risk for needing a pacemaker after an aortic valve intervention.  CTA will be able to assess risk with MS length. Diastolic dysfunction: Continue Toprol 50 mg daily and spironolactone 12.5 mg daily.  Patient looks euvolemic today. CAD: Continue aspirin 81 mg, as needed nitroglycerin, Imdur 60 mg, Toprol 50 mg, and Crestor 20 mg. Hyperlipidemia: Continue Crestor 20 mg. Hypertension: Continue spironolactone 25 mg, Toprol 50 mg, and amlodipine 2.5 mg.  BP is well-controlled. Aortic atherosclerosis: Continue aspirin 81 mg and Crestor 20 mg. History of breast cancer: Continue to monitor. 3A chronic kidney disease: Consider ARB for renal protection after aortic stenosis has been managed. Elevated BMI: Diet and exercise interventions can be pursued once aortic stenosis has been managed.   I have personally reviewed the patients imaging data as summarized above.  I have reviewed the natural history of aortic stenosis with the patient and family members who are present today. We have discussed the limitations of medical therapy and the poor prognosis associated with symptomatic aortic stenosis. We have also reviewed potential treatment options, including palliative medical therapy, conventional surgical aortic valve replacement, and transcatheter aortic valve replacement. We discussed treatment options in the context of this patient's specific comorbid medical conditions.   All of the patient's questions were answered today. Will make further recommendations based on the results of studies outlined above.      Orbie Pyo, MD  08/22/2023 9:19 AM    [facility] [address] [place], [place], [place]  [address] Phone: [phone]; Fax: [phone]

## 2023-08-18 NOTE — Progress Notes (Signed)
 Patient ID: Carolyn Rowland MRN: [id number] DOB/AGE: 1941/03/05 83 y.o.  Primary Care Physician:Brown, Burman Nieves, NP Primary Cardiologist: Sharyn Lull  CC:  Aortic valvular disease management     FOCUSED PROBLEM LIST:   Aortic stenosis AVA 0.68, mean gradient 29, V-max 3.75; SVI 28 cc/m EF 60 to 65% TTE February 2025 Right bundle branch block Diastolic dysfunction G1 DD, TTE February 2025 CAD PCI of mid to distal RCA and proximal to distal circumflex 2015 Hypertension Hyperlipidemia Aortic atherosclerosis Chest x-ray 2018 Right breast cancer Status post lumpectomy, radiation, and antiestrogen therapy CKD stage IIIa BMI 38/BSA 2.1  Discussed the use of AI scribe software for clinical note transcription with the patient, who gave verbal consent to proceed.  History of Present Illness       2/25: The patient is a 83 year old female with the above listed medical problems referred for recommendations regarding her aortic valvular disease.  She was seen by Dr. Sharyn Lull recently with complaints of increasing fatigue.  An echocardiogram demonstrated moderate to severe aortic stenosis with a preserved LV function.  She has been experiencing shortness of breath and chest discomfort for about a month. The shortness of breath occurs with exertion, such as walking, singing, or bending over. She also experiences episodes of lightheadedness, particularly when bending over, requiring her to hold onto the sink for support. She describes a 'funny feeling' when eating or drinking certain things, first noticed about a month ago while eating Jell-O.  In addition to shortness of breath, she experiences chest discomfort and indigestion-like symptoms, described as 'horrible.' Omeprazole provides some relief. These symptoms occur both at rest and with exertion. She denies swelling in her legs or difficulty breathing while lying flat. She also experiences palpitations, describing them as her heart  'fluttering.'  She has a history of coronary artery disease, with stents placed approximately ten years ago following a heart attack. She is on medication for high cholesterol and high blood pressure, with well-controlled blood pressure. No history of stroke or diabetes.  She underwent dental work in December, including a filling and a crown, after which she experienced swelling of her lips and jaw, and felt 'crazy' for a couple of days due to the medication given by the dentist. No recent bleeding, fevers, or chills.         Past Medical History:  Diagnosis Date   Anemia    in younger years   Anginal pain (HCC)    Aortic stenosis    mild to moderate AS 07/2013; moderate AS 08/2019    Arthritis    "hands; legs" (08/17/2013)   CAP (community acquired pneumonia)    Coronary artery disease    6 stents in 2015   GERD (gastroesophageal reflux disease)    H/O hiatal hernia    Heart murmur    High cholesterol    Hx of adenomatous colonic polyps 08/15/2018   Hypertension    Pneumonia 1970's   "once"   Radiation 11/28/14-01/02/15   Right breast 50 Gray   Vertigo     Past Surgical History:  Procedure Laterality Date   ABDOMINAL HYSTERECTOMY  1985   APPENDECTOMY  1960   BREAST LUMPECTOMY WITH NEEDLE LOCALIZATION AND AXILLARY SENTINEL LYMPH NODE BX Right 10/17/2014   Procedure: BREAST LUMPECTOMY WITH NEEDLE LOCALIZATION AND AXILLARY SENTINEL LYMPH NODE BX;  Surgeon: Avel Peace, MD;  Location: [facility] OR;  Service: General;  Laterality: Right;   CARDIAC CATHETERIZATION  08/07/2013   COLONOSCOPY     CORONARY ANGIOPLASTY  WITH STENT PLACEMENT  08/17/2013   "6 stents"(08/17/2013)   LEFT AND RIGHT HEART CATHETERIZATION WITH CORONARY ANGIOGRAM N/A 08/07/2013   Procedure: LEFT AND RIGHT HEART CATHETERIZATION WITH CORONARY ANGIOGRAM;  Surgeon: Pamella Pert, MD;  Location: [facility] CATH LAB;  Service: Cardiovascular;  Laterality: N/A;   PERCUTANEOUS CORONARY STENT INTERVENTION (PCI-S) N/A 08/17/2013    Procedure: PERCUTANEOUS CORONARY STENT INTERVENTION (PCI-S);  Surgeon: Pamella Pert, MD;  Location: [facility] CATH LAB;  Service: Cardiovascular;  Laterality: N/A;   TOTAL HIP ARTHROPLASTY Right 12/19/2017   Procedure: RIGHT TOTAL HIP ARTHROPLASTY ANTERIOR APPROACH;  Surgeon: Eldred Manges, MD;  Location: [facility] OR;  Service: Orthopedics;  Laterality: Right;   TOTAL HIP ARTHROPLASTY Left 12/26/2019   Procedure: TOTAL HIP ARTHROPLASTY-DIRECT ANTERIOR;  Surgeon: Eldred Manges, MD;  Location: [facility] OR;  Service: Orthopedics;  Laterality: Left;   TUBAL LIGATION  1980    Family History  Problem Relation Age of Onset   Cancer Brother    Colon cancer Brother    Cancer Brother    Heart attack Brother    Esophageal cancer Neg Hx    Stomach cancer Neg Hx    Rectal cancer Neg Hx     Social History   Socioeconomic History   Marital status: Married    Spouse name: Not on file   Number of children: Not on file   Years of education: Not on file   Highest education level: Not on file  Occupational History   Not on file  Tobacco Use   Smoking status: Former    Current packs/day: 0.00    Average packs/day: 1 pack/day for 52.0 years (52.0 ttl pk-yrs)    Types: Cigarettes    Start date: 04/04/1962    Quit date: 04/04/2014    Years since quitting: 9.3   Smokeless tobacco: Never  Vaping Use   Vaping status: Never Used  Substance and Sexual Activity   Alcohol use: No    Comment: 08/17/2013 "quit drinking at age 18; never did drink much"   Drug use: No   Sexual activity: Never  Other Topics Concern   Not on file  Social History Narrative   Not on file   Social Drivers of Health   Financial Resource Strain: Not on file  Food Insecurity: No Food Insecurity (01/23/2020)   Hunger Vital Sign    Worried About Running Out of Food in the Last Year: Never true    Ran Out of Food in the Last Year: Never true  Transportation Needs: No Transportation Needs (01/23/2020)   PRAPARE - Scientist, research (physical sciences) (Medical): No    Lack of Transportation (Non-Medical): No  Physical Activity: Not on file  Stress: Not on file  Social Connections: Not on file  Intimate Partner Violence: Not on file     Prior to Admission medications   Medication Sig Start Date End Date Taking? Authorizing Provider  alendronate (FOSAMAX) 70 MG tablet TAKE 1 TABLET BY MOUTH ONCE A WEEK. TAKE WITH A FULL GLASS OF WATER ON AN EMPTY STOMACH. Patient taking differently: Take 70 mg by mouth every Wednesday. 05/15/19   Serena Croissant, MD  amLODipine (NORVASC) 2.5 MG tablet Take 2.5 mg by mouth daily.    [provider]  anastrozole (ARIMIDEX) 1 MG tablet TAKE 1 TABLET BY MOUTH EVERY DAY 11/29/20   Serena Croissant, MD  aspirin 81 MG EC tablet Take 81 mg by mouth daily.     [provider]  Cholecalciferol (VITAMIN D3) 400 units CAPS Take 400 Units by mouth daily.    [provider]  CRESTOR 20 MG tablet Take 20 mg by mouth daily. 12/30/14   [provider]  HYDROcodone-acetaminophen (NORCO) 5-325 MG tablet Take 1 tablet by mouth every 8 (eight) hours as needed. 12/10/20   Rosezella Rumpf, PA-C  HYDROcodone-acetaminophen (NORCO/VICODIN) 5-325 MG tablet Take 1-2 tablets by mouth every 6 (six) hours as needed for moderate pain. 12/16/20   Eldred Manges, MD  ibuprofen (ADVIL) 600 MG tablet Take 1 tablet (600 mg total) by mouth every 6 (six) hours as needed. 01/12/21   Valinda Hoar, NP  isosorbide mononitrate (IMDUR) 60 MG 24 hr tablet Take 1 tablet (60 mg total) by mouth daily. 08/07/13   Yates Decamp, MD  metoprolol succinate (TOPROL-XL) 50 MG 24 hr tablet Take 50 mg by mouth daily. Take with or immediately following a meal.    [provider]  montelukast (SINGULAIR) 10 MG tablet Take 10 mg by mouth daily. 01/16/20   [provider]  Multiple Minerals-Vitamins (CALCIUM & VIT D3 BONE HEALTH PO) Take 1 tablet by mouth 2 (two) times a week.    [provider]   nitroGLYCERIN (NITROSTAT) 0.4 MG SL tablet Place 1 tablet (0.4 mg total) under the tongue every 5 (five) minutes as needed for chest pain. 08/07/13   Yates Decamp, MD  omeprazole (PRILOSEC) 40 MG capsule Take 40 mg by mouth daily as needed (acid reflux).  11/14/17   [provider]  spironolactone (ALDACTONE) 25 MG tablet Take 25 mg by mouth daily.    [provider]    Allergies  Allergen Reactions   Penicillins Rash    Has patient had a PCN reaction causing immediate rash, facial/tongue/throat swelling, SOB or lightheadedness with hypotension: No Has patient had a PCN reaction causing severe rash involving mucus membranes or skin necrosis: Unknown Has patient had a PCN reaction that required hospitalization: No Has patient had a PCN reaction occurring within the last 10 years: No If all of the above answers are "NO", then may proceed with Cephalosporin use.     REVIEW OF SYSTEMS:  General: no fevers/chills/night sweats Eyes: no blurry vision, diplopia, or amaurosis ENT: no sore throat or hearing loss Resp: no cough, wheezing, or hemoptysis CV: no edema or palpitations GI: no abdominal pain, nausea, vomiting, diarrhea, or constipation GU: no dysuria, frequency, or hematuria Skin: no rash Neuro: no headache, numbness, tingling, or weakness of extremities Musculoskeletal: no joint pain or swelling Heme: no bleeding, DVT, or easy bruising Endo: no polydipsia or polyuria  BP 100/60   Pulse 74   Ht 5\' 5"  (1.651 m)   Wt 202 lb 6.4 oz (91.8 kg)   SpO2 98%   BMI 33.68 kg/m   PHYSICAL EXAM: GEN:  AO x 3 in no acute distress HEENT: normal Dentition: Normal Neck: JVP normal. +2 carotid upstrokes without bruits. No thyromegaly. Lungs: equal expansion, clear bilaterally CV: Apex is discrete and nondisplaced, RRR with 3/6 SEM Abd: soft, non-tender, non-distended; no bruit; positive bowel sounds Ext: no edema, ecchymoses, or cyanosis Vascular: 2+ femoral pulses, 2+  radial pulses       Skin: warm and dry without rash Neuro: CN II-XII grossly intact; motor and sensory grossly intact    DATA AND STUDIES:  EKG: EKG performed August 2023 that I personally reviewed demonstrates sinus rhythm with a right bundle branch block  EKG Interpretation Date/Time:  Monday August 22 2023 08:09:21 EST Ventricular Rate:  74 PR Interval:  174 QRS Duration:  134 QT Interval:  422 QTC Calculation: 468 R Axis:   -16  Text Interpretation: Normal sinus rhythm Right bundle branch block Minimal voltage criteria for LVH, may be normal variant ( R in aVL ) Inferior infarct , age undetermined When compared with ECG of 28-Feb-2022 17:45, Inferior infarct is now Present Confirmed by Alverda Skeans ([id number]) on 08/22/2023 8:12:20 AM        Cardiac Studies & Procedures   ______________________________________________________________________________________________   STRESS TESTS  NM MYOCAR MULTI W/SPECT W 10/24/2017  Narrative CLINICAL DATA:  Chest pain, preop evaluation  EXAM: MYOCARDIAL IMAGING WITH SPECT (REST AND PHARMACOLOGIC-STRESS)  GATED LEFT VENTRICULAR WALL MOTION STUDY  LEFT VENTRICULAR EJECTION FRACTION  TECHNIQUE: Standard myocardial SPECT imaging was performed after resting intravenous injection of 10 mCi Tc-64m tetrofosmin. Subsequently, intravenous infusion of Lexiscan was performed under the supervision of the Cardiology staff. At peak effect of the drug, 30 mCi Tc-29m tetrofosmin was injected intravenously and standard myocardial SPECT imaging was performed. Quantitative gated imaging was also performed to evaluate left ventricular wall motion, and estimate left ventricular ejection fraction.  COMPARISON:  None.  FINDINGS: Perfusion: No decreased activity in the left ventricle on stress imaging to suggest reversible ischemia or infarction.  Wall Motion: Normal left ventricular wall motion. No left ventricular dilation.  Left Ventricular  Ejection Fraction: 72 %  End diastolic volume 61 ml  End systolic volume 17 ml  IMPRESSION: 1. No reversible ischemia or infarction.  2. Normal left ventricular wall motion.  3. Left ventricular ejection fraction 72%  4. Non invasive risk stratification*: Low  *2012 Appropriate Use Criteria for Coronary Revascularization Focused Update: J Am Coll Cardiol. 2012;59(9):857-881. http://content.dementiazones.com.aspx?articleid=[email or web address]   Electronically Signed By: Judie Petit.  Shick M.D. On: 10/24/2017 15:37   ECHOCARDIOGRAM  ECHOCARDIOGRAM COMPLETE 08/12/2023  Narrative ECHOCARDIOGRAM REPORT    Patient Name:   SHENICE DOLDER Date of Exam: 08/12/2023 Medical Rec #:  [id number]     Height:       65.0 in Accession #:    [id number]    Weight:       198.0 lb Date of Birth:  October 22, 1940    BSA:          1.970 m Patient Age:    82 years      BP:           123/74 mmHg Patient Gender: F             HR:           66 bpm. Exam Location:  Outpatient  Procedure: 2D Echo, Cardiac Doppler, Color Doppler and Strain Analysis  Indications:    Aortic valve stenosis, moderate [I35.0]  History:        Patient has prior history of Echocardiogram examinations. CAD.  Sonographer:    Neysa Bonito Roar Referring Phys: [phone] [facility]   Sonographer Comments: Global longitudinal strain was attempted. IMPRESSIONS   1. Left ventricular ejection fraction, by estimation, is 60 to 65%. The left ventricle has normal function. The left ventricle has no regional wall motion abnormalities. There is severe left ventricular hypertrophy. Left ventricular diastolic parameters are consistent with Grade I diastolic dysfunction (impaired relaxation). Elevated left ventricular end-diastolic pressure. The average left ventricular global longitudinal strain is -20.4 %. The global longitudinal strain is normal. 2. Right ventricular systolic function is normal. The right ventricular size is normal. 3. Left atrial size was  moderately dilated.  4. The mitral valve is abnormal. Trivial mitral valve regurgitation. No evidence of mitral stenosis. 5. AS moderate by gradients and DVI but severe based on AVA 0.7cm2 using VTI. Valve is severely calcified . The aortic valve is tricuspid. There is severe calcifcation of the aortic valve. There is severe thickening of the aortic valve. Aortic valve regurgitation is trivial. Moderate to severe aortic valve stenosis. 6. The inferior vena cava is normal in size with greater than 50% respiratory variability, suggesting right atrial pressure of 3 mmHg.  FINDINGS Left Ventricle: Left ventricular ejection fraction, by estimation, is 60 to 65%. The left ventricle has normal function. The left ventricle has no regional wall motion abnormalities. The average left ventricular global longitudinal strain is -20.4 %. The global longitudinal strain is normal. The left ventricular internal cavity size was normal in size. There is severe left ventricular hypertrophy. Left ventricular diastolic parameters are consistent with Grade I diastolic dysfunction (impaired relaxation). Elevated left ventricular end-diastolic pressure.  Right Ventricle: The right ventricular size is normal. No increase in right ventricular wall thickness. Right ventricular systolic function is normal.  Left Atrium: Left atrial size was moderately dilated.  Right Atrium: Right atrial size was normal in size.  Pericardium: There is no evidence of pericardial effusion.  Mitral Valve: The mitral valve is abnormal. There is mild thickening of the mitral valve leaflet(s). Mild mitral annular calcification. Trivial mitral valve regurgitation. No evidence of mitral valve stenosis. MV peak gradient, 6.2 mmHg. The mean mitral valve gradient is 2.0 mmHg.  Tricuspid Valve: The tricuspid valve is normal in structure. Tricuspid valve regurgitation is mild . No evidence of tricuspid stenosis.  Aortic Valve: AS moderate by gradients  and DVI but severe based on AVA 0.7cm2 using VTI. Valve is severely calcified. The aortic valve is tricuspid. There is severe calcifcation of the aortic valve. There is severe thickening of the aortic valve. Aortic valve regurgitation is trivial. Moderate to severe aortic stenosis is present. Aortic valve mean gradient measures 27.5 mmHg. Aortic valve peak gradient measures 53.0 mmHg. Aortic valve area, by VTI measures 0.70 cm.  Pulmonic Valve: The pulmonic valve was normal in structure. Pulmonic valve regurgitation is not visualized. No evidence of pulmonic stenosis.  Aorta: The aortic root is normal in size and structure.  Venous: The inferior vena cava is normal in size with greater than 50% respiratory variability, suggesting right atrial pressure of 3 mmHg.  IAS/Shunts: No atrial level shunt detected by color flow Doppler.   LEFT VENTRICLE PLAX 2D LVIDd:         3.60 cm   Diastology LVIDs:         2.30 cm   LV e' medial:    4.68 cm/s LV PW:         1.30 cm   LV E/e' medial:  15.4 LV IVS:        1.60 cm   LV e' lateral:   4.57 cm/s LVOT diam:     1.70 cm   LV E/e' lateral: 15.8 LV SV:         62 LV SV Index:   31        2D Longitudinal Strain LVOT Area:     2.27 cm  2D Strain GLS Avg:     -20.4 %   RIGHT VENTRICLE RV Basal diam:  3.30 cm RV Mid diam:    2.70 cm RV S prime:     9.36 cm/s TAPSE (M-mode): 1.9 cm  LEFT ATRIUM  Index        RIGHT ATRIUM           Index LA diam:        4.30 cm 2.18 cm/m   RA Area:     12.00 cm LA Vol (A2C):   67.9 ml 34.47 ml/m  RA Volume:   24.90 ml  12.64 ml/m LA Vol (A4C):   64.8 ml 32.90 ml/m LA Biplane Vol: 68.8 ml 34.93 ml/m AORTIC VALVE                     PULMONIC VALVE AV Area (Vmax):    0.75 cm      PV Vmax:        0.81 m/s AV Area (Vmean):   0.82 cm      PV Peak grad:   2.6 mmHg AV Area (VTI):     0.70 cm      RVOT Peak grad: 1 mmHg AV Vmax:           364.00 cm/s AV Vmean:          244.500 cm/s AV VTI:             0.883 m AV Peak Grad:      53.0 mmHg AV Mean Grad:      27.5 mmHg LVOT Vmax:         120.00 cm/s LVOT Vmean:        87.900 cm/s LVOT VTI:          0.271 m LVOT/AV VTI ratio: 0.31  AORTA Ao Sinus diam: 3.00 cm Ao STJ diam:   2.6 cm Ao Asc diam:   3.30 cm  MITRAL VALVE                TRICUSPID VALVE MV Area (PHT): 2.61 cm     TR Peak grad:   25.4 mmHg MV Area VTI:   2.20 cm     TR Vmax:        252.00 cm/s MV Peak grad:  6.2 mmHg MV Mean grad:  2.0 mmHg     SHUNTS MV Vmax:       1.24 m/s     Systemic VTI:  0.27 m MV Vmean:      62.6 cm/s    Systemic Diam: 1.70 cm MV Decel Time: 291 msec MV E velocity: 72.30 cm/s MV A velocity: 106.00 cm/s MV E/A ratio:  0.68  Charlton Haws MD Electronically signed by Charlton Haws MD Signature Date/Time: 08/12/2023/10:35:52 AM    Final          ______________________________________________________________________________________________      No results found for requested labs within last 365 days.   STS RISK CALCULATOR: Pending  NHYA CLASS: 2    ASSESSMENT AND PLAN:   1. Nonrheumatic aortic valve stenosis   2. Right bundle branch block   3. Diastolic dysfunction   4. Coronary artery disease involving native coronary artery of native heart without angina pectoris   5. Hyperlipidemia LDL goal <70   6. Primary hypertension   7. Aortic atherosclerosis (HCC)   8. Malignant neoplasm of upper-outer quadrant of right breast in female, estrogen receptor positive (HCC)   9. Stage 3a chronic kidney disease (HCC)   10. BMI 38.0-38.9,adult   11. Pre-procedure lab exam     Aortic stenosis: The patient has developed symptoms attributable to her paradoxical low-flow low gradient aortic stenosis.  Will refer her for coronary angiography study, right heart catheterization, TAVR  protocol CTA, and cardiothoracic surgical opinion.  Further recommendations to be informed by review of this collection of testing. Right bundle branch block: At  higher risk for needing a pacemaker after an aortic valve intervention.  CTA will be able to assess risk with MS length. Diastolic dysfunction: Continue Toprol 50 mg daily and spironolactone 12.5 mg daily.  Patient looks euvolemic today. CAD: Continue aspirin 81 mg, as needed nitroglycerin, Imdur 60 mg, Toprol 50 mg, and Crestor 20 mg. Hyperlipidemia: Continue Crestor 20 mg. Hypertension: Continue spironolactone 25 mg, Toprol 50 mg, and amlodipine 2.5 mg.  BP is well-controlled. Aortic atherosclerosis: Continue aspirin 81 mg and Crestor 20 mg. History of breast cancer: Continue to monitor. 3A chronic kidney disease: Consider ARB for renal protection after aortic stenosis has been managed. Elevated BMI: Diet and exercise interventions can be pursued once aortic stenosis has been managed.   I have personally reviewed the patients imaging data as summarized above.  I have reviewed the natural history of aortic stenosis with the patient and family members who are present today. We have discussed the limitations of medical therapy and the poor prognosis associated with symptomatic aortic stenosis. We have also reviewed potential treatment options, including palliative medical therapy, conventional surgical aortic valve replacement, and transcatheter aortic valve replacement. We discussed treatment options in the context of this patient's specific comorbid medical conditions.   All of the patient's questions were answered today. Will make further recommendations based on the results of studies outlined above.      Orbie Pyo, MD  08/22/2023 9:19 AM    [facility] [address] [place], [place], [place]  [address] Phone: [phone]; Fax: [phone]

## 2023-08-22 ENCOUNTER — Encounter: Payer: Self-pay | Admitting: Internal Medicine

## 2023-08-22 ENCOUNTER — Ambulatory Visit: Payer: Medicare HMO | Attending: Internal Medicine | Admitting: Internal Medicine

## 2023-08-22 VITALS — BP 100/60 | HR 74 | Ht 65.0 in | Wt 202.4 lb

## 2023-08-22 DIAGNOSIS — I5189 Other ill-defined heart diseases: Secondary | ICD-10-CM | POA: Diagnosis not present

## 2023-08-22 DIAGNOSIS — Z01812 Encounter for preprocedural laboratory examination: Secondary | ICD-10-CM

## 2023-08-22 DIAGNOSIS — Z170 Estrogen receptor positive status [ER+]: Secondary | ICD-10-CM

## 2023-08-22 DIAGNOSIS — I2510 Atherosclerotic heart disease of native coronary artery without angina pectoris: Secondary | ICD-10-CM

## 2023-08-22 DIAGNOSIS — I700 Atherosclerosis of aorta: Secondary | ICD-10-CM

## 2023-08-22 DIAGNOSIS — N1831 Chronic kidney disease, stage 3a: Secondary | ICD-10-CM

## 2023-08-22 DIAGNOSIS — I350 Nonrheumatic aortic (valve) stenosis: Secondary | ICD-10-CM

## 2023-08-22 DIAGNOSIS — E785 Hyperlipidemia, unspecified: Secondary | ICD-10-CM

## 2023-08-22 DIAGNOSIS — I4510 Unspecified right bundle-branch block: Secondary | ICD-10-CM

## 2023-08-22 DIAGNOSIS — I10 Essential (primary) hypertension: Secondary | ICD-10-CM

## 2023-08-22 DIAGNOSIS — C50411 Malignant neoplasm of upper-outer quadrant of right female breast: Secondary | ICD-10-CM

## 2023-08-22 DIAGNOSIS — Z6838 Body mass index (BMI) 38.0-38.9, adult: Secondary | ICD-10-CM

## 2023-08-22 LAB — CBC

## 2023-08-22 NOTE — Patient Instructions (Addendum)
 Medication Instructions:  Your physician recommends that you continue on your current medications as directed. Please refer to the Current Medication list given to you today.  *If you need a refill on your cardiac medications before your next appointment, please call your pharmacy*  Lab Work: TODAY: CBC, BMET If you have labs (blood work) drawn today and your tests are completely normal, you will receive your results only by: MyChart Message (if you have MyChart) OR A paper copy in the mail If you have any lab test that is abnormal or we need to change your treatment, we will call you to review the results.  Testing/Procedures: Your physician has requested that you have a cardiac catheterization. Cardiac catheterization is used to diagnose and/or treat various heart conditions. Doctors may recommend this procedure for a number of different reasons. The most common reason is to evaluate chest pain. Chest pain can be a symptom of coronary artery disease (CAD), and cardiac catheterization can show whether plaque is narrowing or blocking your heart's arteries. This procedure is also used to evaluate the valves, as well as measure the blood flow and oxygen levels in different parts of your heart. For further information please visit [email or web address] Please follow instruction sheet, as given.  Follow-Up: We will follow-up with you to arrange follow-up after your heart catheterization.   Other Instructions       Cardiac/Peripheral Catheterization   You are scheduled for a Cardiac Catheterization on Tuesday, February 25 with Dr. Tonny Bollman.  1. Please arrive at the [facility] (Main Entrance A) at [facility]: [address] [place], [place] [address] at 8:30 AM (This time is 2 hour(s) before your procedure to ensure your preparation).   Free valet parking service is available. You will check in at ADMITTING. The support person will be asked to wait in the waiting room.  It is  OK to have someone drop you off and come back when you are ready to be discharged.        Special note: Every effort is made to have your procedure done on time. Please understand that emergencies sometimes delay scheduled procedures.  2. Diet: Do not eat solid foods after midnight.  You may have clear liquids until 5 AM the day of the procedure.  3. Labs: CBC, BMET TODAY  4. Medication instructions in preparation for your procedure:   Contrast Allergy: No  Do not take spironolactone the morning of your procedure.  On the morning of your procedure, take Aspirin 81 mg and any morning medicines NOT listed above.  You may use sips of water.  5. Plan to go home the same day, you will only stay overnight if medically necessary. 6. You MUST have a responsible adult to drive you home. 7. An adult MUST be with you the first 24 hours after you arrive home. 8. Bring a current list of your medications, and the last time and date medication taken. 9. Bring ID and current insurance cards. 10.Please wear clothes that are easy to get on and off and wear slip-on shoes.  Thank you for allowing [place] to care for you!   -- [facility]

## 2023-08-22 NOTE — Progress Notes (Addendum)
 Pre Surgical Assessment: 5 M Walk Test  16M=16.53ft  5 Meter Walk Test- trial 1: 8.13 seconds 5 Meter Walk Test- trial 2: 6.81 seconds 5 Meter Walk Test- trial 3: 7.03 seconds 5 Meter Walk Test Average: 7.32 seconds   ______________________  Procedure Type: Isolated AVR Perioperative Outcome Estimate % Operative Mortality 3.68% Morbidity & Mortality 9.4% Stroke 1.23% Renal Failure 1.37% Reoperation 3.42% Prolonged Ventilation 5.39% Deep Sternal Wound Infection 0.067% Long Hospital Stay (>14 days) 4.6% Short Hospital Stay (<6 days)* 37.6%

## 2023-08-23 LAB — CBC
Hematocrit: 44.3 % (ref 34.0–46.6)
Hemoglobin: 14.5 g/dL (ref 11.1–15.9)
MCH: 27.7 pg (ref 26.6–33.0)
MCHC: 32.7 g/dL (ref 31.5–35.7)
MCV: 85 fL (ref 79–97)
Platelets: 154 10*3/uL (ref 150–450)
RBC: 5.24 x10E6/uL (ref 3.77–5.28)
RDW: 15.1 % (ref 11.7–15.4)
WBC: 6.2 10*3/uL (ref 3.4–10.8)

## 2023-08-23 LAB — BASIC METABOLIC PANEL
BUN/Creatinine Ratio: 20 (ref 12–28)
BUN: 16 mg/dL (ref 8–27)
CO2: 21 mmol/L (ref 20–29)
Calcium: 9.5 mg/dL (ref 8.7–10.3)
Chloride: 108 mmol/L — ABNORMAL HIGH (ref 96–106)
Creatinine, Ser: 0.8 mg/dL (ref 0.57–1.00)
Glucose: 98 mg/dL (ref 70–99)
Potassium: 4.7 mmol/L (ref 3.5–5.2)
Sodium: 145 mmol/L — ABNORMAL HIGH (ref 134–144)
eGFR: 74 mL/min/{1.73_m2} (ref >59)

## 2023-08-29 ENCOUNTER — Telehealth: Payer: Self-pay | Admitting: *Deleted

## 2023-08-29 NOTE — Telephone Encounter (Signed)
 Cardiac Catheterization scheduled at [facility] for: Tuesday August 30, 2023 10:30 AM Arrival time [facility] Main Entrance A at: 8:30 AM  Nothing to eat after midnight prior to procedure, clear liquids until 5 AM day of procedure.  Medication instructions: -Hold:  Spironolactone-AM of procedure  -Other usual morning medications can be taken with sips of water including aspirin 81 mg.  Plan to go home the same day, you will only stay overnight if medically necessary.  You must have responsible adult to drive you home.  Someone must be with you the first 24 hours after you arrive home.  Reviewed procedure instructions with patient.

## 2023-08-30 ENCOUNTER — Other Ambulatory Visit: Payer: Self-pay

## 2023-08-30 ENCOUNTER — Ambulatory Visit (HOSPITAL_COMMUNITY)
Admission: RE | Admit: 2023-08-30 | Discharge: 2023-08-30 | Disposition: A | Discharge status: Home / Self Care | Payer: Medicare HMO | Attending: Cardiovascular Disease | Admitting: Cardiovascular Disease

## 2023-08-30 ENCOUNTER — Encounter (HOSPITAL_COMMUNITY)
Admission: RE | Disposition: A | Discharge status: Home / Self Care | Payer: Self-pay | Source: Home / Self Care | Attending: Cardiovascular Disease

## 2023-08-30 DIAGNOSIS — Z79899 Other long term (current) drug therapy: Secondary | ICD-10-CM | POA: Diagnosis not present

## 2023-08-30 DIAGNOSIS — N1831 Chronic kidney disease, stage 3a: Secondary | ICD-10-CM | POA: Diagnosis not present

## 2023-08-30 DIAGNOSIS — I4510 Unspecified right bundle-branch block: Secondary | ICD-10-CM | POA: Diagnosis not present

## 2023-08-30 DIAGNOSIS — I700 Atherosclerosis of aorta: Secondary | ICD-10-CM | POA: Diagnosis not present

## 2023-08-30 DIAGNOSIS — E785 Hyperlipidemia, unspecified: Secondary | ICD-10-CM | POA: Insufficient documentation

## 2023-08-30 DIAGNOSIS — Z87891 Personal history of nicotine dependence: Secondary | ICD-10-CM | POA: Insufficient documentation

## 2023-08-30 DIAGNOSIS — I350 Nonrheumatic aortic (valve) stenosis: Secondary | ICD-10-CM

## 2023-08-30 DIAGNOSIS — Z7982 Long term (current) use of aspirin: Secondary | ICD-10-CM | POA: Insufficient documentation

## 2023-08-30 DIAGNOSIS — Z853 Personal history of malignant neoplasm of breast: Secondary | ICD-10-CM | POA: Diagnosis not present

## 2023-08-30 DIAGNOSIS — I129 Hypertensive chronic kidney disease with stage 1 through stage 4 chronic kidney disease, or unspecified chronic kidney disease: Secondary | ICD-10-CM | POA: Diagnosis not present

## 2023-08-30 DIAGNOSIS — Z955 Presence of coronary angioplasty implant and graft: Secondary | ICD-10-CM | POA: Diagnosis not present

## 2023-08-30 DIAGNOSIS — I2510 Atherosclerotic heart disease of native coronary artery without angina pectoris: Secondary | ICD-10-CM | POA: Insufficient documentation

## 2023-08-30 LAB — POCT I-STAT EG7
Acid-Base Excess: 0 mmol/L (ref 0.0–2.0)
Acid-base deficit: 8 mmol/L — ABNORMAL HIGH (ref 0.0–2.0)
Bicarbonate: 19.7 mmol/L — ABNORMAL LOW (ref 20.0–28.0)
Bicarbonate: 26.6 mmol/L (ref 20.0–28.0)
Calcium, Ion: 0.98 mmol/L — ABNORMAL LOW (ref 1.15–1.40)
Calcium, Ion: 1.21 mmol/L (ref 1.15–1.40)
HCT: 40 % (ref 36.0–46.0)
HCT: 40 % (ref 36.0–46.0)
Hemoglobin: 13.6 g/dL (ref 12.0–15.0)
Hemoglobin: 13.6 g/dL (ref 12.0–15.0)
O2 Saturation: 66 %
O2 Saturation: 76 %
Potassium: 3.1 mmol/L — ABNORMAL LOW (ref 3.5–5.1)
Potassium: 3.9 mmol/L (ref 3.5–5.1)
Sodium: 123 mmol/L — ABNORMAL LOW (ref 135–145)
Sodium: 141 mmol/L (ref 135–145)
TCO2: 21 mmol/L — ABNORMAL LOW (ref 22–32)
TCO2: 28 mmol/L (ref 22–32)
pCO2, Ven: 49.7 mmHg (ref 44–60)
pCO2, Ven: 49.7 mmHg (ref 44–60)
pH, Ven: 7.206 — ABNORMAL LOW (ref 7.25–7.43)
pH, Ven: 7.336 (ref 7.25–7.43)
pO2, Ven: 42 mmHg (ref 32–45)
pO2, Ven: 44 mmHg (ref 32–45)

## 2023-08-30 LAB — POCT I-STAT 7, (LYTES, BLD GAS, ICA,H+H)
Acid-base deficit: 1 mmol/L (ref 0.0–2.0)
Bicarbonate: 24.7 mmol/L (ref 20.0–28.0)
Calcium, Ion: 1.16 mmol/L (ref 1.15–1.40)
HCT: 40 % (ref 36.0–46.0)
Hemoglobin: 13.6 g/dL (ref 12.0–15.0)
O2 Saturation: 93 %
Potassium: 3.8 mmol/L (ref 3.5–5.1)
Sodium: 142 mmol/L (ref 135–145)
TCO2: 26 mmol/L (ref 22–32)
pCO2 arterial: 43.1 mmHg (ref 32–48)
pH, Arterial: 7.366 (ref 7.35–7.45)
pO2, Arterial: 69 mmHg — ABNORMAL LOW (ref 83–108)

## 2023-08-30 SURGERY — RIGHT HEART CATH AND CORONARY ANGIOGRAPHY
Anesthesia: LOCAL

## 2023-08-30 MED ORDER — SODIUM CHLORIDE 0.9 % IV SOLN: INTRAVENOUS | Status: DC

## 2023-08-30 MED ORDER — FENTANYL CITRATE (PF) 100 MCG/2ML IJ SOLN
INTRAMUSCULAR | Status: DC | PRN
  Administered 2023-08-30 (×2): 25 ug via INTRAVENOUS

## 2023-08-30 MED ORDER — ACETAMINOPHEN 325 MG PO TABS: 650.0000 mg | ORAL_TABLET | ORAL | Status: DC | PRN

## 2023-08-30 MED ORDER — VERAPAMIL HCL 2.5 MG/ML IV SOLN
INTRAVENOUS | Status: DC | PRN
  Administered 2023-08-30: 10 mL via INTRA_ARTERIAL

## 2023-08-30 MED ORDER — HEPARIN SODIUM (PORCINE) 1000 UNIT/ML IJ SOLN
INTRAMUSCULAR | Status: DC | PRN
  Administered 2023-08-30: 5000 [IU] via INTRA_ARTERIAL

## 2023-08-30 MED ORDER — HEPARIN SODIUM (PORCINE) 1000 UNIT/ML IJ SOLN
INTRAMUSCULAR | Status: AC
Start: 2023-08-30 — End: ?
  Filled 2023-08-30: qty 10

## 2023-08-30 MED ORDER — ONDANSETRON HCL 4 MG/2ML IJ SOLN: 4.0000 mg | Freq: Four times a day (QID) | INTRAMUSCULAR | Status: DC | PRN

## 2023-08-30 MED ORDER — LIDOCAINE HCL (PF) 1 % IJ SOLN
INTRAMUSCULAR | Status: DC | PRN
  Administered 2023-08-30 (×2): 2 mL

## 2023-08-30 MED ORDER — HYDRALAZINE HCL 20 MG/ML IJ SOLN: 10.0000 mg | INTRAMUSCULAR | Status: DC | PRN

## 2023-08-30 MED ORDER — ALUM & MAG HYDROXIDE-SIMETH 200-200-20 MG/5ML PO SUSP
15.0000 mL | ORAL | Status: AC
  Administered 2023-08-30: 15 mL via ORAL
  Filled 2023-08-30: qty 30

## 2023-08-30 MED ORDER — FENTANYL CITRATE (PF) 100 MCG/2ML IJ SOLN
INTRAMUSCULAR | Status: AC
  Filled 2023-08-30: qty 2

## 2023-08-30 MED ORDER — LIDOCAINE HCL (PF) 1 % IJ SOLN
INTRAMUSCULAR | Status: AC
Start: 2023-08-30 — End: ?
  Filled 2023-08-30: qty 30

## 2023-08-30 MED ORDER — SODIUM CHLORIDE 0.9% FLUSH: 3.0000 mL | Freq: Two times a day (BID) | INTRAVENOUS | Status: DC

## 2023-08-30 MED ORDER — SODIUM CHLORIDE 0.9 % IV SOLN: 250.0000 mL | INTRAVENOUS | Status: DC | PRN

## 2023-08-30 MED ORDER — MIDAZOLAM HCL 2 MG/2ML IJ SOLN
INTRAMUSCULAR | Status: AC
  Filled 2023-08-30: qty 2

## 2023-08-30 MED ORDER — MIDAZOLAM HCL 2 MG/2ML IJ SOLN
INTRAMUSCULAR | Status: DC | PRN
  Administered 2023-08-30 (×2): 1 mg via INTRAVENOUS

## 2023-08-30 MED ORDER — SODIUM CHLORIDE 0.9% FLUSH: 3.0000 mL | INTRAVENOUS | Status: DC | PRN

## 2023-08-30 MED ORDER — ASPIRIN 81 MG PO CHEW: 81.0000 mg | CHEWABLE_TABLET | ORAL | Status: DC

## 2023-08-30 MED ORDER — SODIUM CHLORIDE 0.9 % WEIGHT BASED INFUSION: 3.0000 mL/kg/h | INTRAVENOUS | Status: DC

## 2023-08-30 MED ORDER — SODIUM CHLORIDE 0.9 % WEIGHT BASED INFUSION: 1.0000 mL/kg/h | INTRAVENOUS | Status: DC

## 2023-08-30 MED ORDER — VERAPAMIL HCL 2.5 MG/ML IV SOLN
INTRAVENOUS | Status: AC
Start: 2023-08-30 — End: ?
  Filled 2023-08-30: qty 2

## 2023-08-30 MED ORDER — LABETALOL HCL 5 MG/ML IV SOLN: 10.0000 mg | INTRAVENOUS | Status: DC | PRN

## 2023-08-30 MED ORDER — IOHEXOL 350 MG/ML SOLN
INTRAVENOUS | Status: DC | PRN
  Administered 2023-08-30: 50 mL

## 2023-08-30 SURGICAL SUPPLY — 10 items
CATH BALLN WEDGE 5F 110CM (CATHETERS) IMPLANT
CATH INFINITI 5FR ANG PIGTAIL (CATHETERS) IMPLANT
CATH INFINITI AMBI 6FR TG (CATHETERS) IMPLANT
GLIDESHEATH SLEND SS 6F .021 (SHEATH) IMPLANT
GUIDEWIRE .025 260CM (WIRE) IMPLANT
KIT SINGLE USE MANIFOLD (KITS) IMPLANT
PACK CARDIAC CATHETERIZATION (CUSTOM PROCEDURE TRAY) ×1 IMPLANT
SET ATX-X65L (MISCELLANEOUS) IMPLANT
SHEATH GLIDE SLENDER 4/5FR (SHEATH) IMPLANT
WIRE EMERALD 3MM-J .035X260CM (WIRE) IMPLANT

## 2023-08-30 NOTE — Discharge Instructions (Signed)

## 2023-08-30 NOTE — Interval H&P Note (Signed)
 History and Physical Interval Note:  08/30/2023 9:08 AM  Carolyn Rowland  has presented today for surgery, with the diagnosis of aortic stenosis.  The various methods of treatment have been discussed with the patient and family. After consideration of risks, benefits and other options for treatment, the patient has consented to  Procedure(s): RIGHT/LEFT HEART CATH AND CORONARY ANGIOGRAPHY (N/A) as a surgical intervention.  The patient's history has been reviewed, patient examined, no change in status, stable for surgery.  I have reviewed the patient's chart and labs.  Questions were answered to the patient's satisfaction.     Orbie Pyo

## 2023-08-30 NOTE — Progress Notes (Signed)
 TR BAND REMOVAL  LOCATION:    right radial  DEFLATED PER PROTOCOL:    Yes.    TIME BAND OFF / DRESSING APPLIED:    1625 gauze dressing applied   SITE UPON ARRIVAL:    Level 0  SITE AFTER BAND REMOVAL:    Level 0  CIRCULATION SENSATION AND MOVEMENT:    Within Normal Limits   Yes.    COMMENTS:   no issues noted

## 2023-08-31 ENCOUNTER — Encounter (HOSPITAL_COMMUNITY): Payer: Self-pay | Admitting: Internal Medicine

## 2023-09-06 ENCOUNTER — Encounter (HOSPITAL_COMMUNITY): Payer: Self-pay

## 2023-09-06 ENCOUNTER — Other Ambulatory Visit (HOSPITAL_COMMUNITY): Payer: Medicare HMO

## 2023-09-19 ENCOUNTER — Other Ambulatory Visit: Payer: Self-pay | Admitting: Physician Assistant

## 2023-09-19 DIAGNOSIS — I350 Nonrheumatic aortic (valve) stenosis: Secondary | ICD-10-CM

## 2023-09-20 ENCOUNTER — Ambulatory Visit (HOSPITAL_COMMUNITY)
Admission: RE | Admit: 2023-09-20 | Discharge: 2023-09-20 | Disposition: A | Discharge status: Home / Self Care | Payer: Medicare HMO | Source: Ambulatory Visit | Attending: *Deleted | Admitting: *Deleted

## 2023-09-20 ENCOUNTER — Encounter: Payer: Self-pay | Admitting: Physician Assistant

## 2023-09-20 DIAGNOSIS — I350 Nonrheumatic aortic (valve) stenosis: Secondary | ICD-10-CM | POA: Diagnosis not present

## 2023-09-20 MED ORDER — IOHEXOL 350 MG/ML SOLN: 100.0000 mL | Freq: Once | INTRAVENOUS | Status: DC | PRN

## 2023-09-20 MED ORDER — IOHEXOL 350 MG/ML SOLN
100.0000 mL | Freq: Once | INTRAVENOUS | Status: AC | PRN
  Administered 2023-09-20: 100 mL via INTRAVENOUS

## 2023-09-26 ENCOUNTER — Ambulatory Visit (HOSPITAL_COMMUNITY)
Admission: RE | Admit: 2023-09-26 | Discharge: 2023-09-26 | Disposition: A | Discharge status: Home / Self Care | Source: Ambulatory Visit | Attending: Internal Medicine | Admitting: Internal Medicine

## 2023-09-26 DIAGNOSIS — I2510 Atherosclerotic heart disease of native coronary artery without angina pectoris: Secondary | ICD-10-CM | POA: Diagnosis not present

## 2023-09-26 DIAGNOSIS — I700 Atherosclerosis of aorta: Secondary | ICD-10-CM | POA: Insufficient documentation

## 2023-09-26 DIAGNOSIS — Z01810 Encounter for preprocedural cardiovascular examination: Secondary | ICD-10-CM | POA: Insufficient documentation

## 2023-09-26 DIAGNOSIS — I350 Nonrheumatic aortic (valve) stenosis: Secondary | ICD-10-CM | POA: Insufficient documentation

## 2023-09-26 DIAGNOSIS — J439 Emphysema, unspecified: Secondary | ICD-10-CM | POA: Insufficient documentation

## 2023-09-26 MED ORDER — IOHEXOL 350 MG/ML SOLN
100.0000 mL | Freq: Once | INTRAVENOUS | Status: AC | PRN
  Administered 2023-09-26: 100 mL via INTRAVENOUS

## 2023-09-28 NOTE — Progress Notes (Unsigned)
 [address]       [place] [address]             [phone]           Carolyn Rowland [facility] Medical Record #[id number] Date of Birth: 04/13/1941  Rinaldo Cloud, MD Estevan Oaks, NP  Chief Complaint:   increasing DOE and fatigue  History of Present Illness:     Pt is an 83 yo female who has been having increasing DOE and fatigue. She also reports having "spells" that would make it momentarily feel like she was "going out". She has not had these spells since she has had catheterization. She had an echo that has shown normal EF of 60% and what was felt to be paradoxical low flow low gradient severe AS with a mean gradient of and a DVI of 0.31 and a calculated valve area of 0.7 cms; She was evaluated by cardiology and felt to require AVR and underwent cath without significant CAD with widely patent previous stents. She underwent TAVR CTA which has shown acceptable anatomy for femoral access and either sapien or medtronic valves. She has a IRBBB and would benefit from internal jugular pacing. She feels she is quite active individual      Past Medical History:  Diagnosis Date   Anemia    in younger years   Arthritis    "hands; legs" (08/17/2013)   CAP (community acquired pneumonia)    Coronary artery disease    6 stents in 2015   GERD (gastroesophageal reflux disease)    H/O hiatal hernia    High cholesterol    Hx of adenomatous colonic polyps 08/15/2018   Hypertension    Radiation 11/28/14-01/02/15   Right breast 50 Gray   Severe aortic stenosis    Vertigo     Past Surgical History:  Procedure Laterality Date   ABDOMINAL AORTOGRAM N/A 08/30/2023   Procedure: ABDOMINAL AORTOGRAM;  Surgeon: Orbie Pyo, MD;  Location: [facility] INVASIVE CV LAB;  Service: Cardiovascular;  Laterality: N/A;   ABDOMINAL HYSTERECTOMY  1985   APPENDECTOMY  1960   BREAST LUMPECTOMY WITH NEEDLE LOCALIZATION AND AXILLARY SENTINEL LYMPH NODE BX Right 10/17/2014    Procedure: BREAST LUMPECTOMY WITH NEEDLE LOCALIZATION AND AXILLARY SENTINEL LYMPH NODE BX;  Surgeon: Avel Peace, MD;  Location: [facility] OR;  Service: General;  Laterality: Right;   CARDIAC CATHETERIZATION  08/07/2013   COLONOSCOPY     CORONARY ANGIOPLASTY WITH STENT PLACEMENT  08/17/2013   "6 stents"(08/17/2013)   LEFT AND RIGHT HEART CATHETERIZATION WITH CORONARY ANGIOGRAM N/A 08/07/2013   Procedure: LEFT AND RIGHT HEART CATHETERIZATION WITH CORONARY ANGIOGRAM;  Surgeon: Pamella Pert, MD;  Location: [facility] CATH LAB;  Service: Cardiovascular;  Laterality: N/A;   PERCUTANEOUS CORONARY STENT INTERVENTION (PCI-S) N/A 08/17/2013   Procedure: PERCUTANEOUS CORONARY STENT INTERVENTION (PCI-S);  Surgeon: Pamella Pert, MD;  Location: [facility] CATH LAB;  Service: Cardiovascular;  Laterality: N/A;   RIGHT HEART CATH AND CORONARY ANGIOGRAPHY N/A 08/30/2023   Procedure: RIGHT HEART CATH AND CORONARY ANGIOGRAPHY;  Surgeon: Orbie Pyo, MD;  Location: [facility] INVASIVE CV LAB;  Service: Cardiovascular;  Laterality: N/A;   TOTAL HIP ARTHROPLASTY Right 12/19/2017   Procedure: RIGHT TOTAL HIP ARTHROPLASTY ANTERIOR APPROACH;  Surgeon: Eldred Manges, MD;  Location: [facility] OR;  Service: Orthopedics;  Laterality: Right;   TOTAL HIP ARTHROPLASTY Left 12/26/2019   Procedure: TOTAL HIP ARTHROPLASTY-DIRECT ANTERIOR;  Surgeon: Eldred Manges, MD;  Location:  [facility] OR;  Service: Orthopedics;  Laterality: Left;   TUBAL LIGATION  1980    Social History   Tobacco Use  Smoking Status Former   Current packs/day: 0.00   Average packs/day: 1 pack/day for 52.0 years (52.0 ttl pk-yrs)   Types: Cigarettes   Start date: 04/04/1962   Quit date: 04/04/2014   Years since quitting: 9.4  Smokeless Tobacco Never    Social History   Substance and Sexual Activity  Alcohol Use No   Comment: 08/17/2013 "quit drinking at age 39; never did drink much"    Social History   Socioeconomic History   Marital status: Married    Spouse name: Not on file    Number of children: Not on file   Years of education: Not on file   Highest education level: Not on file  Occupational History   Not on file  Tobacco Use   Smoking status: Former    Current packs/day: 0.00    Average packs/day: 1 pack/day for 52.0 years (52.0 ttl pk-yrs)    Types: Cigarettes    Start date: 04/04/1962    Quit date: 04/04/2014    Years since quitting: 9.4   Smokeless tobacco: Never  Vaping Use   Vaping status: Never Used  Substance and Sexual Activity   Alcohol use: No    Comment: 08/17/2013 "quit drinking at age 82; never did drink much"   Drug use: No   Sexual activity: Never  Other Topics Concern   Not on file  Social History Narrative   Not on file   Social Drivers of Health   Financial Resource Strain: Not on file  Food Insecurity: No Food Insecurity (01/23/2020)   Hunger Vital Sign    Worried About Running Out of Food in the Last Year: Never true    Ran Out of Food in the Last Year: Never true  Transportation Needs: No Transportation Needs (01/23/2020)   PRAPARE - Administrator, Civil Service (Medical): No    Lack of Transportation (Non-Medical): No  Physical Activity: Not on file  Stress: Not on file  Social Connections: Not on file  Intimate Partner Violence: Not on file    Allergies  Allergen Reactions   Penicillins Rash    Has patient had a PCN reaction causing immediate rash, facial/tongue/throat swelling, SOB or lightheadedness with hypotension: No Has patient had a PCN reaction causing severe rash involving mucus membranes or skin necrosis: Unknown Has patient had a PCN reaction that required hospitalization: No Has patient had a PCN reaction occurring within the last 10 years: No If all of the above answers are "NO", then may proceed with Cephalosporin use.     Current Outpatient Medications  Medication Sig Dispense Refill   amLODipine (NORVASC) 2.5 MG tablet Take 2.5 mg by mouth daily.     aspirin 81 MG EC tablet Take 81 mg  by mouth daily.      cholecalciferol (VITAMIN D3) 25 MCG (1000 UNIT) tablet Take 1,000 Units by mouth daily.     ibuprofen (ADVIL) 200 MG tablet Take 600 mg by mouth every 8 (eight) hours as needed for moderate pain (pain score 4-6).     isosorbide mononitrate (IMDUR) 60 MG 24 hr tablet Take 1 tablet (60 mg total) by mouth daily. 30 tablet 1   LINZESS 72 MCG capsule Take 72 mcg by mouth daily as needed (constipation).     metoprolol succinate (TOPROL-XL) 50 MG 24 hr tablet Take 50 mg by mouth  daily. Take with or immediately following a meal.     Multiple Minerals-Vitamins (CALCIUM & VIT D3 BONE HEALTH PO) Take 1 tablet by mouth daily.     nitroGLYCERIN (NITROSTAT) 0.4 MG SL tablet Place 1 tablet (0.4 mg total) under the tongue every 5 (five) minutes as needed for chest pain. 30 tablet 1   omeprazole (PRILOSEC) 40 MG capsule Take 40 mg by mouth daily as needed (acid reflux).   1   rosuvastatin (CRESTOR) 20 MG tablet Take 20 mg by mouth daily.     spironolactone (ALDACTONE) 25 MG tablet Take 12.5 mg by mouth daily. Per patient taking 1/2 tablet (12.5) mg of 25 mg tablet     No current facility-administered medications for this visit.     Family History  Problem Relation Age of Onset   Cancer Brother    Colon cancer Brother    Cancer Brother    Heart attack Brother    Esophageal cancer Neg Hx    Stomach cancer Neg Hx    Rectal cancer Neg Hx        Physical Exam:      Diagnostic Studies & Laboratory data: I have personally reviewed the following studies and agree with the findings   TTE (08/2023) IMPRESSIONS     1. Left ventricular ejection fraction, by estimation, is 60 to 65%. The  left ventricle has normal function. The left ventricle has no regional  wall motion abnormalities. There is severe left ventricular hypertrophy.  Left ventricular diastolic parameters   are consistent with Grade I diastolic dysfunction (impaired relaxation).  Elevated left ventricular  end-diastolic pressure. The average left  ventricular global longitudinal strain is -20.4 %. The global longitudinal  strain is normal.   2. Right ventricular systolic function is normal. The right ventricular  size is normal.   3. Left atrial size was moderately dilated.   4. The mitral valve is abnormal. Trivial mitral valve regurgitation. No  evidence of mitral stenosis.   5. AS moderate by gradients and DVI but severe based on AVA 0.7cm2 using  VTI. Valve is severely calcified . The aortic valve is tricuspid. There is  severe calcifcation of the aortic valve. There is severe thickening of the  aortic valve. Aortic valve  regurgitation is trivial. Moderate to severe aortic valve stenosis.   6. The inferior vena cava is normal in size with greater than 50%  respiratory variability, suggesting right atrial pressure of 3 mmHg.   FINDINGS   Left Ventricle: Left ventricular ejection fraction, by estimation, is 60  to 65%. The left ventricle has normal function. The left ventricle has no  regional wall motion abnormalities. The average left ventricular global  longitudinal strain is -20.4 %.  The global longitudinal strain is normal. The left ventricular internal  cavity size was normal in size. There is severe left ventricular  hypertrophy. Left ventricular diastolic parameters are consistent with  Grade I diastolic dysfunction (impaired  relaxation). Elevated left ventricular end-diastolic pressure.   Right Ventricle: The right ventricular size is normal. No increase in  right ventricular wall thickness. Right ventricular systolic function is  normal.   Left Atrium: Left atrial size was moderately dilated.   Right Atrium: Right atrial size was normal in size.   Pericardium: There is no evidence of pericardial effusion.   Mitral Valve: The mitral valve is abnormal. There is mild thickening of  the mitral valve leaflet(s). Mild mitral annular calcification. Trivial  mitral valve  regurgitation. No evidence  of mitral valve stenosis. MV peak  gradient, 6.2 mmHg. The mean mitral  valve gradient is 2.0 mmHg.   Tricuspid Valve: The tricuspid valve is normal in structure. Tricuspid  valve regurgitation is mild . No evidence of tricuspid stenosis.   Aortic Valve: AS moderate by gradients and DVI but severe based on AVA  0.7cm2 using VTI. Valve is severely calcified. The aortic valve is  tricuspid. There is severe calcifcation of the aortic valve. There is  severe thickening of the aortic valve. Aortic  valve regurgitation is trivial. Moderate to severe aortic stenosis is  present. Aortic valve mean gradient measures 27.5 mmHg. Aortic valve peak  gradient measures 53.0 mmHg. Aortic valve area, by VTI measures 0.70 cm.   Pulmonic Valve: The pulmonic valve was normal in structure. Pulmonic valve  regurgitation is not visualized. No evidence of pulmonic stenosis.   Aorta: The aortic root is normal in size and structure.   Venous: The inferior vena cava is normal in size with greater than 50%  respiratory variability, suggesting right atrial pressure of 3 mmHg.   IAS/Shunts: No atrial level shunt detected by color flow Doppler.     LEFT VENTRICLE  PLAX 2D  LVIDd:         3.60 cm   Diastology  LVIDs:         2.30 cm   LV e' medial:    4.68 cm/s  LV PW:         1.30 cm   LV E/e' medial:  15.4  LV IVS:        1.60 cm   LV e' lateral:   4.57 cm/s  LVOT diam:     1.70 cm   LV E/e' lateral: 15.8  LV SV:         62  LV SV Index:   31        2D Longitudinal Strain  LVOT Area:     2.27 cm  2D Strain GLS Avg:     -20.4 %     RIGHT VENTRICLE  RV Basal diam:  3.30 cm  RV Mid diam:    2.70 cm  RV S prime:     9.36 cm/s  TAPSE (M-mode): 1.9 cm   LEFT ATRIUM             Index        RIGHT ATRIUM           Index  LA diam:        4.30 cm 2.18 cm/m   RA Area:     12.00 cm  LA Vol (A2C):   67.9 ml 34.47 ml/m  RA Volume:   24.90 ml  12.64 ml/m  LA Vol (A4C):   64.8 ml  32.90 ml/m  LA Biplane Vol: 68.8 ml 34.93 ml/m   AORTIC VALVE                     PULMONIC VALVE  AV Area (Vmax):    0.75 cm      PV Vmax:        0.81 m/s  AV Area (Vmean):   0.82 cm      PV Peak grad:   2.6 mmHg  AV Area (VTI):     0.70 cm      RVOT Peak grad: 1 mmHg  AV Vmax:           364.00 cm/s  AV Vmean:  244.500 cm/s  AV VTI:            0.883 m  AV Peak Grad:      53.0 mmHg  AV Mean Grad:      27.5 mmHg  LVOT Vmax:         120.00 cm/s  LVOT Vmean:        87.900 cm/s  LVOT VTI:          0.271 m  LVOT/AV VTI ratio: 0.31    AORTA  Ao Sinus diam: 3.00 cm  Ao STJ diam:   2.6 cm  Ao Asc diam:   3.30 cm   MITRAL VALVE                TRICUSPID VALVE  MV Area (PHT): 2.61 cm     TR Peak grad:   25.4 mmHg  MV Area VTI:   2.20 cm     TR Vmax:        252.00 cm/s  MV Peak grad:  6.2 mmHg  MV Mean grad:  2.0 mmHg     SHUNTS  MV Vmax:       1.24 m/s     Systemic VTI:  0.27 m  MV Vmean:      62.6 cm/s    Systemic Diam: 1.70 cm  MV Decel Time: 291 msec  MV E velocity: 72.30 cm/s  MV A velocity: 106.00 cm/s  MV E/A ratio:  0.68   CATH (08/2023)    Conclusion      Prox Cx to Mid Cx lesion is 10% stenosed.   2nd Mrg lesion is 80% stenosed.   Ost RCA to Mid RCA lesion is 10% stenosed.   Dist Cx lesion is 10% stenosed.   1.  Patent proximal to mid right coronary artery stents with minimal in-stent restenosis 2.  Widely patent proximal and distal left circumflex stents.  There is a high-grade lesion of the ostium of the first obtuse marginal that should be treated medically. 3.  Mild diffuse nonobstructive disease of the LAD. 4.  Fick cardiac output of 7.9 L/min and Fick cardiac index of 4.0 L/min/m with the following hemodynamics:            Right atrial pressure mean of 4 mmHg            Right ventricular pressure 44/7 with an end-diastolic pressure of 12 mmHg            PA pressure of 45/12 with a mean PA pressure of 28 mmHg            Wedge pressure mean of 11  mmHg with V waves to 16 mmHg.            PVR of 2.2 Woods units            PA pulsatility index of greater than 3 5.  Capacious iliofemoral vessels bilaterally.   Recent Radiology Findings:   CTA (09/2023) FINDINGS: Image quality: Excellent.   Noise artifact is: Limited.   Valve Morphology: Tricuspid aortic valve with diffuse severe calcifications. Severely restricted leaflet motion in systole.   Aortic Valve Calcium score: 1766   Aortic annular dimension:   Phase assessed: 30%   Annular area: 397 mm2   Annular perimeter: 71.8 mm   Max diameter: 25.0 mm   Min diameter: 19.9 mm   Annular and subannular calcification: None.   Membranous septum length: 6.5 mm   Optimal coplanar projection: LAO 20 CRA 7  Coronary Artery Height above Annulus:   Left Main: 13.3 mm   Right Coronary: 17.8 mm   Sinus of Valsalva Measurements:   Non-coronary: 30 mm   Right-coronary: 30 mm   Left-coronary: 29 mm   Sinus of Valsalva Height:   Non-coronary: 22.1 mm   Right-coronary: 23.7 mm   Left-coronary: 22.4 mm   Sinotubular Junction: 29 mm   Ascending Thoracic Aorta: 33 mm   Coronary Arteries: Normal coronary origin. Right dominance. The study was performed without use of NTG and is insufficient for plaque evaluation. Please refer to recent cardiac catheterization for coronary assessment. 3-vessel coronary calcifications.   Cardiac Morphology:   Right Atrium: Right atrial size is within normal limits.   Right Ventricle: The right ventricular cavity is within normal limits.   Left Atrium: Left atrial size is normal in size with no left atrial appendage filling defect. No LAA thrombus on delayed imaging.   Left Ventricle: The ventricular cavity size is within normal limits.   Pulmonary arteries: Normal in size without proximal filling defect.   Pulmonary veins: Normal pulmonary venous drainage.   Pericardium: Normal thickness with no significant effusion  or calcium present.   Mitral Valve: The mitral valve is normal structure without significant calcification.   Extra-cardiac findings: See attached radiology report for non-cardiac structures.   IMPRESSION: 1. Annular measurements support a 23 mm S3 or 26 mm Evolut Pro.   2. No significant annular or subannular calcifications.   3. Sufficient coronary to annulus distance.   4. Optimal Fluoroscopic Angle for Delivery: LAO 20 CRA 7      Recent Lab Findings: Lab Results  Component Value Date   WBC 6.2 08/22/2023   HGB 13.6 08/30/2023   HCT 40.0 08/30/2023   PLT 154 08/22/2023   GLUCOSE 98 08/22/2023   CHOL 167 12/29/2015   TRIG 81 12/29/2015   HDL 50 12/29/2015   LDLCALC 101 (H) 12/29/2015   ALT 11 12/19/2019   AST 17 12/19/2019   NA 123 (L) 08/30/2023   K 3.1 (L) 08/30/2023   CL 108 (H) 08/22/2023   CREATININE 0.80 08/22/2023   BUN 16 08/22/2023   CO2 21 08/22/2023   TSH 0.862 12/29/2015   INR 1.05 10/16/2014      Assessment / Plan:     83 yo female with NYHA class 2 symptoms of severe low flow low gradient AS and with normal LV function and patent coronary stents. I agree that she would benefit from AVR and with her age would favor TAVR. She would do well with a medtronic valve size 26 evolute however could discuss the oversized 29mm if felt acceptable by team at time of implant. All the risks and goals and recover from the procedure were discussed and she is willing to proceed and is a bail out candidate   I have spent 60 min in review of the records, viewing studies and in face to face with patient and in coordination of future care    Eugenio Hoes 09/28/2023 1:16 PM

## 2023-09-28 NOTE — H&P (View-Only) (Signed)
 [address]       [place] [address]             [phone]           TREESA MCCULLY [facility] Medical Record #[id number] Date of Birth: 1941-02-28  Rinaldo Cloud, MD Estevan Oaks, NP  Chief Complaint:   increasing DOE and fatigue  History of Present Illness:     Pt is an 83 yo female who has been having increasing DOE and fatigue. She also reports having "spells" that would make it momentarily feel like she was "going out". She has not had these spells since she has had catheterization. She had an echo that has shown normal EF of 60% and what was felt to be paradoxical low flow low gradient severe AS with a mean gradient of and a DVI of 0.31 and a calculated valve area of 0.7 cms; She was evaluated by cardiology and felt to require AVR and underwent cath without significant CAD with widely patent previous stents. She underwent TAVR CTA which has shown acceptable anatomy for femoral access and either sapien or medtronic valves. She has a IRBBB and would benefit from internal jugular pacing. She feels she is quite active individual      Past Medical History:  Diagnosis Date   Anemia    in younger years   Arthritis    "hands; legs" (08/17/2013)   CAP (community acquired pneumonia)    Coronary artery disease    6 stents in 2015   GERD (gastroesophageal reflux disease)    H/O hiatal hernia    High cholesterol    Hx of adenomatous colonic polyps 08/15/2018   Hypertension    Radiation 11/28/14-01/02/15   Right breast 50 Gray   Severe aortic stenosis    Vertigo     Past Surgical History:  Procedure Laterality Date   ABDOMINAL AORTOGRAM N/A 08/30/2023   Procedure: ABDOMINAL AORTOGRAM;  Surgeon: Orbie Pyo, MD;  Location: [facility] INVASIVE CV LAB;  Service: Cardiovascular;  Laterality: N/A;   ABDOMINAL HYSTERECTOMY  1985   APPENDECTOMY  1960   BREAST LUMPECTOMY WITH NEEDLE LOCALIZATION AND AXILLARY SENTINEL LYMPH NODE BX Right 10/17/2014    Procedure: BREAST LUMPECTOMY WITH NEEDLE LOCALIZATION AND AXILLARY SENTINEL LYMPH NODE BX;  Surgeon: Avel Peace, MD;  Location: [facility] OR;  Service: General;  Laterality: Right;   CARDIAC CATHETERIZATION  08/07/2013   COLONOSCOPY     CORONARY ANGIOPLASTY WITH STENT PLACEMENT  08/17/2013   "6 stents"(08/17/2013)   LEFT AND RIGHT HEART CATHETERIZATION WITH CORONARY ANGIOGRAM N/A 08/07/2013   Procedure: LEFT AND RIGHT HEART CATHETERIZATION WITH CORONARY ANGIOGRAM;  Surgeon: Pamella Pert, MD;  Location: [facility] CATH LAB;  Service: Cardiovascular;  Laterality: N/A;   PERCUTANEOUS CORONARY STENT INTERVENTION (PCI-S) N/A 08/17/2013   Procedure: PERCUTANEOUS CORONARY STENT INTERVENTION (PCI-S);  Surgeon: Pamella Pert, MD;  Location: [facility] CATH LAB;  Service: Cardiovascular;  Laterality: N/A;   RIGHT HEART CATH AND CORONARY ANGIOGRAPHY N/A 08/30/2023   Procedure: RIGHT HEART CATH AND CORONARY ANGIOGRAPHY;  Surgeon: Orbie Pyo, MD;  Location: [facility] INVASIVE CV LAB;  Service: Cardiovascular;  Laterality: N/A;   TOTAL HIP ARTHROPLASTY Right 12/19/2017   Procedure: RIGHT TOTAL HIP ARTHROPLASTY ANTERIOR APPROACH;  Surgeon: Eldred Manges, MD;  Location: [facility] OR;  Service: Orthopedics;  Laterality: Right;   TOTAL HIP ARTHROPLASTY Left 12/26/2019   Procedure: TOTAL HIP ARTHROPLASTY-DIRECT ANTERIOR;  Surgeon: Eldred Manges, MD;  Location:  [facility] OR;  Service: Orthopedics;  Laterality: Left;   TUBAL LIGATION  1980    Social History   Tobacco Use  Smoking Status Former   Current packs/day: 0.00   Average packs/day: 1 pack/day for 52.0 years (52.0 ttl pk-yrs)   Types: Cigarettes   Start date: 04/04/1962   Quit date: 04/04/2014   Years since quitting: 9.4  Smokeless Tobacco Never    Social History   Substance and Sexual Activity  Alcohol Use No   Comment: 08/17/2013 "quit drinking at age 60; never did drink much"    Social History   Socioeconomic History   Marital status: Married    Spouse name: Not on file    Number of children: Not on file   Years of education: Not on file   Highest education level: Not on file  Occupational History   Not on file  Tobacco Use   Smoking status: Former    Current packs/day: 0.00    Average packs/day: 1 pack/day for 52.0 years (52.0 ttl pk-yrs)    Types: Cigarettes    Start date: 04/04/1962    Quit date: 04/04/2014    Years since quitting: 9.4   Smokeless tobacco: Never  Vaping Use   Vaping status: Never Used  Substance and Sexual Activity   Alcohol use: No    Comment: 08/17/2013 "quit drinking at age 67; never did drink much"   Drug use: No   Sexual activity: Never  Other Topics Concern   Not on file  Social History Narrative   Not on file   Social Drivers of Health   Financial Resource Strain: Not on file  Food Insecurity: No Food Insecurity (01/23/2020)   Hunger Vital Sign    Worried About Running Out of Food in the Last Year: Never true    Ran Out of Food in the Last Year: Never true  Transportation Needs: No Transportation Needs (01/23/2020)   PRAPARE - Administrator, Civil Service (Medical): No    Lack of Transportation (Non-Medical): No  Physical Activity: Not on file  Stress: Not on file  Social Connections: Not on file  Intimate Partner Violence: Not on file    Allergies  Allergen Reactions   Penicillins Rash    Has patient had a PCN reaction causing immediate rash, facial/tongue/throat swelling, SOB or lightheadedness with hypotension: No Has patient had a PCN reaction causing severe rash involving mucus membranes or skin necrosis: Unknown Has patient had a PCN reaction that required hospitalization: No Has patient had a PCN reaction occurring within the last 10 years: No If all of the above answers are "NO", then may proceed with Cephalosporin use.     Current Outpatient Medications  Medication Sig Dispense Refill   amLODipine (NORVASC) 2.5 MG tablet Take 2.5 mg by mouth daily.     aspirin 81 MG EC tablet Take 81 mg  by mouth daily.      cholecalciferol (VITAMIN D3) 25 MCG (1000 UNIT) tablet Take 1,000 Units by mouth daily.     ibuprofen (ADVIL) 200 MG tablet Take 600 mg by mouth every 8 (eight) hours as needed for moderate pain (pain score 4-6).     isosorbide mononitrate (IMDUR) 60 MG 24 hr tablet Take 1 tablet (60 mg total) by mouth daily. 30 tablet 1   LINZESS 72 MCG capsule Take 72 mcg by mouth daily as needed (constipation).     metoprolol succinate (TOPROL-XL) 50 MG 24 hr tablet Take 50 mg by mouth  daily. Take with or immediately following a meal.     Multiple Minerals-Vitamins (CALCIUM & VIT D3 BONE HEALTH PO) Take 1 tablet by mouth daily.     nitroGLYCERIN (NITROSTAT) 0.4 MG SL tablet Place 1 tablet (0.4 mg total) under the tongue every 5 (five) minutes as needed for chest pain. 30 tablet 1   omeprazole (PRILOSEC) 40 MG capsule Take 40 mg by mouth daily as needed (acid reflux).   1   rosuvastatin (CRESTOR) 20 MG tablet Take 20 mg by mouth daily.     spironolactone (ALDACTONE) 25 MG tablet Take 12.5 mg by mouth daily. Per patient taking 1/2 tablet (12.5) mg of 25 mg tablet     No current facility-administered medications for this visit.     Family History  Problem Relation Age of Onset   Cancer Brother    Colon cancer Brother    Cancer Brother    Heart attack Brother    Esophageal cancer Neg Hx    Stomach cancer Neg Hx    Rectal cancer Neg Hx        Physical Exam:      Diagnostic Studies & Laboratory data: I have personally reviewed the following studies and agree with the findings   TTE (08/2023) IMPRESSIONS     1. Left ventricular ejection fraction, by estimation, is 60 to 65%. The  left ventricle has normal function. The left ventricle has no regional  wall motion abnormalities. There is severe left ventricular hypertrophy.  Left ventricular diastolic parameters   are consistent with Grade I diastolic dysfunction (impaired relaxation).  Elevated left ventricular  end-diastolic pressure. The average left  ventricular global longitudinal strain is -20.4 %. The global longitudinal  strain is normal.   2. Right ventricular systolic function is normal. The right ventricular  size is normal.   3. Left atrial size was moderately dilated.   4. The mitral valve is abnormal. Trivial mitral valve regurgitation. No  evidence of mitral stenosis.   5. AS moderate by gradients and DVI but severe based on AVA 0.7cm2 using  VTI. Valve is severely calcified . The aortic valve is tricuspid. There is  severe calcifcation of the aortic valve. There is severe thickening of the  aortic valve. Aortic valve  regurgitation is trivial. Moderate to severe aortic valve stenosis.   6. The inferior vena cava is normal in size with greater than 50%  respiratory variability, suggesting right atrial pressure of 3 mmHg.   FINDINGS   Left Ventricle: Left ventricular ejection fraction, by estimation, is 60  to 65%. The left ventricle has normal function. The left ventricle has no  regional wall motion abnormalities. The average left ventricular global  longitudinal strain is -20.4 %.  The global longitudinal strain is normal. The left ventricular internal  cavity size was normal in size. There is severe left ventricular  hypertrophy. Left ventricular diastolic parameters are consistent with  Grade I diastolic dysfunction (impaired  relaxation). Elevated left ventricular end-diastolic pressure.   Right Ventricle: The right ventricular size is normal. No increase in  right ventricular wall thickness. Right ventricular systolic function is  normal.   Left Atrium: Left atrial size was moderately dilated.   Right Atrium: Right atrial size was normal in size.   Pericardium: There is no evidence of pericardial effusion.   Mitral Valve: The mitral valve is abnormal. There is mild thickening of  the mitral valve leaflet(s). Mild mitral annular calcification. Trivial  mitral valve  regurgitation. No evidence  of mitral valve stenosis. MV peak  gradient, 6.2 mmHg. The mean mitral  valve gradient is 2.0 mmHg.   Tricuspid Valve: The tricuspid valve is normal in structure. Tricuspid  valve regurgitation is mild . No evidence of tricuspid stenosis.   Aortic Valve: AS moderate by gradients and DVI but severe based on AVA  0.7cm2 using VTI. Valve is severely calcified. The aortic valve is  tricuspid. There is severe calcifcation of the aortic valve. There is  severe thickening of the aortic valve. Aortic  valve regurgitation is trivial. Moderate to severe aortic stenosis is  present. Aortic valve mean gradient measures 27.5 mmHg. Aortic valve peak  gradient measures 53.0 mmHg. Aortic valve area, by VTI measures 0.70 cm.   Pulmonic Valve: The pulmonic valve was normal in structure. Pulmonic valve  regurgitation is not visualized. No evidence of pulmonic stenosis.   Aorta: The aortic root is normal in size and structure.   Venous: The inferior vena cava is normal in size with greater than 50%  respiratory variability, suggesting right atrial pressure of 3 mmHg.   IAS/Shunts: No atrial level shunt detected by color flow Doppler.     LEFT VENTRICLE  PLAX 2D  LVIDd:         3.60 cm   Diastology  LVIDs:         2.30 cm   LV e' medial:    4.68 cm/s  LV PW:         1.30 cm   LV E/e' medial:  15.4  LV IVS:        1.60 cm   LV e' lateral:   4.57 cm/s  LVOT diam:     1.70 cm   LV E/e' lateral: 15.8  LV SV:         62  LV SV Index:   31        2D Longitudinal Strain  LVOT Area:     2.27 cm  2D Strain GLS Avg:     -20.4 %     RIGHT VENTRICLE  RV Basal diam:  3.30 cm  RV Mid diam:    2.70 cm  RV S prime:     9.36 cm/s  TAPSE (M-mode): 1.9 cm   LEFT ATRIUM             Index        RIGHT ATRIUM           Index  LA diam:        4.30 cm 2.18 cm/m   RA Area:     12.00 cm  LA Vol (A2C):   67.9 ml 34.47 ml/m  RA Volume:   24.90 ml  12.64 ml/m  LA Vol (A4C):   64.8 ml  32.90 ml/m  LA Biplane Vol: 68.8 ml 34.93 ml/m   AORTIC VALVE                     PULMONIC VALVE  AV Area (Vmax):    0.75 cm      PV Vmax:        0.81 m/s  AV Area (Vmean):   0.82 cm      PV Peak grad:   2.6 mmHg  AV Area (VTI):     0.70 cm      RVOT Peak grad: 1 mmHg  AV Vmax:           364.00 cm/s  AV Vmean:  244.500 cm/s  AV VTI:            0.883 m  AV Peak Grad:      53.0 mmHg  AV Mean Grad:      27.5 mmHg  LVOT Vmax:         120.00 cm/s  LVOT Vmean:        87.900 cm/s  LVOT VTI:          0.271 m  LVOT/AV VTI ratio: 0.31    AORTA  Ao Sinus diam: 3.00 cm  Ao STJ diam:   2.6 cm  Ao Asc diam:   3.30 cm   MITRAL VALVE                TRICUSPID VALVE  MV Area (PHT): 2.61 cm     TR Peak grad:   25.4 mmHg  MV Area VTI:   2.20 cm     TR Vmax:        252.00 cm/s  MV Peak grad:  6.2 mmHg  MV Mean grad:  2.0 mmHg     SHUNTS  MV Vmax:       1.24 m/s     Systemic VTI:  0.27 m  MV Vmean:      62.6 cm/s    Systemic Diam: 1.70 cm  MV Decel Time: 291 msec  MV E velocity: 72.30 cm/s  MV A velocity: 106.00 cm/s  MV E/A ratio:  0.68   CATH (08/2023)    Conclusion      Prox Cx to Mid Cx lesion is 10% stenosed.   2nd Mrg lesion is 80% stenosed.   Ost RCA to Mid RCA lesion is 10% stenosed.   Dist Cx lesion is 10% stenosed.   1.  Patent proximal to mid right coronary artery stents with minimal in-stent restenosis 2.  Widely patent proximal and distal left circumflex stents.  There is a high-grade lesion of the ostium of the first obtuse marginal that should be treated medically. 3.  Mild diffuse nonobstructive disease of the LAD. 4.  Fick cardiac output of 7.9 L/min and Fick cardiac index of 4.0 L/min/m with the following hemodynamics:            Right atrial pressure mean of 4 mmHg            Right ventricular pressure 44/7 with an end-diastolic pressure of 12 mmHg            PA pressure of 45/12 with a mean PA pressure of 28 mmHg            Wedge pressure mean of 11  mmHg with V waves to 16 mmHg.            PVR of 2.2 Woods units            PA pulsatility index of greater than 3 5.  Capacious iliofemoral vessels bilaterally.   Recent Radiology Findings:   CTA (09/2023) FINDINGS: Image quality: Excellent.   Noise artifact is: Limited.   Valve Morphology: Tricuspid aortic valve with diffuse severe calcifications. Severely restricted leaflet motion in systole.   Aortic Valve Calcium score: 1766   Aortic annular dimension:   Phase assessed: 30%   Annular area: 397 mm2   Annular perimeter: 71.8 mm   Max diameter: 25.0 mm   Min diameter: 19.9 mm   Annular and subannular calcification: None.   Membranous septum length: 6.5 mm   Optimal coplanar projection: LAO 20 CRA 7  Coronary Artery Height above Annulus:   Left Main: 13.3 mm   Right Coronary: 17.8 mm   Sinus of Valsalva Measurements:   Non-coronary: 30 mm   Right-coronary: 30 mm   Left-coronary: 29 mm   Sinus of Valsalva Height:   Non-coronary: 22.1 mm   Right-coronary: 23.7 mm   Left-coronary: 22.4 mm   Sinotubular Junction: 29 mm   Ascending Thoracic Aorta: 33 mm   Coronary Arteries: Normal coronary origin. Right dominance. The study was performed without use of NTG and is insufficient for plaque evaluation. Please refer to recent cardiac catheterization for coronary assessment. 3-vessel coronary calcifications.   Cardiac Morphology:   Right Atrium: Right atrial size is within normal limits.   Right Ventricle: The right ventricular cavity is within normal limits.   Left Atrium: Left atrial size is normal in size with no left atrial appendage filling defect. No LAA thrombus on delayed imaging.   Left Ventricle: The ventricular cavity size is within normal limits.   Pulmonary arteries: Normal in size without proximal filling defect.   Pulmonary veins: Normal pulmonary venous drainage.   Pericardium: Normal thickness with no significant effusion  or calcium present.   Mitral Valve: The mitral valve is normal structure without significant calcification.   Extra-cardiac findings: See attached radiology report for non-cardiac structures.   IMPRESSION: 1. Annular measurements support a 23 mm S3 or 26 mm Evolut Pro.   2. No significant annular or subannular calcifications.   3. Sufficient coronary to annulus distance.   4. Optimal Fluoroscopic Angle for Delivery: LAO 20 CRA 7      Recent Lab Findings: Lab Results  Component Value Date   WBC 6.2 08/22/2023   HGB 13.6 08/30/2023   HCT 40.0 08/30/2023   PLT 154 08/22/2023   GLUCOSE 98 08/22/2023   CHOL 167 12/29/2015   TRIG 81 12/29/2015   HDL 50 12/29/2015   LDLCALC 101 (H) 12/29/2015   ALT 11 12/19/2019   AST 17 12/19/2019   NA 123 (L) 08/30/2023   K 3.1 (L) 08/30/2023   CL 108 (H) 08/22/2023   CREATININE 0.80 08/22/2023   BUN 16 08/22/2023   CO2 21 08/22/2023   TSH 0.862 12/29/2015   INR 1.05 10/16/2014      Assessment / Plan:     83 yo female with NYHA class 2 symptoms of severe low flow low gradient AS and with normal LV function and patent coronary stents. I agree that she would benefit from AVR and with her age would favor TAVR. She would do well with a medtronic valve size 26 evolute however could discuss the oversized 29mm if felt acceptable by team at time of implant. All the risks and goals and recover from the procedure were discussed and she is willing to proceed and is a bail out candidate   I have spent 60 min in review of the records, viewing studies and in face to face with patient and in coordination of future care    Eugenio Hoes 09/28/2023 1:16 PM

## 2023-09-29 ENCOUNTER — Encounter: Payer: Self-pay | Admitting: Thoracic Surgery (Cardiothoracic Vascular Surgery)

## 2023-09-29 ENCOUNTER — Institutional Professional Consult (permissible substitution) (INDEPENDENT_AMBULATORY_CARE_PROVIDER_SITE_OTHER): Payer: Medicare HMO | Admitting: Thoracic Surgery (Cardiothoracic Vascular Surgery)

## 2023-09-29 VITALS — BP 115/66 | HR 72 | Resp 20 | Ht 65.0 in | Wt 202.0 lb

## 2023-09-29 DIAGNOSIS — I350 Nonrheumatic aortic (valve) stenosis: Secondary | ICD-10-CM

## 2023-09-29 NOTE — Patient Instructions (Signed)
 TAVR

## 2023-10-04 ENCOUNTER — Other Ambulatory Visit: Payer: Self-pay

## 2023-10-04 DIAGNOSIS — I350 Nonrheumatic aortic (valve) stenosis: Secondary | ICD-10-CM

## 2023-10-07 ENCOUNTER — Ambulatory Visit (HOSPITAL_COMMUNITY)
Admission: RE | Admit: 2023-10-07 | Discharge: 2023-10-07 | Disposition: A | Discharge status: Home / Self Care | Source: Ambulatory Visit | Attending: Cardiovascular Disease | Admitting: Cardiovascular Disease

## 2023-10-07 ENCOUNTER — Other Ambulatory Visit: Payer: Self-pay

## 2023-10-07 ENCOUNTER — Encounter (HOSPITAL_COMMUNITY)
Admission: RE | Admit: 2023-10-07 | Discharge: 2023-10-07 | Disposition: A | Discharge status: Home / Self Care | Source: Ambulatory Visit | Attending: Cardiovascular Disease | Admitting: Cardiovascular Disease

## 2023-10-07 DIAGNOSIS — I350 Nonrheumatic aortic (valve) stenosis: Secondary | ICD-10-CM

## 2023-10-07 DIAGNOSIS — Z01818 Encounter for other preprocedural examination: Secondary | ICD-10-CM | POA: Diagnosis not present

## 2023-10-07 DIAGNOSIS — Z01812 Encounter for preprocedural laboratory examination: Secondary | ICD-10-CM | POA: Diagnosis present

## 2023-10-07 DIAGNOSIS — Z01810 Encounter for preprocedural cardiovascular examination: Secondary | ICD-10-CM | POA: Diagnosis present

## 2023-10-07 DIAGNOSIS — R9431 Abnormal electrocardiogram [ECG] [EKG]: Secondary | ICD-10-CM | POA: Diagnosis not present

## 2023-10-07 DIAGNOSIS — Z01811 Encounter for preprocedural respiratory examination: Secondary | ICD-10-CM | POA: Diagnosis present

## 2023-10-07 LAB — CBC
HCT: 40.5 % (ref 36.0–46.0)
Hemoglobin: 14 g/dL (ref 12.0–15.0)
MCH: 27.9 pg (ref 26.0–34.0)
MCHC: 34.6 g/dL (ref 30.0–36.0)
MCV: 80.8 fL (ref 80.0–100.0)
Platelets: 162 10*3/uL (ref 150–400)
RBC: 5.01 MIL/uL (ref 3.87–5.11)
RDW: 15.4 % (ref 11.5–15.5)
WBC: 5.7 10*3/uL (ref 4.0–10.5)
nRBC: 0 % (ref 0.0–0.2)

## 2023-10-07 LAB — COMPREHENSIVE METABOLIC PANEL WITH GFR
ALT: 11 U/L (ref 0–44)
AST: 18 U/L (ref 15–41)
Albumin: 3.4 g/dL — ABNORMAL LOW (ref 3.5–5.0)
Alkaline Phosphatase: 35 U/L — ABNORMAL LOW (ref 38–126)
Anion gap: 8 (ref 5–15)
BUN: 14 mg/dL (ref 8–23)
CO2: 28 mmol/L (ref 22–32)
Calcium: 9.4 mg/dL (ref 8.9–10.3)
Chloride: 104 mmol/L (ref 98–111)
Creatinine, Ser: 0.78 mg/dL (ref 0.44–1.00)
GFR, Estimated: >60 mL/min (ref >60)
Glucose, Bld: 109 mg/dL — ABNORMAL HIGH (ref 70–99)
Potassium: 4.5 mmol/L (ref 3.5–5.1)
Sodium: 140 mmol/L (ref 135–145)
Total Bilirubin: 1 mg/dL (ref 0.0–1.2)
Total Protein: 6.8 g/dL (ref 6.5–8.1)

## 2023-10-07 LAB — URINALYSIS, ROUTINE W REFLEX MICROSCOPIC
Bilirubin Urine: NEGATIVE
Glucose, UA: NEGATIVE mg/dL
Hgb urine dipstick: NEGATIVE
Ketones, ur: NEGATIVE mg/dL
Leukocytes,Ua: NEGATIVE
Nitrite: NEGATIVE
Protein, ur: NEGATIVE mg/dL
Specific Gravity, Urine: 1.025 (ref 1.005–1.030)
pH: 6 (ref 5.0–8.0)

## 2023-10-07 LAB — PROTIME-INR
INR: 1.1 (ref 0.8–1.2)
Prothrombin Time: 14.5 s (ref 11.4–15.2)

## 2023-10-07 LAB — TYPE AND SCREEN
ABO/RH(D): A POS
Antibody Screen: NEGATIVE

## 2023-10-07 LAB — SURGICAL PCR SCREEN
MRSA, PCR: NEGATIVE
Staphylococcus aureus: NEGATIVE

## 2023-10-07 NOTE — Progress Notes (Signed)
 Patient signed all consents at PAT lab appointment. CHG soap and instructions were given to patient. CHG surgical prep reviewed with patient and all questions answered.  Patients chart send to anesthesia for review. Pt denies any respiratory illness/infection in the last two months.

## 2023-10-10 ENCOUNTER — Other Ambulatory Visit: Payer: Self-pay

## 2023-10-10 DIAGNOSIS — I350 Nonrheumatic aortic (valve) stenosis: Secondary | ICD-10-CM

## 2023-10-10 MED ORDER — MAGNESIUM SULFATE 50 % IJ SOLN
40.0000 meq | INTRAMUSCULAR | Status: DC
  Filled 2023-10-10 (×2): qty 9.85

## 2023-10-10 MED ORDER — HEPARIN 30,000 UNITS/1000 ML (OHS) CELLSAVER SOLUTION
Status: DC
  Filled 2023-10-10 (×2): qty 1000

## 2023-10-10 MED ORDER — DEXMEDETOMIDINE HCL IN NACL 400 MCG/100ML IV SOLN
0.1000 ug/kg/h | INTRAVENOUS | Status: AC
  Administered 2023-10-11: 1 ug/kg/h via INTRAVENOUS
  Filled 2023-10-10: qty 100

## 2023-10-10 MED ORDER — POTASSIUM CHLORIDE 2 MEQ/ML IV SOLN
80.0000 meq | INTRAVENOUS | Status: DC
  Filled 2023-10-10 (×2): qty 40

## 2023-10-10 MED ORDER — CEFAZOLIN SODIUM-DEXTROSE 2-4 GM/100ML-% IV SOLN
2.0000 g | INTRAVENOUS | Status: DC
  Filled 2023-10-10: qty 100

## 2023-10-10 MED ORDER — NOREPINEPHRINE 4 MG/250ML-% IV SOLN
0.0000 ug/min | INTRAVENOUS | Status: AC
  Administered 2023-10-11: 2 ug/min via INTRAVENOUS
  Filled 2023-10-10: qty 250

## 2023-10-11 ENCOUNTER — Inpatient Hospital Stay (HOSPITAL_COMMUNITY)

## 2023-10-11 ENCOUNTER — Inpatient Hospital Stay (HOSPITAL_COMMUNITY)
Admission: RE | Admit: 2023-10-11 | Discharge: 2023-10-14 | DRG: 267 | Disposition: A | Discharge status: Home / Self Care | Attending: Cardiovascular Disease | Admitting: Cardiovascular Disease

## 2023-10-11 ENCOUNTER — Other Ambulatory Visit: Payer: Self-pay | Admitting: Physician Assistant

## 2023-10-11 ENCOUNTER — Encounter (HOSPITAL_COMMUNITY): Payer: Self-pay | Admitting: Cardiovascular Disease

## 2023-10-11 ENCOUNTER — Inpatient Hospital Stay (HOSPITAL_COMMUNITY): Admitting: Certified Registered Nurse Anesthetist

## 2023-10-11 ENCOUNTER — Encounter (HOSPITAL_COMMUNITY)
Admission: RE | Disposition: A | Discharge status: Home / Self Care | Payer: Self-pay | Source: Home / Self Care | Attending: Cardiovascular Disease

## 2023-10-11 DIAGNOSIS — I2510 Atherosclerotic heart disease of native coronary artery without angina pectoris: Secondary | ICD-10-CM

## 2023-10-11 DIAGNOSIS — Z7982 Long term (current) use of aspirin: Secondary | ICD-10-CM

## 2023-10-11 DIAGNOSIS — Z006 Encounter for examination for normal comparison and control in clinical research program: Secondary | ICD-10-CM | POA: Diagnosis not present

## 2023-10-11 DIAGNOSIS — D696 Thrombocytopenia, unspecified: Secondary | ICD-10-CM | POA: Diagnosis present

## 2023-10-11 DIAGNOSIS — Z880 Allergy status to penicillin: Secondary | ICD-10-CM | POA: Diagnosis not present

## 2023-10-11 DIAGNOSIS — K219 Gastro-esophageal reflux disease without esophagitis: Secondary | ICD-10-CM | POA: Diagnosis present

## 2023-10-11 DIAGNOSIS — I10 Essential (primary) hypertension: Secondary | ICD-10-CM | POA: Diagnosis not present

## 2023-10-11 DIAGNOSIS — Z79899 Other long term (current) drug therapy: Secondary | ICD-10-CM | POA: Diagnosis not present

## 2023-10-11 DIAGNOSIS — Z90710 Acquired absence of both cervix and uterus: Secondary | ICD-10-CM

## 2023-10-11 DIAGNOSIS — I442 Atrioventricular block, complete: Secondary | ICD-10-CM | POA: Diagnosis present

## 2023-10-11 DIAGNOSIS — Z96643 Presence of artificial hip joint, bilateral: Secondary | ICD-10-CM | POA: Diagnosis present

## 2023-10-11 DIAGNOSIS — Z923 Personal history of irradiation: Secondary | ICD-10-CM

## 2023-10-11 DIAGNOSIS — I4719 Other supraventricular tachycardia: Secondary | ICD-10-CM | POA: Diagnosis present

## 2023-10-11 DIAGNOSIS — I129 Hypertensive chronic kidney disease with stage 1 through stage 4 chronic kidney disease, or unspecified chronic kidney disease: Secondary | ICD-10-CM | POA: Diagnosis present

## 2023-10-11 DIAGNOSIS — Z952 Presence of prosthetic heart valve: Secondary | ICD-10-CM

## 2023-10-11 DIAGNOSIS — Z853 Personal history of malignant neoplasm of breast: Secondary | ICD-10-CM

## 2023-10-11 DIAGNOSIS — Z87891 Personal history of nicotine dependence: Secondary | ICD-10-CM

## 2023-10-11 DIAGNOSIS — Z800 Family history of malignant neoplasm of digestive organs: Secondary | ICD-10-CM

## 2023-10-11 DIAGNOSIS — Z955 Presence of coronary angioplasty implant and graft: Secondary | ICD-10-CM | POA: Diagnosis not present

## 2023-10-11 DIAGNOSIS — I3139 Other pericardial effusion (noninflammatory): Secondary | ICD-10-CM | POA: Diagnosis present

## 2023-10-11 DIAGNOSIS — M4807 Spinal stenosis, lumbosacral region: Secondary | ICD-10-CM | POA: Diagnosis present

## 2023-10-11 DIAGNOSIS — N1831 Chronic kidney disease, stage 3a: Secondary | ICD-10-CM | POA: Diagnosis present

## 2023-10-11 DIAGNOSIS — Z8249 Family history of ischemic heart disease and other diseases of the circulatory system: Secondary | ICD-10-CM

## 2023-10-11 DIAGNOSIS — E7800 Pure hypercholesterolemia, unspecified: Secondary | ICD-10-CM | POA: Diagnosis present

## 2023-10-11 DIAGNOSIS — I350 Nonrheumatic aortic (valve) stenosis: Secondary | ICD-10-CM

## 2023-10-11 DIAGNOSIS — I959 Hypotension, unspecified: Secondary | ICD-10-CM | POA: Diagnosis not present

## 2023-10-11 DIAGNOSIS — C50411 Malignant neoplasm of upper-outer quadrant of right female breast: Secondary | ICD-10-CM | POA: Diagnosis present

## 2023-10-11 DIAGNOSIS — R911 Solitary pulmonary nodule: Secondary | ICD-10-CM | POA: Diagnosis present

## 2023-10-11 DIAGNOSIS — R079 Chest pain, unspecified: Secondary | ICD-10-CM | POA: Diagnosis not present

## 2023-10-11 HISTORY — DX: Presence of prosthetic heart valve: Z95.2

## 2023-10-11 LAB — POCT I-STAT, CHEM 8
BUN: 10 mg/dL (ref 8–23)
Calcium, Ion: 1.25 mmol/L (ref 1.15–1.40)
Chloride: 107 mmol/L (ref 98–111)
Creatinine, Ser: 0.6 mg/dL (ref 0.44–1.00)
Glucose, Bld: 139 mg/dL — ABNORMAL HIGH (ref 70–99)
HCT: 44 % (ref 36.0–46.0)
Hemoglobin: 15 g/dL (ref 12.0–15.0)
Potassium: 3.8 mmol/L (ref 3.5–5.1)
Sodium: 140 mmol/L (ref 135–145)
TCO2: 23 mmol/L (ref 22–32)

## 2023-10-11 LAB — ABO/RH: ABO/RH(D): A POS

## 2023-10-11 LAB — ECHOCARDIOGRAM LIMITED
AR max vel: 2.39 cm2
AV Area VTI: 2.63 cm2
AV Area mean vel: 2.24 cm2
AV Mean grad: 3 mmHg
AV Peak grad: 5.3 mmHg
Ao pk vel: 1.16 m/s

## 2023-10-11 SURGERY — TRANSCATHETER AORTIC VALVE REPLACEMENT, TRANSFEMORAL (CATHLAB)
Anesthesia: Monitor Anesthesia Care | Laterality: Right

## 2023-10-11 MED ORDER — PHENYLEPHRINE HCL (PRESSORS) 10 MG/ML IV SOLN
INTRAVENOUS | Status: DC | PRN
  Administered 2023-10-11: 240 ug via INTRAVENOUS

## 2023-10-11 MED ORDER — HEPARIN SODIUM (PORCINE) 1000 UNIT/ML IJ SOLN
INTRAMUSCULAR | Status: DC | PRN
  Administered 2023-10-11: 14000 [IU] via INTRAVENOUS

## 2023-10-11 MED ORDER — HEPARIN (PORCINE) IN NACL 1000-0.9 UT/500ML-% IV SOLN
INTRAVENOUS | Status: DC | PRN
  Administered 2023-10-11 (×2): 500 mL

## 2023-10-11 MED ORDER — MIDAZOLAM HCL 2 MG/2ML IJ SOLN
INTRAMUSCULAR | Status: DC | PRN
Start: 2023-10-11 — End: 2023-10-11
  Administered 2023-10-11 (×2): 1 mg via INTRAVENOUS

## 2023-10-11 MED ORDER — ASPIRIN 81 MG PO TBEC
81.0000 mg | DELAYED_RELEASE_TABLET | Freq: Every day | ORAL | Status: DC
  Administered 2023-10-12 – 2023-10-14 (×3): 81 mg via ORAL
  Filled 2023-10-11 (×3): qty 1

## 2023-10-11 MED ORDER — SODIUM CHLORIDE 0.9 % IV SOLN: INTRAVENOUS | Status: AC

## 2023-10-11 MED ORDER — NOREPINEPHRINE BITARTRATE 1 MG/ML IV SOLN
INTRAVENOUS | Status: DC | PRN
  Administered 2023-10-11 (×2): 1 mL via INTRAVENOUS

## 2023-10-11 MED ORDER — SODIUM CHLORIDE 0.9 % IV SOLN: INTRAVENOUS | Status: DC

## 2023-10-11 MED ORDER — PROPOFOL 10 MG/ML IV BOLUS
INTRAVENOUS | Status: DC | PRN
  Administered 2023-10-11 (×2): 10 mg via INTRAVENOUS

## 2023-10-11 MED ORDER — ROSUVASTATIN CALCIUM 20 MG PO TABS
20.0000 mg | ORAL_TABLET | Freq: Every day | ORAL | Status: DC
  Administered 2023-10-11 – 2023-10-14 (×4): 20 mg via ORAL
  Filled 2023-10-11 (×4): qty 1

## 2023-10-11 MED ORDER — ACETAMINOPHEN 650 MG RE SUPP: 650.0000 mg | Freq: Four times a day (QID) | RECTAL | Status: DC | PRN

## 2023-10-11 MED ORDER — LIDOCAINE HCL (PF) 1 % IJ SOLN
INTRAMUSCULAR | Status: DC | PRN
  Administered 2023-10-11: 5 mL
  Administered 2023-10-11 (×2): 2 mL

## 2023-10-11 MED ORDER — FENTANYL CITRATE (PF) 100 MCG/2ML IJ SOLN
INTRAMUSCULAR | Status: AC
  Filled 2023-10-11: qty 2

## 2023-10-11 MED ORDER — ACETAMINOPHEN 325 MG PO TABS
650.0000 mg | ORAL_TABLET | Freq: Four times a day (QID) | ORAL | Status: DC | PRN
  Administered 2023-10-13 – 2023-10-14 (×3): 650 mg via ORAL
  Filled 2023-10-11 (×3): qty 2

## 2023-10-11 MED ORDER — FENTANYL CITRATE (PF) 100 MCG/2ML IJ SOLN
INTRAMUSCULAR | Status: DC | PRN
  Administered 2023-10-11 (×2): 50 ug via INTRAVENOUS

## 2023-10-11 MED ORDER — PROTAMINE SULFATE 10 MG/ML IV SOLN
INTRAVENOUS | Status: DC | PRN
  Administered 2023-10-11: 140 mg via INTRAVENOUS

## 2023-10-11 MED ORDER — SODIUM CHLORIDE 0.9 % IV SOLN: 250.0000 mL | INTRAVENOUS | Status: AC

## 2023-10-11 MED ORDER — SODIUM CHLORIDE 0.9% FLUSH
3.0000 mL | Freq: Two times a day (BID) | INTRAVENOUS | Status: DC
  Administered 2023-10-12 – 2023-10-14 (×5): 3 mL via INTRAVENOUS

## 2023-10-11 MED ORDER — NITROGLYCERIN IN D5W 200-5 MCG/ML-% IV SOLN: 0.0000 ug/min | INTRAVENOUS | Status: DC

## 2023-10-11 MED ORDER — TRAMADOL HCL 50 MG PO TABS
50.0000 mg | ORAL_TABLET | ORAL | Status: DC | PRN
  Administered 2023-10-13: 50 mg via ORAL
  Filled 2023-10-11: qty 1

## 2023-10-11 MED ORDER — HEPARIN (PORCINE) IN NACL 2000-0.9 UNIT/L-% IV SOLN
INTRAVENOUS | Status: DC | PRN
  Administered 2023-10-11: 1000 mL

## 2023-10-11 MED ORDER — SODIUM CHLORIDE 0.9 % IV SOLN
250.0000 mL | INTRAVENOUS | Status: DC | PRN
  Administered 2023-10-12: 250 mL via INTRAVENOUS

## 2023-10-11 MED ORDER — CHLORHEXIDINE GLUCONATE 0.12 % MT SOLN
15.0000 mL | Freq: Once | OROMUCOSAL | Status: AC
Start: 2023-10-12 — End: 2023-10-11
  Administered 2023-10-11: 15 mL via OROMUCOSAL
  Filled 2023-10-11: qty 15

## 2023-10-11 MED ORDER — LACTATED RINGERS IV SOLN: INTRAVENOUS | Status: DC

## 2023-10-11 MED ORDER — CHLORHEXIDINE GLUCONATE 4 % EX SOLN: 60.0000 mL | Freq: Once | CUTANEOUS | Status: DC

## 2023-10-11 MED ORDER — NOREPINEPHRINE 4 MG/250ML-% IV SOLN
0.1000 ug/min | INTRAVENOUS | Status: DC
  Administered 2023-10-11: 2 ug/min via INTRAVENOUS
  Filled 2023-10-11 (×2): qty 250

## 2023-10-11 MED ORDER — CEFAZOLIN SODIUM-DEXTROSE 2-4 GM/100ML-% IV SOLN
2.0000 g | Freq: Three times a day (TID) | INTRAVENOUS | Status: AC
  Administered 2023-10-11 (×2): 2 g via INTRAVENOUS
  Filled 2023-10-11 (×2): qty 100

## 2023-10-11 MED ORDER — MORPHINE SULFATE (PF) 2 MG/ML IV SOLN
1.0000 mg | INTRAVENOUS | Status: DC | PRN
  Administered 2023-10-12: 2 mg via INTRAVENOUS
  Filled 2023-10-11: qty 1

## 2023-10-11 MED ORDER — MIDAZOLAM HCL 2 MG/2ML IJ SOLN
INTRAMUSCULAR | Status: AC
  Filled 2023-10-11: qty 2

## 2023-10-11 MED ORDER — PANTOPRAZOLE SODIUM 40 MG PO TBEC
40.0000 mg | DELAYED_RELEASE_TABLET | Freq: Every day | ORAL | Status: DC
  Administered 2023-10-11 – 2023-10-12 (×2): 40 mg via ORAL
  Filled 2023-10-11 (×2): qty 1

## 2023-10-11 MED ORDER — OXYCODONE HCL 5 MG PO TABS: 5.0000 mg | ORAL_TABLET | ORAL | Status: DC | PRN

## 2023-10-11 MED ORDER — CHLORHEXIDINE GLUCONATE 4 % EX SOLN: 30.0000 mL | CUTANEOUS | Status: DC

## 2023-10-11 MED ORDER — SODIUM CHLORIDE 0.9% FLUSH: 3.0000 mL | INTRAVENOUS | Status: DC | PRN

## 2023-10-11 MED ORDER — ONDANSETRON HCL 4 MG/2ML IJ SOLN
4.0000 mg | Freq: Four times a day (QID) | INTRAMUSCULAR | Status: DC | PRN
  Administered 2023-10-11 – 2023-10-12 (×2): 4 mg via INTRAVENOUS
  Filled 2023-10-11 (×2): qty 2

## 2023-10-11 SURGICAL SUPPLY — 30 items
BAG SNAP BAND KOVER 36X36 (MISCELLANEOUS) ×4 IMPLANT
BALLN TRUE 18X4.5 (BALLOONS) ×2 IMPLANT
BALLN TRUE 22X4.5 (BALLOONS) ×2 IMPLANT
BALLOON TRUE 18X4.5 (BALLOONS) IMPLANT
BALLOON TRUE 22X4.5 (BALLOONS) IMPLANT
CABLE ADAPT PACING TEMP 12FT (ADAPTER) IMPLANT
CATH DIAG 6FR PIGTAIL ANGLED (CATHETERS) IMPLANT
CATH INFINITI 5 FR STR PIGTAIL (CATHETERS) IMPLANT
CATH INFINITI 6F AL1 (CATHETERS) IMPLANT
CLOSURE MYNX CONTROL 6F/7F (Vascular Products) IMPLANT
CLOSURE PERCLOSE PROSTYLE (VASCULAR PRODUCTS) IMPLANT
INTRODUCER PERFORM 14 30 .038 (SHEATH) IMPLANT
KIT MICROPUNCTURE NIT STIFF (SHEATH) IMPLANT
PACK CARDIAC CATHETERIZATION (CUSTOM PROCEDURE TRAY) ×2 IMPLANT
PAD SORBX EP SHIELD 16.5X12 (MISCELLANEOUS) IMPLANT
SET ATX-X65L (MISCELLANEOUS) IMPLANT
SHEATH PINNACLE 6F 10CM (SHEATH) IMPLANT
SHEATH PINNACLE 8F 10CM (SHEATH) IMPLANT
SHEATH PROBE COVER 6X72 (BAG) IMPLANT
SHIELD CATH-GARD CONTAMINATION (MISCELLANEOUS) IMPLANT
STOPCOCK MORSE 400PSI 3WAY (MISCELLANEOUS) ×4 IMPLANT
SYS EVOLUT FX LOADING 23-29 (CATHETERS) ×2 IMPLANT
SYSTEM EVOLUT FX LOADING 23-29 (CATHETERS) IMPLANT
VALVE EVOLUT FX+ TRANS 29 (Valve) IMPLANT
WIRE AMPLATZ SS-J .035X180CM (WIRE) IMPLANT
WIRE EMERALD 3MM-J .035X150CM (WIRE) IMPLANT
WIRE EMERALD 3MM-J .035X260CM (WIRE) IMPLANT
WIRE EMERALD ST .035X260CM (WIRE) IMPLANT
WIRE PACING TEMP ST TIP 5 (CATHETERS) IMPLANT
WIRE SAFARI SM CURVE 275 (WIRE) IMPLANT

## 2023-10-11 NOTE — Discharge Summary (Incomplete)
 HEART AND VASCULAR CENTER   MULTIDISCIPLINARY HEART VALVE TEAM  Discharge Summary    Patient ID: Carolyn Rowland MRN: [id number]; DOB: 1940-11-29  Admit date: 10/11/2023 Discharge date: 10/12/2023  PCP:  Estevan Oaks, NP  [facility] Cardiologist:  Rinaldo Cloud, MD  [facility] Structural heart: Tonny Bollman, MD [facility] Electrophysiologist:  None   Discharge Diagnoses    Principal Problem:   S/P TAVR (transcatheter aortic valve replacement) Active Problems:   Coronary atherosclerosis of native coronary artery   Breast cancer of upper-outer quadrant of right female breast [facility])   Spinal stenosis of lumbosacral region   Severe aortic stenosis   Hypertension   High cholesterol   CHB (complete heart block) (HCC)   Allergies Allergies  Allergen Reactions   Penicillins Rash    Has patient had a PCN reaction causing immediate rash, facial/tongue/throat swelling, SOB or lightheadedness with hypotension: No Has patient had a PCN reaction causing severe rash involving mucus membranes or skin necrosis: Unknown Has patient had a PCN reaction that required hospitalization: No Has patient had a PCN reaction occurring within the last 10 years: No If all of the above answers are "NO", then may proceed with Cephalosporin use.     Diagnostic Studies/Procedures    TAVR OPERATIVE NOTE     Date of Procedure:                10/11/2023    Preoperative Diagnosis:      Severe Aortic Stenosis    Postoperative Diagnosis:    Same    Procedure:        Transcatheter Aortic Valve Replacement - Percutaneous Transfemoral Approach             Medtronic Evolut Fx+ (size 29 mm, serial # [id number] )              Co-Surgeons:                        Alleen Borne, MD and Tonny Bollman, MD   Anesthesiologist:                  Shona Simpson, MD   Echocardiographer:              Riley Lam, MD   Pre-operative Echo Findings: Severe aortic stenosis Normal left  ventricular systolic function   Post-operative Echo Findings: Trace paravalvular leak Normal/unchanged left ventricular systolic function   _____________    Echo 10/12/23: ***  History of Present Illness     Carolyn Rowland is a 83 y.o. female with a history of right breast cancer (s/p lumpectomy, radiation, and antiestrogen therapy), CAD s/p previous PCI to RCA and LCx (2015), HTN, HLD, CKD stage IIIa, morbid obesity (BMI 38), RBBB, and severe AS who [facility] to [facility] for planned TAVR.   She developed progressive dyspnea and dizziness. Echo 08/12/23 showed EF 60%, severe LVH, mild TR, and severe AS with mean grad 27.5 mmHg, AVA 0.70 cm2. [facility] 08/30/23 showed 10% prox Cx to Mid Cx stenosis, 80% 2nd Mrg stenosis, 10% ost RCA to mid RCA stenosis, 10% dist Cx stenosis and patent proximal to mid right coronary artery stents with minimal in-stent restenosis and widely patent proximal and distal left circumflex stents. There was a high-grade lesion of the ostium of the first obtuse marginal that should be treated medically and mild diffuse nonobstructive disease of the LAD. TAVR CTs showed an aortic valve calcium score  of 1766 c/w severe AS.    The patient was evaluated by the multidisciplinary valve team and felt to have severe, symptomatic aortic stenosis and to be a suitable candidate for TAVR, which was set up for 10/11/23.  Hospital Course     Consultants: none   Severe AS:  -- S/p successful TAVR with a *** mm Medtronic Evolut FX THV via the TF approach on 10/11/23.  -- Post operative echo completed but pending formal read. -- Groin sites are stable.  -- Continue Asprin 81mg  daily.  -- Met with cardiac rehab to discuss CRP phase II.  -- Plan for discharge home today with close follow up in the outpatient setting.   CHB: -- Pt with underlying RBBB and developed CHB after TAVR.  CAD: -- Continue medical management.  -- Continue Asprin 81mg  daily.  -- Continue Imdur 60mg  daily.  --  Continue Crestor 20mg  daily.   HTN: -- Continue Toprol XL 50mg  daily.  -- Continue Norvasc 2.5mg  daily.  -- Continue Imdur 60mg  daily.  -- Continue Spironolactone 12.5mg  daily.   Pulmonary nodule: -- 5 mm pleural based right lower lobe pulmonary nodule. No follow-up needed if patient is low-risk (high risk given breast cancer history) .Marland Kitchen  -- Will discuss in the outpatient setting. Marland Kitchen   History of breast cancer: -- Pre TAVR CTs showed "asymmetric right breast tissue with stated late appearance of some of the breast tissues with associated biopsy marker adjacent to this. Finding could be due to prior treatment given MRI breast 08/20/2014 report. Recommend correlation with more recent diagnostic mammography." -- Will discuss in the outpatient setting.   _____________  Discharge Vitals Blood pressure 137/72, pulse 79, temperature (!) 97.4 F (36.3 C), temperature source Oral, resp. rate (!) 21, height 5\' 5"  (1.651 m), weight 100.1 kg, SpO2 98%.  Filed Weights   10/11/23 1109 10/12/23 0500  Weight: 90.7 kg 100.1 kg     GEN: Well nourished, well developed in no acute distress NECK: No JVD CARDIAC: ***RRR, no murmurs, rubs, gallops RESPIRATORY:  Clear to auscultation without rales, wheezing or rhonchi  ABDOMEN: Soft, non-tender, non-distended EXTREMITIES:  No edema; No deformity.  Groin sites clear without hematoma or ecchymosis. ****   Disposition   Pt is being discharged home today in good condition.  Follow-up Plans & Appointments     Follow-up Information     Janetta Hora, PA-C. Go on 10/26/2023.   Specialties: Cardiology, Radiology Why: @ 3:35pm, please arrive at least 15 minutes early. Contact information: [address] [place] [address] [phone]                  Discharge Medications   Allergies as of 10/12/2023       Reactions   Penicillins Rash   Has patient had a PCN reaction causing immediate rash, facial/tongue/throat  swelling, SOB or lightheadedness with hypotension: No Has patient had a PCN reaction causing severe rash involving mucus membranes or skin necrosis: Unknown Has patient had a PCN reaction that required hospitalization: No Has patient had a PCN reaction occurring within the last 10 years: No If all of the above answers are "NO", then may proceed with Cephalosporin use.     Med Rec must be completed prior to using this [facility]***           Outstanding Labs/Studies   ***  ______________________  Duration of Discharge Encounter: APP Time: *** minutes    Signed, Cline Crock, PA-C 10/12/2023, 8:33  AM [phone]

## 2023-10-11 NOTE — Op Note (Signed)
 [facility]ART [facility]ENTER   MULTIDISCIPLINARY HEART VALVE TEAM   TAVR OPERATIVE NOTE   Date of Procedure:  10/11/2023   Preoperative Diagnosis: Severe Aortic Stenosis   Postoperative Diagnosis: Same   Procedure:   Transcatheter Aortic Valve Replacement - Percutaneous Transfemoral Approach  Medtronic Evolut Fx+ (size 29 mm, serial # [id number] )   Co-Surgeons:  Alleen Borne, MD and Tonny Bollman, MD  Anesthesiologist:  Shona Simpson, MD  Echocardiographer:  Riley Lam, MD  Pre-operative Echo Findings: Severe aortic stenosis Normal left ventricular systolic function  Post-operative Echo Findings: Trace paravalvular leak Normal/unchanged left ventricular systolic function  BRIEF CLINICAL NOTE AND INDICATIONS FOR SURGERY  83 year old woman with severe, stage D1 aortic stenosis who presents for TAVR after undergoing multidisciplinary heart team evaluation and appropriate preoperative imaging studies.  All of her data is reviewed and confirmed that she has suitable anatomy for transfemoral TAVR using a Medtronic supra annular 29 mm FX+ prosthesis.  During the course of the patient's preoperative work up they have been evaluated comprehensively by a multidisciplinary team of specialists coordinated through the Multidisciplinary Heart Valve Clinic in the [facility].  They have been demonstrated to suffer from symptomatic severe aortic stenosis as noted above. The patient has been counseled extensively as to the relative risks and benefits of all options for the treatment of severe aortic stenosis including long term medical therapy, conventional surgery for aortic valve replacement, and transcatheter aortic valve replacement.  The patient has been independently evaluated in formal cardiac surgical consultation by Dr Laneta Simmers, who deemed the patient appropriate for TAVR. Based upon review of all of the patient's preoperative diagnostic tests they are felt  to be candidate for transcatheter aortic valve replacement using the transfemoral approach as an alternative to conventional surgery.    Following the decision to proceed with transcatheter aortic valve replacement, a discussion has been held regarding what types of management strategies would be attempted intraoperatively in the event of life-threatening complications, including whether or not the patient would be considered a candidate for the use of cardiopulmonary bypass and/or conversion to open sternotomy for attempted surgical intervention.  The patient has been advised of a variety of complications that might develop peculiar to this approach including but not limited to risks of death, stroke, paravalvular leak, aortic dissection or other major vascular complications, aortic annulus rupture, device embolization, cardiac rupture or perforation, acute myocardial infarction, arrhythmia, heart block or bradycardia requiring permanent pacemaker placement, congestive heart failure, respiratory failure, renal failure, pneumonia, infection, other late complications related to structural valve deterioration or migration, or other complications that might ultimately cause a temporary or permanent loss of functional independence or other long term morbidity.  The patient provides full informed consent for the procedure as described and all questions were answered preoperatively.  DETAILS OF THE OPERATIVE PROCEDURE  PREPARATION:   The patient is brought to the operating room on the above mentioned date and central monitoring was established by the anesthesia team including placement of a radial arterial line. The patient is placed in the supine position on the operating table.  Intravenous antibiotics are administered. The patient is monitored closely throughout the procedure under conscious sedation.  Baseline transthoracic echocardiogram is performed. The patient's chest, abdomen, both groins, and both lower  extremities are prepared and draped in a sterile manner. A time out procedure is performed.   PERIPHERAL ACCESS:   Using ultrasound guidance, femoral arterial access is obtained with placement of a 6  Fr sheath on the left side.  [place] images are captured and digitally stored in the patient's record. A pigtail diagnostic catheter was passed through the femoral arterial sheath under fluoroscopic guidance into the aortic root (non-coronary cusp).  Ultrasound-guided access is used to insert a 6 [place] sheath into the right internal jugular vein.  A straight tip temporary transvenous pacemaker catheter was passed through the IJ venous sheath under fluoroscopic guidance into the right ventricle.  The pacemaker was tested to ensure stable lead placement and pacemaker capture.   TRANSFEMORAL ACCESS:  A micropuncture technique is used to access the right femoral artery under fluoroscopic and ultrasound guidance.  [place] images are captured and digitally stored in the patient's chart. 2 Perclose devices are deployed at 10' and 2' positions to 'PreClose' the femoral artery. A 14 French sheath is placed over an Amplatz Superstiff wire.   An AL-1 catheter was used to direct a straight-tip exchange length wire across the native aortic valve into the left ventricle. This was exchanged out for a pigtail catheter and position was confirmed in the LV apex.  Simultaneous LV and Ao pressures were recorded.  The pigtail catheter was exchanged for a Confida wire in the LV apex.    BALLOON AORTIC VALVULOPLASTY:  Performed with an 18 mm Bard true balloon under rapid ventricular pacing at 180 bpm with rapid hemodynamic recovery.  TRANSCATHETER HEART VALVE DEPLOYMENT:  A Medtronic Evolut Pro-Plus transcatheter heart valve (size 29 mm) was prepared and crimped per manufacturer's guidelines, and the proper orientation of the valve is confirmed on the Medtronic delivery system. The valve was advanced through the introducer sheath and  across the aortic arch over the Confida wire until it is positioned at the base of the pigtail catheter in the aortic valve annulus. Using the fine tuning wheel, valve deployment begins until annular contact is made. Controlled ventricular pacing is performed. Once proper position is confirmed via aortic root angiograms in the cusp overlap and LAO projections, the valve is deployed to 80% where it is fully functional. The patient's hemodynamic recovery following valve deployment is poor.  She had persistent hypotension and we decided to quickly perform an aortic root angiogram which demonstrated severe paravalvular regurgitation.  The anesthesia team was able to support her blood pressure and quickly improve her blood pressure to obtain and maintain an adequate perfusion pressure.  The inline catheter is removed from the body and changed out to the 14 [place] sheath again.  A pigtail catheter is easily advanced through the valve into the LV apex and the safari wire is reinserted.  The valve is then postdilated with a 22 mm Bard true balloon under rapid pacing with good expansion of the stent.  Echo demostrated acceptable post-procedural gradients, stable mitral valve function, and trace aortic insufficiency.    PROCEDURE COMPLETION:  The sheath was removed and femoral artery closure is performed using the 2 previously deployed Perclose devices.  Protamine is administered once femoral arterial repair was complete. The site is clear with no evidence of bleeding or hematoma after the sutures are tightened. The temporary pacemaker is secured in place as the patient was pacemaker dependent following the procedure.  The pigtail catheter is removed. Mynx closure is used for contralateral femoral arterial hemostasis for the 6 Fr sheath.  The patient tolerated the procedure well and is transported to the recovery area in stable condition. There were no immediate intraoperative complications. All sponge instrument and  needle counts are verified correct at completion  of the operation.   Tonny Bollman, MD 10/11/2023 8:04 PM

## 2023-10-11 NOTE — Transfer of Care (Signed)
 Immediate Anesthesia Transfer of Care Note  Patient: [name]  Procedure(s) Performed: Transcatheter Aortic Valve Replacement, Transfemoral (Right) ECHOCARDIOGRAM, TRANSTHORACIC  Patient Location: SICU  Anesthesia Type:General  Level of Consciousness: awake  Airway & Oxygen Therapy: Patient Spontanous Breathing  Post-op Assessment: Report given to RN and Post -op Vital signs reviewed and stable  Post vital signs: Reviewed and stable  Last Vitals:  Vitals Value Taken Time  BP 131/107 10/11/23 1730  Temp 98   Pulse 69 10/11/23 1739  Resp 11 10/11/23 1739  SpO2 98 % 10/11/23 1739  Vitals shown include unfiled device data.  Last Pain:  Vitals:   10/11/23 1146  TempSrc:   PainSc: 0-No pain         Complications: There were no known notable events for this encounter.

## 2023-10-11 NOTE — Progress Notes (Signed)
 Nurse paged reporting patient with low BP 70s systolic upon arrival to 2H17, pacing increased to 80, BP is better 90s systolic. Called Dr Excell Seltzer, who will evaluate the patient.

## 2023-10-11 NOTE — Interval H&P Note (Signed)
 History and Physical Interval Note:  10/11/2023 3:02 PM  Carolyn Rowland  has presented today for surgery, with the diagnosis of Severe Aortic Stenosis.  The various methods of treatment have been discussed with the patient and family. After consideration of risks, benefits and other options for treatment, the patient has consented to  Procedure(s): Transcatheter Aortic Valve Replacement, Transfemoral (Right) ECHOCARDIOGRAM, TRANSTHORACIC (N/A) as a surgical intervention.  The patient's history has been reviewed, patient examined, no change in status, stable for surgery.  I have reviewed the patient's chart and labs.  Questions were answered to the patient's satisfaction.     Alleen Borne

## 2023-10-11 NOTE — Anesthesia Preprocedure Evaluation (Signed)
 Anesthesia Evaluation  Patient identified by MRN, date of birth, ID band Patient awake    Reviewed: Allergy & Precautions, NPO status , Patient's Chart, lab work & pertinent test results  Airway Mallampati: II  TM Distance: >3 FB Neck ROM: Full    Dental  (+) Edentulous Upper, Dental Advisory Given   Pulmonary former smoker   breath sounds clear to auscultation       Cardiovascular hypertension, Pt. on medications + CAD   Rhythm:Regular Rate:Normal + Systolic murmurs Echo:  1. Left ventricular ejection fraction, by estimation, is 60 to 65%. The  left ventricle has normal function. The left ventricle has no regional  wall motion abnormalities. There is severe left ventricular hypertrophy.  Left ventricular diastolic parameters   are consistent with Grade I diastolic dysfunction (impaired relaxation).  Elevated left ventricular end-diastolic pressure. The average left  ventricular global longitudinal strain is -20.4 %. The global longitudinal  strain is normal.   2. Right ventricular systolic function is normal. The right ventricular  size is normal.   3. Left atrial size was moderately dilated.   4. The mitral valve is abnormal. Trivial mitral valve regurgitation. No  evidence of mitral stenosis.   5. AS moderate by gradients and DVI but severe based on AVA 0.7cm2 using  VTI. Valve is severely calcified . The aortic valve is tricuspid. There is  severe calcifcation of the aortic valve. There is severe thickening of the  aortic valve. Aortic valve  regurgitation is trivial. Moderate to severe aortic valve stenosis.   6. The inferior vena cava is normal in size with greater than 50%  respiratory variability, suggesting right atrial pressure of 3 mmHg.     Neuro/Psych    GI/Hepatic Neg liver ROS, hiatal hernia,GERD  Medicated,,  Endo/Other  negative endocrine ROS    Renal/GU negative Renal ROS      Musculoskeletal  (+) Arthritis ,    Abdominal   Peds  Hematology  (+) Blood dyscrasia, anemia   Anesthesia Other Findings   Reproductive/Obstetrics                             Anesthesia Physical Anesthesia Plan  ASA: 4  Anesthesia Plan: MAC   Post-op Pain Management: Minimal or no pain anticipated   Induction:   PONV Risk Score and Plan: 0 and Propofol infusion  Airway Management Planned: Natural Airway and Nasal Cannula  Additional Equipment: None  Intra-op Plan:   Post-operative Plan:   Informed Consent: I have reviewed the patients History and Physical, chart, labs and discussed the procedure including the risks, benefits and alternatives for the proposed anesthesia with the patient or authorized representative who has indicated his/her understanding and acceptance.       Plan Discussed with: CRNA  Anesthesia Plan Comments:        Anesthesia Quick Evaluation

## 2023-10-11 NOTE — Progress Notes (Signed)
 Called to evaluate patient for hypotension this evening.  The patient had hypotension after arrival in the CV-ICU.  Per RN at bedside, the patient's blood pressure has improved as her anesthesia has worn off further.  At the time of my evaluation, her blood pressure was approximately 100/65 mmHg.  She was in no pain.  She just complained of dry mouth but was otherwise without specific complaint.  Bilateral groin sites are clear with no hematoma or ecchymoses at either site.  She remains pacemaker dependent with ventricular paced rate of 80 bpm.  I spoke with her about her likely need for permanent pacemaker tomorrow.  The patient will be made n.p.o. after midnight.  Otherwise appears clinically stable at this time.  Tonny Bollman 10/11/2023 8:08 PM

## 2023-10-11 NOTE — Anesthesia Postprocedure Evaluation (Deleted)
 Anesthesia Post Note  Patient: Carolyn Rowland  Procedure(s) Performed: Transcatheter Aortic Valve Replacement, Transfemoral (Right) ECHOCARDIOGRAM, TRANSTHORACIC     Patient location during evaluation: PACU Anesthesia Type: MAC Level of consciousness: awake and alert Pain management: pain level controlled Vital Signs Assessment: post-procedure vital signs reviewed and stable Respiratory status: spontaneous breathing, nonlabored ventilation, respiratory function stable and patient connected to nasal cannula oxygen Cardiovascular status: stable and blood pressure returned to baseline Postop Assessment: no apparent nausea or vomiting Anesthetic complications: no  There were no known notable events for this encounter.  Last Vitals:  Vitals:   10/11/23 1658 10/11/23 1703  BP:    Pulse: 70 70  Resp: 15 15  Temp:    SpO2:      Last Pain:  Vitals:   10/11/23 1146  TempSrc:   PainSc: 0-No pain                 Shelton Silvas

## 2023-10-11 NOTE — Op Note (Signed)
 [facility] AND VASCULAR [facility]ENTER   MULTIDISCIPLINARY HEART VALVE TEAM     TAVR OPERATIVE NOTE   NAME@ [id number]  Date of Procedure:                 10/11/2023   Preoperative Diagnosis:      Severe Aortic Stenosis    Postoperative Diagnosis:    Same    Procedure:        Transcatheter Aortic Valve Replacement - Percutaneous Right Transfemoral Approach             Medtronic Evolut FX + (size 29 mm, serial # [id number])              Co-Surgeons:            Alleen Borne, MD and Tonny Bollman, MD     Anesthesiologist:                  Shona Simpson, MD   Echocardiographer:              Riley Lam, MD   Pre-operative Echo Findings: Severe aortic stenosis  Normal left ventricular systolic function   Post-operative Echo Findings: No paravalvular leak Normal left ventricular systolic function      BRIEF CLINICAL NOTE AND INDICATIONS FOR SURGERY   83 yo female with NYHA class 2 symptoms of severe low flow low gradient AS and with normal LV function and patent coronary stents. I agree that she would benefit from AVR and with her age would favor TAVR. She would do well with a medtronic valve size 26 evolute however could discuss the oversized 29mm if felt acceptable by team at time of implant. All the risks and goals and recover from the procedure were discussed and she is willing to proceed and is a bail out candidate        DETAILS OF THE OPERATIVE PROCEDURE    PREPARATION:    The patient was brought to the operating room on the above mentioned date and appropriate monitoring was established by the anesthesia team. The patient was placed in the supine position on the operating table.  Intravenous antibiotics were administered. The patient was monitored closely throughout the procedure under conscious sedation.    Baseline transthoracic echocardiogram was performed. The patient's abdomen and both groins were prepped and draped in a sterile manner. A time out procedure was  performed.   PERIPHERAL ACCESS:    A 6 Fr sheath was placed in the right internal jugular vein using ultrasound guidance.  A temporary transvenous pacemaker catheter was passed through the left femoral venous sheath under fluoroscopic guidance into the right ventricle.  The pacemaker was tested to ensure stable lead placement and pacemaker capture.   Using the modified Seldinger technique, femoral arterial access was obtained with placement of a 6 Fr sheath on the left side.  A pigtail diagnostic catheter was passed through the left arterial sheath under fluoroscopic guidance into the aortic root. Aortic root angiography was performed in order to determine the optimal angiographic angle for valve deployment.    TRANSFEMORAL ACCESS:    Percutaneous transfemoral access and sheath placement was performed using ultrasound guidance.  The right common femoral artery was cannulated using a micropuncture needle.  A pair of Abbott Perclose percutaneous closure devices were placed and a 14 French sheath replaced into the femoral artery.  The patient was heparinized systemically and ACT verified > 250 seconds.     An AL-1 catheter was used to  direct a straight-tip exchange length wire across the native aortic valve into the left ventricle. This was exchanged out for a pigtail catheter and position was confirmed in the LV apex. Simultaneous LV and Ao pressures were recorded.  The pigtail catheter was exchanged for a [place] wire in the LV apex.    BALLOON AORTIC VALVULOPLASTY:    The valve was dilated using an 18 mm True balloon. Recovery was good.    TRANSCATHETER HEART VALVE DEPLOYMENT:    A Medtronic Evolut FX + transcatheter heart valve (size 29 mm) was prepared and loaded into the delivery catheter system per manufacturer's guidelines and the proper orientation of the valve is confirmed under fluoroscopy. The 14 Fr sheath was removed. The delivery system and inline sheath were inserted into the right  common femoral artery over the [facility] wire and the inline sheath advanced into the abdominal aorta under fluoroscopic guidance. The delivery catheter was advanced around the aortic arch and the valve was carefully positioned across the aortic valve annulus. An aortic root injection was performed to confirm position and the valve deployed using cusp overlap technique under fluoroscopic guidance. Intermittent pacing was used during valve deployment. There was immediate development of complete heart block with no ventricular escape and the patient was paced with the temporary pacer. The delivery system and guidewire were retracted into the descending aorta and the nosecone re-sheathed. Valve function was assessed using echocardiography. There is felt to be a large paravalvular leak adjacent to the left coronary cusp. The patient's hemodynamic recovery following valve deployment was slow due to significant paravalvular leak and probably incomplete expansion of the valve. Therefore post-dilation was performed using a 22 mm True balloon under rapid ventricular pacing. This resulted in visual complete expansion of the valve at the annular level and complete resolution of the paravalvular leak with normal valve function on echo. Hemodynamic recovery was rapid.       PROCEDURE COMPLETION:    The delivery system and in-line sheath were removed and femoral artery closure performed using the Perclose devices.  Protamine was administered once femoral arterial repair was complete. The temporary pacemaker, pigtail catheter and femoral sheath were removed with manual pressure used venous for hemostasis and a Mynx closure device for contralateral arterial hemostasis.    The patient tolerated the procedure well and is transported to the cardiac ICU in stable condition. There were no immediate intraoperative complications except for complete heart block. All sponge instrument and needle counts are verified correct at completion  of the operation.    No blood products were administered during the operation.        Alleen Borne, MD

## 2023-10-11 NOTE — Discharge Instructions (Signed)

## 2023-10-11 NOTE — Anesthesia Procedure Notes (Signed)
 Arterial Line Insertion Start/End4/02/2024 12:50 PM, 10/11/2023 1:00 PM Performed by: Cy Blamer, CRNA, CRNA  Patient location: Pre-op. Preanesthetic checklist: patient identified, IV checked, site marked, risks and benefits discussed, surgical consent, monitors and equipment checked, pre-op evaluation, timeout performed and anesthesia consent Lidocaine 1% used for infiltration Left, radial was placed Catheter size: 20 G Hand hygiene performed  and maximum sterile barriers used  Allen's test indicative of satisfactory collateral circulation Attempts: 1 Procedure performed using ultrasound guided technique. Ultrasound Notes:anatomy identified and needle tip was noted to be adjacent to the nerve/plexus identified Following insertion, Biopatch and dressing applied. Post procedure assessment: normal and unchanged  Patient tolerated the procedure well with no immediate complications.

## 2023-10-12 ENCOUNTER — Inpatient Hospital Stay (HOSPITAL_COMMUNITY)
Admission: RE | Disposition: A | Discharge status: Home / Self Care | Payer: Self-pay | Source: Home / Self Care | Attending: Cardiovascular Disease

## 2023-10-12 ENCOUNTER — Encounter (HOSPITAL_COMMUNITY): Payer: Self-pay | Admitting: Cardiovascular Disease

## 2023-10-12 ENCOUNTER — Inpatient Hospital Stay (HOSPITAL_COMMUNITY)

## 2023-10-12 DIAGNOSIS — I959 Hypotension, unspecified: Secondary | ICD-10-CM | POA: Diagnosis not present

## 2023-10-12 DIAGNOSIS — Z952 Presence of prosthetic heart valve: Secondary | ICD-10-CM

## 2023-10-12 DIAGNOSIS — I350 Nonrheumatic aortic (valve) stenosis: Principal | ICD-10-CM

## 2023-10-12 DIAGNOSIS — I442 Atrioventricular block, complete: Secondary | ICD-10-CM

## 2023-10-12 DIAGNOSIS — R079 Chest pain, unspecified: Secondary | ICD-10-CM | POA: Diagnosis not present

## 2023-10-12 LAB — CBC
HCT: 42.1 % (ref 36.0–46.0)
Hemoglobin: 14.6 g/dL (ref 12.0–15.0)
MCH: 27.5 pg (ref 26.0–34.0)
MCHC: 34.7 g/dL (ref 30.0–36.0)
MCV: 79.4 fL — ABNORMAL LOW (ref 80.0–100.0)
Platelets: 124 10*3/uL — ABNORMAL LOW (ref 150–400)
RBC: 5.3 MIL/uL — ABNORMAL HIGH (ref 3.87–5.11)
RDW: 15 % (ref 11.5–15.5)
WBC: 7.4 10*3/uL (ref 4.0–10.5)
nRBC: 0 % (ref 0.0–0.2)

## 2023-10-12 LAB — BASIC METABOLIC PANEL WITH GFR
Anion gap: 9 (ref 5–15)
BUN: 10 mg/dL (ref 8–23)
CO2: 21 mmol/L — ABNORMAL LOW (ref 22–32)
Calcium: 8.4 mg/dL — ABNORMAL LOW (ref 8.9–10.3)
Chloride: 108 mmol/L (ref 98–111)
Creatinine, Ser: 0.58 mg/dL (ref 0.44–1.00)
GFR, Estimated: >60 mL/min (ref >60)
Glucose, Bld: 99 mg/dL (ref 70–99)
Potassium: 4 mmol/L (ref 3.5–5.1)
Sodium: 138 mmol/L (ref 135–145)

## 2023-10-12 LAB — ECHOCARDIOGRAM COMPLETE
AR max vel: 1.65 cm2
AV Area VTI: 1.45 cm2
AV Area mean vel: 1.51 cm2
AV Mean grad: 9 mmHg
AV Peak grad: 15.4 mmHg
Ao pk vel: 1.96 m/s
Height: 65 in
S' Lateral: 1.8 cm
Weight: 3530.89 [oz_av]

## 2023-10-12 LAB — MAGNESIUM: Magnesium: 1.8 mg/dL (ref 1.7–2.4)

## 2023-10-12 SURGERY — PACEMAKER IMPLANT

## 2023-10-12 MED ORDER — VANCOMYCIN HCL IN DEXTROSE 1-5 GM/200ML-% IV SOLN
INTRAVENOUS | Status: AC
  Filled 2023-10-12: qty 200

## 2023-10-12 MED ORDER — ONDANSETRON HCL 4 MG/2ML IJ SOLN
INTRAMUSCULAR | Status: AC
  Filled 2023-10-12: qty 2

## 2023-10-12 MED ORDER — SODIUM CHLORIDE 0.9 % IV SOLN
INTRAVENOUS | Status: AC
Start: 2023-10-12 — End: 2023-10-13
  Filled 2023-10-12: qty 2

## 2023-10-12 MED ORDER — CEFAZOLIN SODIUM-DEXTROSE 2-4 GM/100ML-% IV SOLN
2.0000 g | INTRAVENOUS | Status: DC
  Filled 2023-10-12: qty 100

## 2023-10-12 MED ORDER — FENTANYL CITRATE (PF) 100 MCG/2ML IJ SOLN
INTRAMUSCULAR | Status: AC
  Filled 2023-10-12: qty 2

## 2023-10-12 MED ORDER — CEFAZOLIN SODIUM-DEXTROSE 2-3 GM-%(50ML) IV SOLR
INTRAVENOUS | Status: AC | PRN
  Administered 2023-10-12: 2 g via INTRAVENOUS

## 2023-10-12 MED ORDER — HEPARIN (PORCINE) IN NACL 1000-0.9 UT/500ML-% IV SOLN
INTRAVENOUS | Status: DC | PRN
  Administered 2023-10-12: 500 mL

## 2023-10-12 MED ORDER — MIDAZOLAM HCL 2 MG/2ML IJ SOLN
INTRAMUSCULAR | Status: AC
  Filled 2023-10-12: qty 2

## 2023-10-12 MED ORDER — CHLORHEXIDINE GLUCONATE 4 % EX SOLN: 60.0000 mL | Freq: Once | CUTANEOUS | Status: DC

## 2023-10-12 MED ORDER — SODIUM CHLORIDE 0.9% FLUSH: 3.0000 mL | Freq: Two times a day (BID) | INTRAVENOUS | Status: DC

## 2023-10-12 MED ORDER — SODIUM CHLORIDE 0.9 % IV SOLN
80.0000 mg | INTRAVENOUS | Status: DC
  Filled 2023-10-12: qty 2

## 2023-10-12 MED ORDER — FAMOTIDINE IN NACL 20-0.9 MG/50ML-% IV SOLN
20.0000 mg | Freq: Once | INTRAVENOUS | Status: AC
  Administered 2023-10-12: 20 mg via INTRAVENOUS
  Filled 2023-10-12: qty 50

## 2023-10-12 MED ORDER — MIDAZOLAM HCL 5 MG/5ML IJ SOLN
INTRAMUSCULAR | Status: DC | PRN
  Administered 2023-10-12 (×4): .5 mg via INTRAVENOUS

## 2023-10-12 MED ORDER — LIDOCAINE HCL (PF) 1 % IJ SOLN
INTRAMUSCULAR | Status: DC | PRN
  Administered 2023-10-12: 30 mL

## 2023-10-12 MED ORDER — PANTOPRAZOLE SODIUM 40 MG IV SOLR
40.0000 mg | Freq: Two times a day (BID) | INTRAVENOUS | Status: DC
  Administered 2023-10-12 – 2023-10-13 (×3): 40 mg via INTRAVENOUS
  Filled 2023-10-12 (×4): qty 10

## 2023-10-12 MED ORDER — CHLORHEXIDINE GLUCONATE 4 % EX SOLN
60.0000 mL | Freq: Once | CUTANEOUS | Status: AC
  Administered 2023-10-12: 4 via TOPICAL
  Filled 2023-10-12: qty 60

## 2023-10-12 MED ORDER — CHLORHEXIDINE GLUCONATE CLOTH 2 % EX PADS
6.0000 | MEDICATED_PAD | Freq: Every day | CUTANEOUS | Status: DC
Start: 2023-10-12 — End: 2023-10-14
  Administered 2023-10-12 – 2023-10-14 (×3): 6 via TOPICAL

## 2023-10-12 MED ORDER — CEFAZOLIN SODIUM-DEXTROSE 1-4 GM/50ML-% IV SOLN
1.0000 g | Freq: Four times a day (QID) | INTRAVENOUS | Status: AC
  Administered 2023-10-12 – 2023-10-13 (×3): 1 g via INTRAVENOUS
  Filled 2023-10-12 (×3): qty 50

## 2023-10-12 MED ORDER — GENTAMICIN SULFATE 40 MG/ML IJ SOLN
INTRAMUSCULAR | Status: DC | PRN
  Administered 2023-10-12: 80 mg

## 2023-10-12 MED ORDER — SODIUM CHLORIDE 0.9% FLUSH: 3.0000 mL | INTRAVENOUS | Status: DC | PRN

## 2023-10-12 MED ORDER — ONDANSETRON HCL 4 MG/2ML IJ SOLN
INTRAMUSCULAR | Status: DC | PRN
  Administered 2023-10-12: 4 mg via INTRAVENOUS

## 2023-10-12 MED ORDER — FENTANYL CITRATE (PF) 100 MCG/2ML IJ SOLN
INTRAMUSCULAR | Status: DC | PRN
  Administered 2023-10-12 (×4): 25 ug via INTRAVENOUS

## 2023-10-12 MED ORDER — SODIUM CHLORIDE 0.9 % IV SOLN: INTRAVENOUS | Status: DC

## 2023-10-12 SURGICAL SUPPLY — 12 items

## 2023-10-12 NOTE — Consult Note (Addendum)
 ELECTROPHYSIOLOGY CONSULT NOTE    Patient ID: Carolyn Rowland MRN: [id number], DOB/AGE: July 16, 1940 83 y.o.  Admit date: 10/11/2023 Date of Consult: 10/12/2023  Primary Physician: Estevan Oaks, NP Primary Cardiologist: Rinaldo Cloud, MD  Electrophysiologist: New   Referring Provider: Dr. Excell Seltzer  Patient Profile: Carolyn Rowland is a 83 y.o. female with a history of Severe AS s/p TAVR this admission, RBBB, CAD, HTN, and HLD who is being seen today for the evaluation of post op CHB at the request of Dr. Excell Seltzer.  HPI:  DAFNA ROMO is a 83 y.o. female with medical history as above.   Pt presented yesterday for planned TAVR with severe AS. RBBB at baseline and Medtronic valve placed.  Pt had CHB intraoperatively requiring pacing and has remained device dependent in CHB overnight EP asked to see for pacing.   Feeling OK at rest. In good spirits and no acute symptoms. Did have some neck discomfort in bed this am as well as mild indigestion. Verbalizes understanding of situation and need for pacing.   Labs Potassium4.0 (04/09 4098) Magnesium  1.8 (04/09 0452) Creatinine, ser  0.58 (04/09 0452) PLT  124* (04/09 0452) HGB  14.6 (04/09 0452) WBC 7.4 (04/09 0452)  .    Allergies, Medical, Surgical, Social, and Family Histories have been reviewed and are referenced here-in when relevant for medical decision making.     Physical Exam: Vitals:   10/12/23 0915 10/12/23 0930 10/12/23 0945 10/12/23 1000  BP:  (!) 90/48  100/64  Pulse: 80 80 80 81  Resp: 16 18 17 14   Temp:      TempSrc:      SpO2: 100% 100% 100% 99%  Weight:      Height:        GEN- NAD, A&O x 3, normal affect HEENT: Normocephalic, atraumatic Lungs- CTAB, Normal effort.  Heart- Regular rate and rhythm, No M/G/R.  GI- Soft, NT, ND.  Extremities- No clubbing, cyanosis, or edema   Radiology/Studies: ECHOCARDIOGRAM LIMITED Result Date: 10/11/2023    ECHOCARDIOGRAM LIMITED REPORT   Patient Name:   Carolyn Rowland  Date of Exam: 10/11/2023 Medical Rec #:  [id number]     Height:       65.0 in Accession #:    [id number]    Weight:       200.0 lb Date of Birth:  May 24, 1941    BSA:          1.978 m Patient Age:    82 years      BP:           141/72 mmHg Patient Gender: F             HR:           85 bpm. Exam Location:  Inpatient Procedure: 2D Echo, Color Doppler and Cardiac Doppler (Both Spectral and Color            Flow Doppler were utilized during procedure). Indications:     Aortic Stenosis i35.0  History:         Patient has prior history of Echocardiogram examinations, most                  recent 08/12/2023. CAD; Risk Factors:Hypertension and                  Dyslipidemia.  Sonographer:     Irving Burton Senior RDCS Referring Phys:  [phone] MICHAEL COOPER Diagnosing Phys: Riley Lam MD  Sonographer Comments:  29mm Medtronic Evolut FX IMPRESSIONS  1. Prior to procedure, calcified aortic valve. No AI. LVOT Doppler aliasing precludes accurate pre-procedural DVI/AVA. Mean gradient 36 mm Hg, Peak gradient 52 mm Hg. Suggestive of significant aortic stenosis.     After procedure a 29 mm Evolut valve placed with significant peri-valvular leak adjacent the the left cusp.     At post deployment hypotension, no significant pericardial effusion.     After post dilation, ventricular placement without SAM or LVOT obstruction. Trace PVL adjacent to the intervalvular fibrosa. Mean gradient 3 mm Hg, Peak gradient 5 mm Hg, LVOT aliasing noted; EOA and DVI not recorded for this reason. Echo findings are consistent with normal structure and function of the aortic valve prosthesis.  2. Left ventricular ejection fraction, by estimation, is 65 to 70%. The left ventricle has normal function. There is severe asymmetric left ventricular hypertrophy of the septal segment.  3. The mitral valve is degenerative. Comparison(s): Prior images reviewed side by side. Successful valve placement. FINDINGS  Left Ventricle: Left ventricular ejection fraction, by  estimation, is 65 to 70%. The left ventricle has normal function. There is severe asymmetric left ventricular hypertrophy of the septal segment. Mitral Valve: The mitral valve is degenerative in appearance. Tricuspid Valve: The tricuspid valve is normal in structure. Tricuspid valve regurgitation is trivial. No evidence of tricuspid stenosis. Aortic Valve: Prior to procedure, calcified aortic valve. No AI. LVOT Doppler aliasing precludes accurate pre-procedural DVI/AVA. Mean gradient 36 mm Hg, Peak gradient 52 mm Hg. Suggestive of significant aortic stenosis. After procedure a 29 mm Evolut valve placed with significant peri-valvular leak adjacent the the left cusp. At post deployment hypotension, no significant pericardial effusion. After post dilation, ventricular placement without SAM or LVOT obstruction. Trace PVL adjacent to the intervalvular fibrosa. Mean gradient 3 mm Hg, Peak gradient 5 mm Hg, LVOT aliasing noted; EOA and DVI not recorded for this reason. Aortic valve mean gradient measures 3.0 mmHg. Aortic valve peak gradient measures 5.3 mmHg. Aortic valve area, by VTI measures 2.63 cm. Echo findings are consistent with normal structure and function of the aortic valve prosthesis. Additional Comments: Spectral Doppler performed. Color Doppler performed.  LEFT VENTRICLE PLAX 2D LVOT diam:     1.90 cm LV SV:         62 LV SV Index:   31 LVOT Area:     2.84 cm  AORTIC VALVE AV Area (Vmax):    2.39 cm AV Area (Vmean):   2.24 cm AV Area (VTI):     2.63 cm AV Vmax:           115.50 cm/s AV Vmean:          80.900 cm/s AV VTI:            0.234 m AV Peak Grad:      5.3 mmHg AV Mean Grad:      3.0 mmHg LVOT Vmax:         97.40 cm/s LVOT Vmean:        63.900 cm/s LVOT VTI:          0.217 m LVOT/AV VTI ratio: 0.93 TRICUSPID VALVE TR Peak grad:   37.0 mmHg TR Vmax:        304.00 cm/s  SHUNTS Systemic VTI:  0.22 m Systemic Diam: 1.90 cm Riley Lam MD Electronically signed by Riley Lam MD  Signature Date/Time: 10/11/2023/6:16:17 PM    Final    Structural Heart Procedure Result Date: 10/11/2023 See surgical note for  result.  DG Chest 2 View Result Date: 10/09/2023 CLINICAL DATA:  Aortic stenosis, severe.  Preop exam. EXAM: CHEST - 2 VIEW COMPARISON:  Radiograph 12/19/2019.  Chest CT 09/26/2023 FINDINGS: The cardiomediastinal contours are normal. Coronary stents visualized. Aortic atherosclerosis. Subsegmental atelectasis or scarring in the lingula. Pulmonary vasculature is normal. No consolidation, pleural effusion, or pneumothorax. No acute osseous abnormalities are seen. IMPRESSION: No active cardiopulmonary disease. Electronically Signed   By: Narda Rutherford M.D.   On: 10/09/2023 10:44   CT ANGIO CHEST AORTA W/CM & OR WO/CM Addendum Date: 09/28/2023 ADDENDUM REPORT: 09/28/2023 23:29 ADDENDUM: Please note the technique should state instead TECHNIQUE: Non-contrast CT of the chest was initially obtained. Multidetector CT imaging through the chest, abdomen and pelvis was performed using the standard protocol during bolus administration of intravenous contrast. Multiplanar reconstructed images and MIPs were obtained and reviewed to evaluate the vascular anatomy. RADIATION DOSE REDUCTION: This exam was performed according to the departmental dose-optimization program which includes automated exposure control, adjustment of the mA and/or kV according to patient size and/or use of iterative reconstruction technique. Electronically Signed   By: Tish Frederickson M.D.   On: 09/28/2023 23:29   Result Date: 09/28/2023 CLINICAL DATA:  Aortic valve replacement (TAVR), pre-op eval EXAM: CT ANGIOGRAPHY CHEST CT ABDOMEN AND PELVIS WITH CONTRAST TECHNIQUE: Multidetector CT imaging of the chest was performed using the standard protocol during bolus administration of intravenous contrast. Multiplanar CT image reconstructions and MIPs were obtained to evaluate the vascular anatomy. Multidetector CT imaging of the  abdomen and pelvis was performed using the standard protocol during bolus administration of intravenous contrast. RADIATION DOSE REDUCTION: This exam was performed according to the departmental dose-optimization program which includes automated exposure control, adjustment of the mA and/or kV according to patient size and/or use of iterative reconstruction technique. CONTRAST:  OMNIPAQUE IOHEXOL 350 MG/ML SOLN COMPARISON:  MRI breast 08/20/2014 FINDINGS: CTA CHEST FINDINGS Cardiovascular: Preferential opacification of the thoracic aorta. No evidence of thoracic aortic aneurysm or dissection. Normal heart size. No significant pericardial effusion. Severe calcified and noncalcified atherosclerotic plaque of the thoracic aorta. Four-vessel coronary artery calcifications. Aortic valve leaflet calcification. The main pulmonary artery is normal in caliber. No central or segmental pulmonary embolus. Mediastinum/Nodes: No enlarged mediastinal, hilar, or axillary lymph nodes. Thyroid gland, trachea, and esophagus demonstrate no significant findings. Lungs/Pleura: Biapical emphysematous changes. No focal consolidation. No pulmonary nodule. No pulmonary mass. No pleural effusion. No pneumothorax. Musculoskeletal: Asymmetric right breast tissue with stated late appearance of some of the breast tissues (506:12) with associated biopsy marker adjacent to this. No suspicious lytic or blastic osseous lesions. No acute displaced fracture. Review of the MIP images confirms the above findings. CT ABDOMEN and PELVIS FINDINGS Hepatobiliary: No focal liver abnormality. No gallstones, gallbladder wall thickening, or pericholecystic fluid. No biliary dilatation. Pancreas: No focal lesion. Normal pancreatic contour. No surrounding inflammatory changes. No main pancreatic ductal dilatation. Spleen: Normal in size without focal abnormality. Adrenals/Urinary Tract: No adrenal nodule bilaterally. Bilateral kidneys enhance symmetrically. No  hydronephrosis. No hydroureter. Limited evaluation of the urinary bladder due to streak artifact originating from the bilateral femoral surgical hardware. On delayed imaging, there is no urothelial wall thickening and there are no filling defects in the opacified portions of the bilateral collecting systems or ureters. Stomach/Bowel: Stomach is within normal limits. No evidence of bowel wall thickening or dilatation. Colonic diverticulosis. Limited evaluation of the colon within the pelvis due to streak artifact originating from bilateral femoral surgical hardware. The appendix  is not definitely identified with no inflammatory changes in the right lower quadrant to suggest acute appendicitis. Vascular/Lymphatic: No abdominal aorta or iliac aneurysm. Severe atherosclerotic plaque of the aorta and its branches. No abdominal, pelvic, or inguinal lymphadenopathy. Reproductive: Limited evaluation due to streak artifact originating from bilateral femoral surgical hardware. Other: No intraperitoneal free fluid. No intraperitoneal free gas. No organized fluid collection. Musculoskeletal: No abdominal wall hernia or abnormality. No suspicious lytic or blastic osseous lesions. No acute displaced fracture. Grade 1 anterolisthesis of L4 on L5. Mild retrolisthesis of L5 on S1. Intervertebral disc space vacuum phenomenon at the L4-L5 L5-S1 levels. Endplate sclerosis at the L5-S1 level. Total bilateral hip arthroplasty partially visualized. Review of the MIP images confirms the above findings. IMPRESSION: 1. No thoracic or abdominal aorta aneurysm or dissection. 2. Aortic Atherosclerosis (ICD10-I70.0) including four-vessel and aortic valve leaflet calcifications-correlate for aortic stenosis. 3.  Emphysema (ICD10-J43.9). 4. No acute intra-abdominal or intrapelvic abnormality. 5. Asymmetric right breast tissue with stated late appearance of some of the breast tissues with associated biopsy marker adjacent to this. Finding could be  due to prior treatment given MRI breast 08/20/2014 report. Recommend correlation with more recent diagnostic mammography. 6. Limited evaluation of the pelvis due to streak artifact originating from bilateral femoral surgical hardware. Electronically Signed: By: Tish Frederickson M.D. On: 09/28/2023 23:17   CT ANGIO ABDOMEN PELVIS  W & WO CONTRAST Addendum Date: 09/28/2023 ADDENDUM REPORT: 09/28/2023 23:29 ADDENDUM: Please note the technique should state instead TECHNIQUE: Non-contrast CT of the chest was initially obtained. Multidetector CT imaging through the chest, abdomen and pelvis was performed using the standard protocol during bolus administration of intravenous contrast. Multiplanar reconstructed images and MIPs were obtained and reviewed to evaluate the vascular anatomy. RADIATION DOSE REDUCTION: This exam was performed according to the departmental dose-optimization program which includes automated exposure control, adjustment of the mA and/or kV according to patient size and/or use of iterative reconstruction technique. Electronically Signed   By: Tish Frederickson M.D.   On: 09/28/2023 23:29   Result Date: 09/28/2023 CLINICAL DATA:  Aortic valve replacement (TAVR), pre-op eval EXAM: CT ANGIOGRAPHY CHEST CT ABDOMEN AND PELVIS WITH CONTRAST TECHNIQUE: Multidetector CT imaging of the chest was performed using the standard protocol during bolus administration of intravenous contrast. Multiplanar CT image reconstructions and MIPs were obtained to evaluate the vascular anatomy. Multidetector CT imaging of the abdomen and pelvis was performed using the standard protocol during bolus administration of intravenous contrast. RADIATION DOSE REDUCTION: This exam was performed according to the departmental dose-optimization program which includes automated exposure control, adjustment of the mA and/or kV according to patient size and/or use of iterative reconstruction technique. CONTRAST:  OMNIPAQUE IOHEXOL  350 MG/ML SOLN COMPARISON:  MRI breast 08/20/2014 FINDINGS: CTA CHEST FINDINGS Cardiovascular: Preferential opacification of the thoracic aorta. No evidence of thoracic aortic aneurysm or dissection. Normal heart size. No significant pericardial effusion. Severe calcified and noncalcified atherosclerotic plaque of the thoracic aorta. Four-vessel coronary artery calcifications. Aortic valve leaflet calcification. The main pulmonary artery is normal in caliber. No central or segmental pulmonary embolus. Mediastinum/Nodes: No enlarged mediastinal, hilar, or axillary lymph nodes. Thyroid gland, trachea, and esophagus demonstrate no significant findings. Lungs/Pleura: Biapical emphysematous changes. No focal consolidation. No pulmonary nodule. No pulmonary mass. No pleural effusion. No pneumothorax. Musculoskeletal: Asymmetric right breast tissue with stated late appearance of some of the breast tissues (506:12) with associated biopsy marker adjacent to this. No suspicious lytic or blastic osseous lesions. No acute displaced fracture.  Review of the MIP images confirms the above findings. CT ABDOMEN and PELVIS FINDINGS Hepatobiliary: No focal liver abnormality. No gallstones, gallbladder wall thickening, or pericholecystic fluid. No biliary dilatation. Pancreas: No focal lesion. Normal pancreatic contour. No surrounding inflammatory changes. No main pancreatic ductal dilatation. Spleen: Normal in size without focal abnormality. Adrenals/Urinary Tract: No adrenal nodule bilaterally. Bilateral kidneys enhance symmetrically. No hydronephrosis. No hydroureter. Limited evaluation of the urinary bladder due to streak artifact originating from the bilateral femoral surgical hardware. On delayed imaging, there is no urothelial wall thickening and there are no filling defects in the opacified portions of the bilateral collecting systems or ureters. Stomach/Bowel: Stomach is within normal limits. No evidence of bowel wall  thickening or dilatation. Colonic diverticulosis. Limited evaluation of the colon within the pelvis due to streak artifact originating from bilateral femoral surgical hardware. The appendix is not definitely identified with no inflammatory changes in the right lower quadrant to suggest acute appendicitis. Vascular/Lymphatic: No abdominal aorta or iliac aneurysm. Severe atherosclerotic plaque of the aorta and its branches. No abdominal, pelvic, or inguinal lymphadenopathy. Reproductive: Limited evaluation due to streak artifact originating from bilateral femoral surgical hardware. Other: No intraperitoneal free fluid. No intraperitoneal free gas. No organized fluid collection. Musculoskeletal: No abdominal wall hernia or abnormality. No suspicious lytic or blastic osseous lesions. No acute displaced fracture. Grade 1 anterolisthesis of L4 on L5. Mild retrolisthesis of L5 on S1. Intervertebral disc space vacuum phenomenon at the L4-L5 L5-S1 levels. Endplate sclerosis at the L5-S1 level. Total bilateral hip arthroplasty partially visualized. Review of the MIP images confirms the above findings. IMPRESSION: 1. No thoracic or abdominal aorta aneurysm or dissection. 2. Aortic Atherosclerosis (ICD10-I70.0) including four-vessel and aortic valve leaflet calcifications-correlate for aortic stenosis. 3.  Emphysema (ICD10-J43.9). 4. No acute intra-abdominal or intrapelvic abnormality. 5. Asymmetric right breast tissue with stated late appearance of some of the breast tissues with associated biopsy marker adjacent to this. Finding could be due to prior treatment given MRI breast 08/20/2014 report. Recommend correlation with more recent diagnostic mammography. 6. Limited evaluation of the pelvis due to streak artifact originating from bilateral femoral surgical hardware. Electronically Signed: By: Tish Frederickson M.D. On: 09/28/2023 23:17   CT CARDIAC MORPH/PULM VEIN W/CM&W/O CA SCORE Addendum Date: 09/28/2023 ADDENDUM  REPORT: 09/28/2023 22:28 EXAM: OVER-READ INTERPRETATION  CT CHEST The following report is an over-read performed by radiologist Dr. Aram Candela of [facility] on 09/28/2023. This over-read does not include interpretation of cardiac or coronary anatomy or pathology. The coronary calcium score/coronary CTA interpretation by the cardiologist is attached. COMPARISON:  None. FINDINGS: Cardiovascular: There are no significant extracardiac vascular findings. Mediastinum/Nodes: There are no enlarged lymph nodes within the visualized mediastinum. Lungs/Pleura: There is no pleural effusion. A 5 mm pleural based pulmonary nodule is seen within the lateral aspect of the right lung base (axial CT image 82, CT series 13). Mild atelectatic changes are seen along the posterior aspect of the bilateral lower lobes. Upper abdomen: No significant findings in the visualized upper abdomen. Musculoskeletal/Chest wall: No chest wall mass or suspicious osseous findings within the visualized chest. IMPRESSION: 5 mm pleural based right lower lobe pulmonary nodule. No follow-up needed if patient is low-risk.This recommendation follows the consensus statement: Guidelines for Management of Incidental Pulmonary Nodules Detected on CT Images: From the Fleischner Society 2017; Radiology 2017; 284:228-243. Electronically Signed   By: Aram Candela M.D.   On: 09/28/2023 22:28   Result Date: 09/28/2023 CLINICAL DATA:  Severe Aortic Stenosis. EXAM:  Cardiac TAVR CT TECHNIQUE: A non-contrast, gated CT scan was obtained with axial slices of 3 mm through the heart for aortic valve calcium scoring. A 110 kV retrospective, gated, contrast cardiac scan was obtained. Gantry rotation speed was 250 msecs and collimation was 0.6 mm. Nitroglycerin was not given. The 3D data set was reconstructed in 5% intervals of the 0-95% of the R-R cycle. Systolic and diastolic phases were analyzed on a dedicated workstation using MPR, MIP, and VRT modes.  The patient received 100 cc of contrast. FINDINGS: Image quality: Excellent. Noise artifact is: Limited. Valve Morphology: Tricuspid aortic valve with diffuse severe calcifications. Severely restricted leaflet motion in systole. Aortic Valve Calcium score: 1766 Aortic annular dimension: Phase assessed: 30% Annular area: 397 mm2 Annular perimeter: 71.8 mm Max diameter: 25.0 mm Min diameter: 19.9 mm Annular and subannular calcification: None. Membranous septum length: 6.5 mm Optimal coplanar projection: LAO 20 CRA 7 Coronary Artery Height above Annulus: Left Main: 13.3 mm Right Coronary: 17.8 mm Sinus of Valsalva Measurements: Non-coronary: 30 mm Right-coronary: 30 mm Left-coronary: 29 mm Sinus of Valsalva Height: Non-coronary: 22.1 mm Right-coronary: 23.7 mm Left-coronary: 22.4 mm Sinotubular Junction: 29 mm Ascending Thoracic Aorta: 33 mm Coronary Arteries: Normal coronary origin. Right dominance. The study was performed without use of NTG and is insufficient for plaque evaluation. Please refer to recent cardiac catheterization for coronary assessment. 3-vessel coronary calcifications. Cardiac Morphology: Right Atrium: Right atrial size is within normal limits. Right Ventricle: The right ventricular cavity is within normal limits. Left Atrium: Left atrial size is normal in size with no left atrial appendage filling defect. No LAA thrombus on delayed imaging. Left Ventricle: The ventricular cavity size is within normal limits. Pulmonary arteries: Normal in size without proximal filling defect. Pulmonary veins: Normal pulmonary venous drainage. Pericardium: Normal thickness with no significant effusion or calcium present. Mitral Valve: The mitral valve is normal structure without significant calcification. Extra-cardiac findings: See attached radiology report for non-cardiac structures. IMPRESSION: 1. Annular measurements support a 23 mm S3 or 26 mm Evolut Pro. 2. No significant annular or subannular calcifications.  3. Sufficient coronary to annulus distance. 4. Optimal Fluoroscopic Angle for Delivery: LAO 20 CRA 7 [place] T. Flora Lipps, MD Electronically Signed: By: Lennie Odor M.D. On: 09/20/2023 13:12    EKG: Baseline EKG 4/4 showed NSR at 70 bpm with RBBB  (personally reviewed)  TELEMETRY: V pacing at 80, underlying CHB with escape in 40s (personally reviewed)   Assessment/Plan:  CHB s/p TAVR With RBBB at baseline restoration of conduction is unlikely.  Explained risks, benefits, and alternatives to PPM implantation, including but not limited to bleeding, infection, pneumothorax, pericardial effusion, lead dislodgement, heart attack, stroke, or death.  Pt verbalized understanding and agrees to proceed.       Will check rhythm again this afternoon prior to pacing, but tentatively on for MDT DDD PPM.   For questions or updates, please contact [facility] Please consult [email or web address] for contact info under Cardiology/STEMI.  Signed, Graciella Freer, PA-C  10/12/2023 10:29 AM  I have seen, examined the patient, and reviewed the above assessment and plan.    HPI: Carolyn Rowland is an 83 year old female with a history of sright breast cancer (s/p lumpectomy, radiation, and antiestrogen therapy), CAD s/p previous PCI to RCA and LCx (2015), HTN, HLD, CKD stage IIIa, morbid obesity (BMI 38), RBBB, and severe AS who underwent TAVR on 4/8.  After deployment of her Medtronic TAVR valve, the patient developed complete heart block.  Right  IJ temporary pacing wire was implanted.  She has been pacemaker dependent since the valve was implanted.  She was monitored overnight and the cardiac ICU.  There were no other acute complications.  This morning she reports feeling okay with no new or acute complaints.  GEN: No acute distress.   Neck: Right IJ temporary pacing wire. Cardiac: Normal rate, regular rhythm. Resp: Normal work of breathing. Ext: No edema. Psych: Normal affect   Assessment and Plan: Ms.  Rowland is an 83 year old female with a pre-existing right bundle branch block who underwent TAVR implantation with a Medtronic Evolut valve which was complicated by complete heart block.  She was seen this morning remained pacemaker dependent.  She had no underlying conduction at VVI 30 bpm nor did she have a reliable escape rhythm.  #.  Complete heart block: Given pre-existing right bundle branch block and self-expanding Medtronic valve, likelihood of recovery appears to be low.  He currently has a right IJ temporary pacing wire in place.  Bedside interrogation was performed.  We have seen no evidence of recovery thus far nor does she have a stable escape rhythm.  We will tentatively plan for pacemaker implant later today if there are signs of improvement. -Explained risks, benefits, and alternatives to pacemaker implantation, including but not limited to bleeding, infection, damage to heart or lungs, heart attack, stroke, or death.  Pt verbalized understanding and agrees to proceed if indicated.  -She has a normal LVEF, so we will proceed with dual-chamber pacemaker implantation.   Nobie Putnam, MD 10/12/2023 2:42 PM

## 2023-10-12 NOTE — H&P (View-Only) (Signed)
 ELECTROPHYSIOLOGY CONSULT NOTE    Patient ID: EPSIE WALTHALL MRN: [id number], DOB/AGE: July 16, 1940 83 y.o.  Admit date: 10/11/2023 Date of Consult: 10/12/2023  Primary Physician: Estevan Oaks, NP Primary Cardiologist: Rinaldo Cloud, MD  Electrophysiologist: New   Referring Provider: Dr. Excell Seltzer  Patient Profile: Carolyn Rowland is a 83 y.o. female with a history of Severe AS s/p TAVR this admission, RBBB, CAD, HTN, and HLD who is being seen today for the evaluation of post op CHB at the request of Dr. Excell Seltzer.  HPI:  Carolyn Rowland is a 83 y.o. female with medical history as above.   Pt presented yesterday for planned TAVR with severe AS. RBBB at baseline and Medtronic valve placed.  Pt had CHB intraoperatively requiring pacing and has remained device dependent in CHB overnight EP asked to see for pacing.   Feeling OK at rest. In good spirits and no acute symptoms. Did have some neck discomfort in bed this am as well as mild indigestion. Verbalizes understanding of situation and need for pacing.   Labs Potassium4.0 (04/09 4098) Magnesium  1.8 (04/09 0452) Creatinine, ser  0.58 (04/09 0452) PLT  124* (04/09 0452) HGB  14.6 (04/09 0452) WBC 7.4 (04/09 0452)  .    Allergies, Medical, Surgical, Social, and Family Histories have been reviewed and are referenced here-in when relevant for medical decision making.     Physical Exam: Vitals:   10/12/23 0915 10/12/23 0930 10/12/23 0945 10/12/23 1000  BP:  (!) 90/48  100/64  Pulse: 80 80 80 81  Resp: 16 18 17 14   Temp:      TempSrc:      SpO2: 100% 100% 100% 99%  Weight:      Height:        GEN- NAD, A&O x 3, normal affect HEENT: Normocephalic, atraumatic Lungs- CTAB, Normal effort.  Heart- Regular rate and rhythm, No M/G/R.  GI- Soft, NT, ND.  Extremities- No clubbing, cyanosis, or edema   Radiology/Studies: ECHOCARDIOGRAM LIMITED Result Date: 10/11/2023    ECHOCARDIOGRAM LIMITED REPORT   Patient Name:   Carolyn Rowland  Date of Exam: 10/11/2023 Medical Rec #:  [id number]     Height:       65.0 in Accession #:    [id number]    Weight:       200.0 lb Date of Birth:  May 24, 1941    BSA:          1.978 m Patient Age:    82 years      BP:           141/72 mmHg Patient Gender: F             HR:           85 bpm. Exam Location:  Inpatient Procedure: 2D Echo, Color Doppler and Cardiac Doppler (Both Spectral and Color            Flow Doppler were utilized during procedure). Indications:     Aortic Stenosis i35.0  History:         Patient has prior history of Echocardiogram examinations, most                  recent 08/12/2023. CAD; Risk Factors:Hypertension and                  Dyslipidemia.  Sonographer:     Irving Burton Senior RDCS Referring Phys:  [phone] MICHAEL COOPER Diagnosing Phys: Riley Lam MD  Sonographer Comments:  29mm Medtronic Evolut FX IMPRESSIONS  1. Prior to procedure, calcified aortic valve. No AI. LVOT Doppler aliasing precludes accurate pre-procedural DVI/AVA. Mean gradient 36 mm Hg, Peak gradient 52 mm Hg. Suggestive of significant aortic stenosis.     After procedure a 29 mm Evolut valve placed with significant peri-valvular leak adjacent the the left cusp.     At post deployment hypotension, no significant pericardial effusion.     After post dilation, ventricular placement without SAM or LVOT obstruction. Trace PVL adjacent to the intervalvular fibrosa. Mean gradient 3 mm Hg, Peak gradient 5 mm Hg, LVOT aliasing noted; EOA and DVI not recorded for this reason. Echo findings are consistent with normal structure and function of the aortic valve prosthesis.  2. Left ventricular ejection fraction, by estimation, is 65 to 70%. The left ventricle has normal function. There is severe asymmetric left ventricular hypertrophy of the septal segment.  3. The mitral valve is degenerative. Comparison(s): Prior images reviewed side by side. Successful valve placement. FINDINGS  Left Ventricle: Left ventricular ejection fraction, by  estimation, is 65 to 70%. The left ventricle has normal function. There is severe asymmetric left ventricular hypertrophy of the septal segment. Mitral Valve: The mitral valve is degenerative in appearance. Tricuspid Valve: The tricuspid valve is normal in structure. Tricuspid valve regurgitation is trivial. No evidence of tricuspid stenosis. Aortic Valve: Prior to procedure, calcified aortic valve. No AI. LVOT Doppler aliasing precludes accurate pre-procedural DVI/AVA. Mean gradient 36 mm Hg, Peak gradient 52 mm Hg. Suggestive of significant aortic stenosis. After procedure a 29 mm Evolut valve placed with significant peri-valvular leak adjacent the the left cusp. At post deployment hypotension, no significant pericardial effusion. After post dilation, ventricular placement without SAM or LVOT obstruction. Trace PVL adjacent to the intervalvular fibrosa. Mean gradient 3 mm Hg, Peak gradient 5 mm Hg, LVOT aliasing noted; EOA and DVI not recorded for this reason. Aortic valve mean gradient measures 3.0 mmHg. Aortic valve peak gradient measures 5.3 mmHg. Aortic valve area, by VTI measures 2.63 cm. Echo findings are consistent with normal structure and function of the aortic valve prosthesis. Additional Comments: Spectral Doppler performed. Color Doppler performed.  LEFT VENTRICLE PLAX 2D LVOT diam:     1.90 cm LV SV:         62 LV SV Index:   31 LVOT Area:     2.84 cm  AORTIC VALVE AV Area (Vmax):    2.39 cm AV Area (Vmean):   2.24 cm AV Area (VTI):     2.63 cm AV Vmax:           115.50 cm/s AV Vmean:          80.900 cm/s AV VTI:            0.234 m AV Peak Grad:      5.3 mmHg AV Mean Grad:      3.0 mmHg LVOT Vmax:         97.40 cm/s LVOT Vmean:        63.900 cm/s LVOT VTI:          0.217 m LVOT/AV VTI ratio: 0.93 TRICUSPID VALVE TR Peak grad:   37.0 mmHg TR Vmax:        304.00 cm/s  SHUNTS Systemic VTI:  0.22 m Systemic Diam: 1.90 cm Riley Lam MD Electronically signed by Riley Lam MD  Signature Date/Time: 10/11/2023/6:16:17 PM    Final    Structural Heart Procedure Result Date: 10/11/2023 See surgical note for  result.  DG Chest 2 View Result Date: 10/09/2023 CLINICAL DATA:  Aortic stenosis, severe.  Preop exam. EXAM: CHEST - 2 VIEW COMPARISON:  Radiograph 12/19/2019.  Chest CT 09/26/2023 FINDINGS: The cardiomediastinal contours are normal. Coronary stents visualized. Aortic atherosclerosis. Subsegmental atelectasis or scarring in the lingula. Pulmonary vasculature is normal. No consolidation, pleural effusion, or pneumothorax. No acute osseous abnormalities are seen. IMPRESSION: No active cardiopulmonary disease. Electronically Signed   By: Narda Rutherford M.D.   On: 10/09/2023 10:44   CT ANGIO CHEST AORTA W/CM & OR WO/CM Addendum Date: 09/28/2023 ADDENDUM REPORT: 09/28/2023 23:29 ADDENDUM: Please note the technique should state instead TECHNIQUE: Non-contrast CT of the chest was initially obtained. Multidetector CT imaging through the chest, abdomen and pelvis was performed using the standard protocol during bolus administration of intravenous contrast. Multiplanar reconstructed images and MIPs were obtained and reviewed to evaluate the vascular anatomy. RADIATION DOSE REDUCTION: This exam was performed according to the departmental dose-optimization program which includes automated exposure control, adjustment of the mA and/or kV according to patient size and/or use of iterative reconstruction technique. Electronically Signed   By: Tish Frederickson M.D.   On: 09/28/2023 23:29   Result Date: 09/28/2023 CLINICAL DATA:  Aortic valve replacement (TAVR), pre-op eval EXAM: CT ANGIOGRAPHY CHEST CT ABDOMEN AND PELVIS WITH CONTRAST TECHNIQUE: Multidetector CT imaging of the chest was performed using the standard protocol during bolus administration of intravenous contrast. Multiplanar CT image reconstructions and MIPs were obtained to evaluate the vascular anatomy. Multidetector CT imaging of the  abdomen and pelvis was performed using the standard protocol during bolus administration of intravenous contrast. RADIATION DOSE REDUCTION: This exam was performed according to the departmental dose-optimization program which includes automated exposure control, adjustment of the mA and/or kV according to patient size and/or use of iterative reconstruction technique. CONTRAST:  OMNIPAQUE IOHEXOL 350 MG/ML SOLN COMPARISON:  MRI breast 08/20/2014 FINDINGS: CTA CHEST FINDINGS Cardiovascular: Preferential opacification of the thoracic aorta. No evidence of thoracic aortic aneurysm or dissection. Normal heart size. No significant pericardial effusion. Severe calcified and noncalcified atherosclerotic plaque of the thoracic aorta. Four-vessel coronary artery calcifications. Aortic valve leaflet calcification. The main pulmonary artery is normal in caliber. No central or segmental pulmonary embolus. Mediastinum/Nodes: No enlarged mediastinal, hilar, or axillary lymph nodes. Thyroid gland, trachea, and esophagus demonstrate no significant findings. Lungs/Pleura: Biapical emphysematous changes. No focal consolidation. No pulmonary nodule. No pulmonary mass. No pleural effusion. No pneumothorax. Musculoskeletal: Asymmetric right breast tissue with stated late appearance of some of the breast tissues (506:12) with associated biopsy marker adjacent to this. No suspicious lytic or blastic osseous lesions. No acute displaced fracture. Review of the MIP images confirms the above findings. CT ABDOMEN and PELVIS FINDINGS Hepatobiliary: No focal liver abnormality. No gallstones, gallbladder wall thickening, or pericholecystic fluid. No biliary dilatation. Pancreas: No focal lesion. Normal pancreatic contour. No surrounding inflammatory changes. No main pancreatic ductal dilatation. Spleen: Normal in size without focal abnormality. Adrenals/Urinary Tract: No adrenal nodule bilaterally. Bilateral kidneys enhance symmetrically. No  hydronephrosis. No hydroureter. Limited evaluation of the urinary bladder due to streak artifact originating from the bilateral femoral surgical hardware. On delayed imaging, there is no urothelial wall thickening and there are no filling defects in the opacified portions of the bilateral collecting systems or ureters. Stomach/Bowel: Stomach is within normal limits. No evidence of bowel wall thickening or dilatation. Colonic diverticulosis. Limited evaluation of the colon within the pelvis due to streak artifact originating from bilateral femoral surgical hardware. The appendix  is not definitely identified with no inflammatory changes in the right lower quadrant to suggest acute appendicitis. Vascular/Lymphatic: No abdominal aorta or iliac aneurysm. Severe atherosclerotic plaque of the aorta and its branches. No abdominal, pelvic, or inguinal lymphadenopathy. Reproductive: Limited evaluation due to streak artifact originating from bilateral femoral surgical hardware. Other: No intraperitoneal free fluid. No intraperitoneal free gas. No organized fluid collection. Musculoskeletal: No abdominal wall hernia or abnormality. No suspicious lytic or blastic osseous lesions. No acute displaced fracture. Grade 1 anterolisthesis of L4 on L5. Mild retrolisthesis of L5 on S1. Intervertebral disc space vacuum phenomenon at the L4-L5 L5-S1 levels. Endplate sclerosis at the L5-S1 level. Total bilateral hip arthroplasty partially visualized. Review of the MIP images confirms the above findings. IMPRESSION: 1. No thoracic or abdominal aorta aneurysm or dissection. 2. Aortic Atherosclerosis (ICD10-I70.0) including four-vessel and aortic valve leaflet calcifications-correlate for aortic stenosis. 3.  Emphysema (ICD10-J43.9). 4. No acute intra-abdominal or intrapelvic abnormality. 5. Asymmetric right breast tissue with stated late appearance of some of the breast tissues with associated biopsy marker adjacent to this. Finding could be  due to prior treatment given MRI breast 08/20/2014 report. Recommend correlation with more recent diagnostic mammography. 6. Limited evaluation of the pelvis due to streak artifact originating from bilateral femoral surgical hardware. Electronically Signed: By: Tish Frederickson M.D. On: 09/28/2023 23:17   CT ANGIO ABDOMEN PELVIS  W & WO CONTRAST Addendum Date: 09/28/2023 ADDENDUM REPORT: 09/28/2023 23:29 ADDENDUM: Please note the technique should state instead TECHNIQUE: Non-contrast CT of the chest was initially obtained. Multidetector CT imaging through the chest, abdomen and pelvis was performed using the standard protocol during bolus administration of intravenous contrast. Multiplanar reconstructed images and MIPs were obtained and reviewed to evaluate the vascular anatomy. RADIATION DOSE REDUCTION: This exam was performed according to the departmental dose-optimization program which includes automated exposure control, adjustment of the mA and/or kV according to patient size and/or use of iterative reconstruction technique. Electronically Signed   By: Tish Frederickson M.D.   On: 09/28/2023 23:29   Result Date: 09/28/2023 CLINICAL DATA:  Aortic valve replacement (TAVR), pre-op eval EXAM: CT ANGIOGRAPHY CHEST CT ABDOMEN AND PELVIS WITH CONTRAST TECHNIQUE: Multidetector CT imaging of the chest was performed using the standard protocol during bolus administration of intravenous contrast. Multiplanar CT image reconstructions and MIPs were obtained to evaluate the vascular anatomy. Multidetector CT imaging of the abdomen and pelvis was performed using the standard protocol during bolus administration of intravenous contrast. RADIATION DOSE REDUCTION: This exam was performed according to the departmental dose-optimization program which includes automated exposure control, adjustment of the mA and/or kV according to patient size and/or use of iterative reconstruction technique. CONTRAST:  OMNIPAQUE IOHEXOL  350 MG/ML SOLN COMPARISON:  MRI breast 08/20/2014 FINDINGS: CTA CHEST FINDINGS Cardiovascular: Preferential opacification of the thoracic aorta. No evidence of thoracic aortic aneurysm or dissection. Normal heart size. No significant pericardial effusion. Severe calcified and noncalcified atherosclerotic plaque of the thoracic aorta. Four-vessel coronary artery calcifications. Aortic valve leaflet calcification. The main pulmonary artery is normal in caliber. No central or segmental pulmonary embolus. Mediastinum/Nodes: No enlarged mediastinal, hilar, or axillary lymph nodes. Thyroid gland, trachea, and esophagus demonstrate no significant findings. Lungs/Pleura: Biapical emphysematous changes. No focal consolidation. No pulmonary nodule. No pulmonary mass. No pleural effusion. No pneumothorax. Musculoskeletal: Asymmetric right breast tissue with stated late appearance of some of the breast tissues (506:12) with associated biopsy marker adjacent to this. No suspicious lytic or blastic osseous lesions. No acute displaced fracture.  Review of the MIP images confirms the above findings. CT ABDOMEN and PELVIS FINDINGS Hepatobiliary: No focal liver abnormality. No gallstones, gallbladder wall thickening, or pericholecystic fluid. No biliary dilatation. Pancreas: No focal lesion. Normal pancreatic contour. No surrounding inflammatory changes. No main pancreatic ductal dilatation. Spleen: Normal in size without focal abnormality. Adrenals/Urinary Tract: No adrenal nodule bilaterally. Bilateral kidneys enhance symmetrically. No hydronephrosis. No hydroureter. Limited evaluation of the urinary bladder due to streak artifact originating from the bilateral femoral surgical hardware. On delayed imaging, there is no urothelial wall thickening and there are no filling defects in the opacified portions of the bilateral collecting systems or ureters. Stomach/Bowel: Stomach is within normal limits. No evidence of bowel wall  thickening or dilatation. Colonic diverticulosis. Limited evaluation of the colon within the pelvis due to streak artifact originating from bilateral femoral surgical hardware. The appendix is not definitely identified with no inflammatory changes in the right lower quadrant to suggest acute appendicitis. Vascular/Lymphatic: No abdominal aorta or iliac aneurysm. Severe atherosclerotic plaque of the aorta and its branches. No abdominal, pelvic, or inguinal lymphadenopathy. Reproductive: Limited evaluation due to streak artifact originating from bilateral femoral surgical hardware. Other: No intraperitoneal free fluid. No intraperitoneal free gas. No organized fluid collection. Musculoskeletal: No abdominal wall hernia or abnormality. No suspicious lytic or blastic osseous lesions. No acute displaced fracture. Grade 1 anterolisthesis of L4 on L5. Mild retrolisthesis of L5 on S1. Intervertebral disc space vacuum phenomenon at the L4-L5 L5-S1 levels. Endplate sclerosis at the L5-S1 level. Total bilateral hip arthroplasty partially visualized. Review of the MIP images confirms the above findings. IMPRESSION: 1. No thoracic or abdominal aorta aneurysm or dissection. 2. Aortic Atherosclerosis (ICD10-I70.0) including four-vessel and aortic valve leaflet calcifications-correlate for aortic stenosis. 3.  Emphysema (ICD10-J43.9). 4. No acute intra-abdominal or intrapelvic abnormality. 5. Asymmetric right breast tissue with stated late appearance of some of the breast tissues with associated biopsy marker adjacent to this. Finding could be due to prior treatment given MRI breast 08/20/2014 report. Recommend correlation with more recent diagnostic mammography. 6. Limited evaluation of the pelvis due to streak artifact originating from bilateral femoral surgical hardware. Electronically Signed: By: Tish Frederickson M.D. On: 09/28/2023 23:17   CT CARDIAC MORPH/PULM VEIN W/CM&W/O CA SCORE Addendum Date: 09/28/2023 ADDENDUM  REPORT: 09/28/2023 22:28 EXAM: OVER-READ INTERPRETATION  CT CHEST The following report is an over-read performed by radiologist Dr. Aram Candela of [facility] on 09/28/2023. This over-read does not include interpretation of cardiac or coronary anatomy or pathology. The coronary calcium score/coronary CTA interpretation by the cardiologist is attached. COMPARISON:  None. FINDINGS: Cardiovascular: There are no significant extracardiac vascular findings. Mediastinum/Nodes: There are no enlarged lymph nodes within the visualized mediastinum. Lungs/Pleura: There is no pleural effusion. A 5 mm pleural based pulmonary nodule is seen within the lateral aspect of the right lung base (axial CT image 82, CT series 13). Mild atelectatic changes are seen along the posterior aspect of the bilateral lower lobes. Upper abdomen: No significant findings in the visualized upper abdomen. Musculoskeletal/Chest wall: No chest wall mass or suspicious osseous findings within the visualized chest. IMPRESSION: 5 mm pleural based right lower lobe pulmonary nodule. No follow-up needed if patient is low-risk.This recommendation follows the consensus statement: Guidelines for Management of Incidental Pulmonary Nodules Detected on CT Images: From the Fleischner Society 2017; Radiology 2017; 284:228-243. Electronically Signed   By: Aram Candela M.D.   On: 09/28/2023 22:28   Result Date: 09/28/2023 CLINICAL DATA:  Severe Aortic Stenosis. EXAM:  Cardiac TAVR CT TECHNIQUE: A non-contrast, gated CT scan was obtained with axial slices of 3 mm through the heart for aortic valve calcium scoring. A 110 kV retrospective, gated, contrast cardiac scan was obtained. Gantry rotation speed was 250 msecs and collimation was 0.6 mm. Nitroglycerin was not given. The 3D data set was reconstructed in 5% intervals of the 0-95% of the R-R cycle. Systolic and diastolic phases were analyzed on a dedicated workstation using MPR, MIP, and VRT modes.  The patient received 100 cc of contrast. FINDINGS: Image quality: Excellent. Noise artifact is: Limited. Valve Morphology: Tricuspid aortic valve with diffuse severe calcifications. Severely restricted leaflet motion in systole. Aortic Valve Calcium score: 1766 Aortic annular dimension: Phase assessed: 30% Annular area: 397 mm2 Annular perimeter: 71.8 mm Max diameter: 25.0 mm Min diameter: 19.9 mm Annular and subannular calcification: None. Membranous septum length: 6.5 mm Optimal coplanar projection: LAO 20 CRA 7 Coronary Artery Height above Annulus: Left Main: 13.3 mm Right Coronary: 17.8 mm Sinus of Valsalva Measurements: Non-coronary: 30 mm Right-coronary: 30 mm Left-coronary: 29 mm Sinus of Valsalva Height: Non-coronary: 22.1 mm Right-coronary: 23.7 mm Left-coronary: 22.4 mm Sinotubular Junction: 29 mm Ascending Thoracic Aorta: 33 mm Coronary Arteries: Normal coronary origin. Right dominance. The study was performed without use of NTG and is insufficient for plaque evaluation. Please refer to recent cardiac catheterization for coronary assessment. 3-vessel coronary calcifications. Cardiac Morphology: Right Atrium: Right atrial size is within normal limits. Right Ventricle: The right ventricular cavity is within normal limits. Left Atrium: Left atrial size is normal in size with no left atrial appendage filling defect. No LAA thrombus on delayed imaging. Left Ventricle: The ventricular cavity size is within normal limits. Pulmonary arteries: Normal in size without proximal filling defect. Pulmonary veins: Normal pulmonary venous drainage. Pericardium: Normal thickness with no significant effusion or calcium present. Mitral Valve: The mitral valve is normal structure without significant calcification. Extra-cardiac findings: See attached radiology report for non-cardiac structures. IMPRESSION: 1. Annular measurements support a 23 mm S3 or 26 mm Evolut Pro. 2. No significant annular or subannular calcifications.  3. Sufficient coronary to annulus distance. 4. Optimal Fluoroscopic Angle for Delivery: LAO 20 CRA 7 [place] T. Flora Lipps, MD Electronically Signed: By: Lennie Odor M.D. On: 09/20/2023 13:12    EKG: Baseline EKG 4/4 showed NSR at 70 bpm with RBBB  (personally reviewed)  TELEMETRY: V pacing at 80, underlying CHB with escape in 40s (personally reviewed)   Assessment/Plan:  CHB s/p TAVR With RBBB at baseline restoration of conduction is unlikely.  Explained risks, benefits, and alternatives to PPM implantation, including but not limited to bleeding, infection, pneumothorax, pericardial effusion, lead dislodgement, heart attack, stroke, or death.  Pt verbalized understanding and agrees to proceed.       Will check rhythm again this afternoon prior to pacing, but tentatively on for MDT DDD PPM.   For questions or updates, please contact [facility] Please consult [email or web address] for contact info under Cardiology/STEMI.  Signed, Graciella Freer, PA-C  10/12/2023 10:29 AM  I have seen, examined the patient, and reviewed the above assessment and plan.    HPI: Carolyn Rowland is an 83 year old female with a history of sright breast cancer (s/p lumpectomy, radiation, and antiestrogen therapy), CAD s/p previous PCI to RCA and LCx (2015), HTN, HLD, CKD stage IIIa, morbid obesity (BMI 38), RBBB, and severe AS who underwent TAVR on 4/8.  After deployment of her Medtronic TAVR valve, the patient developed complete heart block.  Right  IJ temporary pacing wire was implanted.  She has been pacemaker dependent since the valve was implanted.  She was monitored overnight and the cardiac ICU.  There were no other acute complications.  This morning she reports feeling okay with no new or acute complaints.  GEN: No acute distress.   Neck: Right IJ temporary pacing wire. Cardiac: Normal rate, regular rhythm. Resp: Normal work of breathing. Ext: No edema. Psych: Normal affect   Assessment and Plan: Ms.  Rowland is an 83 year old female with a pre-existing right bundle branch block who underwent TAVR implantation with a Medtronic Evolut valve which was complicated by complete heart block.  She was seen this morning remained pacemaker dependent.  She had no underlying conduction at VVI 30 bpm nor did she have a reliable escape rhythm.  #.  Complete heart block: Given pre-existing right bundle branch block and self-expanding Medtronic valve, likelihood of recovery appears to be low.  He currently has a right IJ temporary pacing wire in place.  Bedside interrogation was performed.  We have seen no evidence of recovery thus far nor does she have a stable escape rhythm.  We will tentatively plan for pacemaker implant later today if there are signs of improvement. -Explained risks, benefits, and alternatives to pacemaker implantation, including but not limited to bleeding, infection, damage to heart or lungs, heart attack, stroke, or death.  Pt verbalized understanding and agrees to proceed if indicated.  -She has a normal LVEF, so we will proceed with dual-chamber pacemaker implantation.   Nobie Putnam, MD 10/12/2023 2:42 PM

## 2023-10-12 NOTE — Progress Notes (Signed)
 Discussed with pt opportunity for CRPII and she is interested. Will refer to [place]. Will f/u after ppm to discuss exercise (pt feels poorly currently).  [id number] Carolyn Rowland BS, ACSM-CEP 10/12/2023 11:00 AM

## 2023-10-12 NOTE — Interval H&P Note (Signed)
 History and Physical Interval Note:  10/12/2023 2:56 PM  Carolyn Rowland  has presented today for surgery, with the diagnosis of complete heart block.  The various methods of treatment have been discussed with the patient and family. After consideration of risks, benefits and other options for treatment, the patient has consented to  Procedure(s): PACEMAKER IMPLANT (N/A) as a surgical intervention.  The patient's history has been reviewed, patient examined, no change in status, stable for surgery.  I have reviewed the patient's chart and labs.  Questions were answered to the patient's satisfaction.     Nobie Putnam

## 2023-10-12 NOTE — Anesthesia Postprocedure Evaluation (Signed)
 Anesthesia Post Note  Patient: Carolyn Rowland  Procedure(s) Performed: Transcatheter Aortic Valve Replacement, Transfemoral (Right) ECHOCARDIOGRAM, TRANSTHORACIC     Patient location during evaluation: PACU Anesthesia Type: MAC Level of consciousness: awake and alert Pain management: pain level controlled Vital Signs Assessment: post-procedure vital signs reviewed and stable Respiratory status: spontaneous breathing, nonlabored ventilation, respiratory function stable and patient connected to nasal cannula oxygen Cardiovascular status: stable and blood pressure returned to baseline Postop Assessment: no apparent nausea or vomiting Anesthetic complications: no   There were no known notable events for this encounter.              Shelton Silvas

## 2023-10-12 NOTE — TOC Initial Note (Signed)
 Transition of Care [facility]) - Initial/Assessment Note    Patient Details  Name: Carolyn Rowland MRN: [id number] Date of Birth: 1940/10/01  [facility]) CM/SW Contact:    Lawerance Sabal, RN Phone Number: 10/12/2023, 4:34 PM  Clinical Narrative:                  Patient admitted from home for TVR on 4/8. Admitted to ICU for hypotension post op. Decided upon pacemaker placement on 4/9.  Unable to speak w patient; as she was in procedure.  TOC will continue to follow for DC planning support.    Barriers to Discharge: Continued Medical Work up   Patient Goals and CMS Choice            Expected Discharge Plan and Services       Living arrangements for the past 2 months: Single Family Home                                      Prior Living Arrangements/Services Living arrangements for the past 2 months: Single Family Home Lives with:: Self                   Activities of Daily Living   ADL Screening (condition at time of admission) Independently performs ADLs?: Yes (appropriate for developmental age) Is the patient deaf or have difficulty hearing?: Yes Does the patient have difficulty seeing, even when wearing glasses/contacts?: No Does the patient have difficulty concentrating, remembering, or making decisions?: No  Permission Sought/Granted                  Emotional Assessment              Admission diagnosis:  Aortic stenosis, severe [I35.0] S/P TAVR (transcatheter aortic valve replacement) [Z95.2] Patient Active Problem List   Diagnosis Date Noted   CHB (complete heart block) (HCC) 10/12/2023   S/P TAVR (transcatheter aortic valve replacement) 10/11/2023   Hypertension    High cholesterol    Severe aortic stenosis 09/29/2023   Lumbar herniated disc 12/16/2020   Foot drop, right 12/16/2020   H/O total hip arthroplasty 12/27/2019   H/O total hip arthroplasty, left 12/26/2019   Spinal stenosis of lumbosacral region 09/22/2017    Breast cancer of upper-outer quadrant of right female breast (HCC) 08/27/2014   Coronary atherosclerosis of native coronary artery 08/18/2013   S/P PTCA (percutaneous transluminal coronary angioplasty) 08/17/2013   PCP:  Estevan Oaks, NP Pharmacy:   [facility] [phone] - [place],  - [address] AT [facility] [address] [place] [address] Phone: [phone] Fax: [phone]  [facility] [place], [place] - [address] [place] [address] Phone: [phone] Fax: [phone]     Social Drivers of Health (SDOH) Social History: SDOH Screenings   Food Insecurity: No Food Insecurity (01/23/2020)  Transportation Needs: No Transportation Needs (01/23/2020)  Depression (PHQ2-9): Low Risk  (01/23/2020)  Tobacco Use: Medium Risk (10/11/2023)   SDOH Interventions:     Readmission Risk Interventions     No data to display

## 2023-10-12 NOTE — Progress Notes (Addendum)
 HEART AND VASCULAR CENTER   MULTIDISCIPLINARY HEART VALVE TEAM  Patient Name: Carolyn Rowland Date of Encounter: 10/12/2023  Admit date: 10/11/2023  Primary Care Provider: Estevan Oaks, NP [facility] Cardiologist: Rinaldo Cloud, MD  [facility] Electrophysiologist:  None   Hospital Problem List     Principal Problem:   S/P TAVR (transcatheter aortic valve replacement) Active Problems:   Coronary atherosclerosis of native coronary artery   Breast cancer of upper-outer quadrant of right female breast [facility])   Spinal stenosis of lumbosacral region   Severe aortic stenosis   Hypertension   High cholesterol   CHB (complete heart block) (HCC)     Subjective   Having pain in her right neck radiating to her chest. Also feels like she has indigestion.   Inpatient Medications    Scheduled Meds:  aspirin EC  81 mg Oral Daily   Chlorhexidine Gluconate Cloth  6 each Topical Daily   pantoprazole  40 mg Oral Daily   pantoprazole (PROTONIX) IV  40 mg Intravenous Q12H   rosuvastatin  20 mg Oral Daily   sodium chloride flush  3 mL Intravenous Q12H   Continuous Infusions:  sodium chloride     sodium chloride Stopped (10/11/23 2017)   nitroGLYCERIN     norepinephrine (LEVOPHED) Adult infusion 0.7 mcg/min (10/12/23 0700)   PRN Meds: sodium chloride, acetaminophen **OR** acetaminophen, morphine injection, ondansetron (ZOFRAN) IV, oxyCODONE, sodium chloride flush, traMADol   Vital Signs    Vitals:   10/12/23 0700 10/12/23 0715 10/12/23 0730 10/12/23 0745  BP: 130/70  137/72   Pulse: 80 80 80 79  Resp: 16 (!) 8 18 (!) 21  Temp:      TempSrc:      SpO2: 100% 100% 100% 98%  Weight:      Height:        Intake/Output Summary (Last 24 hours) at 10/12/2023 0840 Last data filed at 10/12/2023 0700 Gross per 24 hour  Intake 1325.84 ml  Output 700 ml  Net 625.84 ml   Filed Weights   10/11/23 1109 10/12/23 0500  Weight: 90.7 kg 100.1 kg    Physical Exam   GEN: Well  nourished,. Appears uncomfortable.  HEENT: Grossly normal.  Neck: right internal jugular temp wire in place.  Cardiac: RRR, no murmurs, rubs, or gallops. No clubbing, cyanosis, edema.  Tender to palpation over RUSB. Respiratory:  Respirations regular and unlabored, clear to auscultation bilaterally. GI: Soft, nontender, nondistended, BS + x 4. MS: no deformity or atrophy. Skin: warm and dry, no rash.  Groin sites clear without hematoma or ecchymosis  Neuro:  Strength and sensation are intact. Psych: AAOx3.  Normal affect.  Labs    CBC Recent Labs    10/11/23 1751 10/12/23 0452  WBC  --  7.4  HGB 15.0 14.6  HCT 44.0 42.1  MCV  --  79.4*  PLT  --  124*   Basic Metabolic Panel Recent Labs    40/98/11 1751 10/12/23 0452  NA 140 138  K 3.8 4.0  CL 107 108  CO2  --  21*  GLUCOSE 139* 99  BUN 10 10  CREATININE 0.60 0.58  CALCIUM  --  8.4*  MG  --  1.8   Liver Function Tests No results for input(s): "AST", "ALT", "ALKPHOS", "BILITOT", "PROT", "ALBUMIN" in the last 72 hours. No results for input(s): "LIPASE", "AMYLASE" in the last 72 hours. Cardiac Enzymes No results for input(s): "CKTOTAL", "CKMB", "CKMBINDEX", "TROPONINI" in the last 72  hours. BNP Invalid input(s): "POCBNP" D-Dimer No results for input(s): "DDIMER" in the last 72 hours. Hemoglobin A1C No results for input(s): "HGBA1C" in the last 72 hours. Fasting Lipid Panel No results for input(s): "CHOL", "HDL", "LDLCALC", "TRIG", "CHOLHDL", "LDLDIRECT" in the last 72 hours. Thyroid Function Tests No results for input(s): "TSH", "T4TOTAL", "T3FREE", "THYROIDAB" in the last 72 hours.  Invalid input(s): "FREET3"  Telemetry    Pacing at 80 with CHB underlying rhythm. - Personally Reviewed  ECG    V paced rhythm with underlying CHB (pacer turned down for ECG) No acute ST/TW changes.  - Personally Reviewed  Radiology    ECHOCARDIOGRAM LIMITED Result Date: 10/11/2023    ECHOCARDIOGRAM LIMITED REPORT   Patient  Name:   Carolyn Rowland Date of Exam: 10/11/2023 Medical Rec #:  [id number]     Height:       65.0 in Accession #:    [id number]    Weight:       200.0 lb Date of Birth:  09-Jan-1941    BSA:          1.978 m Patient Age:    82 years      BP:           141/72 mmHg Patient Gender: F             HR:           85 bpm. Exam Location:  Inpatient Procedure: 2D Echo, Color Doppler and Cardiac Doppler (Both Spectral and Color            Flow Doppler were utilized during procedure). Indications:     Aortic Stenosis i35.0  History:         Patient has prior history of Echocardiogram examinations, most                  recent 08/12/2023. CAD; Risk Factors:Hypertension and                  Dyslipidemia.  Sonographer:     Irving Burton Senior RDCS Referring Phys:  [id number] Elyna Pangilinan Diagnosing Phys: Riley Lam MD  Sonographer Comments: 29mm Medtronic Evolut FX IMPRESSIONS  1. Prior to procedure, calcified aortic valve. No AI. LVOT Doppler aliasing precludes accurate pre-procedural DVI/AVA. Mean gradient 36 mm Hg, Peak gradient 52 mm Hg. Suggestive of significant aortic stenosis.     After procedure a 29 mm Evolut valve placed with significant peri-valvular leak adjacent the the left cusp.     At post deployment hypotension, no significant pericardial effusion.     After post dilation, ventricular placement without SAM or LVOT obstruction. Trace PVL adjacent to the intervalvular fibrosa. Mean gradient 3 mm Hg, Peak gradient 5 mm Hg, LVOT aliasing noted; EOA and DVI not recorded for this reason. Echo findings are consistent with normal structure and function of the aortic valve prosthesis.  2. Left ventricular ejection fraction, by estimation, is 65 to 70%. The left ventricle has normal function. There is severe asymmetric left ventricular hypertrophy of the septal segment.  3. The mitral valve is degenerative. Comparison(s): Prior images reviewed side by side. Successful valve placement. FINDINGS  Left Ventricle: Left ventricular  ejection fraction, by estimation, is 65 to 70%. The left ventricle has normal function. There is severe asymmetric left ventricular hypertrophy of the septal segment. Mitral Valve: The mitral valve is degenerative in appearance. Tricuspid Valve: The tricuspid valve is normal in structure. Tricuspid valve regurgitation is trivial. No evidence of tricuspid stenosis. Aortic  Valve: Prior to procedure, calcified aortic valve. No AI. LVOT Doppler aliasing precludes accurate pre-procedural DVI/AVA. Mean gradient 36 mm Hg, Peak gradient 52 mm Hg. Suggestive of significant aortic stenosis. After procedure a 29 mm Evolut valve placed with significant peri-valvular leak adjacent the the left cusp. At post deployment hypotension, no significant pericardial effusion. After post dilation, ventricular placement without SAM or LVOT obstruction. Trace PVL adjacent to the intervalvular fibrosa. Mean gradient 3 mm Hg, Peak gradient 5 mm Hg, LVOT aliasing noted; EOA and DVI not recorded for this reason. Aortic valve mean gradient measures 3.0 mmHg. Aortic valve peak gradient measures 5.3 mmHg. Aortic valve area, by VTI measures 2.63 cm. Echo findings are consistent with normal structure and function of the aortic valve prosthesis. Additional Comments: Spectral Doppler performed. Color Doppler performed.  LEFT VENTRICLE PLAX 2D LVOT diam:     1.90 cm LV SV:         62 LV SV Index:   31 LVOT Area:     2.84 cm  AORTIC VALVE AV Area (Vmax):    2.39 cm AV Area (Vmean):   2.24 cm AV Area (VTI):     2.63 cm AV Vmax:           115.50 cm/s AV Vmean:          80.900 cm/s AV VTI:            0.234 m AV Peak Grad:      5.3 mmHg AV Mean Grad:      3.0 mmHg LVOT Vmax:         97.40 cm/s LVOT Vmean:        63.900 cm/s LVOT VTI:          0.217 m LVOT/AV VTI ratio: 0.93 TRICUSPID VALVE TR Peak grad:   37.0 mmHg TR Vmax:        304.00 cm/s  SHUNTS Systemic VTI:  0.22 m Systemic Diam: 1.90 cm Riley Lam MD Electronically signed by Riley Lam MD Signature Date/Time: 10/11/2023/6:16:17 PM    Final    Structural Heart Procedure Result Date: 10/11/2023 See surgical note for result.   Cardiac Studies   TAVR OPERATIVE NOTE     Date of Procedure:                10/11/2023    Preoperative Diagnosis:      Severe Aortic Stenosis    Postoperative Diagnosis:    Same    Procedure:        Transcatheter Aortic Valve Replacement - Percutaneous Transfemoral Approach             Medtronic Evolut Fx+ (size 29 mm, serial # [id number] )              Co-Surgeons:                        Alleen Borne, MD and Tonny Bollman, MD   Anesthesiologist:                  Shona Simpson, MD   Echocardiographer:              Riley Lam, MD   Pre-operative Echo Findings: Severe aortic stenosis Normal left ventricular systolic function   Post-operative Echo Findings: Trace paravalvular leak Normal/unchanged left ventricular systolic function  Patient Profile     BRYANT LIPPS is a 83 y.o. female with a history of right breast cancer (s/p lumpectomy,  radiation, and antiestrogen therapy), CAD s/p previous PCI to RCA and LCx (2015), HTN, HLD, CKD stage IIIa, morbid obesity (BMI 38), RBBB, and severe AS who [facility] 10[facility]23 for planned TAVR.   Assessment & Plan   Severe AS:  -- S/p successful TAVR with a 29 mm Medtronic Evolut FX THV via the TF approach on 10/11/23.  -- Post operative echo to be completed today.  -- Groin sites are stable.  -- Continue Asprin 81mg  daily.   Chest pain:  -- ECG with no acute ST/TW changes.  -- Pain with palpation and referred from right internal jugular temp wire.  -- Pt complains of reflux symptoms as well. -- Treat with pain medication and pantoprazole.   Hypotension:  -- Patient requiring low dose Levophed. Continue to titrate as needed.  -- Drops pressure when temp wire turned down.  -- Will need pacer. NPO today.  CHB: -- Pt with underlying RBBB and developed CHB after  TAVR. -- She remains pacer dependant with underlying CHB.  -- Will need permanent pacing, EP consulted.   CAD: -- Having chest pain that does not sound like angina.  -- Continue medical management.  -- Continue Asprin 81mg  daily.  -- Continue Crestor 20mg  daily.     SignedCline Crock, PA-C  10/12/2023, 8:40 AM  Pager [phone]  Patient seen, examined. Available data reviewed. Agree with findings, assessment, and plan as outlined by Carlean Jews, PA-C.  Patient seen earlier this morning.  On my exam, she was awake, alert, oriented, in no distress but complaining of dry mouth.  HEENT is normal, JVP is normal, lungs are clear bilaterally, heart is regular rate and rhythm with no murmur or gallop, abdomen is soft and nontender, bilateral groin sites are clear with no hematoma or ecchymoses, lower extremities have no edema.  Telemetry shows ventricular pacing at 80 bpm.  At the bedside, I reduced her pacemaker rate down to 35 bpm and she is clearly demonstrated to be in complete AV block and pacemaker dependent.  Appreciate EP evaluation with plans for permanent pacemaker later today.  I reviewed her postoperative day #1 echo completed this morning and it demonstrates vigorous LV function with LVEF 65 to 70%, small pericardial effusion not significantly changed from baseline, normal function of her TAVR prosthesis with mean transvalvular gradient 9 mmHg and trivial paravalvular leak.  Patient otherwise clinically stable, plans as outlined above.  Tonny Bollman, M.D. 10/12/2023 5:04 PM

## 2023-10-13 ENCOUNTER — Other Ambulatory Visit: Payer: Self-pay

## 2023-10-13 ENCOUNTER — Inpatient Hospital Stay (HOSPITAL_COMMUNITY)

## 2023-10-13 ENCOUNTER — Encounter (HOSPITAL_COMMUNITY): Payer: Self-pay | Admitting: Cardiology

## 2023-10-13 DIAGNOSIS — I350 Nonrheumatic aortic (valve) stenosis: Secondary | ICD-10-CM | POA: Diagnosis not present

## 2023-10-13 LAB — BASIC METABOLIC PANEL WITH GFR
Anion gap: 10 (ref 5–15)
BUN: 10 mg/dL (ref 8–23)
CO2: 22 mmol/L (ref 22–32)
Calcium: 8.4 mg/dL — ABNORMAL LOW (ref 8.9–10.3)
Chloride: 105 mmol/L (ref 98–111)
Creatinine, Ser: 0.8 mg/dL (ref 0.44–1.00)
GFR, Estimated: >60 mL/min (ref >60)
Glucose, Bld: 100 mg/dL — ABNORMAL HIGH (ref 70–99)
Potassium: 3.8 mmol/L (ref 3.5–5.1)
Sodium: 137 mmol/L (ref 135–145)

## 2023-10-13 LAB — CBC
HCT: 41.3 % (ref 36.0–46.0)
Hemoglobin: 14.3 g/dL (ref 12.0–15.0)
MCH: 27.3 pg (ref 26.0–34.0)
MCHC: 34.6 g/dL (ref 30.0–36.0)
MCV: 79 fL — ABNORMAL LOW (ref 80.0–100.0)
Platelets: 82 10*3/uL — ABNORMAL LOW (ref 150–400)
RBC: 5.23 MIL/uL — ABNORMAL HIGH (ref 3.87–5.11)
RDW: 15.3 % (ref 11.5–15.5)
WBC: 13.5 10*3/uL — ABNORMAL HIGH (ref 4.0–10.5)
nRBC: 0 % (ref 0.0–0.2)

## 2023-10-13 MED ORDER — METOPROLOL SUCCINATE ER 50 MG PO TB24
50.0000 mg | ORAL_TABLET | Freq: Every day | ORAL | Status: DC
Start: 2023-10-14 — End: 2023-10-14
  Administered 2023-10-14: 50 mg via ORAL
  Filled 2023-10-13: qty 1

## 2023-10-13 MED ORDER — METOPROLOL SUCCINATE ER 25 MG PO TB24
25.0000 mg | ORAL_TABLET | Freq: Every day | ORAL | Status: DC
  Administered 2023-10-13: 25 mg via ORAL
  Filled 2023-10-13: qty 1

## 2023-10-13 MED FILL — Vancomycin HCl IV Soln 1000 MG/200ML (Base Equivalent): INTRAVENOUS | Qty: 200 | Status: AC

## 2023-10-13 MED FILL — Midazolam HCl Inj 2 MG/2ML (Base Equivalent): INTRAMUSCULAR | Qty: 2 | Status: AC

## 2023-10-13 NOTE — Progress Notes (Addendum)
 Patient Name: Carolyn Rowland Date of Encounter: 10/13/2023  Primary Cardiologist: Rinaldo Cloud, MD Electrophysiologist: None  Interval Summary   S/p DDD PPM 4/9 by Dr. Jimmey Ralph.   Had episode of tachycardia this am initially thought to represent PMT but more likely AT vs slow AFL on device interrogation.   Felt tachy-palpitations during  Vital Signs    Vitals:   10/13/23 0500 10/13/23 0600 10/13/23 0700 10/13/23 0800  BP: (!) 139/59 (!) 122/53 (!) 110/59 92/82  Pulse: 96 90 95 92  Resp: 19 17 (!) 37 (!) 26  Temp:   (!) 97.5 F (36.4 C)   TempSrc:   Oral   SpO2: 95% 96% 95% 96%  Weight: 94.7 kg     Height:        Intake/Output Summary (Last 24 hours) at 10/13/2023 0929 Last data filed at 10/13/2023 0800 Gross per 24 hour  Intake 581.07 ml  Output 500 ml  Net 81.07 ml   Filed Weights   10/11/23 1109 10/12/23 0500 10/13/23 0500  Weight: 90.7 kg 100.1 kg 94.7 kg    Physical Exam    GEN- The patient is elderly appearing, alert and oriented x 3 today.   Lungs- Clear to ausculation bilaterally, normal work of breathing Cardiac- Regular rate and rhythm, no murmurs, rubs or gallops GI- soft, NT, ND, + BS Extremities- no clubbing or cyanosis. No edema  Telemetry    NSR 90s, earlier had either sinus tachy or atrial tach up into 120s (personally reviewed)  Hospital Course    HALIE GASS is a 83 y.o. female admitted for planned TAVR who had intra and post op CHB requiring pacing.   Assessment & Plan    CHB s/p TAVR S/p MDT DDD PPM 4/9 by Dr. Jimmey Ralph CXR and device interrogation stable.  Wound care and arm restriction reviewed and placed in AVS.  Usual follow up in place.   AT vs Sinus tach Resume BB and follow  EP will see as needed while remains here. OK for home from our perspective.   For questions or updates, please contact [facility] Please consult [email or web address] for contact info under Cardiology/STEMI.  Signed, Graciella Freer, PA-C   10/13/2023, 9:29 AM   I have seen, examined the patient, and reviewed the above assessment and plan.    Interval: Patient underwent successful dual-chamber pacemaker implantation yesterday evening.  No acute overnight events.  Patient reports doing relatively well this morning.  With no new or acute complaints today.  GEN: No acute distress.   Cardiac: Normal rate, regular rhythm.  Left chest pocket without hematoma. Resp: Normal work of breathing. Ext: No edema. Psych: Normal affect    Assessment and Plan: Carolyn Rowland is an 83 year old female with a pre-existing right bundle branch block who underwent TAVR implantation with a Medtronic Evolut valve which was complicated by complete heart block.  She was seen this morning remained pacemaker dependent.  She had no underlying conduction at VVI 30 bpm nor did she have a reliable escape rhythm.   #.  Complete heart block: Had pre-existing right bundle branch block prior to TAVR with self-expanding Medtronic Evolut valve.   #. S/p dual-chamber pacemaker implant on 10/12/2023: -Chest x-ray with appropriate lead position and no pneumothorax. -Bedside device check was performed with appropriate device function and stable lead parameters. -Usual discharge teaching regarding activity restrictions and wound care provided.  #.  Nonsustained atrial tachycardia: -We will resume patient's home metoprolol.  Carolyn Putnam, MD 10/13/2023 10:39  PM

## 2023-10-13 NOTE — Progress Notes (Signed)
 CARDIAC REHAB PHASE I    Pt resting in bed, feeling well today. Post TAVR education including heart healthy diet, exercise guidelines, site care, exercise guidelines and CRP2 reviewed. Referral for CRP2 sent to [facility].   [id number] Woodroe Chen, RN BSN 10/13/2023 11:40 AM

## 2023-10-13 NOTE — Progress Notes (Signed)
 Patient arrived at the unit from [facility] bath given,vitals checked,CCMD notified,pt oriented to the unit,call bell in reach

## 2023-10-13 NOTE — Progress Notes (Addendum)
 [facility]   MULTIDISCIPLINARY HEART VALVE TEAM  Patient Name: Carolyn Rowland Date of Encounter: 10/13/2023  Admit date: 10/11/2023  Primary Care Provider: Estevan Oaks, NP [facility] Cardiologist: Rinaldo Cloud, MD  [facility] Electrophysiologist:  None   Hospital Problem List     Principal Problem:   S/P TAVR (transcatheter aortic valve replacement) Active Problems:   Coronary atherosclerosis of native coronary artery   Breast cancer of upper-outer quadrant of right female breast [facility])   Spinal stenosis of lumbosacral region   Aortic stenosis, severe   Hypertension   High cholesterol   CHB (complete heart block) (HCC)    Subjective   Feels heart racing when HR elevated. Doesn't want to go home today.    Inpatient Medications    Scheduled Meds:  aspirin EC  81 mg Oral Daily   Chlorhexidine Gluconate Cloth  6 each Topical Daily   metoprolol succinate  25 mg Oral Daily   pantoprazole (PROTONIX) IV  40 mg Intravenous Q12H   rosuvastatin  20 mg Oral Daily   sodium chloride flush  3 mL Intravenous Q12H   Continuous Infusions:  sodium chloride 10 mL/hr at 10/13/23 0800   nitroGLYCERIN     norepinephrine (LEVOPHED) Adult infusion Stopped (10/12/23 1609)   PRN Meds: sodium chloride, acetaminophen **OR** acetaminophen, morphine injection, ondansetron (ZOFRAN) IV, oxyCODONE, sodium chloride flush, traMADol   Vital Signs    Vitals:   10/13/23 0600 10/13/23 0700 10/13/23 0800 10/13/23 1056  BP: (!) 122/53 (!) 110/59 92/82 (!) 118/55  Pulse: 90 95 92 88  Resp: 17 (!) 37 (!) 26   Temp:  (!) 97.5 F (36.4 C)    TempSrc:  Oral    SpO2: 96% 95% 96%   Weight:      Height:        Intake/Output Summary (Last 24 hours) at 10/13/2023 1127 Last data filed at 10/13/2023 0800 Gross per 24 hour  Intake 573.6 ml  Output 500 ml  Net 73.6 ml   Filed Weights   10/11/23 1109 10/12/23 0500 10/13/23 0500  Weight: 90.7 kg 100.1 kg 94.7 kg     Physical Exam   GEN: Well nourished HEENT: Grossly normal.  Neck: no JVD Cardiac: tachy, no murmurs, rubs, or gallops. No clubbing, cyanosis, edema.   Respiratory:  Respirations regular and unlabored, clear to auscultation bilaterally. GI: Soft, nontender, nondistended, BS + x 4. MS: no deformity or atrophy. Skin: warm and dry, no rash.  Groin sites clear without hematoma or ecchymosis  Neuro:  Strength and sensation are intact. Psych: AAOx3.  Normal affect.  Labs    CBC Recent Labs    10/12/23 0452 10/13/23 0459  WBC 7.4 13.5*  HGB 14.6 14.3  HCT 42.1 41.3  MCV 79.4* 79.0*  PLT 124* 82*   Basic Metabolic Panel Recent Labs    16/10/96 0452 10/13/23 0459  NA 138 137  K 4.0 3.8  CL 108 105  CO2 21* 22  GLUCOSE 99 100*  BUN 10 10  CREATININE 0.58 0.80  CALCIUM 8.4* 8.4*  MG 1.8  --     Telemetry    Pacing at 130 bpm. - Personally Reviewed  ECG    A sensed V paced HR 99.  - Personally Reviewed  Cardiac Studies   TAVR OPERATIVE NOTE     Date of Procedure:                10/11/2023    Preoperative Diagnosis:  Severe Aortic Stenosis    Postoperative Diagnosis:    Same    Procedure:        Transcatheter Aortic Valve Replacement - Percutaneous Transfemoral Approach             Medtronic Evolut Fx+ (size 29 mm, serial # [id number] )              Co-Surgeons:                        Alleen Borne, MD and Tonny Bollman, MD   Anesthesiologist:                  Shona Simpson, MD   Echocardiographer:              Riley Lam, MD   Pre-operative Echo Findings: Severe aortic stenosis Normal left ventricular systolic function   Post-operative Echo Findings: Trace paravalvular leak Normal/unchanged left ventricular systolic function  _______________________  Echo 10/12/23 IMPRESSIONS   1. Left ventricular ejection fraction, by estimation, is 65 to 70%. The  left ventricle has normal function. The left ventricle has no regional  wall  motion abnormalities. There is severe left ventricular hypertrophy.  Left ventricular diastolic function  could not be evaluated.   2. Right ventricular systolic function is normal. The right ventricular  size is normal.   3. A small pericardial effusion is present. There is no evidence of  cardiac tamponade.   4. The mitral valve is grossly normal. Trivial mitral valve  regurgitation. No evidence of mitral stenosis.   5. The aortic valve has been repaired/replaced. Aortic valve  regurgitation is trivial. There is a 29 mm Medtronic bioprosthetic valve  present in the aortic position. Procedure Date: 10/11/23. Echo findings are  consistent with normal structure and function   of the aortic valve prosthesis. Aortic valve area, by VTI measures 1.45  cm. Aortic valve mean gradient measures 9.0 mmHg. Aortic valve Vmax  measures 1.96 m/s.   Comparison(s): Prior images reviewed side by side.   Conclusion(s)/Recommendation(s): Currently paced post TAVR. TAVR well  seated with trivial perivalvular leak. Mean gradient 9 mmHg.   Patient Profile     Carolyn Rowland is a 83 y.o. female with a history of right breast cancer (s/p lumpectomy, radiation, and antiestrogen therapy), CAD s/p previous PCI to RCA and LCx (2015), HTN, HLD, CKD stage IIIa, morbid obesity (BMI 38), RBBB, and severe AS who presented to [facility] on 10/11/23 for planned TAVR.   Assessment & Plan   Severe AS:  -- S/p successful TAVR with a 29 mm Medtronic Evolut FX THV via the TF approach on 10/11/23.  -- Post operative echo showed EF 65%, severe LVH, normally functioning TAVR with a mean gradient of 9 mmHg and trivial PVL as well as a small pericardial effusion.  -- Groin sites are stable.  -- Continue Asprin 81mg  daily.  -- Plan to transfer to floor today with discharge home tomorrow AM.   HTN:  -- Initially hypotensive requiring Levophed. Now off all drips.  -- Resume home antihypertensives as needed.   CHB: -- Pt with underlying  RBBB and developed CHB after TAVR. -- S/p Medtronic dual chamber pacemaker implant with LBBAP lead by Dr. Jimmey Ralph on 10/12/23.  Atrial tach:  -- Had symptomatic palpitations this morning and pacing at 130 bpm. -- Device interrogation showed atrial tach.  -- Resumed on home Toprol XL 50mg  daily.   CAD: -- Had chest pain  not c/w with angina.   -- Continue medical management.  -- Continue Asprin 81mg  daily.  -- Continue Crestor 20mg  daily.   Thrombocytopenia: -- Plts down to 82. Likely reactive.  -- Continue to monitor.   Byrd Hesselbach, PA-C  10/13/2023, 11:27 AM  Pager [phone]  Patient seen, examined. Available data reviewed. Agree with findings, assessment, and plan as outlined by Carlean Jews, PA-C.  The patient is independently interviewed and examined.  She is alert, oriented, in no distress.  HEENT is normal, JVP is normal, lungs are clear bilaterally, heart is regular rate and rhythm with a 2/6 ejection murmur at the right upper sternal border, abdomen soft nontender, bilateral groin sites are clear, lower extremities have no edema.  The right internal jugular sheath has been removed and there is no hematoma.  The left chest pacemaker site is intact and there is no erythema or hematoma present.  The patient had symptomatic atrial tachycardia this morning with her pacemaker tracking her atrial rate at 130 bpm.  She has been started back on her beta-blocker and device interrogation has been done with adjustments made.  Thrombocytopenia noted which is common following TAVR with no overt bleeding problems.  Patient will continue on aspirin 81 mg daily, transfer to the floor today for telemetry monitoring, with tentative plans to discharge home tomorrow.  Otherwise, as outlined above.  Tonny Bollman, M.D. 10/13/2023 1:07 PM

## 2023-10-14 ENCOUNTER — Other Ambulatory Visit: Payer: Self-pay | Admitting: Cardiology

## 2023-10-14 DIAGNOSIS — D649 Anemia, unspecified: Secondary | ICD-10-CM

## 2023-10-14 LAB — BASIC METABOLIC PANEL WITH GFR
Anion gap: 5 (ref 5–15)
BUN: 13 mg/dL (ref 8–23)
CO2: 28 mmol/L (ref 22–32)
Calcium: 8.4 mg/dL — ABNORMAL LOW (ref 8.9–10.3)
Chloride: 106 mmol/L (ref 98–111)
Creatinine, Ser: 0.92 mg/dL (ref 0.44–1.00)
GFR, Estimated: >60 mL/min (ref >60)
Glucose, Bld: 107 mg/dL — ABNORMAL HIGH (ref 70–99)
Potassium: 4.3 mmol/L (ref 3.5–5.1)
Sodium: 139 mmol/L (ref 135–145)

## 2023-10-14 LAB — CBC
HCT: 38.9 % (ref 36.0–46.0)
Hemoglobin: 13.6 g/dL (ref 12.0–15.0)
MCH: 27.6 pg (ref 26.0–34.0)
MCHC: 35 g/dL (ref 30.0–36.0)
MCV: 79.1 fL — ABNORMAL LOW (ref 80.0–100.0)
Platelets: 62 10*3/uL — ABNORMAL LOW (ref 150–400)
RBC: 4.92 MIL/uL (ref 3.87–5.11)
RDW: 15.2 % (ref 11.5–15.5)
WBC: 10 10*3/uL (ref 4.0–10.5)
nRBC: 0 % (ref 0.0–0.2)

## 2023-10-14 MED ORDER — PANTOPRAZOLE SODIUM 40 MG PO TBEC
40.0000 mg | DELAYED_RELEASE_TABLET | Freq: Two times a day (BID) | ORAL | Status: DC
  Administered 2023-10-14: 40 mg via ORAL
  Filled 2023-10-14: qty 1

## 2023-10-14 NOTE — Progress Notes (Signed)
 Discharge instructions reviewed with pt.  Copy of instructions given to pt. No changes in medications, no new scripts.  Discussed instructions with pt for post TAVR and post pacemaker placement at length with pt, all instructions in her discharge instructions. Pt assisted getting dressed. Pt going to go to the discharge lounge while she waits for her daughter, she is to come between 5:30-6:00pm.  Pt will be d/c'd via wheelchair with belongings, and will be escorted by staff.   Annice Needy, RN SWOT

## 2023-10-14 NOTE — Care Management Important Message (Signed)
 Important Message  Patient Details  Name: Carolyn Rowland MRN: [id number] Date of Birth: 08-Mar-1941   Important Message Given:  Yes - Medicare IM     Dorena Bodo 10/14/2023, 4:26 PM

## 2023-10-14 NOTE — Plan of Care (Signed)
  Problem: Education: Goal: Knowledge of General Education information will improve Description: Including pain rating scale, medication(s)/side effects and non-pharmacologic comfort measures Outcome: Not Progressing   Problem: Health Behavior/Discharge Planning: Goal: Ability to manage health-related needs will improve Outcome: Not Progressing   Problem: Clinical Measurements: Goal: Ability to maintain clinical measurements within normal limits will improve Outcome: Not Progressing Goal: Will remain free from infection Outcome: Not Progressing Goal: Diagnostic test results will improve Outcome: Not Progressing Goal: Respiratory complications will improve Outcome: Not Progressing Goal: Cardiovascular complication will be avoided Outcome: Not Progressing   Problem: Activity: Goal: Risk for activity intolerance will decrease Outcome: Not Progressing   Problem: Nutrition: Goal: Adequate nutrition will be maintained Outcome: Not Progressing   Problem: Coping: Goal: Level of anxiety will decrease Outcome: Not Progressing   Problem: Elimination: Goal: Will not experience complications related to bowel motility Outcome: Not Progressing Goal: Will not experience complications related to urinary retention Outcome: Not Progressing   Problem: Pain Managment: Goal: General experience of comfort will improve and/or be controlled Outcome: Not Progressing   Problem: Safety: Goal: Ability to remain free from injury will improve Outcome: Not Progressing   Problem: Skin Integrity: Goal: Risk for impaired skin integrity will decrease Outcome: Not Progressing   Problem: Education: Goal: Knowledge of cardiac device and self-care will improve Outcome: Not Progressing Goal: Ability to safely manage health related needs after discharge will improve Outcome: Not Progressing Goal: Individualized Educational Video(s) Outcome: Not Progressing   Problem: Cardiac: Goal: Ability to  achieve and maintain adequate cardiopulmonary perfusion will improve Outcome: Not Progressing

## 2023-10-14 NOTE — Progress Notes (Addendum)
 Mobility Specialist Progress Note:    10/14/23 0930  Mobility  Activity Ambulated with assistance in room;Ambulated with assistance in hallway  Level of Assistance Minimal assist, patient does 75% or more  Assistive Device Front wheel walker  Distance Ambulated (ft) 140 ft  LUE Weight Bearing Per Provider Order NWB  Activity Response Tolerated well  Mobility Referral Yes  Mobility visit 1 Mobility  Mobility Specialist Start Time (ACUTE ONLY) 0930  Mobility Specialist Stop Time (ACUTE ONLY) 0941  Mobility Specialist Time Calculation (min) (ACUTE ONLY) 11 min   Pt received in bed, agreeable to mobility session. Ambulated in room and in hallway. MinA to stand, CGA for ambulation. Tolerated well, VSS throughout. Pt c/o groin soreness, rated 1/10.  Returned pt to room, sitting up in chair comfortably. All needs met, call bell in reach.    Feliciana Rossetti Mobility Specialist Please contact via Special educational needs teacher or  Rehab office at [phone]

## 2023-10-17 ENCOUNTER — Telehealth: Payer: Self-pay

## 2023-10-17 MED FILL — Cefazolin Sodium-Dextrose IV Solution 2 GM/100ML-4%: INTRAVENOUS | Qty: 100 | Status: AC

## 2023-10-17 NOTE — Telephone Encounter (Signed)
 Patient contacted regarding discharge from [facility] on 10/14/2023.  Patient understands to follow up with provider Jeronimo Moors PA-C on 10/26/2023 at 3:35 PM at [facility] location . Patient understands discharge instructions? yes Patient understands medications and regiment? yes Patient understands to bring all medications to this visit? Yes  The patient is very anxious that she now has a pacemaker.  I reminded the patient of wound check appointment on 4/16 and that she needs to go to [facility] after that visit for a CBC. I discussed her post pacemaker activity restrictions and she has her paperwork available for further review.

## 2023-10-18 ENCOUNTER — Telehealth: Payer: Self-pay

## 2023-10-18 NOTE — Telephone Encounter (Signed)
 I spoke with the pt and transmission received. I let her know that she should not lift, pull or push anything over 10 lbs for 6 weeks after implant.

## 2023-10-19 ENCOUNTER — Ambulatory Visit: Attending: Cardiovascular Disease

## 2023-10-19 DIAGNOSIS — I442 Atrioventricular block, complete: Secondary | ICD-10-CM | POA: Diagnosis not present

## 2023-10-19 LAB — CUP PACEART INCLINIC DEVICE CHECK
Battery Remaining Longevity: 146 mo
Battery Voltage: 3.22 V
Brady Statistic AP VP Percent: 0.39 %
Brady Statistic AP VS Percent: 0 %
Brady Statistic AS VP Percent: 99.48 %
Brady Statistic AS VS Percent: 0.12 %
Brady Statistic RA Percent Paced: 0.41 %
Brady Statistic RV Percent Paced: 99.87 %
Date Time Interrogation Session: 20250416174657
Implantable Lead Connection Status: 753985
Implantable Lead Connection Status: 753985
Implantable Lead Implant Date: 20250409
Implantable Lead Implant Date: 20250409
Implantable Lead Location: 753858
Implantable Lead Location: 753859
Implantable Lead Model: 3830
Implantable Lead Model: 5076
Implantable Pulse Generator Implant Date: 20250409
Lead Channel Impedance Value: 304 Ohm
Lead Channel Impedance Value: 399 Ohm
Lead Channel Impedance Value: 475 Ohm
Lead Channel Impedance Value: 722 Ohm
Lead Channel Pacing Threshold Amplitude: 0.625 V
Lead Channel Pacing Threshold Amplitude: 0.875 V
Lead Channel Pacing Threshold Pulse Width: 0.4 ms
Lead Channel Pacing Threshold Pulse Width: 0.4 ms
Lead Channel Sensing Intrinsic Amplitude: 2.375 mV
Lead Channel Sensing Intrinsic Amplitude: 29.5 mV
Lead Channel Sensing Intrinsic Amplitude: 3 mV
Lead Channel Sensing Intrinsic Amplitude: 30.75 mV
Lead Channel Setting Pacing Amplitude: 3.5 V
Lead Channel Setting Pacing Amplitude: 3.5 V
Lead Channel Setting Pacing Pulse Width: 0.4 ms
Lead Channel Setting Sensing Sensitivity: 1.2 mV
Zone Setting Status: 755011

## 2023-10-19 NOTE — Patient Instructions (Signed)

## 2023-10-19 NOTE — Progress Notes (Signed)
 Normal dual chamber pacemaker wound check. Presenting rhythm: AS/VP 86 . Wound well healed. Routine testing performed. Thresholds, sensing, and impedances consistent with implant measurements and at 3.5V safety margin/auto capture until 3 month visit. No episodes. Reviewed arm restrictions to continue for 6 weeks total post op.  Pt enrolled in remote follow-up.

## 2023-10-24 ENCOUNTER — Telehealth: Payer: Self-pay

## 2023-10-24 NOTE — Telephone Encounter (Signed)
 The pt called into the office with complaints of burning with urination, bump on labia and possible yeast infection.  The patient noticed symptoms since she left the hospital but they worsened over the weekend.  The pt also thinks she has a cut inside of genitals.  The pt did have a PureWick placed at the time of TAVR and she wondered if this irritated her private area. I advised the pt to contact her PCP today for further evaluation. Pt agreed with plan.

## 2023-10-25 ENCOUNTER — Other Ambulatory Visit: Payer: Self-pay | Admitting: Physician Assistant

## 2023-10-25 ENCOUNTER — Telehealth (HOSPITAL_COMMUNITY): Payer: Self-pay

## 2023-10-25 DIAGNOSIS — D649 Anemia, unspecified: Secondary | ICD-10-CM

## 2023-10-25 NOTE — Telephone Encounter (Signed)
 Called patient to see if she is interested in the Cardiac Rehab Program. Patient expressed interest. Explained scheduling process and went over insurance, patient verbalized understanding. Will contact patient for scheduling once f/u has been completed.

## 2023-10-25 NOTE — Telephone Encounter (Signed)
 Pt insurance is active and benefits verified through [facility] Medicare. Co-pay $0.00, DED $0.00/$0.00 met, out of pocket $3,900.00/$0.00 met, co-insurance 0%. No pre-authorization required. Passport, 10/25/23 @ 2:19PM, REF#[id number]   How many CR sessions are covered? (36 visits for TCR, 72 visits for ICR)72 Is this a lifetime maximum or an annual maximum? Annual Has the member used any of these services to date? No Is there a time limit (weeks/months) on start of program and/or program completion? No     Will contact patient to see if she is interested in the Cardiac Rehab Program. If interested, patient will need to complete follow up appt. Once completed, patient will be contacted for scheduling upon review by the RN Navigator.

## 2023-10-26 ENCOUNTER — Encounter (HOSPITAL_COMMUNITY): Payer: Self-pay

## 2023-10-26 ENCOUNTER — Ambulatory Visit: Attending: Internal Medicine | Admitting: Physician Assistant

## 2023-10-26 ENCOUNTER — Telehealth (HOSPITAL_COMMUNITY): Payer: Self-pay

## 2023-10-26 ENCOUNTER — Ambulatory Visit (HOSPITAL_COMMUNITY)
Admission: EM | Admit: 2023-10-26 | Discharge: 2023-10-26 | Disposition: A | Discharge status: Home / Self Care | Attending: Family Medicine | Admitting: Family Medicine

## 2023-10-26 VITALS — BP 122/70 | HR 104 | Resp 16 | Ht 65.0 in | Wt 202.6 lb

## 2023-10-26 DIAGNOSIS — I2583 Coronary atherosclerosis due to lipid rich plaque: Secondary | ICD-10-CM

## 2023-10-26 DIAGNOSIS — Z950 Presence of cardiac pacemaker: Secondary | ICD-10-CM

## 2023-10-26 DIAGNOSIS — I2510 Atherosclerotic heart disease of native coronary artery without angina pectoris: Secondary | ICD-10-CM

## 2023-10-26 DIAGNOSIS — R911 Solitary pulmonary nodule: Secondary | ICD-10-CM

## 2023-10-26 DIAGNOSIS — D696 Thrombocytopenia, unspecified: Secondary | ICD-10-CM

## 2023-10-26 DIAGNOSIS — L0291 Cutaneous abscess, unspecified: Secondary | ICD-10-CM

## 2023-10-26 DIAGNOSIS — Z952 Presence of prosthetic heart valve: Secondary | ICD-10-CM

## 2023-10-26 DIAGNOSIS — I10 Essential (primary) hypertension: Secondary | ICD-10-CM

## 2023-10-26 DIAGNOSIS — Z853 Personal history of malignant neoplasm of breast: Secondary | ICD-10-CM

## 2023-10-26 MED ORDER — LIDOCAINE HCL (PF) 1 % IJ SOLN
INTRAMUSCULAR | Status: AC
  Filled 2023-10-26: qty 30

## 2023-10-26 MED ORDER — SULFAMETHOXAZOLE-TRIMETHOPRIM 800-160 MG PO TABS: 1.0000 | ORAL_TABLET | Freq: Two times a day (BID) | ORAL | 0 refills | Status: AC

## 2023-10-26 MED ORDER — AZITHROMYCIN 500 MG PO TABS: 500.0000 mg | ORAL_TABLET | ORAL | 12 refills | Status: AC

## 2023-10-26 NOTE — Patient Instructions (Signed)
 Medication Instructions:  Your physician has recommended you make the following change in your medication:  Start ZITHROMAX  500 mg 1 hour prior to dental procedures and cleanings.    *If you need a refill on your cardiac medications before your next appointment, please call your pharmacy*  Lab Work: NONE If you have labs (blood work) drawn today and your tests are completely normal, you will receive your results only by: MyChart Message (if you have MyChart) OR A paper copy in the mail If you have any lab test that is abnormal or we need to change your treatment, we will call you to review the results.  Testing/Procedures: NONE  Follow-Up: At [facility], you and your health needs are our priority.  As part of our continuing mission to provide you with exceptional heart care, our providers are all part of one team.  This team includes your primary Cardiologist (physician) and Advanced Practice Providers or APPs (Physician Assistants and Nurse Practitioners) who all work together to provide you with the care you need, when you need it.  Your next appointment:   KEEP SCHEDULED APPOINTMENT   We recommend signing up for the patient portal called "MyChart".  Sign up information is provided on this After Visit Summary.  MyChart is used to connect with patients for Virtual Visits (Telemedicine).  Patients are able to view lab/test results, encounter notes, upcoming appointments, etc.  Non-urgent messages can be sent to your provider as well.   To learn more about what you can do with MyChart, go to [email or web address].   Other Instructions       1st Floor: - Lobby - Registration  - Pharmacy  - Lab - Cafe  2nd Floor: - PV Lab - Diagnostic Testing (echo, CT, nuclear med)  3rd Floor: - Vacant  4th Floor: - TCTS (cardiothoracic surgery) - AFib Clinic - Structural Heart Clinic - Vascular Surgery  - Vascular Ultrasound  5th Floor: - HeartCare Cardiology (general  and EP) - Clinical Pharmacy for coumadin, hypertension, lipid, weight-loss medications, and med management appointments    Valet parking services will be available as well.

## 2023-10-26 NOTE — ED Triage Notes (Addendum)
 Patient presents to the office for vagina abscess x 4 days. Pt states she was admitted to the hospital to have her pace maker implant. Patient reports she feels dizzy this morning.

## 2023-10-26 NOTE — Progress Notes (Unsigned)
 [facility]   MULTIDISCIPLINARY HEART VALVE CLINIC                                     Cardiology Office Note:    Date:  10/27/2023   ID:  Carolyn Rowland, DOB [birth year], MRN [id number]  PCP:  Carolyn Cisco, NP  [facility] Cardiologist:  Chapman Commodore, MD  [facility] Structural heart: Arnoldo Lapping, MD [facility] Electrophysiologist:  None   Referring MD: Carolyn Cisco, NP   Chief Complaint  Patient presents with   s/p TAVR   Follow-up    History of Present Illness:    Carolyn Rowland is a 83 y.o. female with a hx of right breast cancer (s/p lumpectomy, radiation, and antiestrogen therapy), CAD s/p previous PCI to RCA and LCx (2015), HTN, HLD, CKD stage IIIa, morbid obesity (BMI 38), RBBB, and severe AS s/p TAVR (10/11/23) who presents to clinic for follow up.   She developed progressive dyspnea and dizziness. Echo 08/12/23 showed EF 60%, severe LVH, mild TR, and severe AS with mean grad 27.5 mmHg, AVA 0.70 cm2. [facility] 08/30/23 showed 10% prox Cx to Mid Cx stenosis, 80% 2nd Mrg stenosis, 10% ost RCA to mid RCA stenosis, 10% dist Cx stenosis and patent proximal to mid right coronary artery stents with minimal in-stent restenosis and widely patent proximal and distal left circumflex stents. There was a high-grade lesion of the ostium of the first obtuse marginal that should be treated medically and mild diffuse nonobstructive disease of the LAD. TAVR CTs showed an aortic valve calcium  score of 1766 c/w severe AS. S/p TAVR with a 29 mm Medtronic Evolut FX THV via the TF approach on 10/11/23. 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 Daneil Dunker on 10/12/23. She had symptomatic atrial tachycardia and resumed on home Torpol XL.   Today the patient presents to clinic for follow  up. Here alone. No CP or SOB. No LE edema, orthopnea or PND. No dizziness or syncope. No blood in stool or urine. No palpitations. Mostly struggling with a vaginal abscess that required draining and abx.    Past Medical History:  Diagnosis Date   Anemia    in younger years   Arthritis    "hands; legs" (08/17/2013)   Coronary artery disease    6 stents in 2015   GERD (gastroesophageal reflux disease)    H/O hiatal hernia    High cholesterol    Hx of adenomatous colonic polyps 08/15/2018   Hypertension    Radiation 11/28/14-01/02/15   Right breast 50 Gray   S/P TAVR (transcatheter aortic valve replacement)    Severe aortic stenosis    Vertigo      Current Medications: Current Meds  Medication Sig   amLODipine  (NORVASC ) 2.5 MG tablet Take 2.5 mg by mouth daily.   aspirin  81 MG EC tablet Take 81 mg by mouth daily.    azithromycin  (ZITHROMAX ) 500 MG tablet Take 1 tablet (500 mg total) by mouth as directed. Take one tablet 1 hour before any dental work including cleanings.   cholecalciferol  (VITAMIN D3) 25 MCG (1000 UNIT) tablet Take 1,000 Units by mouth daily.   isosorbide  mononitrate (IMDUR ) 60 MG 24  hr tablet Take 1 tablet (60 mg total) by mouth daily.   LINZESS 72 MCG capsule Take 72 mcg by mouth daily as needed (constipation).   metoprolol  succinate (TOPROL -XL) 50 MG 24 hr tablet Take 50 mg by mouth daily. Take with or immediately following a meal.   Multiple Minerals-Vitamins (CALCIUM  & VIT D3 BONE HEALTH PO) Take 1 tablet by mouth daily.   nitroGLYCERIN  (NITROSTAT ) 0.4 MG SL tablet Place 1 tablet (0.4 mg total) under the tongue every 5 (five) minutes as needed for chest pain.   omeprazole (PRILOSEC) 40 MG capsule Take 40 mg by mouth daily as needed (acid reflux).    rosuvastatin  (CRESTOR ) 20 MG tablet Take 20 mg by mouth daily.   spironolactone  (ALDACTONE ) 25 MG tablet Take 12.5 mg by mouth daily.   sulfamethoxazole -trimethoprim  (BACTRIM  DS) 800-160 MG tablet Take 1 tablet by  mouth 2 (two) times daily for 7 days.      ROS:   Please see the history of present illness.    All other systems reviewed and are negative.  EKGs   EKG Interpretation Date/Time:  Wednesday October 26 2023 15:38:40 EDT Ventricular Rate:  104 PR Interval:  214 QRS Duration:  92 QT Interval:  342 QTC Calculation: 449 R Axis:   47  Text Interpretation: Atrial-sensed ventricular-paced rhythm with prolonged AV conduction Confirmed by Abagail Hoar [phone]) on 10/26/2023 3:43:33 PM   Risk Assessment/Calculations:           Physical Exam:    VS:  BP 122/70 (BP Location: Left Arm, Patient Position: Sitting, Cuff Size: Normal)   Pulse (!) 104   Resp 16   Ht 5\' 5"  (1.651 m)   Wt 202 lb 9.6 oz (91.9 kg)   SpO2 94%   BMI 33.71 kg/m     Wt Readings from Last 3 Encounters:  10/26/23 202 lb 9.6 oz (91.9 kg)  10/13/23 208 lb 12.4 oz (94.7 kg)  09/29/23 202 lb (91.6 kg)     GEN: Well nourished, well developed in no acute distress NECK: No JVD CARDIAC: RRR, soft flow murmur. No rubs, gallops RESPIRATORY:  Clear to auscultation without rales, wheezing or rhonchi  ABDOMEN: Soft, non-tender, non-distended EXTREMITIES:  No edema; No deformity.  Groin sites clear without hematoma or ecchymosis.   ASSESSMENT:    1. S/P TAVR (transcatheter aortic valve replacement)   2. Pacemaker   3. Primary hypertension   4. Thrombocytopenia (HCC)   5. Coronary artery disease due to lipid rich plaque   6. Pulmonary nodule   7. History of breast cancer     PLAN:    In order of problems listed above:  Severe AS s/p TAVR:  -- Pt doing well s/p TAVR.  -- Groin sites healing well.  -- SBE prophylaxis discussed; I have RX'd azithromycin  due to a PCN allergy.   -- Continue Aspirin  81mg  daily. -- Cleared to resume all activities without restriction. -- I will see back for 1 month echo and OV.  CHB s/p PPM: -- Followed by Dr. Daneil Dunker.  HTN:  -- Bp well controlled today.  -- Continue  Toprol  XL 50mg  daily, Norvasc  2.5mg  daily, Imdur  60mg  daily and spironolactone  12.5mg  daily.    Thrombocytopenia: -- Plts down to 62,000. This is seen following TAVR typically without bleeding sequelae. -- Plan was for repeat CBC but pt went to [facility] and there was no order. Order has now been released and she will get this drawn.    CAD: -- No chest  pain. -- Continue medical management.  -- Continue Asprin 81mg  daily.  -- Continue Crestor  20mg  daily.    Pulmonary nodule: -- 5 mm pleural based right lower lobe pulmonary nodule. No follow-up needed if patient is low-risk (high risk given breast cancer history).  -- Will get repeat chest CT in 1 year given breast cancer history.    History of breast cancer: -- Pre TAVR CTs showed "asymmetric right breast tissue with stated late appearance of some of the breast tissues with associated biopsy marker adjacent to this. Finding could be due to prior treatment given MRI breast 08/20/2014 report. Recommend correlation with more recent diagnostic mammography." -- I will discuss this with Davied Estelle (Dr. Alix Aquas NP) and see if anything needs to be done about this.     Cardiac Rehabilitation Eligibility Assessment  The patient is ready to start cardiac rehabilitation from a cardiac standpoint.    Medication Adjustments/Labs and Tests Ordered: Current medicines are reviewed at length with the patient today.  Concerns regarding medicines are outlined above.  Orders Placed This Encounter  Procedures   EKG 12-Lead   Meds ordered this encounter  Medications   azithromycin  (ZITHROMAX ) 500 MG tablet    Sig: Take 1 tablet (500 mg total) by mouth as directed. Take one tablet 1 hour before any dental work including cleanings.    Dispense:  6 tablet    Refill:  12    Supervising Provider:   Arnoldo Lapping [[id number]]    Patient Instructions  Medication Instructions:  Your physician has recommended you make the following change in your  medication:  Start ZITHROMAX  500 mg 1 hour prior to dental procedures and cleanings.    *If you need a refill on your cardiac medications before your next appointment, please call your pharmacy*  Lab Work: NONE If you have labs (blood work) drawn today and your tests are completely normal, you will receive your results only by: MyChart Message (if you have MyChart) OR A paper copy in the mail If you have any lab test that is abnormal or we need to change your treatment, we will call you to review the results.  Testing/Procedures: NONE  Follow-Up: At [facility], you and your health needs are our priority.  As part of our continuing mission to provide you with exceptional heart care, our providers are all part of one team.  This team includes your primary Cardiologist (physician) and Advanced Practice Providers or APPs (Physician Assistants and Nurse Practitioners) who all work together to provide you with the care you need, when you need it.  Your next appointment:   KEEP SCHEDULED APPOINTMENT   We recommend signing up for the patient portal called "MyChart".  Sign up information is provided on this After Visit Summary.  MyChart is used to connect with patients for Virtual Visits (Telemedicine).  Patients are able to view lab/test results, encounter notes, upcoming appointments, etc.  Non-urgent messages can be sent to your provider as well.   To learn more about what you can do with MyChart, go to [email or web address].   Other Instructions       1st Floor: - Lobby - Registration  - Pharmacy  - Lab - Cafe  2nd Floor: - PV Lab - Diagnostic Testing (echo, CT, nuclear med)  3rd Floor: - Vacant  4th Floor: - TCTS (cardiothoracic surgery) - AFib Clinic - Structural Heart Clinic - Vascular Surgery  - Vascular Ultrasound  5th Floor: - HeartCare Cardiology (general and EP) - Clinical  Pharmacy for coumadin, hypertension, lipid, weight-loss medications, and med  management appointments    Valet parking services will be available as well.      Signed, Abagail Hoar, PA-C  10/27/2023 9:38 AM    [facility]

## 2023-10-26 NOTE — Discharge Instructions (Signed)
 We have drained the abscess here today. Recommend continue warm compresses to the area and warm soaks to promote more drainage. We are put in the also antibiotics for the infection. Take the antibiotics as prescribed. Follow-up for any continued issues

## 2023-10-26 NOTE — ED Provider Notes (Signed)
 [facility]    CSN: [id number] Arrival date & time: 10/26/23  0831      History   Chief Complaint Chief Complaint  Patient presents with   Abscess    HPI Carolyn Rowland is a 83 y.o. female.   Patient is a 83 year old female who presents today with abscess to right labial area.  This has been present for the past 4 days.  Reports that the areas been slightly draining and has odor.  Denies any fevers or chills.  Patient with recent pacemaker implant.  Feeling mildly dizzy this morning.  Denies any chest pain or shortness of breath.   Abscess   Past Medical History:  Diagnosis Date   Anemia    in younger years   Arthritis    "hands; legs" (08/17/2013)   Coronary artery disease    6 stents in 2015   GERD (gastroesophageal reflux disease)    H/O hiatal hernia    High cholesterol    Hx of adenomatous colonic polyps 08/15/2018   Hypertension    Radiation 11/28/14-01/02/15   Right breast 50 Gray   S/P TAVR (transcatheter aortic valve replacement)    Severe aortic stenosis    Vertigo     Patient Active Problem List   Diagnosis Date Noted   CHB (complete heart block) (HCC) 10/12/2023   S/P TAVR (transcatheter aortic valve replacement) 10/11/2023   Hypertension    High cholesterol    Severe aortic stenosis 09/29/2023   Lumbar herniated disc 12/16/2020   Foot drop, right 12/16/2020   H/O total hip arthroplasty 12/27/2019   H/O total hip arthroplasty, left 12/26/2019   Spinal stenosis of lumbosacral region 09/22/2017   Breast cancer of upper-outer quadrant of right female breast (HCC) 08/27/2014   Coronary atherosclerosis of native coronary artery 08/18/2013   S/P PTCA (percutaneous transluminal coronary angioplasty) 08/17/2013    Past Surgical History:  Procedure Laterality Date   ABDOMINAL AORTOGRAM N/A 08/30/2023   Procedure: ABDOMINAL AORTOGRAM;  Surgeon: Kyra Phy, MD;  Location: [facility] INVAS[facility] CV LAB;  Service: Cardiovascular;  Laterality: N/A;    ABDOMINAL HYSTERECTOMY  1985   APPENDECTOMY  1960   BREAST LUMPECTOMY WITH NEEDLE LOCALIZATION AND AXILLARY SENTINEL LYMPH NODE BX Right 10/17/2014   Procedure: BREAST LUMPECTOMY WITH NEEDLE LOCALIZATION AND AXILLARY SENTINEL LYMPH NODE BX;  Surgeon: Adalberto Hollow, MD;  Location: [facility] OR;  Service: General;  Laterality: Right;   CARDIAC CATHETERIZATION  08/07/2013   COLONOSCOPY     CORONARY ANGIOPLASTY WITH STENT PLACEMENT  08/17/2013   "6 stents"(08/17/2013)   INTRAOPERATIVE TRANSTHORACIC ECHOCARDIOGRAM N/A 10/11/2023   Procedure: ECHOCARDIOGRAM, TRANSTHORACIC;  Surgeon: Arnoldo Lapping, MD;  Location: [facility] INVASIVE CV LAB;  Service: Cardiovascular;  Laterality: N/A;   LEFT AND RIGHT HEART CATHETERIZATION WITH CORONARY ANGIOGRAM N/A 08/07/2013   Procedure: LEFT AND RIGHT HEART CATHETERIZATION WITH CORONARY ANGIOGRAM;  Surgeon: Jessica Morn, MD;  Location: [facility] CATH LAB;  Service: Cardiovascular;  Laterality: N/A;   PACEMAKER IMPLANT N/A 10/12/2023   Procedure: PACEMAKER IMPLANT;  Surgeon: Ardeen Kohler, MD;  Location: [facility] INVASIVE CV LAB;  Service: Cardiovascular;  Laterality: N/A;   PERCUTANEOUS CORONARY STENT INTERVENTION (PCI-S) N/A 08/17/2013   Procedure: PERCUTANEOUS CORONARY STENT INTERVENTION (PCI-S);  Surgeon: Jessica Morn, MD;  Location: [facility] CATH LAB;  Service: Cardiovascular;  Laterality: N/A;   RIGHT HEART CATH AND CORONARY ANGIOGRAPHY N/A 08/30/2023   Procedure: RIGHT HEART CATH AND CORONARY ANGIOGRAPHY;  Surgeon: Kyra Phy, MD;  Location: [facility] INVASIVE CV  LAB;  Service: Cardiovascular;  Laterality: N/A;   TOTAL HIP ARTHROPLASTY Right 12/19/2017   Procedure: RIGHT TOTAL HIP ARTHROPLASTY ANTERIOR APPROACH;  Surgeon: Adah Acron, MD;  Location: [facility] OR;  Service: Orthopedics;  Laterality: Right;   TOTAL HIP ARTHROPLASTY Left 12/26/2019   Procedure: TOTAL HIP ARTHROPLASTY-DIRECT ANTERIOR;  Surgeon: Adah Acron, MD;  Location: [facility] OR;  Service: Orthopedics;  Laterality: Left;   TUBAL  LIGATION  1980    OB History   No obstetric history on file.      Home Medications    Prior to Admission medications   Medication Sig Start Date End Date Taking? Authorizing Provider  amLODipine  (NORVASC ) 2.5 MG tablet Take 2.5 mg by mouth daily.   Yes [provider]  aspirin  81 MG EC tablet Take 81 mg by mouth daily.    Yes [provider]  cholecalciferol  (VITAMIN D3) 25 MCG (1000 UNIT) tablet Take 1,000 Units by mouth daily.   Yes [provider]  isosorbide  mononitrate (IMDUR ) 60 MG 24 hr tablet Take 1 tablet (60 mg total) by mouth daily. 08/07/13  Yes Knox Perl, MD  LINZESS 72 MCG capsule Take 72 mcg by mouth daily as needed (constipation). 07/29/23  Yes [provider]  metoprolol  succinate (TOPROL -XL) 50 MG 24 hr tablet Take 50 mg by mouth daily. Take with or immediately following a meal.   Yes [provider]  Multiple Minerals-Vitamins (CALCIUM  & VIT D3 BONE HEALTH PO) Take 1 tablet by mouth daily.   Yes [provider]  nitroGLYCERIN  (NITROSTAT ) 0.4 MG SL tablet Place 1 tablet (0.4 mg total) under the tongue every 5 (five) minutes as needed for chest pain. 08/07/13  Yes Knox Perl, MD  omeprazole (PRILOSEC) 40 MG capsule Take 40 mg by mouth daily as needed (acid reflux).  11/14/17  Yes [provider]  rosuvastatin  (CRESTOR ) 20 MG tablet Take 20 mg by mouth daily. 12/30/14  Yes [provider]  spironolactone  (ALDACTONE ) 25 MG tablet Take 12.5 mg by mouth daily.   Yes [provider]  sulfamethoxazole -trimethoprim  (BACTRIM  DS) 800-160 MG tablet Take 1 tablet by mouth 2 (two) times daily for 7 days. 10/26/23 11/02/23 Yes Clea Dubach, Verdel Gitelman, FNP    Family History Family History  Problem Relation Age of Onset   Cancer Brother    Colon cancer Brother    Cancer Brother    Heart attack Brother    Esophageal cancer Neg Hx    Stomach cancer Neg Hx    Rectal cancer Neg Hx     Social History Social History    Tobacco Use   Smoking status: Former    Current packs/day: 0.00    Average packs/day: 1 pack/day for 52.0 years (52.0 ttl pk-yrs)    Types: Cigarettes    Start date: 04/04/1962    Quit date: 04/04/2014    Years since quitting: 9.5   Smokeless tobacco: Never  Vaping Use   Vaping status: Never Used  Substance Use Topics   Alcohol  use: No    Comment: 08/17/2013 "quit drinking at age 34; never did drink much"   Drug use: No     Allergies   Penicillins   Review of Systems Review of Systems  See HPI Physical Exam Triage Vital Signs ED Triage Vitals [10/26/23 1004]  Encounter Vitals Group     BP (!) 143/64     Systolic BP Percentile      Diastolic BP Percentile      Pulse  Rate 78     Resp 16     Temp 97.7 F (36.5 C)     Temp Source Oral     SpO2 98 %     Weight      Height      Head Circumference      Peak Flow      Pain Score      Pain Loc      Pain Education      Exclude from Growth Chart    No data found.  Updated Vital Signs BP (!) 143/64 (BP Location: Left Arm)   Pulse 78   Temp 97.7 F (36.5 C) (Oral)   Resp 16   SpO2 98%   Visual Acuity Right Eye Distance:   Left Eye Distance:   Bilateral Distance:    Right Eye Near:   Left Eye Near:    Bilateral Near:     Physical Exam Vitals and nursing note reviewed.  Constitutional:      General: She is not in acute distress.    Appearance: Normal appearance. She is not ill-appearing or toxic-appearing.  Pulmonary:     Effort: Pulmonary effort is normal.  Skin:    General: Skin is warm and dry.     Comments: Large abscess to right labia extending.  Multiple fluctuant areas with discoloration Very tender to touch  Neurological:     Mental Status: She is alert.  Psychiatric:        Mood and Affect: Mood normal.      UC Treatments / Results  Labs (all labs ordered are listed, but only abnormal results are displayed) Labs Reviewed - No data to display  EKG   Radiology No results  found.  Procedures Incision and Drainage  Date/Time: 10/26/2023 10:58 AM  Performed by: Landa Pine, FNP Authorized by: Landa Pine, FNP   Consent:    Consent obtained:  Verbal   Consent given by:  Patient   Risks discussed:  Bleeding, incomplete drainage and pain   Alternatives discussed:  No treatment Universal protocol:    Patient identity confirmed:  Verbally with patient Location:    Type:  Abscess   Location:  Anogenital   Anogenital location:  Vulva Sedation:    Sedation type:  None Anesthesia:    Anesthesia method:  Local infiltration   Local anesthetic:  Lidocaine  1% w/o epi Procedure type:    Complexity:  Simple Procedure details:    Ultrasound guidance: no     Needle aspiration: no     Incision types:  Stab incision   Incision depth:  Subcutaneous   Drainage:  Bloody and purulent   Drainage amount:  Copious   Wound treatment:  Wound left open   Packing materials:  None Post-procedure details:    Procedure completion:  Tolerated  (including critical care time)  Medications Ordered in UC Medications - No data to display  Initial Impression / Assessment and Plan / UC Course  I have reviewed the triage vital signs and the nursing notes.  Pertinent labs & imaging results that were available during my care of the patient were reviewed by me and considered in my medical decision making (see chart for details).     Abscess-drained here in clinic.  Moderate amount of foul-smelling purulent discharge. Patient tolerated well.  Will place her on Bactrim  for antibiotic coverage. Recommended Tylenol  for pain as needed Continue warm soaks and keep the area clean. Follow-up as needed Final Clinical Impressions(s) /  UC Diagnoses   Final diagnoses:  Abscess     Discharge Instructions      We have drained the abscess here today. Recommend continue warm compresses to the area and warm soaks to promote more drainage. We are put in the also antibiotics for  the infection. Take the antibiotics as prescribed. Follow-up for any continued issues     ED Prescriptions     Medication Sig Dispense Auth. Provider   sulfamethoxazole -trimethoprim  (BACTRIM  DS) 800-160 MG tablet Take 1 tablet by mouth 2 (two) times daily for 7 days. 14 tablet Landa Pine, FNP      PDMP not reviewed this encounter.   Landa Pine, FNP 10/26/23 1059

## 2023-10-26 NOTE — Telephone Encounter (Signed)
 Pt called stating she has had severe trembling/shaking since being seen this AM. Reports she has taken the medication prescribed.   Per T. Horald Lyme, FNP, "Lets make sure she is drinking fluids. Did she eat with the medication?? If she feels like she is going to pass out then she needs to go to the ER."  Pt states she has not eaten. Encouraged to eat and hydrate. ER precautions given. Pt denies feeling like she may pass out, but verbalized understanding of recs and precautions.

## 2023-10-28 LAB — CBC
Hematocrit: 38.6 % (ref 34.0–46.6)
Hemoglobin: 12.6 g/dL (ref 11.1–15.9)
MCH: 27.4 pg (ref 26.6–33.0)
MCHC: 32.6 g/dL (ref 31.5–35.7)
MCV: 84 fL (ref 79–97)
Platelets: 169 10*3/uL (ref 150–450)
RBC: 4.6 x10E6/uL (ref 3.77–5.28)
RDW: 15.3 % (ref 11.7–15.4)
WBC: 8.7 10*3/uL (ref 3.4–10.8)

## 2023-11-09 ENCOUNTER — Telehealth: Payer: Self-pay

## 2023-11-09 NOTE — Telephone Encounter (Signed)
 The pt called into the office because her BP is 98/69 and pulse 71.  The pt went to the store this morning and noticed that she was lightheaded and this prompted her to check BP.  The pt has already taken all meds this morning and I advised her to increase her water intake today. The pt checks her BP and states that last week it was good and SBP was 120-130.  I advised the pt to monitor her BP, keep a log and bring this to her follow up appointment on 5/19.  We can review BP trend and make medication adjustments as needed.  Pt agreed with plan.

## 2023-11-11 ENCOUNTER — Other Ambulatory Visit: Payer: Self-pay | Admitting: Adult Health

## 2023-11-11 ENCOUNTER — Encounter: Payer: Self-pay | Admitting: Adult Health

## 2023-11-11 DIAGNOSIS — N63 Unspecified lump in unspecified breast: Secondary | ICD-10-CM

## 2023-11-11 DIAGNOSIS — Z170 Estrogen receptor positive status [ER+]: Secondary | ICD-10-CM

## 2023-11-15 ENCOUNTER — Telehealth: Payer: Self-pay

## 2023-11-15 NOTE — Telephone Encounter (Signed)
 The pt called into the office this morning complaining of dizziness and feeling like she was going to pass out.  The spells are similar to what she experienced prior to TAVR.  The pt did take her BP and states that the top number was in the 90s at time of spell.  Discussed with Jeronimo Moors PA-C and the pt should hold her Amlodipine  at this time and keep scheduled follow up on 5/19.  The pt has a wrist BP cuff and states that it is old and keep giving an error message.  I discussed that she may need to get a new BP monitor and we can discuss this further at her upcoming visit.

## 2023-11-17 NOTE — Progress Notes (Signed)
 [facility]   MULTIDISCIPLINARY HEART VALVE CLINIC                                     Cardiology Office Note:    Date:  11/21/2023   ID:  Carolyn Rowland, DOB Dec 22, 1940, MRN [id number]  PCP:  Carolyn Cisco, NP  [facility] Cardiologist:  Chapman Commodore, MD  [facility] Structural heart: Arnoldo Lapping, MD [facility] Electrophysiologist:  None   Referring MD: Carolyn Cisco, NP   1 month s/p TAVR  History of Present Illness:    Carolyn Rowland is a 83 y.o. female with a hx of right breast cancer (s/p lumpectomy, radiation, and antiestrogen therapy), CAD s/p previous PCI to RCA and LCx (2015), HTN, HLD, CKD stage IIIa, morbid obesity (BMI 38), RBBB, and severe AS s/p TAVR (10/11/23) who presents to clinic for follow up.   She developed progressive dyspnea and dizziness. Echo 08/12/23 showed EF 60%, severe LVH, mild TR, and severe AS with mean grad 27.5 mmHg, AVA 0.70 cm2. [facility] 08/30/23 showed 10% prox Cx to Mid Cx stenosis, 80% 2nd Mrg stenosis, 10% ost RCA to mid RCA stenosis, 10% dist Cx stenosis and patent proximal to mid right coronary artery stents with minimal in-stent restenosis and widely patent proximal and distal left circumflex stents. There was a high-grade lesion of the ostium of the first obtuse marginal that should be treated medically and mild diffuse nonobstructive disease of the LAD. TAVR CTs showed an aortic valve calcium  score of 1766 c/w severe AS. S/p TAVR with a 29 mm Medtronic Evolut FX THV via the TF approach on 10/11/23. 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 Daneil Dunker on 10/12/23. She had symptomatic atrial tachycardia and resumed on home Torpol XL.   Had some dizziness and Norvasc  discontinued. Pt felt palpitations and restarted it on her  own.  Today the patient presents to clinic for follow up. Here alone. No CP or SOB. No LE edema, orthopnea or PND. Has recurrent dizziness but no syncope. No blood in stool or urine. Has some indigestion after certain foods. Continues to have ongoing palpitations and dizziness which predated TAVR. No real pattern.    Past Medical History:  Diagnosis Date   Anemia    in younger years   Arthritis    "hands; legs" (08/17/2013)   Coronary artery disease    6 stents in 2015   GERD (gastroesophageal reflux disease)    H/O hiatal hernia    High cholesterol    Hx of adenomatous colonic polyps 08/15/2018   Hypertension    Radiation 11/28/14-01/02/15   Right breast 50 Gray   S/P TAVR (transcatheter aortic valve replacement)    Severe aortic stenosis    Vertigo      Current Medications: Current Meds  Medication Sig   amLODipine  (NORVASC ) 2.5 MG tablet Take 2.5 mg by mouth daily.   aspirin  81 MG EC tablet Take 81 mg by mouth daily.    azithromycin  (ZITHROMAX ) 500 MG tablet Take 1 tablet (500 mg total) by mouth as directed. Take one tablet 1 hour before any dental work including cleanings.   cholecalciferol  (VITAMIN D3) 25 MCG (1000 UNIT) tablet Take  1,000 Units by mouth daily.   isosorbide  mononitrate (IMDUR ) 60 MG 24 hr tablet Take 1 tablet (60 mg total) by mouth daily.   LINZESS 72 MCG capsule Take 72 mcg by mouth daily as needed (constipation).   metoprolol  succinate (TOPROL -XL) 50 MG 24 hr tablet Take 50 mg by mouth daily. Take with or immediately following a meal.   Multiple Minerals-Vitamins (CALCIUM  & VIT D3 BONE HEALTH PO) Take 1 tablet by mouth daily.   nitroGLYCERIN  (NITROSTAT ) 0.4 MG SL tablet Place 1 tablet (0.4 mg total) under the tongue every 5 (five) minutes as needed for chest pain.   omeprazole (PRILOSEC) 40 MG capsule Take 40 mg by mouth daily as needed (acid reflux).    rosuvastatin  (CRESTOR ) 20 MG tablet Take 20 mg by mouth daily.   spironolactone  (ALDACTONE ) 25 MG tablet  Take 12.5 mg by mouth daily.      ROS:   Please see the history of present illness.    All other systems reviewed and are negative.  EKGs       Risk Assessment/Calculations:           Physical Exam:    VS:  BP 122/78   Pulse 70   Ht 5\' 5"  (1.651 m)   Wt 202 lb (91.6 kg)   SpO2 97%   BMI 33.61 kg/m     Wt Readings from Last 3 Encounters:  11/21/23 202 lb (91.6 kg)  10/26/23 202 lb 9.6 oz (91.9 kg)  10/13/23 208 lb 12.4 oz (94.7 kg)     GEN: Well nourished, well developed in no acute distress NECK: No JVD CARDIAC: RRR, soft flow murmur. No rubs, gallops RESPIRATORY:  Clear to auscultation without rales, wheezing or rhonchi  ABDOMEN: Soft, non-tender, non-distended EXTREMITIES:  No edema; No deformity.   ASSESSMENT:    1. S/P TAVR (transcatheter aortic valve replacement)   2. Pacemaker   3. Primary hypertension   4. Coronary artery disease due to lipid rich plaque   5. Pulmonary nodule   6. History of breast cancer   7. Palpitations      PLAN:    In order of problems listed above:  Severe AS s/p TAVR:  -- Echo today shows EF >75%, mild SAM with mild MR, normally functioning TAVR with a mean gradient of 7 mm hg and trivial PVL.  -- NYHA class I symptoms.  -- SBE prophylaxis discussed; she has azithromycin  due to a PCN allergy.   -- Continue Aspirin  81mg  daily. -- I will see back for 1 year echo and OV.  CHB s/p PPM: -- Followed by Dr. Daneil Dunker.  HTN:  -- Bp well controlled today.  -- Continue Toprol  XL 50mg  daily, Imdur  60mg  daily, spironolactone  12.5mg  daily and Norvasc  2.5 mg daily.   CAD: -- No chest pain. -- Continue medical management.  -- Continue Asprin 81mg  daily.  -- Continue Crestor  20mg  daily.    Pulmonary nodule: -- 5 mm pleural based right lower lobe pulmonary nodule. No follow-up needed if patient is low-risk (high risk given breast cancer history).  -- Will get repeat chest CT in 1 year given breast cancer history.    History  of breast cancer: -- Pre TAVR CTs showed "asymmetric right breast tissue with stated late appearance of some of the breast tissues with associated biopsy marker adjacent to this. Finding could be due to prior treatment given MRI breast 08/20/2014 report. Recommend correlation with more recent diagnostic mammography." -- Discussed with Davied Estelle NP and  she has gotten her set up for a mammogram.   Palpitations/dizziness: -- This has been ongoing.  -- Place a 7 day Zio to rule out arrhythmia.     Cardiac Rehabilitation Eligibility Assessment  The patient is ready to start cardiac rehabilitation from a cardiac standpoint.    Medication Adjustments/Labs and Tests Ordered: Current medicines are reviewed at length with the patient today.  Concerns regarding medicines are outlined above.  Orders Placed This Encounter  Procedures   LONG TERM MONITOR (3-14 DAYS)   ECHOCARDIOGRAM COMPLETE   No orders of the defined types were placed in this encounter.   Patient Instructions  Medication Instructions:  Your physician recommends that you continue on your current medications as directed. Please refer to the Current Medication list given to you today.  *If you need a refill on your cardiac medications before your next appointment, please call your pharmacy*  Lab Work: none If you have labs (blood work) drawn today and your tests are completely normal, you will receive your results only by: MyChart Message (if you have MyChart) OR A paper copy in the mail If you have any lab test that is abnormal or we need to change your treatment, we will call you to review the results.  Testing/Procedures:  Delane Fear- Long Term Monitor Instructions  Your physician has requested you wear a ZIO patch monitor for 7 days.  This is a single patch monitor. [employer] supplies one patch monitor per enrollment. Additional stickers are not available. Please do not apply patch if you will be having a Nuclear Stress  Test,  Echocardiogram, Cardiac CT, MRI, or Chest Xray during the period you would be wearing the  monitor. The patch cannot be worn during these tests. You cannot remove and re-apply the  ZIO XT patch monitor.  Your ZIO patch monitor will be mailed 3 day USPS to your address on file. It may take 3-5 days  to receive your monitor after you have been enrolled.  Once you have received your monitor, please review the enclosed instructions. Your monitor  has already been registered assigning a specific monitor serial # to you.  Billing and Patient Assistance Program Information  We have supplied [employer] with any of your insurance information on file for billing purposes. [employer] offers a sliding scale Patient Assistance Program for patients that do not have  insurance, or whose insurance does not completely cover the cost of the ZIO monitor.  You must apply for the Patient Assistance Program to qualify for this discounted rate.  To apply, please call [employer] at [phone], select option 4, select option 2, ask to apply for  Patient Assistance Program. [name] will ask your household income, and how many people  are in your household. They will quote your out-of-pocket cost based on that information.  [employer] will also be able to set up a 73-month, interest-free payment plan if needed.  Applying the monitor   Shave hair from upper left chest.  Hold abrader disc by orange tab. Rub abrader in 40 strokes over the upper left chest as  indicated in your monitor instructions.  Clean area with 4 enclosed alcohol  pads. Let dry.  Apply patch as indicated in monitor instructions. Patch will be placed under collarbone on left  side of chest with arrow pointing upward.  Rub patch adhesive wings for 2 minutes. Remove white label marked "1". Remove the white  label marked "2". Rub patch adhesive wings for 2 additional minutes.  While looking in  a mirror, press and release button in center of patch. A  small green light will  flash 3-4 times. This will be your only indicator that the monitor has been turned on.  Do not shower for the first 24 hours. You may shower after the first 24 hours.  Press the button if you feel a symptom. You will hear a small click. Record Date, Time and  Symptom in the Patient Logbook.  When you are ready to remove the patch, follow instructions on the last 2 pages of Patient  Logbook. Stick patch monitor onto the last page of Patient Logbook.  Place Patient Logbook in the blue and white box. Use locking tab on box and tape box closed  securely. The blue and white box has prepaid postage on it. Please place it in the mailbox as  soon as possible. Your physician should have your test results approximately 7 days after the  monitor has been mailed back to [facility].  Call [facility] Customer Care at [phone] if you have questions regarding  your ZIO XT patch monitor. Call them immediately if you see an orange light blinking on your  monitor.  If your monitor falls off in less than 4 days, contact our Monitor department at [phone].  If your monitor becomes loose or falls off after 4 days call [employer] at [phone] for  suggestions on securing your monitor   Follow-Up: At [facility], you and your health needs are our priority.  As part of our continuing mission to provide you with exceptional heart care, our providers are all part of one team.  This team includes your primary Cardiologist (physician) and Advanced Practice Providers or APPs (Physician Assistants and Nurse Practitioners) who all work together to provide you with the care you need, when you need it.  Your next appointment:   Please keep you appt with Dr. Glena Landau for 6 month follow up. Please keep your follow up with Dr. Daneil Dunker in July. We will see you back in April of 2026 for echo and follow up.    Signed, Abagail Hoar, PA-C  11/21/2023 5:50 PM    [facility]

## 2023-11-21 ENCOUNTER — Ambulatory Visit: Attending: Physician Assistant | Admitting: Physician Assistant

## 2023-11-21 ENCOUNTER — Other Ambulatory Visit (HOSPITAL_COMMUNITY): Payer: Self-pay

## 2023-11-21 ENCOUNTER — Ambulatory Visit

## 2023-11-21 ENCOUNTER — Ambulatory Visit: Payer: Self-pay | Admitting: Physician Assistant

## 2023-11-21 ENCOUNTER — Encounter: Payer: Self-pay | Admitting: Physician Assistant

## 2023-11-21 ENCOUNTER — Ambulatory Visit (HOSPITAL_COMMUNITY)
Admission: RE | Admit: 2023-11-21 | Discharge: 2023-11-21 | Disposition: A | Discharge status: Home / Self Care | Source: Ambulatory Visit | Attending: Internal Medicine | Admitting: Internal Medicine

## 2023-11-21 VITALS — BP 122/78 | HR 70 | Ht 65.0 in | Wt 202.0 lb

## 2023-11-21 DIAGNOSIS — Z853 Personal history of malignant neoplasm of breast: Secondary | ICD-10-CM

## 2023-11-21 DIAGNOSIS — I10 Essential (primary) hypertension: Secondary | ICD-10-CM

## 2023-11-21 DIAGNOSIS — R002 Palpitations: Secondary | ICD-10-CM

## 2023-11-21 DIAGNOSIS — Z952 Presence of prosthetic heart valve: Secondary | ICD-10-CM | POA: Insufficient documentation

## 2023-11-21 DIAGNOSIS — I2510 Atherosclerotic heart disease of native coronary artery without angina pectoris: Secondary | ICD-10-CM | POA: Diagnosis not present

## 2023-11-21 DIAGNOSIS — R911 Solitary pulmonary nodule: Secondary | ICD-10-CM

## 2023-11-21 DIAGNOSIS — I2583 Coronary atherosclerosis due to lipid rich plaque: Secondary | ICD-10-CM

## 2023-11-21 DIAGNOSIS — Z950 Presence of cardiac pacemaker: Secondary | ICD-10-CM

## 2023-11-21 LAB — ECHOCARDIOGRAM COMPLETE
AR max vel: 3.61 cm2
AV Area VTI: 3.66 cm2
AV Area mean vel: 3.84 cm2
AV Mean grad: 7 mmHg
AV Peak grad: 13.7 mmHg
Ao pk vel: 1.85 m/s
Area-P 1/2: 2.93 cm2
Est EF: >75
S' Lateral: 1.81 cm

## 2023-11-21 NOTE — Progress Notes (Unsigned)
 Enrolled for [employer] to mail a ZIO XT long term holter monitor to the patients address on file.   Dr. Excell Seltzer to read.

## 2023-11-21 NOTE — Patient Instructions (Addendum)
 Medication Instructions:  Your physician recommends that you continue on your current medications as directed. Please refer to the Current Medication list given to you today.  *If you need a refill on your cardiac medications before your next appointment, please call your pharmacy*  Lab Work: none If you have labs (blood work) drawn today and your tests are completely normal, you will receive your results only by: MyChart Message (if you have MyChart) OR A paper copy in the mail If you have any lab test that is abnormal or we need to change your treatment, we will call you to review the results.  Testing/Procedures:  Carolyn Rowland- Long Term Monitor Instructions  Your physician has requested you wear a ZIO patch monitor for 7 days.  This is a single patch monitor. [employer] supplies one patch monitor per enrollment. Additional stickers are not available. Please do not apply patch if you will be having a Nuclear Stress Test,  Echocardiogram, Cardiac CT, MRI, or Chest Xray during the period you would be wearing the  monitor. The patch cannot be worn during these tests. You cannot remove and re-apply the  ZIO XT patch monitor.  Your ZIO patch monitor will be mailed 3 day USPS to your address on file. It may take 3-5 days  to receive your monitor after you have been enrolled.  Once you have received your monitor, please review the enclosed instructions. Your monitor  has already been registered assigning a specific monitor serial # to you.  Billing and Patient Assistance Program Information  We have supplied [employer] with any of your insurance information on file for billing purposes. [employer] offers a sliding scale Patient Assistance Program for patients that do not have  insurance, or whose insurance does not completely cover the cost of the ZIO monitor.  You must apply for the Patient Assistance Program to qualify for this discounted rate.  To apply, please call [employer] at [phone], select  option 4, select option 2, ask to apply for  Patient Assistance Program. Carolyn Rowland will ask your household income, and how many people  are in your household. They will quote your out-of-pocket cost based on that information.  [employer] will also be able to set up a 42-month, interest-free payment plan if needed.  Applying the monitor   Shave hair from upper left chest.  Hold abrader disc by orange tab. Rub abrader in 40 strokes over the upper left chest as  indicated in your monitor instructions.  Clean area with 4 enclosed alcohol  pads. Let dry.  Apply patch as indicated in monitor instructions. Patch will be placed under collarbone on left  side of chest with arrow pointing upward.  Rub patch adhesive wings for 2 minutes. Remove white label marked "1". Remove the white  label marked "2". Rub patch adhesive wings for 2 additional minutes.  While looking in a mirror, press and release button in center of patch. A small green light will  flash 3-4 times. This will be your only indicator that the monitor has been turned on.  Do not shower for the first 24 hours. You may shower after the first 24 hours.  Press the button if you feel a symptom. You will hear a small click. Record Date, Time and  Symptom in the Patient Logbook.  When you are ready to remove the patch, follow instructions on the last 2 pages of Patient  Logbook. Stick patch monitor onto the last page of Patient Logbook.  Place Patient Logbook in the  blue and white box. Use locking tab on box and tape box closed  securely. The blue and white box has prepaid postage on it. Please place it in the mailbox as  soon as possible. Your physician should have your test results approximately 7 days after the  monitor has been mailed back to [facility].  Call [name] Customer Care at [phone] if you have questions regarding  your ZIO XT patch monitor. Call them immediately if you see an orange light blinking on your  monitor.   If your monitor falls off in less than 4 days, contact our Monitor department at [phone].  If your monitor becomes loose or falls off after 4 days call [employer] at [phone] for  suggestions on securing your monitor   Follow-Up: At [name], you and your health needs are our priority.  As part of our continuing mission to provide you with exceptional heart care, our providers are all part of one team.  This team includes your primary Cardiologist (physician) and Advanced Practice Providers or APPs (Physician Assistants and Nurse Practitioners) who all work together to provide you with the care you need, when you need it.  Your next appointment:   Please keep you appt with Dr. Glena Rowland for 6 month follow up. Please keep your follow up with Dr. Daneil Rowland in July. We will see you back in April of 2026 for echo and follow up.

## 2023-11-28 ENCOUNTER — Ambulatory Visit (INDEPENDENT_AMBULATORY_CARE_PROVIDER_SITE_OTHER)

## 2023-11-28 DIAGNOSIS — I442 Atrioventricular block, complete: Secondary | ICD-10-CM | POA: Diagnosis not present

## 2023-11-29 LAB — CUP PACEART REMOTE DEVICE CHECK
Battery Remaining Longevity: 146 mo
Battery Voltage: 3.21 V
Brady Statistic AP VP Percent: 2.33 %
Brady Statistic AP VS Percent: 0 %
Brady Statistic AS VP Percent: 95.1 %
Brady Statistic AS VS Percent: 2.57 %
Brady Statistic RA Percent Paced: 3 %
Brady Statistic RV Percent Paced: 97.43 %
Date Time Interrogation Session: 20250526000124
Implantable Lead Connection Status: 753985
Implantable Lead Connection Status: 753985
Implantable Lead Implant Date: 20250409
Implantable Lead Implant Date: 20250409
Implantable Lead Location: 753858
Implantable Lead Location: 753859
Implantable Lead Model: 3830
Implantable Lead Model: 5076
Implantable Pulse Generator Implant Date: 20250409
Lead Channel Impedance Value: 304 Ohm
Lead Channel Impedance Value: 361 Ohm
Lead Channel Impedance Value: 418 Ohm
Lead Channel Impedance Value: 665 Ohm
Lead Channel Pacing Threshold Amplitude: 0.625 V
Lead Channel Pacing Threshold Amplitude: 0.875 V
Lead Channel Pacing Threshold Pulse Width: 0.4 ms
Lead Channel Pacing Threshold Pulse Width: 0.4 ms
Lead Channel Sensing Intrinsic Amplitude: 2.875 mV
Lead Channel Sensing Intrinsic Amplitude: 2.875 mV
Lead Channel Sensing Intrinsic Amplitude: 29.5 mV
Lead Channel Sensing Intrinsic Amplitude: 30.75 mV
Lead Channel Setting Pacing Amplitude: 3.5 V
Lead Channel Setting Pacing Amplitude: 3.5 V
Lead Channel Setting Pacing Pulse Width: 0.4 ms
Lead Channel Setting Sensing Sensitivity: 1.2 mV
Zone Setting Status: 755011

## 2023-11-30 ENCOUNTER — Telehealth (HOSPITAL_COMMUNITY): Payer: Self-pay | Admitting: *Deleted

## 2023-11-30 ENCOUNTER — Ambulatory Visit: Payer: Self-pay | Admitting: Cardiology

## 2023-11-30 NOTE — Telephone Encounter (Signed)
 Pt completed her follow up appt on 5/19 in the structural heart clinic.  Pt is now greater than 6 weeks post PPM.  Primary cardiologist - Dr. Glena Landau.  Called office and requested most recent office visit.  Will review and move forward to scheduling. Lettie Ray, BSN Cardiac and Emergency planning/management officer

## 2023-12-02 ENCOUNTER — Encounter (HOSPITAL_COMMUNITY): Payer: Self-pay

## 2023-12-02 ENCOUNTER — Telehealth (HOSPITAL_COMMUNITY): Payer: Self-pay

## 2023-12-02 NOTE — Telephone Encounter (Signed)
Attempted to call patient in regards to Cardiac Rehab - LM on VM   Sent letter 

## 2023-12-16 ENCOUNTER — Telehealth (HOSPITAL_COMMUNITY): Payer: Self-pay

## 2023-12-16 NOTE — Telephone Encounter (Signed)
 Patient called back stating she is interested in cardiac rehab program, would like to attend the 8:15 class. Our next opening for 8:15 would not be until July, and as of today the July schedule is not currently open. Informed patient we will call back to schedule her once our July schedule is open.Aaron Aas

## 2023-12-16 NOTE — Telephone Encounter (Signed)
 2 week f/u call for cardiac rehab referral, attempted to reach patient, no answer- left message stating we will be closing the referral and to call us  back if she has questions or would like us  to reopen it.  Closing referral.

## 2023-12-19 ENCOUNTER — Telehealth (HOSPITAL_COMMUNITY): Payer: Self-pay

## 2023-12-19 ENCOUNTER — Encounter (HOSPITAL_COMMUNITY): Payer: Self-pay

## 2023-12-19 NOTE — Telephone Encounter (Signed)
Cardiac Rehab

## 2023-12-19 NOTE — Telephone Encounter (Signed)
Attempted to call patient in regards to Cardiac Rehab - LM on VM   Sent letter 

## 2023-12-19 NOTE — Telephone Encounter (Signed)
 Pt returned CR phone call and stated she is interested in CR. Patient will come in for orientation on 01/10/24 @ 10:30AM and will attend the 10:15AM exercise class.   Pensions consultant.

## 2023-12-22 ENCOUNTER — Other Ambulatory Visit: Payer: Self-pay | Admitting: Nurse Practitioner

## 2023-12-22 DIAGNOSIS — K7689 Other specified diseases of liver: Secondary | ICD-10-CM

## 2023-12-22 DIAGNOSIS — R413 Other amnesia: Secondary | ICD-10-CM

## 2023-12-29 ENCOUNTER — Ambulatory Visit
Admission: RE | Admit: 2023-12-29 | Discharge: 2023-12-29 | Disposition: A | Discharge status: Home / Self Care | Source: Ambulatory Visit | Attending: Nurse Practitioner | Admitting: Nurse Practitioner

## 2023-12-29 DIAGNOSIS — K7689 Other specified diseases of liver: Secondary | ICD-10-CM

## 2024-01-03 ENCOUNTER — Encounter: Payer: Self-pay | Admitting: Adult Health

## 2024-01-10 ENCOUNTER — Inpatient Hospital Stay (HOSPITAL_COMMUNITY): Admission: RE | Admit: 2024-01-10 | Source: Ambulatory Visit

## 2024-01-10 ENCOUNTER — Telehealth (HOSPITAL_COMMUNITY): Payer: Self-pay | Admitting: *Deleted

## 2024-01-10 NOTE — Telephone Encounter (Signed)
 Patient was scheduled for cardiac rehab orientation today at 1030. After 1030, patient called to make sure she was planning to attend. Patient states she's having vertigo, which started yesterday. She's not sure her Meclizine  is working as it's expired. I recommended she contact her PCP, and she's agreeable to this. She would like to reschedule, and I let her know the scheduling team will give her a call back at a later date to reschedule. She verbalizes understanding.

## 2024-01-13 NOTE — Progress Notes (Signed)
 Remote pacemaker transmission.

## 2024-01-13 NOTE — Addendum Note (Signed)
 Addended by: TAWNI DRILLING D on: 01/13/2024 12:07 PM   Modules accepted: Orders

## 2024-01-16 ENCOUNTER — Ambulatory Visit (HOSPITAL_COMMUNITY)

## 2024-01-18 ENCOUNTER — Ambulatory Visit (HOSPITAL_COMMUNITY)

## 2024-01-19 ENCOUNTER — Telehealth (HOSPITAL_COMMUNITY): Payer: Self-pay

## 2024-01-19 ENCOUNTER — Encounter (HOSPITAL_COMMUNITY): Payer: Self-pay

## 2024-01-19 NOTE — Telephone Encounter (Signed)
 Called pt to see if she would like to reschedule for cardiac rehab, pt was interested, pt will come in for orientation on 7/29@8am  and will attend the 1:45 exercise class time.   Sent packet

## 2024-01-20 ENCOUNTER — Ambulatory Visit (HOSPITAL_COMMUNITY)

## 2024-01-23 ENCOUNTER — Ambulatory Visit (HOSPITAL_COMMUNITY)

## 2024-01-25 ENCOUNTER — Ambulatory Visit (HOSPITAL_COMMUNITY)

## 2024-01-27 ENCOUNTER — Ambulatory Visit (HOSPITAL_COMMUNITY)

## 2024-01-27 ENCOUNTER — Ambulatory Visit: Attending: Cardiology | Admitting: Cardiology

## 2024-01-27 ENCOUNTER — Encounter: Payer: Self-pay | Admitting: Cardiology

## 2024-01-27 VITALS — BP 114/68 | HR 75 | Ht 65.0 in | Wt 208.0 lb

## 2024-01-27 DIAGNOSIS — I10 Essential (primary) hypertension: Secondary | ICD-10-CM

## 2024-01-27 DIAGNOSIS — Z950 Presence of cardiac pacemaker: Secondary | ICD-10-CM | POA: Diagnosis not present

## 2024-01-27 DIAGNOSIS — I442 Atrioventricular block, complete: Secondary | ICD-10-CM

## 2024-01-27 DIAGNOSIS — Z952 Presence of prosthetic heart valve: Secondary | ICD-10-CM | POA: Diagnosis not present

## 2024-01-27 DIAGNOSIS — I2510 Atherosclerotic heart disease of native coronary artery without angina pectoris: Secondary | ICD-10-CM

## 2024-01-27 LAB — CUP PACEART INCLINIC DEVICE CHECK
Date Time Interrogation Session: 20250725154500
Implantable Lead Connection Status: 753985
Implantable Lead Connection Status: 753985
Implantable Lead Implant Date: 20250409
Implantable Lead Implant Date: 20250409
Implantable Lead Location: 753858
Implantable Lead Location: 753859
Implantable Lead Model: 3830
Implantable Lead Model: 5076
Implantable Pulse Generator Implant Date: 20250409

## 2024-01-27 NOTE — Progress Notes (Signed)
 Electrophysiology Office Note:   Date:  01/28/2024  ID:  [name], [name] Aug 19, 1940, MRN [id number]  Primary Cardiologist: Levern Hutching, MD Electrophysiologist: Fonda Kitty, MD      History of Present Illness:   Carolyn Rowland is a 83 y.o. female with h/o right breast cancer (s/p lumpectomy, radiation, and antiestrogen therapy), CAD s/p previous PCI to RCA and LCx (2015), HTN, HLD, CKD stage IIIa, morbid obesity (BMI 38), severe AS s/p TAVR complicated by complete heart block requiring dual-chamber pacemaker who is being seen today for 90-day follow-up pacemaker implant.  Discussed the use of AI scribe software for clinical note transcription with the patient, who gave verbal consent to proceed.  History of Present Illness Carolyn Rowland is an 83 year old female who presents for a three-month follow-up visit after pacemaker implantation.  She has been doing relatively well.  No interim hospitalizations.  No new or acute complaints today.  Review of systems complete and found to be negative unless listed in HPI.   EP Information / Studies Reviewed:    EKG is ordered today. Personal review as below.  EKG Interpretation Date/Time:  Friday January 27 2024 15:38:32 EDT Ventricular Rate:  75 PR Interval:  204 QRS Duration:  104 QT Interval:  418 QTC Calculation: 466 R Axis:   61  Text Interpretation: Atrial-sensed ventricular-paced rhythm When compared with ECG of 26-Oct-2023 15:38, Vent. rate has decreased BY  29 BPM Confirmed by Kitty Fonda [phone]) on 01/28/2024 10:21:29 AM   Echo 11/21/23:   1. Left ventricular ejection fraction, by estimation, is >75%. The left  ventricle has hyperdynamic function. The left ventricle has no regional  wall motion abnormalities. There is mild left ventricular hypertrophy.  Indeterminate diastolic filling due to   E-A fusion. The average left ventricular global longitudinal strain is  -22.1 %. The global longitudinal strain is normal.   2. Right  ventricular systolic function is normal. The right ventricular  size is normal.   3. Mild SAM of anterior mitral leaflet.. The mitral valve is normal in  structure. Mild mitral valve regurgitation.   4. S/p TAVR (29 mm Medtronic Evolut prosthesis; procedure date 10/11/23)  Peak and mean gradients through the valve are 14 and 7 mm Hg respectively.  Trivial perivalvular regurgitation.. The aortic valve has been  repaired/replaced.   5. The inferior vena cava is normal in size with greater than 50%  respiratory variability, suggesting right atrial pressure of 3 mmHg.   Physical Exam:   VS:  BP 114/68   Pulse 75   Ht 5' 5 (1.651 m)   Wt 208 lb (94.3 kg)   SpO2 94%   BMI 34.61 kg/m    Wt Readings from Last 3 Encounters:  01/27/24 208 lb (94.3 kg)  11/21/23 202 lb (91.6 kg)  10/26/23 202 lb 9.6 oz (91.9 kg)     GEN: Well nourished, well developed in no acute distress NECK: No JVD CARDIAC: Normal rate, regular rhythm.  Left chest pacer pocket well-healed. RESPIRATORY:  Clear to auscultation without rales, wheezing or rhonchi  ABDOMEN: Soft, non-distended EXTREMITIES:  No edema; No deformity   ASSESSMENT AND PLAN:   Carolyn Rowland is an 83 year old female with a pre-existing right bundle branch block who underwent TAVR implantation with a Medtronic Evolut valve which was complicated by complete heart block.  She underwent dual-chamber pacemaker implant on/9/25.  #. CHB s/p pacemaker:  - In-clinic device interrogation was performed today.  Appropriate device function and stable  lead parameters.  Presenting rhythm is AS/VP.  Estimated longevity 12.9 years.  No arrhythmias. -Continue remote monitoring.  #.  AS status post TAVR: Well compensated on exam. -Follow-up with general cardiology.  Continue aspirin  81 mg daily.  #.  CAD status post PCI: Denies chest pain. -Continue aspirin  81 mg daily.  Continue Crestor  20 mg daily.  #. Hypertension -At goal today.  Recommend checking blood  pressures 1-2 times per week at home and recording the values.  Recommend bringing these recordings to the primary care physician.  Follow up with Dr. Kennyth in 12 months  Signed, Fonda Kennyth, MD

## 2024-01-27 NOTE — Patient Instructions (Signed)
 Medication Instructions:  Your physician recommends that you continue on your current medications as directed. Please refer to the Current Medication list given to you today.  *If you need a refill on your cardiac medications before your next appointment, please call your pharmacy*  Follow-Up: At [facility], you and your health needs are our priority.  As part of our continuing mission to provide you with exceptional heart care, our providers are all part of one team.  This team includes your primary Cardiologist (physician) and Advanced Practice Providers or APPs (Physician Assistants and Nurse Practitioners) who all work together to provide you with the care you need, when you need it.  Your next appointment:   2 weeks  Provider:   You may see Fonda Kitty, MD or one of the following Advanced Practice Providers on your designated Care Team:   Charlies Arthur, PA-C Michael Andy Tillery, PA-C Suzann Riddle, NP Daphne Barrack, NP

## 2024-01-29 ENCOUNTER — Ambulatory Visit: Payer: Self-pay | Admitting: Cardiology

## 2024-01-30 ENCOUNTER — Ambulatory Visit (HOSPITAL_COMMUNITY)

## 2024-01-31 ENCOUNTER — Encounter (HOSPITAL_COMMUNITY)
Admission: RE | Admit: 2024-01-31 | Discharge: 2024-01-31 | Disposition: A | Discharge status: Home / Self Care | Source: Ambulatory Visit | Attending: Cardiovascular Disease | Admitting: Cardiovascular Disease

## 2024-01-31 VITALS — BP 126/86 | HR 67 | Ht 65.0 in | Wt 208.6 lb

## 2024-01-31 DIAGNOSIS — Z952 Presence of prosthetic heart valve: Secondary | ICD-10-CM | POA: Insufficient documentation

## 2024-01-31 NOTE — Progress Notes (Addendum)
 Cardiac Rehab Medication Review by a Nurse   Does the patient  feel that his/her medications are working for him/her?  yes   Has the patient been experiencing any side effects to the medications prescribed?  no   Does the patient measure his/her own blood pressure or blood glucose at home?  yes    Does the patient have any problems obtaining medications due to transportation or finances?   no   Understanding of regimen: excellent Understanding of indications: good Potential of compliance: good     Pt's asked some medication questions at this time and stated, I feel these are sufficiently answered.

## 2024-01-31 NOTE — Progress Notes (Signed)
 Cardiac Individual Treatment Plan  Patient Details  Name: Carolyn Rowland MRN: [id number] Date of Birth: 26-Sep-1940 Referring Provider:   Flowsheet Row INTENSIVE CARDIAC REHAB ORIENT from 01/31/2024 in [facility]  Referring Provider Ozell Fell, MD    Initial Encounter Date:  Flowsheet Row INTENSIVE CARDIAC REHAB ORIENT from 01/31/2024 in [facility]  Date 01/31/24    Visit Diagnosis: 10/11/23 S/P TAVR (transcatheter aortic valve replacement)  Patient's Home Medications on Admission:  Current Outpatient Medications:    amLODipine  (NORVASC ) 2.5 MG tablet, Take 2.5 mg by mouth daily., Disp: , Rfl:    aspirin  81 MG EC tablet, Take 81 mg by mouth daily. , Disp: , Rfl:    azithromycin  (ZITHROMAX ) 500 MG tablet, Take 1 tablet (500 mg total) by mouth as directed. Take one tablet 1 hour before any dental work including cleanings., Disp: 6 tablet, Rfl: 12   cholecalciferol  (VITAMIN D3) 25 MCG (1000 UNIT) tablet, Take 1,000 Units by mouth daily., Disp: , Rfl:    isosorbide  mononitrate (IMDUR ) 60 MG 24 hr tablet, Take 1 tablet (60 mg total) by mouth daily., Disp: 30 tablet, Rfl: 1   LINZESS 72 MCG capsule, Take 72 mcg by mouth daily as needed (constipation)., Disp: , Rfl:    metoprolol  succinate (TOPROL -XL) 50 MG 24 hr tablet, Take 50 mg by mouth daily. Take with or immediately following a meal., Disp: , Rfl:    Multiple Minerals-Vitamins (CALCIUM  & VIT D3 BONE HEALTH PO), Take 1 tablet by mouth daily., Disp: , Rfl:    nitroGLYCERIN  (NITROSTAT ) 0.4 MG SL tablet, Place 1 tablet (0.4 mg total) under the tongue every 5 (five) minutes as needed for chest pain., Disp: 30 tablet, Rfl: 1   omeprazole (PRILOSEC) 40 MG capsule, Take 40 mg by mouth daily as needed (acid reflux). , Disp: , Rfl: 1   rosuvastatin  (CRESTOR ) 20 MG tablet, Take 20 mg by mouth daily., Disp: , Rfl:    spironolactone   (ALDACTONE ) 25 MG tablet, Take 12.5 mg by mouth daily. (Patient taking differently: Take 12.5 mg by mouth daily.), Disp: , Rfl:   Past Medical History: Past Medical History:  Diagnosis Date   Anemia    in younger years   Arthritis    hands; legs (08/17/2013)   Coronary artery disease    6 stents in 2015   GERD (gastroesophageal reflux disease)    H/O hiatal hernia    High cholesterol    Hx of adenomatous colonic polyps 08/15/2018   Hypertension    Radiation 11/28/14-01/02/15   Right breast 50 Gray   S/P TAVR (transcatheter aortic valve replacement)    Severe aortic stenosis    Vertigo     Tobacco Use: Social History   Tobacco Use  Smoking Status Former   Current packs/day: 0.00   Average packs/day: 1 pack/day for 52.0 years (52.0 ttl pk-yrs)   Types: Cigarettes   Start date: 04/04/1962   Quit date: 04/04/2014   Years since quitting: 9.8  Smokeless Tobacco Never    Labs: Review Flowsheet       Latest Ref Rng & Units 08/07/2013 12/29/2015 08/30/2023 10/11/2023  Labs for ITP Cardiac and Pulmonary Rehab  Cholestrol 0 - 200 mg/dL - 832  - -  LDL (calc) 0 - 99 mg/dL - 898  - -  HDL-C >59 mg/dL - 50  - -  Trlycerides <150 mg/dL - 81  - -  PH, Arterial 7.35 - 7.45 7.364  - 7.366  -  PCO2 arterial 32 - 48 mmHg 46.5  - 43.1  -  Bicarbonate 20.0 - 28.0 mmol/L 26.5  27.6  - 19.7  26.6  24.7  -  TCO2 22 - 32 mmol/L 28  29  - 21  28  26  23    Acid-base deficit 0.0 - 2.0 mmol/L - - 8.0  1.0  -  O2 Saturation % 94.0  67.0  - 66  76  93  -    Details       Multiple values from one day are sorted in reverse-chronological order         Capillary Blood Glucose: No results found for: GLUCAP   Exercise Target Goals: Exercise Program Goal: Individual exercise prescription set using results from initial 6 min walk test and THRR while considering  patient's activity barriers and safety.   Exercise Prescription Goal: Initial exercise prescription builds to 30-45 minutes a day  of aerobic activity, 2-3 days per week.  Home exercise guidelines will be given to patient during program as part of exercise prescription that the participant will acknowledge.  Activity Barriers & Risk Stratification:  Activity Barriers & Cardiac Risk Stratification - 01/31/24 1000       Activity Barriers & Cardiac Risk Stratification   Activity Barriers Balance Concerns;Arthritis;Joint Problems;History of Falls;Deconditioning;Muscular Weakness;Shortness of Breath;Left Hip Replacement;Right Hip Replacement    Cardiac Risk Stratification High          6 Minute Walk:  6 Minute Walk     Row Name 01/31/24 0959         6 Minute Walk   Phase Initial     Distance 480 feet     Walk Time 6 minutes     # of Rest Breaks 1  1:54 to 3:56     MPH 1     METS 0.5     RPE 11     Perceived Dyspnea  1     VO2 Peak 1.5     Symptoms Yes (comment)     Comments SOB, RPD =1, Posterior thigh pain 4/10, both better with rest     Resting HR 67 bpm     Resting BP 126/86     Resting Oxygen  Saturation  98 %     Exercise Oxygen  Saturation  during 6 min walk 97 %     Max Ex. HR 89 bpm     Max Ex. BP 136/81     2 Minute Post BP 124/78        Oxygen  Initial Assessment:   Oxygen  Re-Evaluation:   Oxygen  Discharge (Final Oxygen  Re-Evaluation):   Initial Exercise Prescription:  Initial Exercise Prescription - 01/31/24 1000       Date of Initial Exercise RX and Referring Provider   Date 01/31/24    Referring Provider Ozell Fell, MD    Expected Discharge Date 04/18/24      NuStep   Level 3    SPM 70    Minutes 25    METs 1      Prescription Details   Frequency (times per week) 3    Duration Progress to 30 minutes of continuous aerobic without signs/symptoms of physical distress      Intensity   THRR 40-80% of Max Heartrate 56-112    Ratings of Perceived Exertion 11-13    Perceived Dyspnea 0-4      Progression   Progression Continue progressive overload  as per policy without  signs/symptoms or physical distress.      Resistance Training   Training Prescription Yes    Weight 2 lbs    Reps 10-15          Perform Capillary Blood Glucose checks as needed.  Exercise Prescription Changes:   Exercise Comments:   Exercise Goals and Review:   Exercise Goals     Row Name 01/31/24 0808             Exercise Goals   Increase Physical Activity Yes       Intervention Provide advice, education, support and counseling about physical activity/exercise needs.;Develop an individualized exercise prescription for aerobic and resistive training based on initial evaluation findings, risk stratification, comorbidities and participant's personal goals.       Expected Outcomes Long Term: Add in home exercise to make exercise part of routine and to increase amount of physical activity.;Long Term: Exercising regularly at least 3-5 days a week.;Short Term: Attend rehab on a regular basis to increase amount of physical activity.       Increase Strength and Stamina Yes       Intervention Provide advice, education, support and counseling about physical activity/exercise needs.;Develop an individualized exercise prescription for aerobic and resistive training based on initial evaluation findings, risk stratification, comorbidities and participant's personal goals.       Expected Outcomes Short Term: Increase workloads from initial exercise prescription for resistance, speed, and METs.;Short Term: Perform resistance training exercises routinely during rehab and add in resistance training at home;Long Term: Improve cardiorespiratory fitness, muscular endurance and strength as measured by increased METs and functional capacity ( )       Able to understand and use rate of perceived exertion (RPE) scale Yes       Intervention Provide education and explanation on how to use RPE scale       Expected Outcomes Short Term: Able to use RPE daily in rehab to express subjective intensity  level;Long Term:  Able to use RPE to guide intensity level when exercising independently       Knowledge and understanding of Target Heart Rate Range (THRR) Yes       Intervention Provide education and explanation of THRR including how the numbers were predicted and where they are located for reference       Expected Outcomes Short Term: Able to state/look up THRR;Long Term: Able to use THRR to govern intensity when exercising independently;Short Term: Able to use daily as guideline for intensity in rehab       Understanding of Exercise Prescription Yes       Intervention Provide education, explanation, and written materials on patient's individual exercise prescription       Expected Outcomes Short Term: Able to explain program exercise prescription;Long Term: Able to explain home exercise prescription to exercise independently          Exercise Goals Re-Evaluation :   Discharge Exercise Prescription (Final Exercise Prescription Changes):   Nutrition:  Target Goals: Understanding of nutrition guidelines, daily intake of sodium 1500mg , cholesterol 200mg , calories 30% from fat and 7% or less from saturated fats, daily to have 5 or more servings of fruits and vegetables.  Biometrics:  Pre Biometrics - 01/31/24 0900       Pre Biometrics   Waist Circumference 42 inches    Hip Circumference 49.5 inches    Waist to Hip Ratio 0.85 %    Triceps Skinfold 33 mm    % Body Fat  46.8 %    Grip Strength 14 kg    Flexibility --   Not performed   Single Leg Stand 2.12 seconds           Nutrition Therapy Plan and Nutrition Goals:   Nutrition Assessments:  MEDIFICTS Score Key: >=70 Need to make dietary changes  40-70 Heart Healthy Diet <= 40 Therapeutic Level Cholesterol Diet    Picture Your Plate Scores: <59 Unhealthy dietary pattern with much room for improvement. 41-50 Dietary pattern unlikely to meet recommendations for good health and room for improvement. 51-60 More healthful  dietary pattern, with some room for improvement.  >60 Healthy dietary pattern, although there may be some specific behaviors that could be improved.    Nutrition Goals Re-Evaluation:   Nutrition Goals Re-Evaluation:   Nutrition Goals Discharge (Final Nutrition Goals Re-Evaluation):   Psychosocial: Target Goals: Acknowledge presence or absence of significant depression and/or stress, maximize coping skills, provide positive support system. Participant is able to verbalize types and ability to use techniques and skills needed for reducing stress and depression.  Initial Review & Psychosocial Screening:  Initial Psych Review & Screening - 01/31/24 1251       Initial Review   Current issues with Current Anxiety/Panic;Current Depression      Family Dynamics   Good Support System? Yes   Pt has daughter for support system     Barriers   Psychosocial barriers to participate in program The patient should benefit from training in stress management and relaxation.      Screening Interventions   Interventions Encouraged to exercise;To provide support and resources with identified psychosocial needs;Provide feedback about the scores to participant    Expected Outcomes Long Term Goal: Stressors or current issues are controlled or eliminated.;Short Term goal: Identification and review with participant of any Quality of Life or Depression concerns found by scoring the questionnaire.;Long Term goal: The participant improves quality of Life and PHQ9 Scores as seen by post scores and/or verbalization of changes          Quality of Life Scores:  Quality of Life - 01/31/24 1104       Quality of Life   Select Quality of Life      Quality of Life Scores   Health/Function Pre 15.57 %    Socioeconomic Pre 21.17 %    Psych/Spiritual Pre 26 %    Family Pre 20 %    GLOBAL Pre 19.57 %         Scores of 19 and below usually indicate a poorer quality of life in these areas.  A difference of   2-3 points is a clinically meaningful difference.  A difference of 2-3 points in the total score of the Quality of Life Index has been associated with significant improvement in overall quality of life, self-image, physical symptoms, and general health in studies assessing change in quality of life.  PHQ-9: Review Flowsheet  More data may exist      01/31/2024 01/23/2020 12/10/2014 03/08/2014 11/05/2013  Depression screen PHQ 2/9  Decreased Interest 3 0 0 0 0  Down, Depressed, Hopeless 1 0 0 1 0  PHQ - 2 Score 4 0 0 1 0  Altered sleeping 1 - - - -  Tired, decreased energy 1 - - - -  Change in appetite 1 - - - -  Feeling bad or failure about yourself  0 - - - -  Trouble concentrating 0 - - - -  Moving slowly or fidgety/restless  0 - - - -  Suicidal thoughts 0 - - - -  PHQ-9 Score 7 - - - -  Difficult doing work/chores Not difficult at all - - - -   Interpretation of Total Score  Total Score Depression Severity:  1-4 = Minimal depression, 5-9 = Mild depression, 10-14 = Moderate depression, 15-19 = Moderately severe depression, 20-27 = Severe depression   Psychosocial Evaluation and Intervention:   Psychosocial Re-Evaluation:   Psychosocial Discharge (Final Psychosocial Re-Evaluation):   Vocational Rehabilitation: Provide vocational rehab assistance to qualifying candidates.   Vocational Rehab Evaluation & Intervention:  Vocational Rehab - 01/31/24 0809       Initial Vocational Rehab Evaluation & Intervention   Assessment shows need for Vocational Rehabilitation No   Pt is retired         Education: Education Goals: Education classes will be provided on a weekly basis, covering required topics. Participant will state understanding/return demonstration of topics presented.     Core Videos: Exercise    Move It!  Clinical staff conducted group or individual video education with verbal and written material and guidebook.  Patient learns the recommended Pritikin exercise  program. Exercise with the goal of living a long, healthy life. Some of the health benefits of exercise include controlled diabetes, healthier blood pressure levels, improved cholesterol levels, improved heart and lung capacity, improved sleep, and better body composition. Everyone should speak with their doctor before starting or changing an exercise routine.  Biomechanical Limitations Clinical staff conducted group or individual video education with verbal and written material and guidebook.  Patient learns how biomechanical limitations can impact exercise and how we can mitigate and possibly overcome limitations to have an impactful and balanced exercise routine.  Body Composition Clinical staff conducted group or individual video education with verbal and written material and guidebook.  Patient learns that body composition (ratio of muscle mass to fat mass) is a key component to assessing overall fitness, rather than body weight alone. Increased fat mass, especially visceral belly fat, can put us  at increased risk for metabolic syndrome, type 2 diabetes, heart disease, and even death. It is recommended to combine diet and exercise (cardiovascular and resistance training) to improve your body composition. Seek guidance from your physician and exercise physiologist before implementing an exercise routine.  Exercise Action Plan Clinical staff conducted group or individual video education with verbal and written material and guidebook.  Patient learns the recommended strategies to achieve and enjoy long-term exercise adherence, including variety, self-motivation, self-efficacy, and positive decision making. Benefits of exercise include fitness, good health, weight management, more energy, better sleep, less stress, and overall well-being.  Medical   Heart Disease Risk Reduction Clinical staff conducted group or individual video education with verbal and written material and guidebook.  Patient  learns our heart is our most vital organ as it circulates oxygen , nutrients, white blood cells, and hormones throughout the entire body, and carries waste away. Data supports a plant-based eating plan like the Pritikin Program for its effectiveness in slowing progression of and reversing heart disease. The video provides a number of recommendations to address heart disease.   Metabolic Syndrome and Belly Fat  Clinical staff conducted group or individual video education with verbal and written material and guidebook.  Patient learns what metabolic syndrome is, how it leads to heart disease, and how one can reverse it and keep it from coming back. You have metabolic syndrome if you have 3 of the following 5 criteria: abdominal obesity, high  blood pressure, high triglycerides, low HDL cholesterol, and high blood sugar.  Hypertension and Heart Disease Clinical staff conducted group or individual video education with verbal and written material and guidebook.  Patient learns that high blood pressure, or hypertension, is very common in the [place] . Hypertension is largely due to excessive salt intake, but other important risk factors include being overweight, physical inactivity, drinking too much alcohol , smoking, and not eating enough potassium from fruits and vegetables. High blood pressure is a leading risk factor for heart attack, stroke, congestive heart failure, dementia, kidney failure, and premature death. Long-term effects of excessive salt intake include stiffening of the arteries and thickening of heart muscle and organ damage. Recommendations include ways to reduce hypertension and the risk of heart disease.  Diseases of Our Time - Focusing on Diabetes Clinical staff conducted group or individual video education with verbal and written material and guidebook.  Patient learns why the best way to stop diseases of our time is prevention, through food and other lifestyle changes. Medicine (such  as prescription pills and surgeries) is often only a Band-Aid on the problem, not a long-term solution. Most common diseases of our time include obesity, type 2 diabetes, hypertension, heart disease, and cancer. The Pritikin Program is recommended and has been proven to help reduce, reverse, and/or prevent the damaging effects of metabolic syndrome.  Nutrition   Overview of the Pritikin Eating Plan  Clinical staff conducted group or individual video education with verbal and written material and guidebook.  Patient learns about the Pritikin Eating Plan for disease risk reduction. The Pritikin Eating Plan emphasizes a wide variety of unrefined, minimally-processed carbohydrates, like fruits, vegetables, whole grains, and legumes. Go, Caution, and Stop food choices are explained. Plant-based and lean animal proteins are emphasized. Rationale provided for low sodium intake for blood pressure control, low added sugars for blood sugar stabilization, and low added fats and oils for coronary artery disease risk reduction and weight management.  Calorie Density  Clinical staff conducted group or individual video education with verbal and written material and guidebook.  Patient learns about calorie density and how it impacts the Pritikin Eating Plan. Knowing the characteristics of the food you choose will help you decide whether those foods will lead to weight gain or weight loss, and whether you want to consume more or less of them. Weight loss is usually a side effect of the Pritikin Eating Plan because of its focus on low calorie-dense foods.  Label Reading  Clinical staff conducted group or individual video education with verbal and written material and guidebook.  Patient learns about the Pritikin recommended label reading guidelines and corresponding recommendations regarding calorie density, added sugars, sodium content, and whole grains.  Dining Out - Part 1  Clinical staff conducted group or  individual video education with verbal and written material and guidebook.  Patient learns that restaurant meals can be sabotaging because they can be so high in calories, fat, sodium, and/or sugar. Patient learns recommended strategies on how to positively address this and avoid unhealthy pitfalls.  Facts on Fats  Clinical staff conducted group or individual video education with verbal and written material and guidebook.  Patient learns that lifestyle modifications can be just as effective, if not more so, as many medications for lowering your risk of heart disease. A Pritikin lifestyle can help to reduce your risk of inflammation and atherosclerosis (cholesterol build-up, or plaque, in the artery walls). Lifestyle interventions such as dietary choices and physical activity address  the cause of atherosclerosis. A review of the types of fats and their impact on blood cholesterol levels, along with dietary recommendations to reduce fat intake is also included.  Nutrition Action Plan  Clinical staff conducted group or individual video education with verbal and written material and guidebook.  Patient learns how to incorporate Pritikin recommendations into their lifestyle. Recommendations include planning and keeping personal health goals in mind as an important part of their success.  Healthy Mind-Set    Healthy Minds, Bodies, Hearts  Clinical staff conducted group or individual video education with verbal and written material and guidebook.  Patient learns how to identify when they are stressed. Video will discuss the impact of that stress, as well as the many benefits of stress management. Patient will also be introduced to stress management techniques. The way we think, act, and feel has an impact on our hearts.  How Our Thoughts Can Heal Our Hearts  Clinical staff conducted group or individual video education with verbal and written material and guidebook.  Patient learns that negative thoughts  can cause depression and anxiety. This can result in negative lifestyle behavior and serious health problems. Cognitive behavioral therapy is an effective method to help control our thoughts in order to change and improve our emotional outlook.  Additional Videos:  Exercise    Improving Performance  Clinical staff conducted group or individual video education with verbal and written material and guidebook.  Patient learns to use a non-linear approach by alternating intensity levels and lengths of time spent exercising to help burn more calories and lose more body fat. Cardiovascular exercise helps improve heart health, metabolism, hormonal balance, blood sugar control, and recovery from fatigue. Resistance training improves strength, endurance, balance, coordination, reaction time, metabolism, and muscle mass. Flexibility exercise improves circulation, posture, and balance. Seek guidance from your physician and exercise physiologist before implementing an exercise routine and learn your capabilities and proper form for all exercise.  Introduction to Yoga  Clinical staff conducted group or individual video education with verbal and written material and guidebook.  Patient learns about yoga, a discipline of the coming together of mind, breath, and body. The benefits of yoga include improved flexibility, improved range of motion, better posture and core strength, increased lung function, weight loss, and positive self-image. Yoga's heart health benefits include lowered blood pressure, healthier heart rate, decreased cholesterol and triglyceride levels, improved immune function, and reduced stress. Seek guidance from your physician and exercise physiologist before implementing an exercise routine and learn your capabilities and proper form for all exercise.  Medical   Aging: Enhancing Your Quality of Life  Clinical staff conducted group or individual video education with verbal and written material and  guidebook.  Patient learns key strategies and recommendations to stay in good physical health and enhance quality of life, such as prevention strategies, having an advocate, securing a Health Care Proxy and Power of Attorney, and keeping a list of medications and system for tracking them. It also discusses how to avoid risk for bone loss.  Biology of Weight Control  Clinical staff conducted group or individual video education with verbal and written material and guidebook.  Patient learns that weight gain occurs because we consume more calories than we burn (eating more, moving less). Even if your body weight is normal, you may have higher ratios of fat compared to muscle mass. Too much body fat puts you at increased risk for cardiovascular disease, heart attack, stroke, type 2 diabetes, and obesity-related cancers. In  addition to exercise, following the Pritikin Eating Plan can help reduce your risk.  Decoding Lab Results  Clinical staff conducted group or individual video education with verbal and written material and guidebook.  Patient learns that lab test reflects one measurement whose values change over time and are influenced by many factors, including medication, stress, sleep, exercise, food, hydration, pre-existing medical conditions, and more. It is recommended to use the knowledge from this video to become more involved with your lab results and evaluate your numbers to speak with your doctor.   Diseases of Our Time - Overview  Clinical staff conducted group or individual video education with verbal and written material and guidebook.  Patient learns that according to the CDC, 50% to 70% of chronic diseases (such as obesity, type 2 diabetes, elevated lipids, hypertension, and heart disease) are avoidable through lifestyle improvements including healthier food choices, listening to satiety cues, and increased physical activity.  Sleep Disorders Clinical staff conducted group or individual  video education with verbal and written material and guidebook.  Patient learns how good quality and duration of sleep are important to overall health and well-being. Patient also learns about sleep disorders and how they impact health along with recommendations to address them, including discussing with a physician.  Nutrition  Dining Out - Part 2 Clinical staff conducted group or individual video education with verbal and written material and guidebook.  Patient learns how to plan ahead and communicate in order to maximize their dining experience in a healthy and nutritious manner. Included are recommended food choices based on the type of restaurant the patient is visiting.   Fueling a Banker conducted group or individual video education with verbal and written material and guidebook.  There is a strong connection between our food choices and our health. Diseases like obesity and type 2 diabetes are very prevalent and are in large-part due to lifestyle choices. The Pritikin Eating Plan provides plenty of food and hunger-curbing satisfaction. It is easy to follow, affordable, and helps reduce health risks.  Menu Workshop  Clinical staff conducted group or individual video education with verbal and written material and guidebook.  Patient learns that restaurant meals can sabotage health goals because they are often packed with calories, fat, sodium, and sugar. Recommendations include strategies to plan ahead and to communicate with the manager, chef, or server to help order a healthier meal.  Planning Your Eating Strategy  Clinical staff conducted group or individual video education with verbal and written material and guidebook.  Patient learns about the Pritikin Eating Plan and its benefit of reducing the risk of disease. The Pritikin Eating Plan does not focus on calories. Instead, it emphasizes high-quality, nutrient-rich foods. By knowing the characteristics of the  foods, we choose, we can determine their calorie density and make informed decisions.  Targeting Your Nutrition Priorities  Clinical staff conducted group or individual video education with verbal and written material and guidebook.  Patient learns that lifestyle habits have a tremendous impact on disease risk and progression. This video provides eating and physical activity recommendations based on your personal health goals, such as reducing LDL cholesterol, losing weight, preventing or controlling type 2 diabetes, and reducing high blood pressure.  Vitamins and Minerals  Clinical staff conducted group or individual video education with verbal and written material and guidebook.  Patient learns different ways to obtain key vitamins and minerals, including through a recommended healthy diet. It is important to discuss all supplements you take  with your doctor.   Healthy Mind-Set    Smoking Cessation  Clinical staff conducted group or individual video education with verbal and written material and guidebook.  Patient learns that cigarette smoking and tobacco addiction pose a serious health risk which affects millions of people. Stopping smoking will significantly reduce the risk of heart disease, lung disease, and many forms of cancer. Recommended strategies for quitting are covered, including working with your doctor to develop a successful plan.  Culinary   Becoming a Set designer conducted group or individual video education with verbal and written material and guidebook.  Patient learns that cooking at home can be healthy, cost-effective, quick, and puts them in control. Keys to cooking healthy recipes will include looking at your recipe, assessing your equipment needs, planning ahead, making it simple, choosing cost-effective seasonal ingredients, and limiting the use of added fats, salts, and sugars.  Cooking - Breakfast and Snacks  Clinical staff conducted group or  individual video education with verbal and written material and guidebook.  Patient learns how important breakfast is to satiety and nutrition through the entire day. Recommendations include key foods to eat during breakfast to help stabilize blood sugar levels and to prevent overeating at meals later in the day. Planning ahead is also a key component.  Cooking - Educational psychologist conducted group or individual video education with verbal and written material and guidebook.  Patient learns eating strategies to improve overall health, including an approach to cook more at home. Recommendations include thinking of animal protein as a side on your plate rather than center stage and focusing instead on lower calorie dense options like vegetables, fruits, whole grains, and plant-based proteins, such as beans. Making sauces in large quantities to freeze for later and leaving the skin on your vegetables are also recommended to maximize your experience.  Cooking - Healthy Salads and Dressing Clinical staff conducted group or individual video education with verbal and written material and guidebook.  Patient learns that vegetables, fruits, whole grains, and legumes are the foundations of the Pritikin Eating Plan. Recommendations include how to incorporate each of these in flavorful and healthy salads, and how to create homemade salad dressings. Proper handling of ingredients is also covered. Cooking - Soups and [employer] - Soups and Desserts Clinical staff conducted group or individual video education with verbal and written material and guidebook.  Patient learns that Pritikin soups and desserts make for easy, nutritious, and delicious snacks and meal components that are low in sodium, fat, sugar, and calorie density, while high in vitamins, minerals, and filling fiber. Recommendations include simple and healthy ideas for soups and desserts.   Overview     The Pritikin Solution Program  Overview Clinical staff conducted group or individual video education with verbal and written material and guidebook.  Patient learns that the results of the Pritikin Program have been documented in more than 100 articles published in peer-reviewed journals, and the benefits include reducing risk factors for (and, in some cases, even reversing) high cholesterol, high blood pressure, type 2 diabetes, obesity, and more! An overview of the three key pillars of the Pritikin Program will be covered: eating well, doing regular exercise, and having a healthy mind-set.  WORKSHOPS  Exercise: Exercise Basics: Building Your Action Plan Clinical staff led group instruction and group discussion with PowerPoint presentation and patient guidebook. To enhance the learning environment the use of posters, models and videos may be added. At the  conclusion of this workshop, patients will comprehend the difference between physical activity and exercise, as well as the benefits of incorporating both, into their routine. Patients will understand the FITT (Frequency, Intensity, Time, and Type) principle and how to use it to build an exercise action plan. In addition, safety concerns and other considerations for exercise and cardiac rehab will be addressed by the presenter. The purpose of this lesson is to promote a comprehensive and effective weekly exercise routine in order to improve patients' overall level of fitness.   Managing Heart Disease: Your Path to a Healthier Heart Clinical staff led group instruction and group discussion with PowerPoint presentation and patient guidebook. To enhance the learning environment the use of posters, models and videos may be added.At the conclusion of this workshop, patients will understand the anatomy and physiology of the heart. Additionally, they will understand how Pritikin's three pillars impact the risk factors, the progression, and the management of heart disease.  The  purpose of this lesson is to provide a high-level overview of the heart, heart disease, and how the Pritikin lifestyle positively impacts risk factors.  Exercise Biomechanics Clinical staff led group instruction and group discussion with PowerPoint presentation and patient guidebook. To enhance the learning environment the use of posters, models and videos may be added. Patients will learn how the structural parts of their bodies function and how these functions impact their daily activities, movement, and exercise. Patients will learn how to promote a neutral spine, learn how to manage pain, and identify ways to improve their physical movement in order to promote healthy living. The purpose of this lesson is to expose patients to common physical limitations that impact physical activity. Participants will learn practical ways to adapt and manage aches and pains, and to minimize their effect on regular exercise. Patients will learn how to maintain good posture while sitting, walking, and lifting.  Balance Training and Fall Prevention  Clinical staff led group instruction and group discussion with PowerPoint presentation and patient guidebook. To enhance the learning environment the use of posters, models and videos may be added. At the conclusion of this workshop, patients will understand the importance of their sensorimotor skills (vision, proprioception, and the vestibular system) in maintaining their ability to balance as they age. Patients will apply a variety of balancing exercises that are appropriate for their current level of function. Patients will understand the common causes for poor balance, possible solutions to these problems, and ways to modify their physical environment in order to minimize their fall risk. The purpose of this lesson is to teach patients about the importance of maintaining balance as they age and ways to minimize their risk of falling.  WORKSHOPS   Nutrition:   Fueling a Ship broker led group instruction and group discussion with PowerPoint presentation and patient guidebook. To enhance the learning environment the use of posters, models and videos may be added. Patients will review the foundational principles of the Pritikin Eating Plan and understand what constitutes a serving size in each of the food groups. Patients will also learn Pritikin-friendly foods that are better choices when away from home and review make-ahead meal and snack options. Calorie density will be reviewed and applied to three nutrition priorities: weight maintenance, weight loss, and weight gain. The purpose of this lesson is to reinforce (in a group setting) the key concepts around what patients are recommended to eat and how to apply these guidelines when away from home by planning and selecting Pritikin-friendly  options. Patients will understand how calorie density may be adjusted for different weight management goals.  Mindful Eating  Clinical staff led group instruction and group discussion with PowerPoint presentation and patient guidebook. To enhance the learning environment the use of posters, models and videos may be added. Patients will briefly review the concepts of the Pritikin Eating Plan and the importance of low-calorie dense foods. The concept of mindful eating will be introduced as well as the importance of paying attention to internal hunger signals. Triggers for non-hunger eating and techniques for dealing with triggers will be explored. The purpose of this lesson is to provide patients with the opportunity to review the basic principles of the Pritikin Eating Plan, discuss the value of eating mindfully and how to measure internal cues of hunger and fullness using the Hunger Scale. Patients will also discuss reasons for non-hunger eating and learn strategies to use for controlling emotional eating.  Targeting Your Nutrition Priorities Clinical staff led  group instruction and group discussion with PowerPoint presentation and patient guidebook. To enhance the learning environment the use of posters, models and videos may be added. Patients will learn how to determine their genetic susceptibility to disease by reviewing their family history. Patients will gain insight into the importance of diet as part of an overall healthy lifestyle in mitigating the impact of genetics and other environmental insults. The purpose of this lesson is to provide patients with the opportunity to assess their personal nutrition priorities by looking at their family history, their own health history and current risk factors. Patients will also be able to discuss ways of prioritizing and modifying the Pritikin Eating Plan for their highest risk areas  Menu  Clinical staff led group instruction and group discussion with PowerPoint presentation and patient guidebook. To enhance the learning environment the use of posters, models and videos may be added. Using menus brought in from [employer], or printed from [employer], patients will apply the Pritikin dining out guidelines that were presented in the [employer] video. Patients will also be able to practice these guidelines in a variety of provided scenarios. The purpose of this lesson is to provide patients with the opportunity to practice hands-on learning of the Pritikin Dining Out guidelines with actual menus and practice scenarios.  Label Reading Clinical staff led group instruction and group discussion with PowerPoint presentation and patient guidebook. To enhance the learning environment the use of posters, models and videos may be added. Patients will review and discuss the Pritikin label reading guidelines presented in Pritikin's Label Reading Educational series video. Using fool labels brought in from local grocery stores and markets, patients will apply the label reading guidelines and determine  if the packaged food meet the Pritikin guidelines. The purpose of this lesson is to provide patients with the opportunity to review, discuss, and practice hands-on learning of the Pritikin Label Reading guidelines with actual packaged food labels. Cooking School  Pritikin's [employer] are designed to teach patients ways to prepare quick, simple, and affordable recipes at home. The importance of nutrition's role in chronic disease risk reduction is reflected in its emphasis in the overall Pritikin program. By learning how to prepare essential core Pritikin Eating Plan recipes, patients will increase control over what they eat; be able to customize the flavor of foods without the use of added salt, sugar, or fat; and improve the quality of the food they consume. By learning a set of core recipes which are easily assembled,  quickly prepared, and affordable, patients are more likely to prepare more healthy foods at home. These workshops focus on convenient breakfasts, simple entres, side dishes, and desserts which can be prepared with minimal effort and are consistent with nutrition recommendations for cardiovascular risk reduction. Cooking [employer] are taught by a Armed forces logistics/support/administrative officer (RD) who has been trained by the [employer]. The chef or RD has a clear understanding of the importance of minimizing - if not completely eliminating - added fat, sugar, and sodium in recipes. Throughout the series of Cooking School Workshop sessions, patients will learn about healthy ingredients and efficient methods of cooking to build confidence in their capability to prepare    Cooking School weekly topics:  Adding Flavor- Sodium-Free  Fast and Healthy Breakfasts  Powerhouse Plant-Based Proteins  Satisfying Salads and Dressings  Simple Sides and Sauces  International Cuisine-Spotlight on the [employer] Zones  Delicious Desserts  Savory Soups  [employer] - Meals in a  Astronomer Appetizers and Snacks  Comforting Weekend Breakfasts  One-Pot Wonders   Fast Evening Meals  Landscape architect Your Pritikin Plate  WORKSHOPS   Healthy Mindset (Psychosocial):  Focused Goals, Sustainable Changes Clinical staff led group instruction and group discussion with PowerPoint presentation and patient guidebook. To enhance the learning environment the use of posters, models and videos may be added. Patients will be able to apply effective goal setting strategies to establish at least one personal goal, and then take consistent, meaningful action toward that goal. They will learn to identify common barriers to achieving personal goals and develop strategies to overcome them. Patients will also gain an understanding of how our mind-set can impact our ability to achieve goals and the importance of cultivating a positive and growth-oriented mind-set. The purpose of this lesson is to provide patients with a deeper understanding of how to set and achieve personal goals, as well as the tools and strategies needed to overcome common obstacles which may arise along the way.  From Head to Heart: The Power of a Healthy Outlook  Clinical staff led group instruction and group discussion with PowerPoint presentation and patient guidebook. To enhance the learning environment the use of posters, models and videos may be added. Patients will be able to recognize and describe the impact of emotions and mood on physical health. They will discover the importance of self-care and explore self-care practices which may work for them. Patients will also learn how to utilize the 4 C's to cultivate a healthier outlook and better manage stress and challenges. The purpose of this lesson is to demonstrate to patients how a healthy outlook is an essential part of maintaining good health, especially as they continue their cardiac rehab journey.  Healthy Sleep for a Healthy Heart Clinical staff  led group instruction and group discussion with PowerPoint presentation and patient guidebook. To enhance the learning environment the use of posters, models and videos may be added. At the conclusion of this workshop, patients will be able to demonstrate knowledge of the importance of sleep to overall health, well-being, and quality of life. They will understand the symptoms of, and treatments for, common sleep disorders. Patients will also be able to identify daytime and nighttime behaviors which impact sleep, and they will be able to apply these tools to help manage sleep-related challenges. The purpose of this lesson is to provide patients with a general overview of sleep and outline the importance of quality sleep. Patients will learn about  a few of the most common sleep disorders. Patients will also be introduced to the concept of "sleep hygiene," and discover ways to self-manage certain sleeping problems through simple daily behavior changes. Finally, the workshop will motivate patients by clarifying the links between quality sleep and their goals of heart-healthy living.   Recognizing and Reducing Stress Clinical staff led group instruction and group discussion with PowerPoint presentation and patient guidebook. To enhance the learning environment the use of posters, models and videos may be added. At the conclusion of this workshop, patients will be able to understand the types of stress reactions, differentiate between acute and chronic stress, and recognize the impact that chronic stress has on their health. They will also be able to apply different coping mechanisms, such as reframing negative self-talk. Patients will have the opportunity to practice a variety of stress management techniques, such as deep abdominal breathing, progressive muscle relaxation, and/or guided imagery.  The purpose of this lesson is to educate patients on the role of stress in their lives and to provide healthy techniques  for coping with it.  Learning Barriers/Preferences:  Learning Barriers/Preferences - 01/31/24 1008       Learning Barriers/Preferences   Learning Barriers Sight   wears glasses   Learning Preferences Audio;Computer/Internet;Group Instruction;Individual Instruction;Pictoral;Skilled Demonstration;Verbal Instruction;Video;Written Material          Education Topics:  Knowledge Questionnaire Score:  Knowledge Questionnaire Score - 01/31/24 1105       Knowledge Questionnaire Score   Pre Score 18/24          Core Components/Risk Factors/Patient Goals at Admission:  Personal Goals and Risk Factors at Admission - 01/31/24 1006       Core Components/Risk Factors/Patient Goals on Admission    Weight Management Obesity;Weight Loss    Hypertension Yes    Intervention Provide education on lifestyle modifcations including regular physical activity/exercise, weight management, moderate sodium restriction and increased consumption of fresh fruit, vegetables, and low fat dairy, alcohol  moderation, and smoking cessation.;Monitor prescription use compliance.    Expected Outcomes Short Term: Continued assessment and intervention until BP is < 140/7mm HG in hypertensive participants. < 130/47mm HG in hypertensive participants with diabetes, heart failure or chronic kidney disease.;Long Term: Maintenance of blood pressure at goal levels.    Lipids Yes    Intervention Provide education and support for participant on nutrition & aerobic/resistive exercise along with prescribed medications to achieve LDL 70mg , HDL >40mg .    Expected Outcomes Short Term: Participant states understanding of desired cholesterol values and is compliant with medications prescribed. Participant is following exercise prescription and nutrition guidelines.;Long Term: Cholesterol controlled with medications as prescribed, with individualized exercise RX and with personalized nutrition plan. Value goals: LDL < 70mg , HDL > 40 mg.     Stress Yes    Intervention Offer individual and/or small group education and counseling on adjustment to heart disease, stress management and health-related lifestyle change. Teach and support self-help strategies.;Refer participants experiencing significant psychosocial distress to appropriate mental health specialists for further evaluation and treatment. When possible, include family members and significant others in education/counseling sessions.    Expected Outcomes Short Term: Participant demonstrates changes in health-related behavior, relaxation and other stress management skills, ability to obtain effective social support, and compliance with psychotropic medications if prescribed.;Long Term: Emotional wellbeing is indicated by absence of clinically significant psychosocial distress or social isolation.          Core Components/Risk Factors/Patient Goals Review:    Core Components/Risk Factors/Patient Goals at  Discharge (Final Review):    ITP Comments:  ITP Comments     Row Name 01/31/24 0807           ITP Comments Wilbert Bihari, MD: Medical Director.  Introduction to the Pritikin Education Program/Intensive Cardiac Rehab.  Initial orientation packet reviewed with the patient.          Comments: Participant attended orientation for the cardiac rehabilitation program on  01/31/2024  to perform initial intake and exercise walk test. Patient introduced to the Pritikin Program education and orientation packet was reviewed. Completed 6-minute walk test, measurements, initial ITP, and exercise prescription. Vital signs stable. Telemetry-paced rhythm, asymptomatic.   Service time was from 10:02 to 10:30.

## 2024-02-01 ENCOUNTER — Ambulatory Visit (HOSPITAL_COMMUNITY)

## 2024-02-03 ENCOUNTER — Ambulatory Visit (HOSPITAL_COMMUNITY)

## 2024-02-06 ENCOUNTER — Ambulatory Visit (HOSPITAL_COMMUNITY)

## 2024-02-06 ENCOUNTER — Encounter (HOSPITAL_COMMUNITY)
Admission: RE | Admit: 2024-02-06 | Discharge: 2024-02-06 | Disposition: A | Discharge status: Home / Self Care | Source: Ambulatory Visit | Attending: Cardiovascular Disease | Admitting: Cardiovascular Disease

## 2024-02-06 DIAGNOSIS — Z952 Presence of prosthetic heart valve: Secondary | ICD-10-CM | POA: Diagnosis present

## 2024-02-06 NOTE — Progress Notes (Signed)
 Daily Session Note  Patient Details  Name: Carolyn Rowland MRN: [id number] Date of Birth: 12-17-1940 Referring Provider:   Flowsheet Row INTENSIVE CARDIAC REHAB ORIENT from 01/31/2024 in [facility]  Referring Provider Ozell Fell, MD    Encounter Date: 02/06/2024  Check In:  Session Check In - 02/06/24 1515       Check-In   Supervising physician immediately available to respond to emergencies [facility] MD immediately available    Physician(s) Josefa Beauvais, NP    Location [facility]    Staff Present Alm Parkins, MS, ACSM-CEP, CCRP, Exercise Physiologist;Bailey Elnor, MS, Exercise Physiologist;Jetta Vannie BS, ACSM-CEP, Exercise Physiologist;Jelitza Manninen, RN, Valere Music, RN, Avonne Gal, MS, ACSM-CEP, Exercise Physiologist    Virtual Visit No    Medication changes reported     No    Fall or balance concerns reported    No    Tobacco Cessation No Change    Warm-up and Cool-down Performed as group-led instruction    Resistance Training Performed Yes    VAD Patient? No    PAD/SET Patient? No      Pain Assessment   Currently in Pain? No/denies    Pain Score 0-No pain    Multiple Pain Sites No          Capillary Blood Glucose: No results found for this or any previous visit (from the past 24 hours).   Exercise Prescription Changes - 02/06/24 1647       Response to Exercise   Blood Pressure (Admit) 98/60    Blood Pressure (Exercise) 140/82    Blood Pressure (Exit) 142/82    Heart Rate (Admit) 69 bpm    Heart Rate (Exercise) 88 bpm    Heart Rate (Exit) 68 bpm    Rating of Perceived Exertion (Exercise) 9    Perceived Dyspnea (Exercise) 0    Symptoms Fatigue, no weights today.    Comments Pt first day in the [facility]CR program    Duration Progress to 30 minutes of  aerobic without signs/symptoms of physical distress    Intensity THRR unchanged      Progression   Progression  Continue to progress workloads to maintain intensity without signs/symptoms of physical distress.    Average METs 1.7      Resistance Training   Training Prescription No    Weight 2 lbs    Reps 10-15    Time 10 Minutes      NuStep   Level 1    SPM 65    Minutes 24    METs 1.7          Social History   Tobacco Use  Smoking Status Former   Current packs/day: 0.00   Average packs/day: 1 pack/day for 52.0 years (52.0 ttl pk-yrs)   Types: Cigarettes   Start date: 04/04/1962   Quit date: 04/04/2014   Years since quitting: 9.8  Smokeless Tobacco Never    Goals Met:  Completed first day of exercise did not do weight training due to fatigue.  Goals Unmet:  Not Applicable  Comments: Pt started cardiac rehab today.  Pt VSS, telemetry-Sinus rhythm with  V paced on demand, Jacklynn denied feeling lightheaded but reported feeling fatigued after exercising on the nustep for 24 minutes. Jordynn did not do weights today.  Medication list reconciled. Pt denies barriers to medicaiton compliance.  PSYCHOSOCIAL ASSESSMENT:  PHQ-7. Pt exhibits positive coping skills, hopeful outlook with supportive family.  No psychosocial needs identified at this time, no psychosocial interventions necessary.    Pt enjoys playing bingo, painting, going to church and singing.   Pt oriented to exercise equipment and routine.    Understanding verbalized. [name] RN BSN    Dr. Wilbert Bihari is Medical Director for Cardiac Rehab at [facility].

## 2024-02-08 ENCOUNTER — Ambulatory Visit (HOSPITAL_COMMUNITY)

## 2024-02-08 ENCOUNTER — Telehealth (HOSPITAL_COMMUNITY): Payer: Self-pay

## 2024-02-08 ENCOUNTER — Encounter (HOSPITAL_COMMUNITY)
Admission: RE | Admit: 2024-02-08 | Discharge: 2024-02-08 | Disposition: A | Discharge status: Home / Self Care | Source: Ambulatory Visit | Attending: Cardiovascular Disease | Admitting: Cardiovascular Disease

## 2024-02-08 NOTE — Telephone Encounter (Signed)
 Patient c/o for 1:45 CR class due to rain.

## 2024-02-10 ENCOUNTER — Other Ambulatory Visit: Payer: Self-pay

## 2024-02-10 ENCOUNTER — Encounter (HOSPITAL_COMMUNITY): Payer: Self-pay

## 2024-02-10 ENCOUNTER — Emergency Department (HOSPITAL_COMMUNITY)
Admission: EM | Admit: 2024-02-10 | Discharge: 2024-02-10 | Disposition: A | Discharge status: Home / Self Care | Attending: Emergency Medicine | Admitting: Emergency Medicine

## 2024-02-10 ENCOUNTER — Encounter (HOSPITAL_COMMUNITY)
Admission: RE | Admit: 2024-02-10 | Discharge: 2024-02-10 | Disposition: A | Discharge status: Home / Self Care | Source: Ambulatory Visit | Attending: Cardiovascular Disease | Admitting: Cardiovascular Disease

## 2024-02-10 ENCOUNTER — Ambulatory Visit (HOSPITAL_COMMUNITY)

## 2024-02-10 DIAGNOSIS — Z950 Presence of cardiac pacemaker: Secondary | ICD-10-CM | POA: Insufficient documentation

## 2024-02-10 DIAGNOSIS — Z952 Presence of prosthetic heart valve: Secondary | ICD-10-CM

## 2024-02-10 DIAGNOSIS — R350 Frequency of micturition: Secondary | ICD-10-CM | POA: Insufficient documentation

## 2024-02-10 DIAGNOSIS — I2510 Atherosclerotic heart disease of native coronary artery without angina pectoris: Secondary | ICD-10-CM | POA: Diagnosis not present

## 2024-02-10 DIAGNOSIS — Z79899 Other long term (current) drug therapy: Secondary | ICD-10-CM | POA: Insufficient documentation

## 2024-02-10 DIAGNOSIS — I10 Essential (primary) hypertension: Secondary | ICD-10-CM | POA: Insufficient documentation

## 2024-02-10 DIAGNOSIS — R42 Dizziness and giddiness: Secondary | ICD-10-CM | POA: Diagnosis present

## 2024-02-10 DIAGNOSIS — R55 Syncope and collapse: Secondary | ICD-10-CM | POA: Insufficient documentation

## 2024-02-10 LAB — URINALYSIS, ROUTINE W REFLEX MICROSCOPIC
Bilirubin Urine: NEGATIVE
Glucose, UA: NEGATIVE mg/dL
Ketones, ur: NEGATIVE mg/dL
Leukocytes,Ua: NEGATIVE
Nitrite: NEGATIVE
Protein, ur: NEGATIVE mg/dL
Specific Gravity, Urine: 1.002 — ABNORMAL LOW (ref 1.005–1.030)
pH: 8 (ref 5.0–8.0)

## 2024-02-10 LAB — BASIC METABOLIC PANEL WITH GFR
Anion gap: 7 (ref 5–15)
BUN: 10 mg/dL (ref 8–23)
CO2: 28 mmol/L (ref 22–32)
Calcium: 9.6 mg/dL (ref 8.9–10.3)
Chloride: 104 mmol/L (ref 98–111)
Creatinine, Ser: 0.83 mg/dL (ref 0.44–1.00)
GFR, Estimated: >60 mL/min (ref >60)
Glucose, Bld: 113 mg/dL — ABNORMAL HIGH (ref 70–99)
Potassium: 4.6 mmol/L (ref 3.5–5.1)
Sodium: 139 mmol/L (ref 135–145)

## 2024-02-10 LAB — CBC WITH DIFFERENTIAL/PLATELET
Abs Immature Granulocytes: 0.01 K/uL (ref 0.00–0.07)
Basophils Absolute: 0 K/uL (ref 0.0–0.1)
Basophils Relative: 1 %
Eosinophils Absolute: 0.1 K/uL (ref 0.0–0.5)
Eosinophils Relative: 1 %
HCT: 43.7 % (ref 36.0–46.0)
Hemoglobin: 14.7 g/dL (ref 12.0–15.0)
Immature Granulocytes: 0 %
Lymphocytes Relative: 34 %
Lymphs Abs: 1.8 K/uL (ref 0.7–4.0)
MCH: 26.6 pg (ref 26.0–34.0)
MCHC: 33.6 g/dL (ref 30.0–36.0)
MCV: 79.2 fL — ABNORMAL LOW (ref 80.0–100.0)
Monocytes Absolute: 0.4 K/uL (ref 0.1–1.0)
Monocytes Relative: 8 %
Neutro Abs: 2.9 K/uL (ref 1.7–7.7)
Neutrophils Relative %: 56 %
Platelets: 130 K/uL — ABNORMAL LOW (ref 150–400)
RBC: 5.52 MIL/uL — ABNORMAL HIGH (ref 3.87–5.11)
RDW: 15.8 % — ABNORMAL HIGH (ref 11.5–15.5)
WBC: 5.2 K/uL (ref 4.0–10.5)
nRBC: 0 % (ref 0.0–0.2)

## 2024-02-10 LAB — GLUCOSE, CAPILLARY: Glucose-Capillary: 92 mg/dL (ref 70–99)

## 2024-02-10 LAB — TROPONIN I (HIGH SENSITIVITY): Troponin I (High Sensitivity): 7 ng/L (ref <18)

## 2024-02-10 NOTE — Discharge Instructions (Signed)
 Follow-up with your cardiologist.  Return for any concerning symptoms.  Also recommend you follow-up with your primary care doctor.  No concerning cause of your lightheadedness or urinary frequency.  No evidence of infection. We discussed additional testing however you declined at this time.  I recommend you follow-up with your cardiologist and PCP for reevaluation.  For any concerning symptoms please return to the emergency department.

## 2024-02-10 NOTE — ED Notes (Signed)
 [facility] Coming from Cardiac Rehab   Pt was on the eliptical when she started to feel dizzy, staff stopped the procedure. Patient became anxious, hyperventilating. Pt also stating she has had urinary frequency and urgency and is concerned she has a UTI.  Denies Chest pain and SOB, N/V.   Patient is AxOx4  BP 188/80 HR 65 RR 16 O2 97% RA   CBG 92  ECG NSR with EMS and also while on the elliptical at the cardiac rehab.

## 2024-02-10 NOTE — ED Notes (Signed)
 PT does not want to be hooked up to the monitor at this time and prefers to sit in a chair.

## 2024-02-10 NOTE — ED Provider Notes (Signed)
 [place] EMERGENCY DEPARTMENT AT [facility] Provider Note   CSN: [id number] Arrival date & time: 02/10/24  8367     Patient presents with: Dizziness and Urinary Frequency   Carolyn Rowland is a 83 y.o. female.   83 year old female presents today for concern of lightheadedness episode she had earlier while she was at cardiac rehab.  Also she endorses urinary frequency which is started today.  She is concerned for UTI. She does not plan to stay in the emergency department longer.  Refuses most of the workup.  Daughter is at bedside.  She is agreeable to have some blood work and a urine obtained.  Currently asymptomatic outside of the urinary frequency.  She denies chest pain, shortness of breath.  Does have a cardiologist established.  She does have a pacemaker as well.  Does not want this interrogated.  She states she has a machine at home which alerts her for any concerning rhythms.  The history is provided by the patient. No language interpreter was used.       Prior to Admission medications   Medication Sig Start Date End Date Taking? Authorizing Provider  amLODipine  (NORVASC ) 2.5 MG tablet Take 2.5 mg by mouth daily.    [provider]  aspirin  81 MG EC tablet Take 81 mg by mouth daily.     [provider]  azithromycin  (ZITHROMAX ) 500 MG tablet Take 1 tablet (500 mg total) by mouth as directed. Take one tablet 1 hour before any dental work including cleanings. 10/26/23   Sebastian Lamarr SAUNDERS, PA-C  cholecalciferol  (VITAMIN D3) 25 MCG (1000 UNIT) tablet Take 1,000 Units by mouth daily.    [provider]  isosorbide  mononitrate (IMDUR ) 60 MG 24 hr tablet Take 1 tablet (60 mg total) by mouth daily. 08/07/13   Ladona Heinz, MD  LINZESS 72 MCG capsule Take 72 mcg by mouth daily as needed (constipation). 07/29/23   [provider]  metoprolol  succinate (TOPROL -XL) 50 MG 24 hr tablet Take 50 mg by mouth daily. Take with or immediately following a  meal.    [provider]  Multiple Minerals-Vitamins (CALCIUM  & VIT D3 BONE HEALTH PO) Take 1 tablet by mouth daily.    [provider]  nitroGLYCERIN  (NITROSTAT ) 0.4 MG SL tablet Place 1 tablet (0.4 mg total) under the tongue every 5 (five) minutes as needed for chest pain. 08/07/13   Ladona Heinz, MD  omeprazole (PRILOSEC) 40 MG capsule Take 40 mg by mouth daily as needed (acid reflux).  11/14/17   [provider]  rosuvastatin  (CRESTOR ) 20 MG tablet Take 20 mg by mouth daily. 12/30/14   [provider]  spironolactone  (ALDACTONE ) 25 MG tablet Take 12.5 mg by mouth daily. Patient taking differently: Take 12.5 mg by mouth daily.    [provider]    Allergies: Penicillins    Review of Systems  Constitutional:  Negative for chills and fever.  Eyes:  Negative for visual disturbance.  Respiratory:  Negative for shortness of breath.   Cardiovascular:  Negative for chest pain and leg swelling.  Gastrointestinal:  Negative for abdominal pain.  Genitourinary:  Positive for frequency. Negative for difficulty urinating and dysuria.  Neurological:  Positive for light-headedness.  All other systems reviewed and are negative.   Updated Vital Signs BP (!) 174/95   Pulse 71   Temp (!) 97.5 F (36.4 C) (Oral)   Resp 18   Ht 5' 5 (1.651 m)   Wt 93.9 kg  SpO2 100%   BMI 34.45 kg/m   Physical Exam Vitals and nursing note reviewed.  Constitutional:      General: She is not in acute distress.    Appearance: Normal appearance. She is not ill-appearing.  HENT:     Head: Normocephalic and atraumatic.     Nose: Nose normal.  Eyes:     Conjunctiva/sclera: Conjunctivae normal.  Cardiovascular:     Rate and Rhythm: Normal rate and regular rhythm.  Pulmonary:     Effort: Pulmonary effort is normal. No respiratory distress.  Abdominal:     General: There is no distension.     Palpations: Abdomen is soft.     Tenderness: There is no abdominal  tenderness. There is no right CVA tenderness or left CVA tenderness.  Musculoskeletal:        General: No deformity. Normal range of motion.  Skin:    Findings: No rash.  Neurological:     Mental Status: She is alert.     (all labs ordered are listed, but only abnormal results are displayed) Labs Reviewed  CBC WITH DIFFERENTIAL/PLATELET - Abnormal; Notable for the following components:      Result Value   RBC 5.52 (*)    MCV 79.2 (*)    RDW 15.8 (*)    Platelets 130 (*)    All other components within normal limits  BASIC METABOLIC PANEL WITH GFR - Abnormal; Notable for the following components:   Glucose, Bld 113 (*)    All other components within normal limits  URINALYSIS, ROUTINE W REFLEX MICROSCOPIC - Abnormal; Notable for the following components:   Color, Urine COLORLESS (*)    Specific Gravity, Urine 1.002 (*)    Hgb urine dipstick SMALL (*)    Bacteria, UA RARE (*)    All other components within normal limits  TROPONIN I (HIGH SENSITIVITY)  TROPONIN I (HIGH SENSITIVITY)    EKG: None  Radiology: No results found.   Procedures   Medications Ordered in the ED - No data to display  Clinical Course as of 02/10/24 2010  Fri Feb 10, 2024  8082 Patient appears well.  CBC reveals no leukocytosis or anemia.  UA without evidence of UTI. We had an extensive discussion during my initial discussion with patient and daughter regarding additional workup including chest x-ray, pacemaker interrogation, CT of the head however patient declines and states she just wants her urine checked.  However while she is having her urine checked she did agree to basic blood work.  She does have a cardiologist that she is established with. [AA]    Clinical Course User Index [AA] Hildegard Loge, PA-C                                 Medical Decision Making Amount and/or Complexity of Data Reviewed Labs: ordered.   Medical Decision Making / ED Course   This patient presents to the ED for  concern of lightheadedness, urinary frequency, this involves an extensive number of treatment options, and is a complaint that carries with it a high risk of complications and morbidity.  The differential diagnosis includes arrhythmia, dehydration,  MDM: 83 year old female presents today for concern of lightheadedness episode.  This occurred at cardiac rehab while she was on the elliptical.  Kommer dehydration or orthostasis.  However she drinks plenty of fluid and is having urinary frequency with a urinal at bedside which shows clear urine.  This is only occurred once.  Denies any palpitations, chest pain, shortness of breath or other anginal symptoms. Also endorses urinary frequency and would like her urine tested. UA without evidence of UTI. CBC without acute concern.  BMP shows no acute electrolyte derangement or other acute concern.  Troponin negative.  She is without chest pain or other anginal symptoms. EKG without acute ischemic change.  Low suspicion for ACS. Did not want pacemaker interrogated.  I did offer and she declined CT head as well. Does not want to stay for additional blood work.  It was hard enough to convince her to stay for the workup that has been done. Remains without symptoms.  She is feeling okay for discharge. Discharged in stable condition.  Return precautions discussed.  Discussed close follow-up with PCP and cardiology.   Additional history obtained: -Additional history obtained from daughter who was at bedside -External records from outside source obtained and reviewed including: Chart review including previous notes, labs, imaging, consultation notes   Lab Tests: -I ordered, reviewed, and interpreted labs.   The pertinent results include:   Labs Reviewed  CBC WITH DIFFERENTIAL/PLATELET - Abnormal; Notable for the following components:      Result Value   RBC 5.52 (*)    MCV 79.2 (*)    RDW 15.8 (*)    Platelets 130 (*)    All other components within normal  limits  BASIC METABOLIC PANEL WITH GFR - Abnormal; Notable for the following components:   Glucose, Bld 113 (*)    All other components within normal limits  URINALYSIS, ROUTINE W REFLEX MICROSCOPIC - Abnormal; Notable for the following components:   Color, Urine COLORLESS (*)    Specific Gravity, Urine 1.002 (*)    Hgb urine dipstick SMALL (*)    Bacteria, UA RARE (*)    All other components within normal limits  TROPONIN I (HIGH SENSITIVITY)      EKG  EKG Interpretation Date/Time:    Ventricular Rate:    PR Interval:    QRS Duration:    QT Interval:    QTC Calculation:   R Axis:      Text Interpretation:           Imaging Studies ordered: I ordered imaging studies including declined chest x-ray I independently visualized and interpreted imaging. I agree with the radiologist interpretation   Medicines ordered and prescription drug management: No orders of the defined types were placed in this encounter.   -I have reviewed the patients home medicines and have made adjustments as needed  Reevaluation: After the interventions noted above, I reevaluated the patient and found that they have :resolved  Co morbidities that complicate the patient evaluation  Past Medical History:  Diagnosis Date   Anemia    in younger years   Arthritis    hands; legs (08/17/2013)   Coronary artery disease    6 stents in 2015   GERD (gastroesophageal reflux disease)    H/O hiatal hernia    High cholesterol    Hx of adenomatous colonic polyps 08/15/2018   Hypertension    Radiation 11/28/14-01/02/15   Right breast 52 Carolyn   S/P TAVR (transcatheter aortic valve replacement)    Severe aortic stenosis    Vertigo       Dispostion: Discharged in stable condition.  Return precaution discussed.  Patient voices understanding and is in agreement with the plan.  Final diagnoses:  Near syncope  Urinary frequency    ED Discharge Orders  None          Hildegard Loge,  PA-C 02/10/24 2351    Carolyn Bernarda SQUIBB, DO 02/18/24 1158

## 2024-02-10 NOTE — Progress Notes (Signed)
 Pt c/o feeling dizzy & lightheaded like she was going to pass out during cardiac rehab session.  BP obtained,152/96 sitting, 144/92 standing, BGL 92.  Pt stated she had not eaten since 6AM. Graham crackers and soda given to patient & encouraged to hydrate with water and soda. Pt stated she still feels dizzy, like she's going to the restroom a lot, and also stated, I feel fuzzy in my thinking and like I might be getting a UTI.  Onsite APP notified, EMS called, pt in the process of being transported to ED for further evaluation.

## 2024-02-13 ENCOUNTER — Telehealth (HOSPITAL_COMMUNITY): Payer: Self-pay | Admitting: *Deleted

## 2024-02-13 ENCOUNTER — Encounter (HOSPITAL_COMMUNITY): Admission: RE | Admit: 2024-02-13 | Source: Ambulatory Visit

## 2024-02-13 ENCOUNTER — Ambulatory Visit (HOSPITAL_COMMUNITY)

## 2024-02-13 NOTE — Telephone Encounter (Signed)
 Spoke with Carolyn Rowland she is at home resting will check with her primary cardiologist Dr Levern about resuming exercise at cardiac rehab. Patient states understanding.[name] RN BSN

## 2024-02-13 NOTE — Telephone Encounter (Signed)
 Left message to call cardiac rehab. Carolyn Rowland will need cardiology clearance to return to cardiac rehab due to symptoms noted on Friday and recent ED visit.[name] RN BSN

## 2024-02-13 NOTE — Progress Notes (Signed)
 Cardiac Individual Treatment Plan  Patient Details  Name: Carolyn Rowland MRN: [id number] Date of Birth: [birth year] Referring Provider:   Flowsheet Row INTENSIVE CARDIAC REHAB ORIENT from 01/31/2024 in [facility]  Referring Provider Ozell Fell, MD    Initial Encounter Date:  Flowsheet Row INTENSIVE CARDIAC REHAB ORIENT from 01/31/2024 in [facility]  Date 01/31/24    Visit Diagnosis: 10/11/23 S/P TAVR (transcatheter aortic valve replacement)  Patient's Home Medications on Admission:  Current Outpatient Medications:    amLODipine  (NORVASC ) 2.5 MG tablet, Take 2.5 mg by mouth daily., Disp: , Rfl:    aspirin  81 MG EC tablet, Take 81 mg by mouth daily. , Disp: , Rfl:    azithromycin  (ZITHROMAX ) 500 MG tablet, Take 1 tablet (500 mg total) by mouth as directed. Take one tablet 1 hour before any dental work including cleanings., Disp: 6 tablet, Rfl: 12   cholecalciferol  (VITAMIN D3) 25 MCG (1000 UNIT) tablet, Take 1,000 Units by mouth daily., Disp: , Rfl:    isosorbide  mononitrate (IMDUR ) 60 MG 24 hr tablet, Take 1 tablet (60 mg total) by mouth daily., Disp: 30 tablet, Rfl: 1   LINZESS 72 MCG capsule, Take 72 mcg by mouth daily as needed (constipation)., Disp: , Rfl:    metoprolol  succinate (TOPROL -XL) 50 MG 24 hr tablet, Take 50 mg by mouth daily. Take with or immediately following a meal., Disp: , Rfl:    Multiple Minerals-Vitamins (CALCIUM  & VIT D3 BONE HEALTH PO), Take 1 tablet by mouth daily., Disp: , Rfl:    nitroGLYCERIN  (NITROSTAT ) 0.4 MG SL tablet, Place 1 tablet (0.4 mg total) under the tongue every 5 (five) minutes as needed for chest pain., Disp: 30 tablet, Rfl: 1   omeprazole (PRILOSEC) 40 MG capsule, Take 40 mg by mouth daily as needed (acid reflux). , Disp: , Rfl: 1   rosuvastatin  (CRESTOR ) 20 MG tablet, Take 20 mg by mouth daily., Disp: , Rfl:    spironolactone   (ALDACTONE ) 25 MG tablet, Take 12.5 mg by mouth daily. (Patient taking differently: Take 12.5 mg by mouth daily.), Disp: , Rfl:   Past Medical History: Past Medical History:  Diagnosis Date   Anemia    in younger years   Arthritis    hands; legs (08/17/2013)   Coronary artery disease    6 stents in 2015   GERD (gastroesophageal reflux disease)    H/O hiatal hernia    High cholesterol    Hx of adenomatous colonic polyps 08/15/2018   Hypertension    Radiation 11/28/14-01/02/15   Right breast 50 Gray   S/P TAVR (transcatheter aortic valve replacement)    Severe aortic stenosis    Vertigo     Tobacco Use: Social History   Tobacco Use  Smoking Status Former   Current packs/day: 0.00   Average packs/day: 1 pack/day for 52.0 years (52.0 ttl pk-yrs)   Types: Cigarettes   Start date: 04/04/1962   Quit date: 04/04/2014   Years since quitting: 9.8  Smokeless Tobacco Never    Labs: Review Flowsheet       Latest Ref Rng & Units 08/07/2013 12/29/2015 08/30/2023 10/11/2023  Labs for ITP Cardiac and Pulmonary Rehab  Cholestrol 0 - 200 mg/dL - 832  - -  LDL (calc) 0 - 99 mg/dL - 898  - -  HDL-C >59 mg/dL - 50  - -  Trlycerides <150 mg/dL - 81  - -  PH, Arterial 7.35 - 7.45 7.364  - 7.366  -  PCO2 arterial 32 - 48 mmHg 46.5  - 43.1  -  Bicarbonate 20.0 - 28.0 mmol/L 26.5  27.6  - 19.7  26.6  24.7  -  TCO2 22 - 32 mmol/L 28  29  - 21  28  26  23    Acid-base deficit 0.0 - 2.0 mmol/L - - 8.0  1.0  -  O2 Saturation % 94.0  67.0  - 66  76  93  -    Details       Multiple values from one day are sorted in reverse-chronological order         Capillary Blood Glucose: Lab Results  Component Value Date   GLUCAP 92 02/10/2024     Exercise Target Goals: Exercise Program Goal: Individual exercise prescription set using results from initial 6 min walk test and THRR while considering  patient's activity barriers and safety.   Exercise Prescription Goal: Initial exercise prescription  builds to 30-45 minutes a day of aerobic activity, 2-3 days per week.  Home exercise guidelines will be given to patient during program as part of exercise prescription that the participant will acknowledge.  Activity Barriers & Risk Stratification:  Activity Barriers & Cardiac Risk Stratification - 01/31/24 1000       Activity Barriers & Cardiac Risk Stratification   Activity Barriers Balance Concerns;Arthritis;Joint Problems;History of Falls;Deconditioning;Muscular Weakness;Shortness of Breath;Left Hip Replacement;Right Hip Replacement    Cardiac Risk Stratification High          6 Minute Walk:  6 Minute Walk     Row Name 01/31/24 0959         6 Minute Walk   Phase Initial     Distance 480 feet     Walk Time 6 minutes     # of Rest Breaks 1  1:54 to 3:56     MPH 1     METS 0.5     RPE 11     Perceived Dyspnea  1     VO2 Peak 1.5     Symptoms Yes (comment)     Comments SOB, RPD =1, Posterior thigh pain 4/10, both better with rest     Resting HR 67 bpm     Resting BP 126/86     Resting Oxygen  Saturation  98 %     Exercise Oxygen  Saturation  during 6 min walk 97 %     Max Ex. HR 89 bpm     Max Ex. BP 136/81     2 Minute Post BP 124/78        Oxygen  Initial Assessment:   Oxygen  Re-Evaluation:   Oxygen  Discharge (Final Oxygen  Re-Evaluation):   Initial Exercise Prescription:  Initial Exercise Prescription - 01/31/24 1000       Date of Initial Exercise RX and Referring Provider   Date 01/31/24    Referring Provider Ozell Fell, MD    Expected Discharge Date 04/18/24      NuStep   Level 3    SPM 70    Minutes 25    METs 1      Prescription Details   Frequency (times per week) 3    Duration Progress to 30 minutes of continuous aerobic without signs/symptoms of physical distress      Intensity   THRR 40-80% of Max Heartrate 56-112    Ratings of Perceived Exertion 11-13    Perceived Dyspnea 0-4  Progression   Progression Continue progressive  overload as per policy without signs/symptoms or physical distress.      Resistance Training   Training Prescription Yes    Weight 2 lbs    Reps 10-15          Perform Capillary Blood Glucose checks as needed.  Exercise Prescription Changes:   Exercise Prescription Changes     Row Name 02/06/24 1647             Response to Exercise   Blood Pressure (Admit) 98/60       Blood Pressure (Exercise) 140/82       Blood Pressure (Exit) 142/82       Heart Rate (Admit) 69 bpm       Heart Rate (Exercise) 88 bpm       Heart Rate (Exit) 68 bpm       Rating of Perceived Exertion (Exercise) 9       Perceived Dyspnea (Exercise) 0       Symptoms Fatigue, no weights today.       Comments Pt first day in the [facility] ICR program       Duration Progress to 30 minutes of  aerobic without signs/symptoms of physical distress       Intensity THRR unchanged         Progression   Progression Continue to progress workloads to maintain intensity without signs/symptoms of physical distress.       Average METs 1.7         Resistance Training   Training Prescription No       Weight 2 lbs       Reps 10-15       Time 10 Minutes         NuStep   Level 1       SPM 65       Minutes 24       METs 1.7          Exercise Comments:   Exercise Comments     Row Name 02/06/24 1652           Exercise Comments Pt first day in the [facility] ICR program. Pt tolerated exercise fair, she was very fatigued after 25 mins on the Nustep, No weights today. Will work on endurance over time. Average MET's today 1.7.          Exercise Goals and Review:   Exercise Goals     Row Name 01/31/24 0808             Exercise Goals   Increase Physical Activity Yes       Intervention Provide advice, education, support and counseling about physical activity/exercise needs.;Develop an individualized exercise prescription for aerobic and resistive training based on initial evaluation findings, risk  stratification, comorbidities and participant's personal goals.       Expected Outcomes Long Term: Add in home exercise to make exercise part of routine and to increase amount of physical activity.;Long Term: Exercising regularly at least 3-5 days a week.;Short Term: Attend rehab on a regular basis to increase amount of physical activity.       Increase Strength and Stamina Yes       Intervention Provide advice, education, support and counseling about physical activity/exercise needs.;Develop an individualized exercise prescription for aerobic and resistive training based on initial evaluation findings, risk stratification, comorbidities and participant's personal goals.       Expected Outcomes Short Term: Increase workloads from initial exercise  prescription for resistance, speed, and METs.;Short Term: Perform resistance training exercises routinely during rehab and add in resistance training at home;Long Term: Improve cardiorespiratory fitness, muscular endurance and strength as measured by increased METs and functional capacity ( )       Able to understand and use rate of perceived exertion (RPE) scale Yes       Intervention Provide education and explanation on how to use RPE scale       Expected Outcomes Short Term: Able to use RPE daily in rehab to express subjective intensity level;Long Term:  Able to use RPE to guide intensity level when exercising independently       Knowledge and understanding of Target Heart Rate Range (THRR) Yes       Intervention Provide education and explanation of THRR including how the numbers were predicted and where they are located for reference       Expected Outcomes Short Term: Able to state/look up THRR;Long Term: Able to use THRR to govern intensity when exercising independently;Short Term: Able to use daily as guideline for intensity in rehab       Understanding of Exercise Prescription Yes       Intervention Provide education, explanation, and written  materials on patient's individual exercise prescription       Expected Outcomes Short Term: Able to explain program exercise prescription;Long Term: Able to explain home exercise prescription to exercise independently          Exercise Goals Re-Evaluation :  Exercise Goals Re-Evaluation     Row Name 02/06/24 1649             Exercise Goal Re-Evaluation   Exercise Goals Review Increase Physical Activity;Increase Strength and Stamina;Able to understand and use rate of perceived exertion (RPE) scale;Knowledge and understanding of Target Heart Rate Range (THRR);Understanding of Exercise Prescription       Comments Pt first day in the [facility] ICR program. Pt tolerated exercise fair, she was very fatigued after 25 mins on the Nustep, No weights today. Will work on endurance over time. Average MET's today 1.7.       Expected Outcomes Will continue to monitor and progress workloads gradually without symptoms          Discharge Exercise Prescription (Final Exercise Prescription Changes):  Exercise Prescription Changes - 02/06/24 1647       Response to Exercise   Blood Pressure (Admit) 98/60    Blood Pressure (Exercise) 140/82    Blood Pressure (Exit) 142/82    Heart Rate (Admit) 69 bpm    Heart Rate (Exercise) 88 bpm    Heart Rate (Exit) 68 bpm    Rating of Perceived Exertion (Exercise) 9    Perceived Dyspnea (Exercise) 0    Symptoms Fatigue, no weights today.    Comments Pt first day in the [facility] ICR program    Duration Progress to 30 minutes of  aerobic without signs/symptoms of physical distress    Intensity THRR unchanged      Progression   Progression Continue to progress workloads to maintain intensity without signs/symptoms of physical distress.    Average METs 1.7      Resistance Training   Training Prescription No    Weight 2 lbs    Reps 10-15    Time 10 Minutes      NuStep   Level 1    SPM 65    Minutes 24    METs 1.7          Nutrition:  Target Goals:  Understanding of nutrition guidelines, daily intake of sodium 1500mg , cholesterol 200mg , calories 30% from fat and 7% or less from saturated fats, daily to have 5 or more servings of fruits and vegetables.  Biometrics:  Pre Biometrics - 01/31/24 0900       Pre Biometrics   Waist Circumference 42 inches    Hip Circumference 49.5 inches    Waist to Hip Ratio 0.85 %    Triceps Skinfold 33 mm    % Body Fat 46.8 %    Grip Strength 14 kg    Flexibility --   Not performed   Single Leg Stand 2.12 seconds           Nutrition Therapy Plan and Nutrition Goals:  Nutrition Therapy & Goals - 02/10/24 1109       Nutrition Therapy   Diet Heart Healthy Diet    Drug/Food Interactions Statins/Certain Fruits      Personal Nutrition Goals   Nutrition Goal Patient to identify strategies for reducing cardiovascular risk by attending the [facility] education and nutrition series weekly.    Personal Goal #2 Patient to improve diet quality by using the plate method as a guide for meal planning to include lean protein/plant protein, fruits, vegetables, whole grains, nonfat dairy as part of a well-balanced diet.    Comments Channa has medical history of hx of right breast cancer (s/p lumpectomy, radiation, and antiestrogen therapy), CAD s/p previous PCI to RCA and LCx (2015), HTN, HLD, CKD stage IIIa, morbid obesity (BMI 38), RBBB, and severe AS s/p TAVR (10/11/23). No updated lipid panel available for review. Will continue to discuss strategies for weight loss including label reading, calorie density, the plate method as ag guide for meal planning ,etc. Patient will benefit from participation in intensive cardiac rehab for nutrition education, exercise, and lifestyle modification.      Intervention Plan   Intervention Prescribe, educate and counsel regarding individualized specific dietary modifications aiming towards targeted core components such as weight, hypertension, lipid management, diabetes, heart  failure and other comorbidities.;Nutrition handout(s) given to patient.    Expected Outcomes Short Term Goal: Understand basic principles of dietary content, such as calories, fat, sodium, cholesterol and nutrients.;Long Term Goal: Adherence to prescribed nutrition plan.          Nutrition Assessments:  MEDIFICTS Score Key: >=70 Need to make dietary changes  40-70 Heart Healthy Diet <= 40 Therapeutic Level Cholesterol Diet    Picture Your Plate Scores: <59 Unhealthy dietary pattern with much room for improvement. 41-50 Dietary pattern unlikely to meet recommendations for good health and room for improvement. 51-60 More healthful dietary pattern, with some room for improvement.  >60 Healthy dietary pattern, although there may be some specific behaviors that could be improved.    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 No updated lipid panel available for review. Will continue to discuss strategies for weight loss including label reading, calorie density, the plate method as ag guide for meal planning ,etc. Patient will benefit from participation in intensive cardiac rehab for  nutrition education, exercise, and lifestyle modification.          Nutrition Goals Re-Evaluation:  Nutrition Goals Re-Evaluation     Row Name 02/10/24 1109             Goals   Current Weight 204 lb 12.9 oz (92.9 kg)       Expected Outcome Shawnee has medical history of hx of right breast cancer (s/p lumpectomy, radiation, and antiestrogen therapy), CAD s/p previous PCI to RCA and LCx (2015), HTN, HLD, CKD stage IIIa,obesity, RBBB, and severe AS  s/p TAVR (10/11/23). No updated lipid panel available for review. Will continue to discuss strategies for weight loss including label reading, calorie density, the plate method as ag guide for meal planning ,etc. Patient will benefit from participation in intensive cardiac rehab for nutrition education, exercise, and lifestyle modification.          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 No updated lipid panel available for review. Will continue to discuss strategies for weight loss including label reading, calorie density, the plate method as ag guide for meal planning ,etc. Patient will benefit from participation in intensive cardiac rehab for nutrition education, exercise, and lifestyle modification.          Psychosocial: Target Goals: Acknowledge presence or absence of significant depression and/or stress, maximize coping skills, provide positive support system. Participant is able to verbalize types and ability to use techniques and skills needed for reducing stress and depression.  Initial Review & Psychosocial Screening:  Initial Psych Review & Screening - 01/31/24 1251       Initial Review   Current issues with Current Anxiety/Panic;Current Depression      Family Dynamics   Good Support System? Yes   Pt has daughter for support system     Barriers   Psychosocial barriers to participate in program The patient should benefit from training in stress management and relaxation.      Screening Interventions   Interventions Encouraged to exercise;To provide support and resources with identified psychosocial  needs;Provide feedback about the scores to participant    Expected Outcomes Long Term Goal: Stressors or current issues are controlled or eliminated.;Short Term goal: Identification and review with participant of any Quality of Life or Depression concerns found by scoring the questionnaire.;Long Term goal: The participant improves quality of Life and PHQ9 Scores as seen by post scores and/or verbalization of changes          Quality of Life Scores:  Quality of Life - 01/31/24 1104       Quality of Life   Select Quality of Life      Quality of Life Scores   Health/Function Pre 15.57 %    Socioeconomic Pre 21.17 %    Psych/Spiritual Pre 26 %    Family Pre 20 %    GLOBAL Pre 19.57 %         Scores of 19 and below usually indicate a poorer quality of life in these areas.  A difference of  2-3 points is a clinically meaningful difference.  A difference of 2-3 points in  the total score of the Quality of Life Index has been associated with significant improvement in overall quality of life, self-image, physical symptoms, and general health in studies assessing change in quality of life.  PHQ-9: Review Flowsheet  More data may exist      01/31/2024 01/23/2020 12/10/2014 03/08/2014 11/05/2013  Depression screen PHQ 2/9  Decreased Interest 3 0 0 0 0  Down, Depressed, Hopeless 1 0 0 1 0  PHQ - 2 Score 4 0 0 1 0  Altered sleeping 1 - - - -  Tired, decreased energy 1 - - - -  Change in appetite 1 - - - -  Feeling bad or failure about yourself  0 - - - -  Trouble concentrating 0 - - - -  Moving slowly or fidgety/restless 0 - - - -  Suicidal thoughts 0 - - - -  PHQ-9 Score 7 - - - -  Difficult doing work/chores Not difficult at all - - - -   Interpretation of Total Score  Total Score Depression Severity:  1-4 = Minimal depression, 5-9 = Mild depression, 10-14 = Moderate depression, 15-19 = Moderately severe depression, 20-27 = Severe depression   Psychosocial Evaluation and  Intervention:   Psychosocial Re-Evaluation:  Psychosocial Re-Evaluation     Row Name 02/08/24 1425 02/13/24 0830           Psychosocial Re-Evaluation   Current issues with Current Anxiety/Panic;Current Depression Current Anxiety/Panic;Current Depression      Comments Laquitta did not voice any increased concerns or stressors on her first day of exercise will review PHQ9 in the upcoming week Exercise is currently on hold.      Interventions Encouraged to attend Cardiac Rehabilitation for the exercise;Stress management education;Relaxation education Encouraged to attend Cardiac Rehabilitation for the exercise;Stress management education;Relaxation education      Continue Psychosocial Services  Follow up required by staff Follow up required by staff         Psychosocial Discharge (Final Psychosocial Re-Evaluation):  Psychosocial Re-Evaluation - 02/13/24 0830       Psychosocial Re-Evaluation   Current issues with Current Anxiety/Panic;Current Depression    Comments Exercise is currently on hold.    Interventions Encouraged to attend Cardiac Rehabilitation for the exercise;Stress management education;Relaxation education    Continue Psychosocial Services  Follow up required by staff          Vocational Rehabilitation: Provide vocational rehab assistance to qualifying candidates.   Vocational Rehab Evaluation & Intervention:  Vocational Rehab - 01/31/24 0809       Initial Vocational Rehab Evaluation & Intervention   Assessment shows need for Vocational Rehabilitation No   Pt is retired         Education: Education Goals: Education classes will be provided on a weekly basis, covering required topics. Participant will state understanding/return demonstration of topics presented.    Education     Row Name 02/06/24 1500     Education   Cardiac Education Topics [facility]   Glass blower/designer Nutrition   Nutrition Workshop Label  Reading   Instruction Review Code 1- Verbalizes Understanding   Class Start Time 1400   Class Stop Time 1450   Class Time Calculation (min) 50 min    Row Name 02/10/24 1500     Education   Cardiac Education Topics [facility]   Licensed conveyancer  Nutrition   Nutrition Other   Instruction Review Code 1- Verbalizes Understanding   Class Start Time 1400   Class Stop Time 1445   Class Time Calculation (min) 45 min      Core Videos: Exercise    Move It!  Clinical staff conducted group or individual video education with verbal and written material and guidebook.  Patient learns the recommended [facility]ritikin exercise program. Exercise with the goal of living a long, healthy life. Some of the health benefits of exercise include controlled diabetes, healthier blood pressure levels, improved cholesterol levels, improved heart and lung capacity, improved sleep, and better body composition. Everyone should speak with their doctor before starting or changing an exercise routine.  Biomechanical Limitations Clinical staff conducted group or individual video education with verbal and written material and guidebook.  Patient learns how biomechanical limitations can impact exercise and how we can mitigate and possibly overcome limitations to have an impactful and balanced exercise routine.  Body Composition Clinical staff conducted group or individual video education with verbal and written material and guidebook.  Patient learns that body composition (ratio of muscle mass to fat mass) is a key component to assessing overall fitness, rather than body weight alone. Increased fat mass, especially visceral belly fat, can put us  at increased risk for metabolic syndrome, type 2 diabetes, heart disease, and even death. It is recommended to combine diet and exercise (cardiovascular and resistance training) to improve your body composition. Seek guidance from your  physician and exercise physiologist before implementing an exercise routine.  Exercise Action Plan Clinical staff conducted group or individual video education with verbal and written material and guidebook.  Patient learns the recommended strategies to achieve and enjoy long-term exercise adherence, including variety, self-motivation, self-efficacy, and positive decision making. Benefits of exercise include fitness, good health, weight management, more energy, better sleep, less stress, and overall well-being.  Medical   Heart Disease Risk Reduction Clinical staff conducted group or individual video education with verbal and written material and guidebook.  Patient learns our heart is our most vital organ as it circulates oxygen , nutrients, white blood cells, and hormones throughout the entire body, and carries waste away. Data supports a plant-based eating plan like the Pritikin Program for its effectiveness in slowing progression of and reversing heart disease. The video provides a number of recommendations to address heart disease.   Metabolic Syndrome and Belly Fat  Clinical staff conducted group or individual video education with verbal and written material and guidebook.  Patient learns what metabolic syndrome is, how it leads to heart disease, and how one can reverse it and keep it from coming back. You have metabolic syndrome if you have 3 of the following 5 criteria: abdominal obesity, high blood pressure, high triglycerides, low HDL cholesterol, and high blood sugar.  Hypertension and Heart Disease Clinical staff conducted group or individual video education with verbal and written material and guidebook.  Patient learns that high blood pressure, or hypertension, is very common in the [place] . Hypertension is largely due to excessive salt intake, but other important risk factors include being overweight, physical inactivity, drinking too much alcohol , smoking, and not eating enough  potassium from fruits and vegetables. High blood pressure is a leading risk factor for heart attack, stroke, congestive heart failure, dementia, kidney failure, and premature death. Long-term effects of excessive salt intake include stiffening of the arteries and thickening of heart muscle and organ damage. Recommendations include ways to reduce hypertension and the risk of heart  disease.  Diseases of Our Time - Focusing on Diabetes Clinical staff conducted group or individual video education with verbal and written material and guidebook.  Patient learns why the best way to stop diseases of our time is prevention, through food and other lifestyle changes. Medicine (such as prescription pills and surgeries) is often only a Band-Aid on the problem, not a long-term solution. Most common diseases of our time include obesity, type 2 diabetes, hypertension, heart disease, and cancer. The Pritikin Program is recommended and has been proven to help reduce, reverse, and/or prevent the damaging effects of metabolic syndrome.  Nutrition   Overview of the Pritikin Eating Plan  Clinical staff conducted group or individual video education with verbal and written material and guidebook.  Patient learns about the Pritikin Eating Plan for disease risk reduction. The Pritikin Eating Plan emphasizes a wide variety of unrefined, minimally-processed carbohydrates, like fruits, vegetables, whole grains, and legumes. Go, Caution, and Stop food choices are explained. Plant-based and lean animal proteins are emphasized. Rationale provided for low sodium intake for blood pressure control, low added sugars for blood sugar stabilization, and low added fats and oils for coronary artery disease risk reduction and weight management.  Calorie Density  Clinical staff conducted group or individual video education with verbal and written material and guidebook.  Patient learns about calorie density and how it impacts the Pritikin Eating  Plan. Knowing the characteristics of the food you choose will help you decide whether those foods will lead to weight gain or weight loss, and whether you want to consume more or less of them. Weight loss is usually a side effect of the Pritikin Eating Plan because of its focus on low calorie-dense foods.  Label Reading  Clinical staff conducted group or individual video education with verbal and written material and guidebook.  Patient learns about the Pritikin recommended label reading guidelines and corresponding recommendations regarding calorie density, added sugars, sodium content, and whole grains.  Dining Out - Part 1  Clinical staff conducted group or individual video education with verbal and written material and guidebook.  Patient learns that restaurant meals can be sabotaging because they can be so high in calories, fat, sodium, and/or sugar. Patient learns recommended strategies on how to positively address this and avoid unhealthy pitfalls.  Facts on Fats  Clinical staff conducted group or individual video education with verbal and written material and guidebook.  Patient learns that lifestyle modifications can be just as effective, if not more so, as many medications for lowering your risk of heart disease. A Pritikin lifestyle can help to reduce your risk of inflammation and atherosclerosis (cholesterol build-up, or plaque, in the artery walls). Lifestyle interventions such as dietary choices and physical activity address the cause of atherosclerosis. A review of the types of fats and their impact on blood cholesterol levels, along with dietary recommendations to reduce fat intake is also included.  Nutrition Action Plan  Clinical staff conducted group or individual video education with verbal and written material and guidebook.  Patient learns how to incorporate Pritikin recommendations into their lifestyle. Recommendations include planning and keeping personal health goals in mind  as an important part of their success.  Healthy Mind-Set    Healthy Minds, Bodies, Hearts  Clinical staff conducted group or individual video education with verbal and written material and guidebook.  Patient learns how to identify when they are stressed. Video will discuss the impact of that stress, as well as the many benefits of stress management.  Patient will also be introduced to stress management techniques. The way we think, act, and feel has an impact on our hearts.  How Our Thoughts Can Heal Our Hearts  Clinical staff conducted group or individual video education with verbal and written material and guidebook.  Patient learns that negative thoughts can cause depression and anxiety. This can result in negative lifestyle behavior and serious health problems. Cognitive behavioral therapy is an effective method to help control our thoughts in order to change and improve our emotional outlook.  Additional Videos:  Exercise    Improving Performance  Clinical staff conducted group or individual video education with verbal and written material and guidebook.  Patient learns to use a non-linear approach by alternating intensity levels and lengths of time spent exercising to help burn more calories and lose more body fat. Cardiovascular exercise helps improve heart health, metabolism, hormonal balance, blood sugar control, and recovery from fatigue. Resistance training improves strength, endurance, balance, coordination, reaction time, metabolism, and muscle mass. Flexibility exercise improves circulation, posture, and balance. Seek guidance from your physician and exercise physiologist before implementing an exercise routine and learn your capabilities and proper form for all exercise.  Introduction to Yoga  Clinical staff conducted group or individual video education with verbal and written material and guidebook.  Patient learns about yoga, a discipline of the coming together of mind, breath,  and body. The benefits of yoga include improved flexibility, improved range of motion, better posture and core strength, increased lung function, weight loss, and positive self-image. Yoga's heart health benefits include lowered blood pressure, healthier heart rate, decreased cholesterol and triglyceride levels, improved immune function, and reduced stress. Seek guidance from your physician and exercise physiologist before implementing an exercise routine and learn your capabilities and proper form for all exercise.  Medical   Aging: Enhancing Your Quality of Life  Clinical staff conducted group or individual video education with verbal and written material and guidebook.  Patient learns key strategies and recommendations to stay in good physical health and enhance quality of life, such as prevention strategies, having an advocate, securing a Health Care Proxy and Power of Attorney, and keeping a list of medications and system for tracking them. It also discusses how to avoid risk for bone loss.  Biology of Weight Control  Clinical staff conducted group or individual video education with verbal and written material and guidebook.  Patient learns that weight gain occurs because we consume more calories than we burn (eating more, moving less). Even if your body weight is normal, you may have higher ratios of fat compared to muscle mass. Too much body fat puts you at increased risk for cardiovascular disease, heart attack, stroke, type 2 diabetes, and obesity-related cancers. In addition to exercise, following the Pritikin Eating Plan can help reduce your risk.  Decoding Lab Results  Clinical staff conducted group or individual video education with verbal and written material and guidebook.  Patient learns that lab test reflects one measurement whose values change over time and are influenced by many factors, including medication, stress, sleep, exercise, food, hydration, pre-existing medical conditions,  and more. It is recommended to use the knowledge from this video to become more involved with your lab results and evaluate your numbers to speak with your doctor.   Diseases of Our Time - Overview  Clinical staff conducted group or individual video education with verbal and written material and guidebook.  Patient learns that according to the CDC, 50% to 70% of chronic diseases (  such as obesity, type 2 diabetes, elevated lipids, hypertension, and heart disease) are avoidable through lifestyle improvements including healthier food choices, listening to satiety cues, and increased physical activity.  Sleep Disorders Clinical staff conducted group or individual video education with verbal and written material and guidebook.  Patient learns how good quality and duration of sleep are important to overall health and well-being. Patient also learns about sleep disorders and how they impact health along with recommendations to address them, including discussing with a physician.  Nutrition  Dining Out - Part 2 Clinical staff conducted group or individual video education with verbal and written material and guidebook.  Patient learns how to plan ahead and communicate in order to maximize their dining experience in a healthy and nutritious manner. Included are recommended food choices based on the type of restaurant the patient is visiting.   Fueling a Banker conducted group or individual video education with verbal and written material and guidebook.  There is a strong connection between our food choices and our health. Diseases like obesity and type 2 diabetes are very prevalent and are in large-part due to lifestyle choices. The Pritikin Eating Plan provides plenty of food and hunger-curbing satisfaction. It is easy to follow, affordable, and helps reduce health risks.  Menu Workshop  Clinical staff conducted group or individual video education with verbal and written material  and guidebook.  Patient learns that restaurant meals can sabotage health goals because they are often packed with calories, fat, sodium, and sugar. Recommendations include strategies to plan ahead and to communicate with the manager, chef, or server to help order a healthier meal.  Planning Your Eating Strategy  Clinical staff conducted group or individual video education with verbal and written material and guidebook.  Patient learns about the Pritikin Eating Plan and its benefit of reducing the risk of disease. The Pritikin Eating Plan does not focus on calories. Instead, it emphasizes high-quality, nutrient-rich foods. By knowing the characteristics of the foods, we choose, we can determine their calorie density and make informed decisions.  Targeting Your Nutrition Priorities  Clinical staff conducted group or individual video education with verbal and written material and guidebook.  Patient learns that lifestyle habits have a tremendous impact on disease risk and progression. This video provides eating and physical activity recommendations based on your personal health goals, such as reducing LDL cholesterol, losing weight, preventing or controlling type 2 diabetes, and reducing high blood pressure.  Vitamins and Minerals  Clinical staff conducted group or individual video education with verbal and written material and guidebook.  Patient learns different ways to obtain key vitamins and minerals, including through a recommended healthy diet. It is important to discuss all supplements you take with your doctor.   Healthy Mind-Set    Smoking Cessation  Clinical staff conducted group or individual video education with verbal and written material and guidebook.  Patient learns that cigarette smoking and tobacco addiction pose a serious health risk which affects millions of people. Stopping smoking will significantly reduce the risk of heart disease, lung disease, and many forms of cancer.  Recommended strategies for quitting are covered, including working with your doctor to develop a successful plan.  Culinary   Becoming a Set designer conducted group or individual video education with verbal and written material and guidebook.  Patient learns that cooking at home can be healthy, cost-effective, quick, and puts them in control. Keys to cooking healthy recipes will include looking at  your recipe, assessing your equipment needs, planning ahead, making it simple, choosing cost-effective seasonal ingredients, and limiting the use of added fats, salts, and sugars.  Cooking - Breakfast and Snacks  Clinical staff conducted group or individual video education with verbal and written material and guidebook.  Patient learns how important breakfast is to satiety and nutrition through the entire day. Recommendations include key foods to eat during breakfast to help stabilize blood sugar levels and to prevent overeating at meals later in the day. Planning ahead is also a key component.  Cooking - Educational psychologist conducted group or individual video education with verbal and written material and guidebook.  Patient learns eating strategies to improve overall health, including an approach to cook more at home. Recommendations include thinking of animal protein as a side on your plate rather than center stage and focusing instead on lower calorie dense options like vegetables, fruits, whole grains, and plant-based proteins, such as beans. Making sauces in large quantities to freeze for later and leaving the skin on your vegetables are also recommended to maximize your experience.  Cooking - Healthy Salads and Dressing Clinical staff conducted group or individual video education with verbal and written material and guidebook.  Patient learns that vegetables, fruits, whole grains, and legumes are the foundations of the Pritikin Eating Plan. Recommendations include how  to incorporate each of these in flavorful and healthy salads, and how to create homemade salad dressings. Proper handling of ingredients is also covered. Cooking - Soups and [employer] - Soups and Desserts Clinical staff conducted group or individual video education with verbal and written material and guidebook.  Patient learns that Pritikin soups and desserts make for easy, nutritious, and delicious snacks and meal components that are low in sodium, fat, sugar, and calorie density, while high in vitamins, minerals, and filling fiber. Recommendations include simple and healthy ideas for soups and desserts.   Overview     The Pritikin Solution Program Overview Clinical staff conducted group or individual video education with verbal and written material and guidebook.  Patient learns that the results of the Pritikin Program have been documented in more than 100 articles published in peer-reviewed journals, and the benefits include reducing risk factors for (and, in some cases, even reversing) high cholesterol, high blood pressure, type 2 diabetes, obesity, and more! An overview of the three key pillars of the Pritikin Program will be covered: eating well, doing regular exercise, and having a healthy mind-set.  WORKSHOPS  Exercise: Exercise Basics: Building Your Action Plan Clinical staff led group instruction and group discussion with PowerPoint presentation and patient guidebook. To enhance the learning environment the use of posters, models and videos may be added. At the conclusion of this workshop, patients will comprehend the difference between physical activity and exercise, as well as the benefits of incorporating both, into their routine. Patients will understand the FITT (Frequency, Intensity, Time, and Type) principle and how to use it to build an exercise action plan. In addition, safety concerns and other considerations for exercise and cardiac rehab will be addressed by the  presenter. The purpose of this lesson is to promote a comprehensive and effective weekly exercise routine in order to improve patients' overall level of fitness.   Managing Heart Disease: Your Path to a Healthier Heart Clinical staff led group instruction and group discussion with PowerPoint presentation and patient guidebook. To enhance the learning environment the use of posters, models and videos may be added.At the  conclusion of this workshop, patients will understand the anatomy and physiology of the heart. Additionally, they will understand how Pritikin's three pillars impact the risk factors, the progression, and the management of heart disease.  The purpose of this lesson is to provide a high-level overview of the heart, heart disease, and how the Pritikin lifestyle positively impacts risk factors.  Exercise Biomechanics Clinical staff led group instruction and group discussion with PowerPoint presentation and patient guidebook. To enhance the learning environment the use of posters, models and videos may be added. Patients will learn how the structural parts of their bodies function and how these functions impact their daily activities, movement, and exercise. Patients will learn how to promote a neutral spine, learn how to manage pain, and identify ways to improve their physical movement in order to promote healthy living. The purpose of this lesson is to expose patients to common physical limitations that impact physical activity. Participants will learn practical ways to adapt and manage aches and pains, and to minimize their effect on regular exercise. Patients will learn how to maintain good posture while sitting, walking, and lifting.  Balance Training and Fall Prevention  Clinical staff led group instruction and group discussion with PowerPoint presentation and patient guidebook. To enhance the learning environment the use of posters, models and videos may be added. At the  conclusion of this workshop, patients will understand the importance of their sensorimotor skills (vision, proprioception, and the vestibular system) in maintaining their ability to balance as they age. Patients will apply a variety of balancing exercises that are appropriate for their current level of function. Patients will understand the common causes for poor balance, possible solutions to these problems, and ways to modify their physical environment in order to minimize their fall risk. The purpose of this lesson is to teach patients about the importance of maintaining balance as they age and ways to minimize their risk of falling.  WORKSHOPS   Nutrition:  Fueling a Ship broker led group instruction and group discussion with PowerPoint presentation and patient guidebook. To enhance the learning environment the use of posters, models and videos may be added. Patients will review the foundational principles of the Pritikin Eating Plan and understand what constitutes a serving size in each of the food groups. Patients will also learn Pritikin-friendly foods that are better choices when away from home and review make-ahead meal and snack options. Calorie density will be reviewed and applied to three nutrition priorities: weight maintenance, weight loss, and weight gain. The purpose of this lesson is to reinforce (in a group setting) the key concepts around what patients are recommended to eat and how to apply these guidelines when away from home by planning and selecting Pritikin-friendly options. Patients will understand how calorie density may be adjusted for different weight management goals.  Mindful Eating  Clinical staff led group instruction and group discussion with PowerPoint presentation and patient guidebook. To enhance the learning environment the use of posters, models and videos may be added. Patients will briefly review the concepts of the Pritikin Eating Plan and the  importance of low-calorie dense foods. The concept of mindful eating will be introduced as well as the importance of paying attention to internal hunger signals. Triggers for non-hunger eating and techniques for dealing with triggers will be explored. The purpose of this lesson is to provide patients with the opportunity to review the basic principles of the Pritikin Eating Plan, discuss the value of eating mindfully and how to  measure internal cues of hunger and fullness using the Hunger Scale. Patients will also discuss reasons for non-hunger eating and learn strategies to use for controlling emotional eating.  Targeting Your Nutrition Priorities Clinical staff led group instruction and group discussion with PowerPoint presentation and patient guidebook. To enhance the learning environment the use of posters, models and videos may be added. Patients will learn how to determine their genetic susceptibility to disease by reviewing their family history. Patients will gain insight into the importance of diet as part of an overall healthy lifestyle in mitigating the impact of genetics and other environmental insults. The purpose of this lesson is to provide patients with the opportunity to assess their personal nutrition priorities by looking at their family history, their own health history and current risk factors. Patients will also be able to discuss ways of prioritizing and modifying the Pritikin Eating Plan for their highest risk areas  Menu  Clinical staff led group instruction and group discussion with PowerPoint presentation and patient guidebook. To enhance the learning environment the use of posters, models and videos may be added. Using menus brought in from [employer], or printed from [employer], patients will apply the Pritikin dining out guidelines that were presented in the [employer] video. Patients will also be able to practice these guidelines in a variety of  provided scenarios. The purpose of this lesson is to provide patients with the opportunity to practice hands-on learning of the Pritikin Dining Out guidelines with actual menus and practice scenarios.  Label Reading Clinical staff led group instruction and group discussion with PowerPoint presentation and patient guidebook. To enhance the learning environment the use of posters, models and videos may be added. Patients will review and discuss the Pritikin label reading guidelines presented in Pritikin's Label Reading Educational series video. Using fool labels brought in from local grocery stores and markets, patients will apply the label reading guidelines and determine if the packaged food meet the Pritikin guidelines. The purpose of this lesson is to provide patients with the opportunity to review, discuss, and practice hands-on learning of the Pritikin Label Reading guidelines with actual packaged food labels. Cooking School  Pritikin's [employer] are designed to teach patients ways to prepare quick, simple, and affordable recipes at home. The importance of nutrition's role in chronic disease risk reduction is reflected in its emphasis in the overall Pritikin program. By learning how to prepare essential core Pritikin Eating Plan recipes, patients will increase control over what they eat; be able to customize the flavor of foods without the use of added salt, sugar, or fat; and improve the quality of the food they consume. By learning a set of core recipes which are easily assembled, quickly prepared, and affordable, patients are more likely to prepare more healthy foods at home. These workshops focus on convenient breakfasts, simple entres, side dishes, and desserts which can be prepared with minimal effort and are consistent with nutrition recommendations for cardiovascular risk reduction. Cooking [employer] are taught by a Armed forces logistics/support/administrative officer (RD) who has been trained by the  [employer]. The chef or RD has a clear understanding of the importance of minimizing - if not completely eliminating - added fat, sugar, and sodium in recipes. Throughout the series of Cooking School Workshop sessions, patients will learn about healthy ingredients and efficient methods of cooking to build confidence in their capability to prepare    Cooking School weekly topics:  Adding Flavor- Sodium-Free  Fast and Healthy Breakfasts  Powerhouse Plant-Based Proteins  Satisfying Salads and Dressings  Simple Sides and Sauces  International Cuisine-Spotlight on the Blue Zones  Delicious Desserts  Savory Soups  Efficiency Cooking - Meals in a Snap  Tasty Appetizers and Snacks  Comforting Weekend Breakfasts  One-Pot Wonders   Fast Evening Meals  Landscape architect Your [facility] Plate  WORKSHOPS   Healthy Mindset (Psychosocial):  Focused Goals, Sustainable Changes Clinical staff led group instruction and group discussion with PowerPoint presentation and patient guidebook. To enhance the learning environment the use of posters, models and videos may be added. Patients will be able to apply effective goal setting strategies to establish at least one personal goal, and then take consistent, meaningful action toward that goal. They will learn to identify common barriers to achieving personal goals and develop strategies to overcome them. Patients will also gain an understanding of how our mind-set can impact our ability to achieve goals and the importance of cultivating a positive and growth-oriented mind-set. The purpose of this lesson is to provide patients with a deeper understanding of how to set and achieve personal goals, as well as the tools and strategies needed to overcome common obstacles which may arise along the way.  From Head to Heart: The Power of a Healthy Outlook  Clinical staff led group instruction and group discussion with PowerPoint presentation  and patient guidebook. To enhance the learning environment the use of posters, models and videos may be added. Patients will be able to recognize and describe the impact of emotions and mood on physical health. They will discover the importance of self-care and explore self-care practices which may work for them. Patients will also learn how to utilize the 4 C's to cultivate a healthier outlook and better manage stress and challenges. The purpose of this lesson is to demonstrate to patients how a healthy outlook is an essential part of maintaining good health, especially as they continue their cardiac rehab journey.  Healthy Sleep for a Healthy Heart Clinical staff led group instruction and group discussion with PowerPoint presentation and patient guidebook. To enhance the learning environment the use of posters, models and videos may be added. At the conclusion of this workshop, patients will be able to demonstrate knowledge of the importance of sleep to overall health, well-being, and quality of life. They will understand the symptoms of, and treatments for, common sleep disorders. Patients will also be able to identify daytime and nighttime behaviors which impact sleep, and they will be able to apply these tools to help manage sleep-related challenges. The purpose of this lesson is to provide patients with a general overview of sleep and outline the importance of quality sleep. Patients will learn about a few of the most common sleep disorders. Patients will also be introduced to the concept of "sleep hygiene," and discover ways to self-manage certain sleeping problems through simple daily behavior changes. Finally, the workshop will motivate patients by clarifying the links between quality sleep and their goals of heart-healthy living.   Recognizing and Reducing Stress Clinical staff led group instruction and group discussion with PowerPoint presentation and patient guidebook. To enhance the learning  environment the use of posters, models and videos may be added. At the conclusion of this workshop, patients will be able to understand the types of stress reactions, differentiate between acute and chronic stress, and recognize the impact that chronic stress has on their health. They will also be able to apply different coping mechanisms, such  as reframing negative self-talk. Patients will have the opportunity to practice a variety of stress management techniques, such as deep abdominal breathing, progressive muscle relaxation, and/or guided imagery.  The purpose of this lesson is to educate patients on the role of stress in their lives and to provide healthy techniques for coping with it.  Learning Barriers/Preferences:  Learning Barriers/Preferences - 01/31/24 1008       Learning Barriers/Preferences   Learning Barriers Sight   wears glasses   Learning Preferences Audio;Computer/Internet;Group Instruction;Individual Instruction;Pictoral;Skilled Demonstration;Verbal Instruction;Video;Written Material          Education Topics:  Knowledge Questionnaire Score:  Knowledge Questionnaire Score - 01/31/24 1105       Knowledge Questionnaire Score   Pre Score 18/24          Core Components/Risk Factors/Patient Goals at Admission:  Personal Goals and Risk Factors at Admission - 01/31/24 1006       Core Components/Risk Factors/Patient Goals on Admission    Weight Management Obesity;Weight Loss    Hypertension Yes    Intervention Provide education on lifestyle modifcations including regular physical activity/exercise, weight management, moderate sodium restriction and increased consumption of fresh fruit, vegetables, and low fat dairy, alcohol  moderation, and smoking cessation.;Monitor prescription use compliance.    Expected Outcomes Short Term: Continued assessment and intervention until BP is < 140/41mm HG in hypertensive participants. < 130/67mm HG in hypertensive participants with  diabetes, heart failure or chronic kidney disease.;Long Term: Maintenance of blood pressure at goal levels.    Lipids Yes    Intervention Provide education and support for participant on nutrition & aerobic/resistive exercise along with prescribed medications to achieve LDL 70mg , HDL >40mg .    Expected Outcomes Short Term: Participant states understanding of desired cholesterol values and is compliant with medications prescribed. Participant is following exercise prescription and nutrition guidelines.;Long Term: Cholesterol controlled with medications as prescribed, with individualized exercise RX and with personalized nutrition plan. Value goals: LDL < 70mg , HDL > 40 mg.    Stress Yes    Intervention Offer individual and/or small group education and counseling on adjustment to heart disease, stress management and health-related lifestyle change. Teach and support self-help strategies.;Refer participants experiencing significant psychosocial distress to appropriate mental health specialists for further evaluation and treatment. When possible, include family members and significant others in education/counseling sessions.    Expected Outcomes Short Term: Participant demonstrates changes in health-related behavior, relaxation and other stress management skills, ability to obtain effective social support, and compliance with psychotropic medications if prescribed.;Long Term: Emotional wellbeing is indicated by absence of clinically significant psychosocial distress or social isolation.          Core Components/Risk Factors/Patient Goals Review:   Goals and Risk Factor Review     Row Name 02/08/24 1428 02/13/24 0830           Core Components/Risk Factors/Patient Goals Review   Personal Goals Review Weight Management/Obesity;Lipids;Hypertension;Stress Weight Management/Obesity;Lipids;Hypertension;Stress      Review Delaynee started cardiac rehab on 02/08/24. Reiana did fair with exercise for her  fitness level. Laelynn is somewhat deconditioned. Rashada started cardiac rehab on 02/08/24. Exercise is currently on hold      Expected Outcomes Adaria will continue to participate in cardiac rehab for exercise, nutrition and lifestyle modifications Roxana will continue to participate in cardiac rehab for exercise, nutrition and lifestyle modifications when cleared to return         Core Components/Risk Factors/Patient Goals at Discharge (Final Review):   Goals and Risk Factor Review -  02/13/24 0830       Core Components/Risk Factors/Patient Goals Review   Personal Goals Review Weight Management/Obesity;Lipids;Hypertension;Stress    Review Delisa started cardiac rehab on 02/08/24. Exercise is currently on hold    Expected Outcomes Dannelle will continue to participate in cardiac rehab for exercise, nutrition and lifestyle modifications when cleared to return          ITP Comments:  ITP Comments     Row Name 01/31/24 0807 02/08/24 1423 02/13/24 0828       ITP Comments Wilbert Bihari, MD: Medical Director.  Introduction to the [facility] Program/Intensive Cardiac Rehab.  Initial orientation packet reviewed with the patient. 30 Day ITP Review. Caroll started cardiac rehab on 02/06/24. Danea did fair with exercise for her fitness level. 30 Day ITP Review. Cynitha started cardiac rehab on 02/06/24. Aiva  exercise is currently on hold due to symptoms reported on 02/10/24.        Comments: See ITP comments.[name] RN BSN

## 2024-02-15 ENCOUNTER — Ambulatory Visit (HOSPITAL_COMMUNITY)

## 2024-02-15 ENCOUNTER — Encounter (HOSPITAL_COMMUNITY): Admission: RE | Admit: 2024-02-15 | Source: Ambulatory Visit

## 2024-02-17 ENCOUNTER — Encounter (HOSPITAL_COMMUNITY): Admission: RE | Admit: 2024-02-17 | Source: Ambulatory Visit

## 2024-02-17 ENCOUNTER — Ambulatory Visit (HOSPITAL_COMMUNITY)

## 2024-02-20 ENCOUNTER — Ambulatory Visit (HOSPITAL_COMMUNITY)

## 2024-02-20 ENCOUNTER — Encounter (HOSPITAL_COMMUNITY): Admission: RE | Admit: 2024-02-20 | Source: Ambulatory Visit

## 2024-02-22 ENCOUNTER — Ambulatory Visit (HOSPITAL_COMMUNITY)

## 2024-02-22 ENCOUNTER — Encounter (HOSPITAL_COMMUNITY)

## 2024-02-24 ENCOUNTER — Encounter (HOSPITAL_COMMUNITY): Admission: RE | Admit: 2024-02-24 | Source: Ambulatory Visit

## 2024-02-24 ENCOUNTER — Ambulatory Visit (HOSPITAL_COMMUNITY)

## 2024-02-27 ENCOUNTER — Encounter (HOSPITAL_COMMUNITY): Admission: RE | Admit: 2024-02-27 | Source: Ambulatory Visit

## 2024-02-27 ENCOUNTER — Ambulatory Visit (HOSPITAL_COMMUNITY)

## 2024-02-27 ENCOUNTER — Ambulatory Visit (INDEPENDENT_AMBULATORY_CARE_PROVIDER_SITE_OTHER)

## 2024-02-27 DIAGNOSIS — I442 Atrioventricular block, complete: Secondary | ICD-10-CM

## 2024-02-28 LAB — CUP PACEART REMOTE DEVICE CHECK
Battery Remaining Longevity: 155 mo
Battery Voltage: 3.18 V
Brady Statistic AP VP Percent: 4.52 %
Brady Statistic AP VS Percent: 0 %
Brady Statistic AS VP Percent: 95.46 %
Brady Statistic AS VS Percent: 0.01 %
Brady Statistic RA Percent Paced: 4.51 %
Brady Statistic RV Percent Paced: 99.99 %
Date Time Interrogation Session: 20250824192146
Implantable Lead Connection Status: 753985
Implantable Lead Connection Status: 753985
Implantable Lead Implant Date: 20250409
Implantable Lead Implant Date: 20250409
Implantable Lead Location: 753858
Implantable Lead Location: 753859
Implantable Lead Model: 3830
Implantable Lead Model: 5076
Implantable Pulse Generator Implant Date: 20250409
Lead Channel Impedance Value: 323 Ohm
Lead Channel Impedance Value: 399 Ohm
Lead Channel Impedance Value: 418 Ohm
Lead Channel Impedance Value: 646 Ohm
Lead Channel Pacing Threshold Amplitude: 0.5 V
Lead Channel Pacing Threshold Amplitude: 0.625 V
Lead Channel Pacing Threshold Pulse Width: 0.4 ms
Lead Channel Pacing Threshold Pulse Width: 0.4 ms
Lead Channel Sensing Intrinsic Amplitude: 3.125 mV
Lead Channel Sensing Intrinsic Amplitude: 3.125 mV
Lead Channel Sensing Intrinsic Amplitude: 31.625 mV
Lead Channel Sensing Intrinsic Amplitude: 31.625 mV
Lead Channel Setting Pacing Amplitude: 1.5 V
Lead Channel Setting Pacing Amplitude: 2 V
Lead Channel Setting Pacing Pulse Width: 0.4 ms
Lead Channel Setting Sensing Sensitivity: 1.2 mV
Zone Setting Status: 755011

## 2024-02-29 ENCOUNTER — Ambulatory Visit (HOSPITAL_COMMUNITY)

## 2024-02-29 ENCOUNTER — Encounter (HOSPITAL_COMMUNITY)

## 2024-02-29 ENCOUNTER — Telehealth (HOSPITAL_COMMUNITY): Payer: Self-pay | Admitting: *Deleted

## 2024-02-29 NOTE — Telephone Encounter (Signed)
 Spoke with Sharyne she has an appointment with her primary cardiologist Dr Levern next Wednesday. Will cancel appointments until 03/07/24. Will need clearance to return to group exercise. Patient states understanding.[name] RN BSN

## 2024-03-02 ENCOUNTER — Encounter (HOSPITAL_COMMUNITY)

## 2024-03-02 ENCOUNTER — Ambulatory Visit (HOSPITAL_COMMUNITY)

## 2024-03-07 ENCOUNTER — Encounter (HOSPITAL_COMMUNITY)

## 2024-03-07 ENCOUNTER — Ambulatory Visit (HOSPITAL_COMMUNITY)

## 2024-03-08 ENCOUNTER — Telehealth (HOSPITAL_COMMUNITY): Payer: Self-pay | Admitting: *Deleted

## 2024-03-08 NOTE — Telephone Encounter (Signed)
 Carolyn Rowland has been cleared to return to exercise at cardiac rehab. Obtained office note. Spoke with [employer]. Rosa plans to resume exercise on 03/19/24

## 2024-03-08 NOTE — Addendum Note (Signed)
 Encounter addended by: Vannie Robinson S on: 03/08/2024 10:24 AM  Actions taken: Flowsheet accepted

## 2024-03-08 NOTE — Telephone Encounter (Signed)
 Carolyn Rowland said Dr Levern her primary cardiologist has cleared her to return to cardiac rehab. Will call to verify.Carolyn Elpidio Quan RN BSN

## 2024-03-08 NOTE — Telephone Encounter (Signed)
 Left message to call cardiac rehab.[name] RN BSN

## 2024-03-09 ENCOUNTER — Ambulatory Visit (HOSPITAL_COMMUNITY)

## 2024-03-09 ENCOUNTER — Encounter (HOSPITAL_COMMUNITY)

## 2024-03-11 ENCOUNTER — Ambulatory Visit: Payer: Self-pay | Admitting: Cardiology

## 2024-03-12 ENCOUNTER — Ambulatory Visit (HOSPITAL_COMMUNITY)

## 2024-03-12 ENCOUNTER — Encounter (HOSPITAL_COMMUNITY): Admission: RE | Admit: 2024-03-12 | Source: Ambulatory Visit

## 2024-03-13 ENCOUNTER — Encounter (HOSPITAL_COMMUNITY): Payer: Self-pay | Admitting: *Deleted

## 2024-03-13 DIAGNOSIS — Z952 Presence of prosthetic heart valve: Secondary | ICD-10-CM

## 2024-03-13 NOTE — Progress Notes (Signed)
 Cardiac Individual Treatment Plan  Patient Details  Name: Carolyn Rowland MRN: [id number] Date of Birth: [birth year] Referring Provider:   Flowsheet Row INTENSIVE CARDIAC REHAB ORIENT from 01/31/2024 in [facility]  Referring Provider Ozell Fell, MD    Initial Encounter Date:  Flowsheet Row INTENSIVE CARDIAC REHAB ORIENT from 01/31/2024 in [facility]  Date 01/31/24    Visit Diagnosis: 10/11/23 S/P TAVR (transcatheter aortic valve replacement)  Patient's Home Medications on Admission:  Current Outpatient Medications:    amLODipine  (NORVASC ) 2.5 MG tablet, Take 2.5 mg by mouth daily., Disp: , Rfl:    aspirin  81 MG EC tablet, Take 81 mg by mouth daily. , Disp: , Rfl:    azithromycin  (ZITHROMAX ) 500 MG tablet, Take 1 tablet (500 mg total) by mouth as directed. Take one tablet 1 hour before any dental work including cleanings., Disp: 6 tablet, Rfl: 12   cholecalciferol  (VITAMIN D3) 25 MCG (1000 UNIT) tablet, Take 1,000 Units by mouth daily., Disp: , Rfl:    isosorbide  mononitrate (IMDUR ) 60 MG 24 hr tablet, Take 1 tablet (60 mg total) by mouth daily., Disp: 30 tablet, Rfl: 1   LINZESS 72 MCG capsule, Take 72 mcg by mouth daily as needed (constipation)., Disp: , Rfl:    metoprolol  succinate (TOPROL -XL) 50 MG 24 hr tablet, Take 50 mg by mouth daily. Take with or immediately following a meal., Disp: , Rfl:    Multiple Minerals-Vitamins (CALCIUM  & VIT D3 BONE HEALTH PO), Take 1 tablet by mouth daily., Disp: , Rfl:    nitroGLYCERIN  (NITROSTAT ) 0.4 MG SL tablet, Place 1 tablet (0.4 mg total) under the tongue every 5 (five) minutes as needed for chest pain., Disp: 30 tablet, Rfl: 1   omeprazole (PRILOSEC) 40 MG capsule, Take 40 mg by mouth daily as needed (acid reflux). , Disp: , Rfl: 1   rosuvastatin  (CRESTOR ) 20 MG tablet, Take 20 mg by mouth daily., Disp: , Rfl:    spironolactone   (ALDACTONE ) 25 MG tablet, Take 12.5 mg by mouth daily. (Patient taking differently: Take 12.5 mg by mouth daily.), Disp: , Rfl:   Past Medical History: Past Medical History:  Diagnosis Date   Anemia    in younger years   Arthritis    hands; legs (08/17/2013)   Coronary artery disease    6 stents in 2015   GERD (gastroesophageal reflux disease)    H/O hiatal hernia    High cholesterol    Hx of adenomatous colonic polyps 08/15/2018   Hypertension    Radiation 11/28/14-01/02/15   Right breast 50 Gray   S/P TAVR (transcatheter aortic valve replacement)    Severe aortic stenosis    Vertigo     Tobacco Use: Social History   Tobacco Use  Smoking Status Former   Current packs/day: 0.00   Average packs/day: 1 pack/day for 52.0 years (52.0 ttl pk-yrs)   Types: Cigarettes   Start date: 04/04/1962   Quit date: 04/04/2014   Years since quitting: 9.9  Smokeless Tobacco Never    Labs: Review Flowsheet       Latest Ref Rng & Units 08/07/2013 12/29/2015 08/30/2023 10/11/2023  Labs for ITP Cardiac and Pulmonary Rehab  Cholestrol 0 - 200 mg/dL - 832  - -  LDL (calc) 0 - 99 mg/dL - 898  - -  HDL-C >59 mg/dL - 50  - -  Trlycerides <150 mg/dL - 81  - -  PH, Arterial 7.35 - 7.45 7.364  - 7.366  -  PCO2 arterial 32 - 48 mmHg 46.5  - 43.1  -  Bicarbonate 20.0 - 28.0 mmol/L 26.5  27.6  - 19.7  26.6  24.7  -  TCO2 22 - 32 mmol/L 28  29  - 21  28  26  23    Acid-base deficit 0.0 - 2.0 mmol/L - - 8.0  1.0  -  O2 Saturation % 94.0  67.0  - 66  76  93  -    Details       Multiple values from one day are sorted in reverse-chronological order         Capillary Blood Glucose: Lab Results  Component Value Date   GLUCAP 92 02/10/2024     Exercise Target Goals: Exercise Program Goal: Individual exercise prescription set using results from initial 6 min walk test and THRR while considering  patient's activity barriers and safety.   Exercise Prescription Goal: Initial exercise prescription  builds to 30-45 minutes a day of aerobic activity, 2-3 days per week.  Home exercise guidelines will be given to patient during program as part of exercise prescription that the participant will acknowledge.  Activity Barriers & Risk Stratification:  Activity Barriers & Cardiac Risk Stratification - 01/31/24 1000       Activity Barriers & Cardiac Risk Stratification   Activity Barriers Balance Concerns;Arthritis;Joint Problems;History of Falls;Deconditioning;Muscular Weakness;Shortness of Breath;Left Hip Replacement;Right Hip Replacement    Cardiac Risk Stratification High          6 Minute Walk:  6 Minute Walk     Row Name 01/31/24 0959         6 Minute Walk   Phase Initial     Distance 480 feet     Walk Time 6 minutes     # of Rest Breaks 1  1:54 to 3:56     MPH 1     METS 0.5     RPE 11     Perceived Dyspnea  1     VO2 Peak 1.5     Symptoms Yes (comment)     Comments SOB, RPD =1, Posterior thigh pain 4/10, both better with rest     Resting HR 67 bpm     Resting BP 126/86     Resting Oxygen  Saturation  98 %     Exercise Oxygen  Saturation  during 6 min walk 97 %     Max Ex. HR 89 bpm     Max Ex. BP 136/81     2 Minute Post BP 124/78        Oxygen  Initial Assessment:   Oxygen  Re-Evaluation:   Oxygen  Discharge (Final Oxygen  Re-Evaluation):   Initial Exercise Prescription:  Initial Exercise Prescription - 01/31/24 1000       Date of Initial Exercise RX and Referring Provider   Date 01/31/24    Referring Provider Ozell Fell, MD    Expected Discharge Date 04/18/24      NuStep   Level 3    SPM 70    Minutes 25    METs 1      Prescription Details   Frequency (times per week) 3    Duration Progress to 30 minutes of continuous aerobic without signs/symptoms of physical distress      Intensity   THRR 40-80% of Max Heartrate 56-112    Ratings of Perceived Exertion 11-13    Perceived Dyspnea 0-4  Progression   Progression Continue progressive  overload as per policy without signs/symptoms or physical distress.      Resistance Training   Training Prescription Yes    Weight 2 lbs    Reps 10-15          Perform Capillary Blood Glucose checks as needed.  Exercise Prescription Changes:   Exercise Prescription Changes     Row Name 02/06/24 1647             Response to Exercise   Blood Pressure (Admit) 98/60       Blood Pressure (Exercise) 140/82       Blood Pressure (Exit) 142/82       Heart Rate (Admit) 69 bpm       Heart Rate (Exercise) 88 bpm       Heart Rate (Exit) 68 bpm       Rating of Perceived Exertion (Exercise) 9       Perceived Dyspnea (Exercise) 0       Symptoms Fatigue, no weights today.       Comments Pt first day in the [facility] ICR program       Duration Progress to 30 minutes of  aerobic without signs/symptoms of physical distress       Intensity THRR unchanged         Progression   Progression Continue to progress workloads to maintain intensity without signs/symptoms of physical distress.       Average METs 1.7         Resistance Training   Training Prescription No       Weight 2 lbs       Reps 10-15       Time 10 Minutes         NuStep   Level 1       SPM 65       Minutes 24       METs 1.7          Exercise Comments:   Exercise Comments     Row Name 02/06/24 1652 03/08/24 1023         Exercise Comments Pt first day in the [facility] ICR program. Pt tolerated exercise fair, she was very fatigued after 25 mins on the Nustep, No weights today. Will work on endurance over time. Average MET's today 1.7. Patient on hold due to recent ER visit. Will review education when cleared to return. Last session 02/10/24         Exercise Goals and Review:   Exercise Goals     Row Name 01/31/24 0808             Exercise Goals   Increase Physical Activity Yes       Intervention Provide advice, education, support and counseling about physical activity/exercise needs.;Develop an  individualized exercise prescription for aerobic and resistive training based on initial evaluation findings, risk stratification, comorbidities and participant's personal goals.       Expected Outcomes Long Term: Add in home exercise to make exercise part of routine and to increase amount of physical activity.;Long Term: Exercising regularly at least 3-5 days a week.;Short Term: Attend rehab on a regular basis to increase amount of physical activity.       Increase Strength and Stamina Yes       Intervention Provide advice, education, support and counseling about physical activity/exercise needs.;Develop an individualized exercise prescription for aerobic and resistive training based on initial evaluation findings, risk stratification, comorbidities and participant's  personal goals.       Expected Outcomes Short Term: Increase workloads from initial exercise prescription for resistance, speed, and METs.;Short Term: Perform resistance training exercises routinely during rehab and add in resistance training at home;Long Term: Improve cardiorespiratory fitness, muscular endurance and strength as measured by increased METs and functional capacity ( )       Able to understand and use rate of perceived exertion (RPE) scale Yes       Intervention Provide education and explanation on how to use RPE scale       Expected Outcomes Short Term: Able to use RPE daily in rehab to express subjective intensity level;Long Term:  Able to use RPE to guide intensity level when exercising independently       Knowledge and understanding of Target Heart Rate Range (THRR) Yes       Intervention Provide education and explanation of THRR including how the numbers were predicted and where they are located for reference       Expected Outcomes Short Term: Able to state/look up THRR;Long Term: Able to use THRR to govern intensity when exercising independently;Short Term: Able to use daily as guideline for intensity in rehab        Understanding of Exercise Prescription Yes       Intervention Provide education, explanation, and written materials on patient's individual exercise prescription       Expected Outcomes Short Term: Able to explain program exercise prescription;Long Term: Able to explain home exercise prescription to exercise independently          Exercise Goals Re-Evaluation :  Exercise Goals Re-Evaluation     Row Name 02/06/24 1649             Exercise Goal Re-Evaluation   Exercise Goals Review Increase Physical Activity;Increase Strength and Stamina;Able to understand and use rate of perceived exertion (RPE) scale;Knowledge and understanding of Target Heart Rate Range (THRR);Understanding of Exercise Prescription       Comments Pt first day in the [facility] ICR program. Pt tolerated exercise fair, she was very fatigued after 25 mins on the Nustep, No weights today. Will work on endurance over time. Average MET's today 1.7.       Expected Outcomes Will continue to monitor and progress workloads gradually without symptoms          Discharge Exercise Prescription (Final Exercise Prescription Changes):  Exercise Prescription Changes - 02/06/24 1647       Response to Exercise   Blood Pressure (Admit) 98/60    Blood Pressure (Exercise) 140/82    Blood Pressure (Exit) 142/82    Heart Rate (Admit) 69 bpm    Heart Rate (Exercise) 88 bpm    Heart Rate (Exit) 68 bpm    Rating of Perceived Exertion (Exercise) 9    Perceived Dyspnea (Exercise) 0    Symptoms Fatigue, no weights today.    Comments Pt first day in the [facility] ICR program    Duration Progress to 30 minutes of  aerobic without signs/symptoms of physical distress    Intensity THRR unchanged      Progression   Progression Continue to progress workloads to maintain intensity without signs/symptoms of physical distress.    Average METs 1.7      Resistance Training   Training Prescription No    Weight 2 lbs    Reps 10-15    Time 10  Minutes      NuStep   Level 1    SPM 65  Minutes 24    METs 1.7          Nutrition:  Target Goals: Understanding of nutrition guidelines, daily intake of sodium 1500mg , cholesterol 200mg , calories 30% from fat and 7% or less from saturated fats, daily to have 5 or more servings of fruits and vegetables.  Biometrics:  Pre Biometrics - 01/31/24 0900       Pre Biometrics   Waist Circumference 42 inches    Hip Circumference 49.5 inches    Waist to Hip Ratio 0.85 %    Triceps Skinfold 33 mm    % Body Fat 46.8 %    Grip Strength 14 kg    Flexibility --   Not performed   Single Leg Stand 2.12 seconds           Nutrition Therapy Plan and Nutrition Goals:  Nutrition Therapy & Goals - 02/10/24 1109       Nutrition Therapy   Diet Heart Healthy Diet    Drug/Food Interactions Statins/Certain Fruits      Personal Nutrition Goals   Nutrition Goal Patient to identify strategies for reducing cardiovascular risk by attending the [facility] education and nutrition series weekly.    Personal Goal #2 Patient to improve diet quality by using the plate method as a guide for meal planning to include lean protein/plant protein, fruits, vegetables, whole grains, nonfat dairy as part of a well-balanced diet.    Comments Carolyn Rowland has medical history of hx of right breast cancer (s/p lumpectomy, radiation, and antiestrogen therapy), CAD s/p previous PCI to RCA and LCx (2015), HTN, HLD, CKD stage IIIa, morbid obesity (BMI 38), RBBB, and severe AS s/p TAVR (10/11/23). No updated lipid panel available for review. Will continue to discuss strategies for weight loss including label reading, calorie density, the plate method as ag guide for meal planning ,etc. Patient will benefit from participation in intensive cardiac rehab for nutrition education, exercise, and lifestyle modification.      Intervention Plan   Intervention Prescribe, educate and counsel regarding individualized specific dietary  modifications aiming towards targeted core components such as weight, hypertension, lipid management, diabetes, heart failure and other comorbidities.;Nutrition handout(s) given to patient.    Expected Outcomes Short Term Goal: Understand basic principles of dietary content, such as calories, fat, sodium, cholesterol and nutrients.;Long Term Goal: Adherence to prescribed nutrition plan.          Nutrition Assessments:  MEDIFICTS Score Key: >=70 Need to make dietary changes  40-70 Heart Healthy Diet <= 40 Therapeutic Level Cholesterol Diet    Picture Your Plate Scores: <59 Unhealthy dietary pattern with much room for improvement. 41-50 Dietary pattern unlikely to meet recommendations for good health and room for improvement. 51-60 More healthful dietary pattern, with some room for improvement.  >60 Healthy dietary pattern, although there may be some specific behaviors that could be improved.    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 No updated lipid panel available for review. Will continue to discuss strategies for weight loss including label reading, calorie density, the plate method  as ag guide for meal planning ,etc. Patient will benefit from participation in intensive cardiac rehab for nutrition education, exercise, and lifestyle modification.          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 No updated lipid panel available for review. Will continue to discuss strategies for weight loss including label reading, calorie density, the plate method as ag guide for meal planning ,etc. Patient will benefit from participation in intensive cardiac rehab for nutrition education, exercise, and lifestyle modification.          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 No updated lipid panel available for review. Will continue to discuss strategies for weight loss including label reading, calorie density, the plate method as ag guide for meal planning ,etc. Patient will benefit from participation in intensive cardiac rehab for nutrition education, exercise, and lifestyle modification.          Psychosocial: Target Goals: Acknowledge presence or absence of significant depression and/or stress, maximize coping skills, provide positive support system. Participant is able to verbalize types and ability to use techniques and skills needed for reducing stress and depression.  Initial Review & Psychosocial Screening:  Initial Psych Review & Screening - 01/31/24 1251       Initial Review   Current issues with Current Anxiety/Panic;Current Depression      Family Dynamics   Good Support System? Yes   Pt has daughter for support system     Barriers   Psychosocial barriers to participate in program The patient should benefit from training in stress management and relaxation.      Screening  Interventions   Interventions Encouraged to exercise;To provide support and resources with identified psychosocial needs;Provide feedback about the scores to participant    Expected Outcomes Long Term Goal: Stressors or current issues are controlled or eliminated.;Short Term goal: Identification and review with participant of any Quality of Life or Depression concerns found by scoring the questionnaire.;Long Term goal: The participant improves quality of Life and PHQ9 Scores as seen by post scores and/or verbalization of changes          Quality of Life Scores:  Quality of Life - 01/31/24 1104       Quality of Life   Select Quality of Life      Quality of Life Scores   Health/Function Pre 15.57 %    Socioeconomic Pre 21.17 %    Psych/Spiritual Pre 26 %    Family Pre 20 %    GLOBAL Pre 19.57 %         Scores of 19 and below usually indicate a poorer quality of life in these areas.  A  difference of  2-3 points is a clinically meaningful difference.  A difference of 2-3 points in the total score of the Quality of Life Index has been associated with significant improvement in overall quality of life, self-image, physical symptoms, and general health in studies assessing change in quality of life.  PHQ-9: Review Flowsheet  More data may exist      01/31/2024 01/23/2020 12/10/2014 03/08/2014 11/05/2013  Depression screen PHQ 2/9  Decreased Interest 3 0 0 0 0  Down, Depressed, Hopeless 1 0 0 1 0  PHQ - 2 Score 4 0 0 1 0  Altered sleeping 1 - - - -  Tired, decreased energy 1 - - - -  Change in appetite 1 - - - -  Feeling bad or failure about yourself  0 - - - -  Trouble concentrating 0 - - - -  Moving slowly or fidgety/restless 0 - - - -  Suicidal thoughts 0 - - - -  PHQ-9 Score 7 - - - -  Difficult doing work/chores Not difficult at all - - - -   Interpretation of Total Score  Total Score Depression Severity:  1-4 = Minimal depression, 5-9 = Mild depression, 10-14 = Moderate  depression, 15-19 = Moderately severe depression, 20-27 = Severe depression   Psychosocial Evaluation and Intervention:   Psychosocial Re-Evaluation:  Psychosocial Re-Evaluation     Row Name 02/08/24 1425 02/13/24 0830 03/13/24 1409         Psychosocial Re-Evaluation   Current issues with Current Anxiety/Panic;Current Depression Current Anxiety/Panic;Current Depression Current Anxiety/Panic;Current Depression     Comments Carolyn Rowland did not voice any increased concerns or stressors on her first day of exercise will review PHQ9 in the upcoming week Exercise is currently on hold. Carolyn Rowland has been cleared to return to exercise at cardiac rehab per Dr Levern.     Interventions Encouraged to attend Cardiac Rehabilitation for the exercise;Stress management education;Relaxation education Encouraged to attend Cardiac Rehabilitation for the exercise;Stress management education;Relaxation education Encouraged to attend Cardiac Rehabilitation for the exercise;Stress management education;Relaxation education     Continue Psychosocial Services  Follow up required by staff Follow up required by staff Follow up required by staff        Psychosocial Discharge (Final Psychosocial Re-Evaluation):  Psychosocial Re-Evaluation - 03/13/24 1409       Psychosocial Re-Evaluation   Current issues with Current Anxiety/Panic;Current Depression    Comments Carolyn Rowland has been cleared to return to exercise at cardiac rehab per Dr Levern.    Interventions Encouraged to attend Cardiac Rehabilitation for the exercise;Stress management education;Relaxation education    Continue Psychosocial Services  Follow up required by staff          Vocational Rehabilitation: Provide vocational rehab assistance to qualifying candidates.   Vocational Rehab Evaluation & Intervention:  Vocational Rehab - 01/31/24 0809       Initial Vocational Rehab Evaluation & Intervention   Assessment shows need for Vocational Rehabilitation No    Pt is retired         Education: Education Goals: Education classes will be provided on a weekly basis, covering required topics. Participant will state understanding/return demonstration of topics presented.    Education     Row Name 02/06/24 1500     Education   Cardiac Education Topics [facility]   Glass blower/designer Nutrition   Nutrition Workshop Label Reading   Instruction Review Code 1- Tax inspector  Class Start Time 1400   Class Stop Time 1450   Class Time Calculation (min) 50 min    Row Name 02/10/24 1500     Education   Cardiac Education Topics [facility]   Select Core Videos     Core Videos   Educator Dietitian   Select Nutrition   Nutrition Other   Instruction Review Code 1- Verbalizes Understanding   Class Start Time 1400   Class Stop Time 1445   Class Time Calculation (min) 45 min      Core Videos: Exercise    Move It!  Clinical staff conducted group or individual video education with verbal and written material and guidebook.  Patient learns the recommended [facility] exercise program. Exercise with the goal of living a long, healthy life. Some of the health benefits of exercise include controlled diabetes, healthier blood pressure levels, improved cholesterol levels, improved heart and lung capacity, improved sleep, and better body composition. Everyone should speak with their doctor before starting or changing an exercise routine.  Biomechanical Limitations Clinical staff conducted group or individual video education with verbal and written material and guidebook.  Patient learns how biomechanical limitations can impact exercise and how we can mitigate and possibly overcome limitations to have an impactful and balanced exercise routine.  Body Composition Clinical staff conducted group or individual video education with verbal and written material and guidebook.  Patient learns that body composition  (ratio of muscle mass to fat mass) is a key component to assessing overall fitness, rather than body weight alone. Increased fat mass, especially visceral belly fat, can put us  at increased risk for metabolic syndrome, type 2 diabetes, heart disease, and even death. It is recommended to combine diet and exercise (cardiovascular and resistance training) to improve your body composition. Seek guidance from your physician and exercise physiologist before implementing an exercise routine.  Exercise Action Plan Clinical staff conducted group or individual video education with verbal and written material and guidebook.  Patient learns the recommended strategies to achieve and enjoy long-term exercise adherence, including variety, self-motivation, self-efficacy, and positive decision making. Benefits of exercise include fitness, good health, weight management, more energy, better sleep, less stress, and overall well-being.  Medical   Heart Disease Risk Reduction Clinical staff conducted group or individual video education with verbal and written material and guidebook.  Patient learns our heart is our most vital organ as it circulates oxygen , nutrients, white blood cells, and hormones throughout the entire body, and carries waste away. Data supports a plant-based eating plan like the Pritikin Program for its effectiveness in slowing progression of and reversing heart disease. The video provides a number of recommendations to address heart disease.   Metabolic Syndrome and Belly Fat  Clinical staff conducted group or individual video education with verbal and written material and guidebook.  Patient learns what metabolic syndrome is, how it leads to heart disease, and how one can reverse it and keep it from coming back. You have metabolic syndrome if you have 3 of the following 5 criteria: abdominal obesity, high blood pressure, high triglycerides, low HDL cholesterol, and high blood sugar.  Hypertension and  Heart Disease Clinical staff conducted group or individual video education with verbal and written material and guidebook.  Patient learns that high blood pressure, or hypertension, is very common in the [place] . Hypertension is largely due to excessive salt intake, but other important risk factors include being overweight, physical inactivity, drinking too much alcohol , smoking, and not eating enough potassium from  fruits and vegetables. High blood pressure is a leading risk factor for heart attack, stroke, congestive heart failure, dementia, kidney failure, and premature death. Long-term effects of excessive salt intake include stiffening of the arteries and thickening of heart muscle and organ damage. Recommendations include ways to reduce hypertension and the risk of heart disease.  Diseases of Our Time - Focusing on Diabetes Clinical staff conducted group or individual video education with verbal and written material and guidebook.  Patient learns why the best way to stop diseases of our time is prevention, through food and other lifestyle changes. Medicine (such as prescription pills and surgeries) is often only a Band-Aid on the problem, not a long-term solution. Most common diseases of our time include obesity, type 2 diabetes, hypertension, heart disease, and cancer. The Pritikin Program is recommended and has been proven to help reduce, reverse, and/or prevent the damaging effects of metabolic syndrome.  Nutrition   Overview of the Pritikin Eating Plan  Clinical staff conducted group or individual video education with verbal and written material and guidebook.  Patient learns about the Pritikin Eating Plan for disease risk reduction. The Pritikin Eating Plan emphasizes a wide variety of unrefined, minimally-processed carbohydrates, like fruits, vegetables, whole grains, and legumes. Go, Caution, and Stop food choices are explained. Plant-based and lean animal proteins are emphasized.  Rationale provided for low sodium intake for blood pressure control, low added sugars for blood sugar stabilization, and low added fats and oils for coronary artery disease risk reduction and weight management.  Calorie Density  Clinical staff conducted group or individual video education with verbal and written material and guidebook.  Patient learns about calorie density and how it impacts the Pritikin Eating Plan. Knowing the characteristics of the food you choose will help you decide whether those foods will lead to weight gain or weight loss, and whether you want to consume more or less of them. Weight loss is usually a side effect of the Pritikin Eating Plan because of its focus on low calorie-dense foods.  Label Reading  Clinical staff conducted group or individual video education with verbal and written material and guidebook.  Patient learns about the Pritikin recommended label reading guidelines and corresponding recommendations regarding calorie density, added sugars, sodium content, and whole grains.  Dining Out - Part 1  Clinical staff conducted group or individual video education with verbal and written material and guidebook.  Patient learns that restaurant meals can be sabotaging because they can be so high in calories, fat, sodium, and/or sugar. Patient learns recommended strategies on how to positively address this and avoid unhealthy pitfalls.  Facts on Fats  Clinical staff conducted group or individual video education with verbal and written material and guidebook.  Patient learns that lifestyle modifications can be just as effective, if not more so, as many medications for lowering your risk of heart disease. A Pritikin lifestyle can help to reduce your risk of inflammation and atherosclerosis (cholesterol build-up, or plaque, in the artery walls). Lifestyle interventions such as dietary choices and physical activity address the cause of atherosclerosis. A review of the types of  fats and their impact on blood cholesterol levels, along with dietary recommendations to reduce fat intake is also included.  Nutrition Action Plan  Clinical staff conducted group or individual video education with verbal and written material and guidebook.  Patient learns how to incorporate Pritikin recommendations into their lifestyle. Recommendations include planning and keeping personal health goals in mind as an important part of their  success.  Healthy Mind-Set    Healthy Minds, Bodies, Hearts  Clinical staff conducted group or individual video education with verbal and written material and guidebook.  Patient learns how to identify when they are stressed. Video will discuss the impact of that stress, as well as the many benefits of stress management. Patient will also be introduced to stress management techniques. The way we think, act, and feel has an impact on our hearts.  How Our Thoughts Can Heal Our Hearts  Clinical staff conducted group or individual video education with verbal and written material and guidebook.  Patient learns that negative thoughts can cause depression and anxiety. This can result in negative lifestyle behavior and serious health problems. Cognitive behavioral therapy is an effective method to help control our thoughts in order to change and improve our emotional outlook.  Additional Videos:  Exercise    Improving Performance  Clinical staff conducted group or individual video education with verbal and written material and guidebook.  Patient learns to use a non-linear approach by alternating intensity levels and lengths of time spent exercising to help burn more calories and lose more body fat. Cardiovascular exercise helps improve heart health, metabolism, hormonal balance, blood sugar control, and recovery from fatigue. Resistance training improves strength, endurance, balance, coordination, reaction time, metabolism, and muscle mass. Flexibility exercise  improves circulation, posture, and balance. Seek guidance from your physician and exercise physiologist before implementing an exercise routine and learn your capabilities and proper form for all exercise.  Introduction to Yoga  Clinical staff conducted group or individual video education with verbal and written material and guidebook.  Patient learns about yoga, a discipline of the coming together of mind, breath, and body. The benefits of yoga include improved flexibility, improved range of motion, better posture and core strength, increased lung function, weight loss, and positive self-image. Yoga's heart health benefits include lowered blood pressure, healthier heart rate, decreased cholesterol and triglyceride levels, improved immune function, and reduced stress. Seek guidance from your physician and exercise physiologist before implementing an exercise routine and learn your capabilities and proper form for all exercise.  Medical   Aging: Enhancing Your Quality of Life  Clinical staff conducted group or individual video education with verbal and written material and guidebook.  Patient learns key strategies and recommendations to stay in good physical health and enhance quality of life, such as prevention strategies, having an advocate, securing a Health Care Proxy and Power of Attorney, and keeping a list of medications and system for tracking them. It also discusses how to avoid risk for bone loss.  Biology of Weight Control  Clinical staff conducted group or individual video education with verbal and written material and guidebook.  Patient learns that weight gain occurs because we consume more calories than we burn (eating more, moving less). Even if your body weight is normal, you may have higher ratios of fat compared to muscle mass. Too much body fat puts you at increased risk for cardiovascular disease, heart attack, stroke, type 2 diabetes, and obesity-related cancers. In addition to  exercise, following the Pritikin Eating Plan can help reduce your risk.  Decoding Lab Results  Clinical staff conducted group or individual video education with verbal and written material and guidebook.  Patient learns that lab test reflects one measurement whose values change over time and are influenced by many factors, including medication, stress, sleep, exercise, food, hydration, pre-existing medical conditions, and more. It is recommended to use the knowledge from this video to  become more involved with your lab results and evaluate your numbers to speak with your doctor.   Diseases of Our Time - Overview  Clinical staff conducted group or individual video education with verbal and written material and guidebook.  Patient learns that according to the CDC, 50% to 70% of chronic diseases (such as obesity, type 2 diabetes, elevated lipids, hypertension, and heart disease) are avoidable through lifestyle improvements including healthier food choices, listening to satiety cues, and increased physical activity.  Sleep Disorders Clinical staff conducted group or individual video education with verbal and written material and guidebook.  Patient learns how good quality and duration of sleep are important to overall health and well-being. Patient also learns about sleep disorders and how they impact health along with recommendations to address them, including discussing with a physician.  Nutrition  Dining Out - Part 2 Clinical staff conducted group or individual video education with verbal and written material and guidebook.  Patient learns how to plan ahead and communicate in order to maximize their dining experience in a healthy and nutritious manner. Included are recommended food choices based on the type of restaurant the patient is visiting.   Fueling a Banker conducted group or individual video education with verbal and written material and guidebook.  There is a  strong connection between our food choices and our health. Diseases like obesity and type 2 diabetes are very prevalent and are in large-part due to lifestyle choices. The Pritikin Eating Plan provides plenty of food and hunger-curbing satisfaction. It is easy to follow, affordable, and helps reduce health risks.  Menu Workshop  Clinical staff conducted group or individual video education with verbal and written material and guidebook.  Patient learns that restaurant meals can sabotage health goals because they are often packed with calories, fat, sodium, and sugar. Recommendations include strategies to plan ahead and to communicate with the manager, chef, or server to help order a healthier meal.  Planning Your Eating Strategy  Clinical staff conducted group or individual video education with verbal and written material and guidebook.  Patient learns about the Pritikin Eating Plan and its benefit of reducing the risk of disease. The Pritikin Eating Plan does not focus on calories. Instead, it emphasizes high-quality, nutrient-rich foods. By knowing the characteristics of the foods, we choose, we can determine their calorie density and make informed decisions.  Targeting Your Nutrition Priorities  Clinical staff conducted group or individual video education with verbal and written material and guidebook.  Patient learns that lifestyle habits have a tremendous impact on disease risk and progression. This video provides eating and physical activity recommendations based on your personal health goals, such as reducing LDL cholesterol, losing weight, preventing or controlling type 2 diabetes, and reducing high blood pressure.  Vitamins and Minerals  Clinical staff conducted group or individual video education with verbal and written material and guidebook.  Patient learns different ways to obtain key vitamins and minerals, including through a recommended healthy diet. It is important to discuss all  supplements you take with your doctor.   Healthy Mind-Set    Smoking Cessation  Clinical staff conducted group or individual video education with verbal and written material and guidebook.  Patient learns that cigarette smoking and tobacco addiction pose a serious health risk which affects millions of people. Stopping smoking will significantly reduce the risk of heart disease, lung disease, and many forms of cancer. Recommended strategies for quitting are covered, including working with your doctor to  develop a successful plan.  Culinary   Becoming a Set designer conducted group or individual video education with verbal and written material and guidebook.  Patient learns that cooking at home can be healthy, cost-effective, quick, and puts them in control. Keys to cooking healthy recipes will include looking at your recipe, assessing your equipment needs, planning ahead, making it simple, choosing cost-effective seasonal ingredients, and limiting the use of added fats, salts, and sugars.  Cooking - Breakfast and Snacks  Clinical staff conducted group or individual video education with verbal and written material and guidebook.  Patient learns how important breakfast is to satiety and nutrition through the entire day. Recommendations include key foods to eat during breakfast to help stabilize blood sugar levels and to prevent overeating at meals later in the day. Planning ahead is also a key component.  Cooking - Educational psychologist conducted group or individual video education with verbal and written material and guidebook.  Patient learns eating strategies to improve overall health, including an approach to cook more at home. Recommendations include thinking of animal protein as a side on your plate rather than center stage and focusing instead on lower calorie dense options like vegetables, fruits, whole grains, and plant-based proteins, such as beans. Making sauces  in large quantities to freeze for later and leaving the skin on your vegetables are also recommended to maximize your experience.  Cooking - Healthy Salads and Dressing Clinical staff conducted group or individual video education with verbal and written material and guidebook.  Patient learns that vegetables, fruits, whole grains, and legumes are the foundations of the Pritikin Eating Plan. Recommendations include how to incorporate each of these in flavorful and healthy salads, and how to create homemade salad dressings. Proper handling of ingredients is also covered. Cooking - Soups and [employer] - Soups and Desserts Clinical staff conducted group or individual video education with verbal and written material and guidebook.  Patient learns that Pritikin soups and desserts make for easy, nutritious, and delicious snacks and meal components that are low in sodium, fat, sugar, and calorie density, while high in vitamins, minerals, and filling fiber. Recommendations include simple and healthy ideas for soups and desserts.   Overview     The Pritikin Solution Program Overview Clinical staff conducted group or individual video education with verbal and written material and guidebook.  Patient learns that the results of the Pritikin Program have been documented in more than 100 articles published in peer-reviewed journals, and the benefits include reducing risk factors for (and, in some cases, even reversing) high cholesterol, high blood pressure, type 2 diabetes, obesity, and more! An overview of the three key pillars of the Pritikin Program will be covered: eating well, doing regular exercise, and having a healthy mind-set.  WORKSHOPS  Exercise: Exercise Basics: Building Your Action Plan Clinical staff led group instruction and group discussion with PowerPoint presentation and patient guidebook. To enhance the learning environment the use of posters, models and videos may be added. At the  conclusion of this workshop, patients will comprehend the difference between physical activity and exercise, as well as the benefits of incorporating both, into their routine. Patients will understand the FITT (Frequency, Intensity, Time, and Type) principle and how to use it to build an exercise action plan. In addition, safety concerns and other considerations for exercise and cardiac rehab will be addressed by the presenter. The purpose of this lesson is to promote a comprehensive and  effective weekly exercise routine in order to improve patients' overall level of fitness.   Managing Heart Disease: Your Path to a Healthier Heart Clinical staff led group instruction and group discussion with PowerPoint presentation and patient guidebook. To enhance the learning environment the use of posters, models and videos may be added.At the conclusion of this workshop, patients will understand the anatomy and physiology of the heart. Additionally, they will understand how Pritikin's three pillars impact the risk factors, the progression, and the management of heart disease.  The purpose of this lesson is to provide a high-level overview of the heart, heart disease, and how the Pritikin lifestyle positively impacts risk factors.  Exercise Biomechanics Clinical staff led group instruction and group discussion with PowerPoint presentation and patient guidebook. To enhance the learning environment the use of posters, models and videos may be added. Patients will learn how the structural parts of their bodies function and how these functions impact their daily activities, movement, and exercise. Patients will learn how to promote a neutral spine, learn how to manage pain, and identify ways to improve their physical movement in order to promote healthy living. The purpose of this lesson is to expose patients to common physical limitations that impact physical activity. Participants will learn practical ways  to adapt and manage aches and pains, and to minimize their effect on regular exercise. Patients will learn how to maintain good posture while sitting, walking, and lifting.  Balance Training and Fall Prevention  Clinical staff led group instruction and group discussion with PowerPoint presentation and patient guidebook. To enhance the learning environment the use of posters, models and videos may be added. At the conclusion of this workshop, patients will understand the importance of their sensorimotor skills (vision, proprioception, and the vestibular system) in maintaining their ability to balance as they age. Patients will apply a variety of balancing exercises that are appropriate for their current level of function. Patients will understand the common causes for poor balance, possible solutions to these problems, and ways to modify their physical environment in order to minimize their fall risk. The purpose of this lesson is to teach patients about the importance of maintaining balance as they age and ways to minimize their risk of falling.  WORKSHOPS   Nutrition:  Fueling a Ship broker led group instruction and group discussion with PowerPoint presentation and patient guidebook. To enhance the learning environment the use of posters, models and videos may be added. Patients will review the foundational principles of the Pritikin Eating Plan and understand what constitutes a serving size in each of the food groups. Patients will also learn Pritikin-friendly foods that are better choices when away from home and review make-ahead meal and snack options. Calorie density will be reviewed and applied to three nutrition priorities: weight maintenance, weight loss, and weight gain. The purpose of this lesson is to reinforce (in a group setting) the key concepts around what patients are recommended to eat and how to apply these guidelines when away from home by planning and selecting  Pritikin-friendly options. Patients will understand how calorie density may be adjusted for different weight management goals.  Mindful Eating  Clinical staff led group instruction and group discussion with PowerPoint presentation and patient guidebook. To enhance the learning environment the use of posters, models and videos may be added. Patients will briefly review the concepts of the Pritikin Eating Plan and the importance of low-calorie dense foods. The concept of mindful eating will be introduced as well  as the importance of paying attention to internal hunger signals. Triggers for non-hunger eating and techniques for dealing with triggers will be explored. The purpose of this lesson is to provide patients with the opportunity to review the basic principles of the Pritikin Eating Plan, discuss the value of eating mindfully and how to measure internal cues of hunger and fullness using the Hunger Scale. Patients will also discuss reasons for non-hunger eating and learn strategies to use for controlling emotional eating.  Targeting Your Nutrition Priorities Clinical staff led group instruction and group discussion with PowerPoint presentation and patient guidebook. To enhance the learning environment the use of posters, models and videos may be added. Patients will learn how to determine their genetic susceptibility to disease by reviewing their family history. Patients will gain insight into the importance of diet as part of an overall healthy lifestyle in mitigating the impact of genetics and other environmental insults. The purpose of this lesson is to provide patients with the opportunity to assess their personal nutrition priorities by looking at their family history, their own health history and current risk factors. Patients will also be able to discuss ways of prioritizing and modifying the Pritikin Eating Plan for their highest risk areas  Menu  Clinical staff led group instruction and group  discussion with PowerPoint presentation and patient guidebook. To enhance the learning environment the use of posters, models and videos may be added. Using menus brought in from [employer], or printed from [employer], patients will apply the Pritikin dining out guidelines that were presented in the [employer] video. Patients will also be able to practice these guidelines in a variety of provided scenarios. The purpose of this lesson is to provide patients with the opportunity to practice hands-on learning of the Pritikin Dining Out guidelines with actual menus and practice scenarios.  Label Reading Clinical staff led group instruction and group discussion with PowerPoint presentation and patient guidebook. To enhance the learning environment the use of posters, models and videos may be added. Patients will review and discuss the Pritikin label reading guidelines presented in Pritikin's Label Reading Educational series video. Using fool labels brought in from local grocery stores and markets, patients will apply the label reading guidelines and determine if the packaged food meet the Pritikin guidelines. The purpose of this lesson is to provide patients with the opportunity to review, discuss, and practice hands-on learning of the Pritikin Label Reading guidelines with actual packaged food labels. Cooking School  Pritikin's [employer] are designed to teach patients ways to prepare quick, simple, and affordable recipes at home. The importance of nutrition's role in chronic disease risk reduction is reflected in its emphasis in the overall Pritikin program. By learning how to prepare essential core Pritikin Eating Plan recipes, patients will increase control over what they eat; be able to customize the flavor of foods without the use of added salt, sugar, or fat; and improve the quality of the food they consume. By learning a set of core recipes which are easily  assembled, quickly prepared, and affordable, patients are more likely to prepare more healthy foods at home. These workshops focus on convenient breakfasts, simple entres, side dishes, and desserts which can be prepared with minimal effort and are consistent with nutrition recommendations for cardiovascular risk reduction. Cooking [employer] are taught by a Armed forces logistics/support/administrative officer (RD) who has been trained by the [employer]. The chef or RD has a clear understanding of the  importance of minimizing - if not completely eliminating - added fat, sugar, and sodium in recipes. Throughout the series of Cooking School Workshop sessions, patients will learn about healthy ingredients and efficient methods of cooking to build confidence in their capability to prepare    Cooking School weekly topics:  Adding Flavor- Sodium-Free  Fast and Healthy Breakfasts  Powerhouse Plant-Based Proteins  Satisfying Salads and Dressings  Simple Sides and Sauces  International Cuisine-Spotlight on the [employer] Zones  Delicious Desserts  Savory Soups  [employer] - Meals in a Astronomer Appetizers and Snacks  Comforting Weekend Breakfasts  One-Pot Wonders   Fast Evening Meals  Landscape architect Your [facility] Plate  WORKSHOPS   Healthy Mindset (Psychosocial):  Focused Goals, Sustainable Changes Clinical staff led group instruction and group discussion with PowerPoint presentation and patient guidebook. To enhance the learning environment the use of posters, models and videos may be added. Patients will be able to apply effective goal setting strategies to establish at least one personal goal, and then take consistent, meaningful action toward that goal. They will learn to identify common barriers to achieving personal goals and develop strategies to overcome them. Patients will also gain an understanding of how our mind-set can impact our ability to achieve goals and the  importance of cultivating a positive and growth-oriented mind-set. The purpose of this lesson is to provide patients with a deeper understanding of how to set and achieve personal goals, as well as the tools and strategies needed to overcome common obstacles which may arise along the way.  From Head to Heart: The Power of a Healthy Outlook  Clinical staff led group instruction and group discussion with PowerPoint presentation and patient guidebook. To enhance the learning environment the use of posters, models and videos may be added. Patients will be able to recognize and describe the impact of emotions and mood on physical health. They will discover the importance of self-care and explore self-care practices which may work for them. Patients will also learn how to utilize the 4 C's to cultivate a healthier outlook and better manage stress and challenges. The purpose of this lesson is to demonstrate to patients how a healthy outlook is an essential part of maintaining good health, especially as they continue their cardiac rehab journey.  Healthy Sleep for a Healthy Heart Clinical staff led group instruction and group discussion with PowerPoint presentation and patient guidebook. To enhance the learning environment the use of posters, models and videos may be added. At the conclusion of this workshop, patients will be able to demonstrate knowledge of the importance of sleep to overall health, well-being, and quality of life. They will understand the symptoms of, and treatments for, common sleep disorders. Patients will also be able to identify daytime and nighttime behaviors which impact sleep, and they will be able to apply these tools to help manage sleep-related challenges. The purpose of this lesson is to provide patients with a general overview of sleep and outline the importance of quality sleep. Patients will learn about a few of the most common sleep disorders. Patients will also be introduced to the  concept of "sleep hygiene," and discover ways to self-manage certain sleeping problems through simple daily behavior changes. Finally, the workshop will motivate patients by clarifying the links between quality sleep and their goals of heart-healthy living.   Recognizing and Reducing Stress Clinical staff led group instruction and group discussion with PowerPoint presentation and patient guidebook. To enhance the learning environment  the use of posters, models and videos may be added. At the conclusion of this workshop, patients will be able to understand the types of stress reactions, differentiate between acute and chronic stress, and recognize the impact that chronic stress has on their health. They will also be able to apply different coping mechanisms, such as reframing negative self-talk. Patients will have the opportunity to practice a variety of stress management techniques, such as deep abdominal breathing, progressive muscle relaxation, and/or guided imagery.  The purpose of this lesson is to educate patients on the role of stress in their lives and to provide healthy techniques for coping with it.  Learning Barriers/Preferences:  Learning Barriers/Preferences - 01/31/24 1008       Learning Barriers/Preferences   Learning Barriers Sight   wears glasses   Learning Preferences Audio;Computer/Internet;Group Instruction;Individual Instruction;Pictoral;Skilled Demonstration;Verbal Instruction;Video;Written Material          Education Topics:  Knowledge Questionnaire Score:  Knowledge Questionnaire Score - 01/31/24 1105       Knowledge Questionnaire Score   Pre Score 18/24          Core Components/Risk Factors/Patient Goals at Admission:  Personal Goals and Risk Factors at Admission - 01/31/24 1006       Core Components/Risk Factors/Patient Goals on Admission    Weight Management Obesity;Weight Loss    Hypertension Yes    Intervention Provide education on lifestyle  modifcations including regular physical activity/exercise, weight management, moderate sodium restriction and increased consumption of fresh fruit, vegetables, and low fat dairy, alcohol  moderation, and smoking cessation.;Monitor prescription use compliance.    Expected Outcomes Short Term: Continued assessment and intervention until BP is < 140/65mm HG in hypertensive participants. < 130/68mm HG in hypertensive participants with diabetes, heart failure or chronic kidney disease.;Long Term: Maintenance of blood pressure at goal levels.    Lipids Yes    Intervention Provide education and support for participant on nutrition & aerobic/resistive exercise along with prescribed medications to achieve LDL 70mg , HDL >40mg .    Expected Outcomes Short Term: Participant states understanding of desired cholesterol values and is compliant with medications prescribed. Participant is following exercise prescription and nutrition guidelines.;Long Term: Cholesterol controlled with medications as prescribed, with individualized exercise RX and with personalized nutrition plan. Value goals: LDL < 70mg , HDL > 40 mg.    Stress Yes    Intervention Offer individual and/or small group education and counseling on adjustment to heart disease, stress management and health-related lifestyle change. Teach and support self-help strategies.;Refer participants experiencing significant psychosocial distress to appropriate mental health specialists for further evaluation and treatment. When possible, include family members and significant others in education/counseling sessions.    Expected Outcomes Short Term: Participant demonstrates changes in health-related behavior, relaxation and other stress management skills, ability to obtain effective social support, and compliance with psychotropic medications if prescribed.;Long Term: Emotional wellbeing is indicated by absence of clinically significant psychosocial distress or social isolation.           Core Components/Risk Factors/Patient Goals Review:   Goals and Risk Factor Review     Row Name 02/08/24 1428 02/13/24 0830 03/13/24 1410         Core Components/Risk Factors/Patient Goals Review   Personal Goals Review Weight Management/Obesity;Lipids;Hypertension;Stress Weight Management/Obesity;Lipids;Hypertension;Stress Weight Management/Obesity;Lipids;Hypertension;Stress     Review Carolyn Rowland started cardiac rehab on 02/08/24. Carolyn Rowland did fair with exercise for her fitness level. Carolyn Rowland is somewhat deconditioned. Carolyn Rowland started cardiac rehab on 02/08/24. Exercise is currently on hold Carolyn Rowland has been cleared to return  to exercise at cardiac rehab. Last day of exercise was on 02/10/24     Expected Outcomes Atticus will continue to participate in cardiac rehab for exercise, nutrition and lifestyle modifications Mayra will continue to participate in cardiac rehab for exercise, nutrition and lifestyle modifications when cleared to return Carolyn Rowland will continue to participate in cardiac rehab for exercise, nutrition and lifestyle modifications when cleared to return        Core Components/Risk Factors/Patient Goals at Discharge (Final Review):   Goals and Risk Factor Review - 03/13/24 1410       Core Components/Risk Factors/Patient Goals Review   Personal Goals Review Weight Management/Obesity;Lipids;Hypertension;Stress    Review Carolyn Rowland has been cleared to return to exercise at cardiac rehab. Last day of exercise was on 02/10/24    Expected Outcomes Carolyn Rowland will continue to participate in cardiac rehab for exercise, nutrition and lifestyle modifications when cleared to return          ITP Comments:  ITP Comments     Row Name 01/31/24 9192 02/08/24 1423 02/13/24 0828 03/13/24 1408     ITP Comments Wilbert Bihari, MD: Medical Director.  Introduction to the [facility] Program/Intensive Cardiac Rehab.  Initial orientation packet reviewed with the patient. 30 Day ITP Review. Sadeen started  cardiac rehab on 02/06/24. Sydni did fair with exercise for her fitness level. 30 Day ITP Review. Solita started cardiac rehab on 02/06/24. Rhina  exercise is currently on hold due to symptoms reported on 02/10/24. 30 Day ITP Review. Kriya has been cleared to return to cardiac rehab per Dr Levern.       Comments: See ITP comments.[name] RN BSN

## 2024-03-14 ENCOUNTER — Encounter (HOSPITAL_COMMUNITY)

## 2024-03-14 ENCOUNTER — Ambulatory Visit (HOSPITAL_COMMUNITY)

## 2024-03-16 ENCOUNTER — Ambulatory Visit (HOSPITAL_COMMUNITY)

## 2024-03-16 ENCOUNTER — Encounter (HOSPITAL_COMMUNITY)

## 2024-03-19 ENCOUNTER — Encounter (HOSPITAL_COMMUNITY): Admission: RE | Admit: 2024-03-19 | Source: Ambulatory Visit

## 2024-03-19 ENCOUNTER — Ambulatory Visit (HOSPITAL_COMMUNITY)

## 2024-03-20 ENCOUNTER — Telehealth (HOSPITAL_COMMUNITY): Payer: Self-pay | Admitting: *Deleted

## 2024-03-20 NOTE — Telephone Encounter (Signed)
 Left message to call cardiac rehab will discharge at this time due to non attendance. Carolyn Rowland attended 6 exercise and education classes between 03/01/24- 02/10/24. Patient was transported to the ED on 02/10/24 due to complaints of feeling lightheaded. Carolyn Rowland was cleared to return to exercise by her primary cardiologist. Carolyn Rowland did not return and has been discharged due to non attendance.Carolyn Elpidio Quan RN BSN

## 2024-03-21 ENCOUNTER — Encounter (HOSPITAL_COMMUNITY)

## 2024-03-21 ENCOUNTER — Ambulatory Visit (HOSPITAL_COMMUNITY)

## 2024-03-21 NOTE — Progress Notes (Signed)
 Remote PPM Transmission

## 2024-03-23 ENCOUNTER — Encounter (HOSPITAL_COMMUNITY)

## 2024-03-23 ENCOUNTER — Ambulatory Visit (HOSPITAL_COMMUNITY)

## 2024-03-26 ENCOUNTER — Ambulatory Visit (HOSPITAL_COMMUNITY)

## 2024-03-26 ENCOUNTER — Encounter (HOSPITAL_COMMUNITY)

## 2024-03-28 ENCOUNTER — Encounter (HOSPITAL_COMMUNITY)

## 2024-03-28 ENCOUNTER — Ambulatory Visit (HOSPITAL_COMMUNITY)

## 2024-03-30 ENCOUNTER — Ambulatory Visit (HOSPITAL_COMMUNITY)

## 2024-03-30 ENCOUNTER — Encounter (HOSPITAL_COMMUNITY)

## 2024-04-02 ENCOUNTER — Telehealth (HOSPITAL_COMMUNITY): Payer: Self-pay

## 2024-04-02 ENCOUNTER — Encounter (HOSPITAL_COMMUNITY)

## 2024-04-02 ENCOUNTER — Ambulatory Visit (HOSPITAL_COMMUNITY)

## 2024-04-02 NOTE — Telephone Encounter (Signed)
 Patient called stating she would like to return to cardiac rehab as soon as possible. Informed patient we will take a look at her referral and see what would need to be done for her to return.

## 2024-04-03 ENCOUNTER — Telehealth (HOSPITAL_COMMUNITY): Payer: Self-pay

## 2024-04-03 NOTE — Telephone Encounter (Signed)
 Per nurse navigator, due to patient being discharged for non-attendance after receiving clearance to return, patient will need a new referral from Dr. Levern.  Once received she will be scheduled for orientation appt and begin again with credit for whatever number sessions she has already completed. Must understand that she must maintain consistent attendance on this second opportunity.  Attempted to call patient to go over this information, as she had called yesterday requesting we reopen her referral for her to return to cardiac rehab. No answer, left message. Sent MyChart message.

## 2024-04-04 ENCOUNTER — Ambulatory Visit (HOSPITAL_COMMUNITY)

## 2024-04-04 ENCOUNTER — Encounter (HOSPITAL_COMMUNITY)

## 2024-04-06 ENCOUNTER — Encounter (HOSPITAL_COMMUNITY)

## 2024-04-09 ENCOUNTER — Encounter (HOSPITAL_COMMUNITY)

## 2024-04-11 ENCOUNTER — Encounter (HOSPITAL_COMMUNITY)

## 2024-04-13 ENCOUNTER — Encounter (HOSPITAL_COMMUNITY)

## 2024-04-16 ENCOUNTER — Encounter (HOSPITAL_COMMUNITY)

## 2024-04-18 ENCOUNTER — Encounter (HOSPITAL_COMMUNITY)

## 2024-04-20 ENCOUNTER — Encounter (HOSPITAL_COMMUNITY)

## 2024-04-23 ENCOUNTER — Encounter (HOSPITAL_COMMUNITY)

## 2024-04-25 ENCOUNTER — Encounter (HOSPITAL_COMMUNITY)

## 2024-05-28 ENCOUNTER — Ambulatory Visit (INDEPENDENT_AMBULATORY_CARE_PROVIDER_SITE_OTHER)

## 2024-05-28 DIAGNOSIS — I442 Atrioventricular block, complete: Secondary | ICD-10-CM

## 2024-05-29 LAB — CUP PACEART REMOTE DEVICE CHECK
Battery Remaining Longevity: 152 mo
Battery Voltage: 3.15 V
Brady Statistic AP VP Percent: 5.73 %
Brady Statistic AP VS Percent: 0 %
Brady Statistic AS VP Percent: 94.25 %
Brady Statistic AS VS Percent: 0.02 %
Brady Statistic RA Percent Paced: 5.72 %
Brady Statistic RV Percent Paced: 99.98 %
Date Time Interrogation Session: 20251124001159
Implantable Lead Connection Status: 753985
Implantable Lead Connection Status: 753985
Implantable Lead Implant Date: 20250409
Implantable Lead Implant Date: 20250409
Implantable Lead Location: 753858
Implantable Lead Location: 753859
Implantable Lead Model: 3830
Implantable Lead Model: 5076
Implantable Pulse Generator Implant Date: 20250409
Lead Channel Impedance Value: 323 Ohm
Lead Channel Impedance Value: 399 Ohm
Lead Channel Impedance Value: 418 Ohm
Lead Channel Impedance Value: 627 Ohm
Lead Channel Pacing Threshold Amplitude: 0.5 V
Lead Channel Pacing Threshold Amplitude: 0.875 V
Lead Channel Pacing Threshold Pulse Width: 0.4 ms
Lead Channel Pacing Threshold Pulse Width: 0.4 ms
Lead Channel Sensing Intrinsic Amplitude: 3.5 mV
Lead Channel Sensing Intrinsic Amplitude: 3.5 mV
Lead Channel Sensing Intrinsic Amplitude: 31.625 mV
Lead Channel Sensing Intrinsic Amplitude: 31.625 mV
Lead Channel Setting Pacing Amplitude: 1.5 V
Lead Channel Setting Pacing Amplitude: 2 V
Lead Channel Setting Pacing Pulse Width: 0.4 ms
Lead Channel Setting Sensing Sensitivity: 1.2 mV
Zone Setting Status: 755011

## 2024-05-30 NOTE — Progress Notes (Signed)
 Remote PPM Transmission

## 2024-06-04 ENCOUNTER — Ambulatory Visit: Payer: Self-pay | Admitting: Cardiology

## 2024-07-04 ENCOUNTER — Other Ambulatory Visit (HOSPITAL_BASED_OUTPATIENT_CLINIC_OR_DEPARTMENT_OTHER): Payer: Self-pay | Admitting: Nurse Practitioner

## 2024-07-04 DIAGNOSIS — E2839 Other primary ovarian failure: Secondary | ICD-10-CM

## 2024-07-04 DIAGNOSIS — Z780 Asymptomatic menopausal state: Secondary | ICD-10-CM

## 2024-07-10 ENCOUNTER — Ambulatory Visit: Admitting: Physician Assistant

## 2024-07-10 DIAGNOSIS — M7062 Trochanteric bursitis, left hip: Secondary | ICD-10-CM

## 2024-07-10 MED ORDER — BUPIVACAINE HCL 0.25 % IJ SOLN
2.0000 mL | INTRAMUSCULAR | Status: AC | PRN
  Administered 2024-07-10: 2 mL via INTRA_ARTICULAR

## 2024-07-10 MED ORDER — LIDOCAINE HCL 1 % IJ SOLN
3.0000 mL | INTRAMUSCULAR | Status: AC | PRN
  Administered 2024-07-10: 3 mL

## 2024-07-10 MED ORDER — METHYLPREDNISOLONE ACETATE 40 MG/ML IJ SUSP
40.0000 mg | INTRAMUSCULAR | Status: AC | PRN
  Administered 2024-07-10: 40 mg via INTRA_ARTICULAR

## 2024-07-10 NOTE — Progress Notes (Signed)
 "  Office Visit Note   Patient: Carolyn Rowland           Date of Birth: 1941-03-01           MRN: [id number] Visit Date: 07/10/2024              Requested by: Delores Rojelio Caldron, NP [address] [place],  [place] [address] PCP: Delores Rojelio Caldron, NP   Assessment & Plan: Visit Diagnoses:  1. Trochanteric bursitis, left hip     Plan: Impression is left hip trochanteric bursitis with possible lumbar spine component.  I believe the patient's symptoms are most consistent with trochanteric bursitis today.  I have discussed proceeding with cortisone injection for which she would like to proceed.  If this helps her lateral hip pain but she continues to have pain from her lumbar spine, she will follow-up with Megan Williams for further evaluation and treat recommendation.  Follow-Up Instructions: Return if symptoms worsen or fail to improve.   Orders:  Orders Placed This Encounter  Procedures   Large Joint Inj: L greater trochanter   No orders of the defined types were placed in this encounter.     Procedures: Large Joint Inj: L greater trochanter on 07/10/2024 11:13 AM Indications: pain Details: 22 G needle, lateral approach Medications: 3 mL lidocaine  1 %; 2 mL bupivacaine  0.25 %; 40 mg methylPREDNISolone  acetate 40 MG/ML      Clinical Data: No additional findings.   Subjective: No chief complaint on file.   HPI patient is a pleasant 84 year old female who comes in today with left lateral hip pain.  Symptoms began about 2 months ago.  She denies any injury or change in activity.  No pain into the groin.  She does note occasional pain in the buttock into the left shin.  The pain she is having to the left hip primarily occurs when she is lying on her left side.  She has tried topical creams without any relief.  She does note occasional decrease sensation to the left shin.  No bowel or bladder change or saddle paresthesias.  Review of Systems as detailed in HPI.  All  others reviewed and are negative.   Objective: Vital Signs: There were no vitals taken for this visit.  Physical Exam well-developed well-nourished female in no acute distress.  Alert and oriented x 3.  Ortho Exam left hip exam: Marked tenderness to the greater trochanter.  No pain with logroll or FADIR testing.  Negative straight leg raise.  Unremarkable lumbar spine exam.  No focal weakness.  She is neurovascular intact distally.  Specialty Comments:  No specialty comments available.  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   H/O total hip arthroplasty, left 12/26/2019   Spinal stenosis of lumbosacral region 09/22/2017   Breast cancer of upper-outer quadrant of right female breast (HCC) 08/27/2014   Coronary atherosclerosis of native coronary artery 08/18/2013   S/P PTCA (percutaneous transluminal coronary angioplasty) 08/17/2013   Past Medical History:  Diagnosis Date   Anemia    in younger years   Arthritis    hands; legs (08/17/2013)  Coronary artery disease    6 stents in 2015   GERD (gastroesophageal reflux disease)    H/O hiatal hernia    High cholesterol    Hx of adenomatous colonic polyps 08/15/2018   Hypertension    Radiation 11/28/14-01/02/15   Right breast 52 Gray   S/P TAVR (transcatheter aortic valve replacement)    Severe aortic stenosis    Vertigo     Family History  Problem Relation Age of Onset   Cancer Brother    Colon cancer Brother    Cancer Brother    Heart attack Brother    Esophageal cancer Neg Hx    Stomach cancer Neg Hx    Rectal cancer Neg Hx     Past Surgical History:  Procedure Laterality Date    ABDOMINAL AORTOGRAM N/A 08/30/2023   Procedure: ABDOMINAL AORTOGRAM;  Surgeon: Wendel Lurena POUR, MD;  Location: [facility] INVASIVE CV LAB;  Service: Cardiovascular;  Laterality: N/A;   ABDOMINAL HYSTERECTOMY  1985   APPENDECTOMY  1960   BREAST LUMPECTOMY WITH NEEDLE LOCALIZATION AND AXILLARY SENTINEL LYMPH NODE BX Right 10/17/2014   Procedure: BREAST LUMPECTOMY WITH NEEDLE LOCALIZATION AND AXILLARY SENTINEL LYMPH NODE BX;  Surgeon: Krystal Russell, MD;  Location: [facility] OR;  Service: General;  Laterality: Right;   CARDIAC CATHETERIZATION  08/07/2013   COLONOSCOPY     CORONARY ANGIOPLASTY WITH STENT PLACEMENT  08/17/2013   6 stents(08/17/2013)   INTRAOPERATIVE TRANSTHORACIC ECHOCARDIOGRAM N/A 10/11/2023   Procedure: ECHOCARDIOGRAM, TRANSTHORACIC;  Surgeon: Wonda Sharper, MD;  Location[facility] INVASIVE CV LAB;  Service: Cardiovascular;  Laterality: N/A;   LEFT AND RIGHT HEART CATHETERIZATION WITH CORONARY ANGIOGRAM N/A 08/07/2013   Procedure: LEFT AND RIGHT HEART CATHETERIZATION WITH CORONARY ANGIOGRAM;  Surgeon: Erick JONELLE Bergamo, MD;  Location: [facility] CATH LAB;  Service: Cardiovascular;  Laterality: N/A;   PACEMAKER IMPLANT N/A 10/12/2023   Procedure: PACEMAKER IMPLANT;  Surgeon: Kennyth Chew, MD;  Location: [facility] INVASIVE CV LAB;  Service: Cardiovascular;  Laterality: N/A;   PERCUTANEOUS CORONARY STENT INTERVENTION (PCI-S) N/A 08/17/2013   Procedure: PERCUTANEOUS CORONARY STENT INTERVENTION (PCI-S);  Surgeon: Erick JONELLE Bergamo, MD;  Location: [facility] CATH LAB;  Service: Cardiovascular;  Laterality: N/A;   RIGHT HEART CATH AND CORONARY ANGIOGRAPHY N/A 08/30/2023   Procedure: RIGHT HEART CATH AND CORONARY ANGIOGRAPHY;  Surgeon: Wendel Lurena POUR, MD;  Location: [facility] INVASIVE CV LAB;  Service: Cardiovascular;  Laterality: N/A;   TOTAL HIP ARTHROPLASTY Right 12/19/2017   Procedure: RIGHT TOTAL HIP ARTHROPLASTY ANTERIOR APPROACH;  Surgeon: Barbarann Oneil BROCKS, MD;  Location: [facility] OR;  Service: Orthopedics;  Laterality: Right;   TOTAL HIP  ARTHROPLASTY Left 12/26/2019   Procedure: TOTAL HIP ARTHROPLASTY-DIRECT ANTERIOR;  Surgeon: Barbarann Oneil BROCKS, MD;  Location: [facility] OR;  Service: Orthopedics;  Laterality: Left;   TUBAL LIGATION  1980   Social History   Occupational History   Not on file  Tobacco Use   Smoking status: Former    Current packs/day: 0.00    Average packs/day: 1 pack/day for 52.0 years (52.0 ttl pk-yrs)    Types: Cigarettes    Start date: 04/04/1962    Quit date: 04/04/2014    Years since quitting: 10.2   Smokeless tobacco: Never  Vaping Use   Vaping status: Never Used  Substance and Sexual Activity   Alcohol  use: No    Comment: 08/17/2013 quit drinking at age 89; never did drink much   Drug use: No   Sexual activity: Not Currently        "

## 2024-07-29 ENCOUNTER — Emergency Department (HOSPITAL_COMMUNITY)
Admission: EM | Admit: 2024-07-29 | Discharge: 2024-07-29 | Disposition: A | Discharge status: Home / Self Care | Attending: Emergency Medicine | Admitting: Emergency Medicine

## 2024-07-29 ENCOUNTER — Other Ambulatory Visit: Payer: Self-pay

## 2024-07-29 ENCOUNTER — Emergency Department (HOSPITAL_COMMUNITY)

## 2024-07-29 ENCOUNTER — Encounter (HOSPITAL_COMMUNITY): Payer: Self-pay

## 2024-07-29 DIAGNOSIS — I2510 Atherosclerotic heart disease of native coronary artery without angina pectoris: Secondary | ICD-10-CM | POA: Insufficient documentation

## 2024-07-29 DIAGNOSIS — Z950 Presence of cardiac pacemaker: Secondary | ICD-10-CM | POA: Insufficient documentation

## 2024-07-29 DIAGNOSIS — N189 Chronic kidney disease, unspecified: Secondary | ICD-10-CM | POA: Insufficient documentation

## 2024-07-29 DIAGNOSIS — R0789 Other chest pain: Secondary | ICD-10-CM | POA: Insufficient documentation

## 2024-07-29 DIAGNOSIS — K5732 Diverticulitis of large intestine without perforation or abscess without bleeding: Secondary | ICD-10-CM | POA: Diagnosis not present

## 2024-07-29 DIAGNOSIS — R109 Unspecified abdominal pain: Secondary | ICD-10-CM

## 2024-07-29 DIAGNOSIS — Z7982 Long term (current) use of aspirin: Secondary | ICD-10-CM | POA: Diagnosis not present

## 2024-07-29 DIAGNOSIS — K5792 Diverticulitis of intestine, part unspecified, without perforation or abscess without bleeding: Secondary | ICD-10-CM

## 2024-07-29 DIAGNOSIS — R1013 Epigastric pain: Secondary | ICD-10-CM | POA: Diagnosis present

## 2024-07-29 DIAGNOSIS — R112 Nausea with vomiting, unspecified: Secondary | ICD-10-CM

## 2024-07-29 LAB — COMPREHENSIVE METABOLIC PANEL WITH GFR
ALT: 11 U/L (ref 0–44)
AST: 22 U/L (ref 15–41)
Albumin: 3.9 g/dL (ref 3.5–5.0)
Alkaline Phosphatase: 64 U/L (ref 38–126)
Anion gap: 7 (ref 5–15)
BUN: 11 mg/dL (ref 8–23)
CO2: 30 mmol/L (ref 22–32)
Calcium: 9.4 mg/dL (ref 8.9–10.3)
Chloride: 103 mmol/L (ref 98–111)
Creatinine, Ser: 0.81 mg/dL (ref 0.44–1.00)
GFR, Estimated: >60 mL/min
Glucose, Bld: 111 mg/dL — ABNORMAL HIGH (ref 70–99)
Potassium: 5.1 mmol/L (ref 3.5–5.1)
Sodium: 140 mmol/L (ref 135–145)
Total Bilirubin: 0.6 mg/dL (ref 0.0–1.2)
Total Protein: 7.5 g/dL (ref 6.5–8.1)

## 2024-07-29 LAB — CBC WITH DIFFERENTIAL/PLATELET
Abs Immature Granulocytes: 0.02 10*3/uL (ref 0.00–0.07)
Basophils Absolute: 0 10*3/uL (ref 0.0–0.1)
Basophils Relative: 1 %
Eosinophils Absolute: 0.1 10*3/uL (ref 0.0–0.5)
Eosinophils Relative: 1 %
HCT: 42.1 % (ref 36.0–46.0)
Hemoglobin: 15 g/dL (ref 12.0–15.0)
Immature Granulocytes: 0 %
Lymphocytes Relative: 28 %
Lymphs Abs: 2.2 10*3/uL (ref 0.7–4.0)
MCH: 27.8 pg (ref 26.0–34.0)
MCHC: 35.6 g/dL (ref 30.0–36.0)
MCV: 78 fL — ABNORMAL LOW (ref 80.0–100.0)
Monocytes Absolute: 0.7 10*3/uL (ref 0.1–1.0)
Monocytes Relative: 9 %
Neutro Abs: 4.8 10*3/uL (ref 1.7–7.7)
Neutrophils Relative %: 61 %
Platelets: 148 10*3/uL — ABNORMAL LOW (ref 150–400)
RBC: 5.4 MIL/uL — ABNORMAL HIGH (ref 3.87–5.11)
RDW: 15.4 % (ref 11.5–15.5)
WBC: 7.7 10*3/uL (ref 4.0–10.5)
nRBC: 0 % (ref 0.0–0.2)

## 2024-07-29 LAB — LIPASE, BLOOD: Lipase: 30 U/L (ref 11–51)

## 2024-07-29 LAB — TROPONIN T, HIGH SENSITIVITY
Troponin T High Sensitivity: 14 ng/L (ref 0–19)
Troponin T High Sensitivity: 12 ng/L (ref 0–19)

## 2024-07-29 MED ORDER — LIDOCAINE VISCOUS HCL 2 % MT SOLN
15.0000 mL | Freq: Once | OROMUCOSAL | Status: AC
  Administered 2024-07-29: 15 mL via ORAL
  Filled 2024-07-29: qty 15

## 2024-07-29 MED ORDER — ONDANSETRON 4 MG PO TBDP: 4.0000 mg | ORAL_TABLET | Freq: Three times a day (TID) | ORAL | 0 refills | Status: AC | PRN

## 2024-07-29 MED ORDER — IOHEXOL 350 MG/ML SOLN
75.0000 mL | Freq: Once | INTRAVENOUS | Status: AC | PRN
  Administered 2024-07-29: 75 mL via INTRAVENOUS

## 2024-07-29 MED ORDER — CIPROFLOXACIN IN D5W 400 MG/200ML IV SOLN
400.0000 mg | Freq: Once | INTRAVENOUS | Status: AC
  Administered 2024-07-29: 400 mg via INTRAVENOUS
  Filled 2024-07-29: qty 200

## 2024-07-29 MED ORDER — HYDROCODONE-ACETAMINOPHEN 5-325 MG PO TABS: 1.0000 | ORAL_TABLET | ORAL | 0 refills | Status: AC | PRN

## 2024-07-29 MED ORDER — METRONIDAZOLE 500 MG/100ML IV SOLN
500.0000 mg | Freq: Once | INTRAVENOUS | Status: AC
  Administered 2024-07-29: 500 mg via INTRAVENOUS
  Filled 2024-07-29: qty 100

## 2024-07-29 MED ORDER — ALUM & MAG HYDROXIDE-SIMETH 200-200-20 MG/5ML PO SUSP
30.0000 mL | Freq: Once | ORAL | Status: AC
  Administered 2024-07-29: 30 mL via ORAL
  Filled 2024-07-29: qty 30

## 2024-07-29 MED ORDER — CIPROFLOXACIN HCL 500 MG PO TABS: 500.0000 mg | ORAL_TABLET | Freq: Two times a day (BID) | ORAL | 0 refills | Status: AC

## 2024-07-29 MED ORDER — METRONIDAZOLE 500 MG PO TABS: 500.0000 mg | ORAL_TABLET | Freq: Two times a day (BID) | ORAL | 0 refills | Status: AC

## 2024-07-29 MED ORDER — ONDANSETRON HCL 4 MG/2ML IJ SOLN
4.0000 mg | Freq: Once | INTRAMUSCULAR | Status: AC
  Administered 2024-07-29: 4 mg via INTRAVENOUS
  Filled 2024-07-29: qty 2

## 2024-07-29 NOTE — ED Provider Notes (Signed)
 " [facility] AT [facility] Provider Note   CSN: [id number] Arrival date & time: 07/29/24  0430     Patient presents with: Chest Pain   Carolyn Rowland is a 84 y.o. female.   The history is provided by the patient, the EMS personnel and medical records.  Carolyn Rowland is a 84 y.o. female who presents to the Emergency Department complaining of chest pain.  She presents to the emergency department by EMS for evaluation of lower chest/epigastric pain that woke her from sleep at 3 AM.  Pain was sudden and severe at time of onset with associated diaphoresis and frequent belching.  She took 2 nitroglycerin  with significant improvement in her pain after about 5 minutes.  No prior similar symptoms no vomiting or nausea but is belching frequently.  No shortness of breath, diarrhea, dysuria, fevers.  She has a history of coronary artery disease status post stenting, pacemaker placement, CKD, severe aortic stenosis status post TAVR.  No history of prior abdominal surgeries.  No anticoagulation.     Prior to Admission medications  Medication Sig Start Date End Date Taking? Authorizing Provider  amLODipine  (NORVASC ) 2.5 MG tablet Take 2.5 mg by mouth daily.    [provider]  aspirin  81 MG EC tablet Take 81 mg by mouth daily.     [provider]  azithromycin  (ZITHROMAX ) 500 MG tablet Take 1 tablet (500 mg total) by mouth as directed. Take one tablet 1 hour before any dental work including cleanings. 10/26/23   Sebastian Lamarr SAUNDERS, PA-C  cholecalciferol  (VITAMIN D3) 25 MCG (1000 UNIT) tablet Take 1,000 Units by mouth daily.    [provider]  isosorbide  mononitrate (IMDUR ) 60 MG 24 hr tablet Take 1 tablet (60 mg total) by mouth daily. 08/07/13   Ladona Heinz, MD  LINZESS 72 MCG capsule Take 72 mcg by mouth daily as needed (constipation). 07/29/23   [provider]  metoprolol  succinate (TOPROL -XL) 50 MG 24 hr tablet Take 50 mg by mouth daily.  Take with or immediately following a meal.    [provider]  Multiple Minerals-Vitamins (CALCIUM  & VIT D3 BONE HEALTH PO) Take 1 tablet by mouth daily.    [provider]  nitroGLYCERIN  (NITROSTAT ) 0.4 MG SL tablet Place 1 tablet (0.4 mg total) under the tongue every 5 (five) minutes as needed for chest pain. 08/07/13   Ladona Heinz, MD  omeprazole (PRILOSEC) 40 MG capsule Take 40 mg by mouth daily as needed (acid reflux).  11/14/17   [provider]  rosuvastatin  (CRESTOR ) 20 MG tablet Take 20 mg by mouth daily. 12/30/14   [provider]  spironolactone  (ALDACTONE ) 25 MG tablet Take 12.5 mg by mouth daily.    [provider]    Allergies: Penicillins    Review of Systems  All other systems reviewed and are negative.   Updated Vital Signs BP (!) 156/80   Pulse 78   Temp 98.3 F (36.8 C)   Resp 18   Ht 5' 5 (1.651 m)   Wt 94 kg   SpO2 100%   BMI 34.49 kg/m   Physical Exam Vitals and nursing note reviewed.  Constitutional:      General: She is not in acute distress.    Appearance: Normal appearance. She is well-developed.  HENT:     Head: Normocephalic and atraumatic.  Cardiovascular:     Rate and Rhythm: Normal rate and regular rhythm.     Heart  sounds: No murmur heard. Pulmonary:     Effort: Pulmonary effort is normal. No respiratory distress.  Abdominal:     Palpations: Abdomen is soft.     Tenderness: There is no guarding or rebound.     Comments: Moderate right upper quadrant, right lower quadrant and epigastric tenderness  Musculoskeletal:        General: No swelling or tenderness.  Skin:    General: Skin is warm and dry.     Capillary Refill: Capillary refill takes less than 2 seconds.  Neurological:     Mental Status: She is alert and oriented to person, place, and time.  Psychiatric:        Behavior: Behavior normal.     (all labs ordered are listed, but only abnormal results are displayed) Labs Reviewed  CBC  WITH DIFFERENTIAL/PLATELET - Abnormal; Notable for the following components:      Result Value   RBC 5.40 (*)    MCV 78.0 (*)    Platelets 148 (*)    All other components within normal limits  COMPREHENSIVE METABOLIC PANEL WITH GFR - Abnormal; Notable for the following components:   Glucose, Bld 111 (*)    All other components within normal limits  LIPASE, BLOOD  TROPONIN T, HIGH SENSITIVITY  TROPONIN T, HIGH SENSITIVITY    EKG: EKG Interpretation Date/Time:  Sunday July 29 2024 04:43:03 EST Ventricular Rate:  84 PR Interval:  193 QRS Duration:  124 QT Interval:  409 QTC Calculation: 484 R Axis:   64  Text Interpretation: Sinus rhythm Consider right atrial enlargement Right bundle branch block Confirmed by Griselda Norris [phone]) on 07/29/2024 4:46:00 AM  Radiology: [facility] Chest Port 1 View Result Date: 07/29/2024 CLINICAL DATA:  Chest pain.  Status post TAVR. EXAM: PORTABLE CHEST 1 VIEW COMPARISON:  10/13/2023 FINDINGS: The lungs are clear without focal pneumonia, edema, pneumothorax or pleural effusion. The cardiopericardial silhouette is within normal limits for size. Status post TAVR. Left-sided permanent pacemaker again noted. Telemetry leads overlie the chest. IMPRESSION: No active disease. Electronically Signed   By: Camellia Candle M.D.   On: 07/29/2024 05:09     Procedures   Medications Ordered in the ED - No data to display                                  Medical Decision Making Amount and/or Complexity of Data Reviewed Labs: ordered. Radiology: ordered.   Patient with extensive cardiac history status post multiple stents, TAVR and permanent pacemaker placement here for evaluation of what she describes as chest pain bit more epigastric pain.  On examination she has tenderness to the right lower quadrant.  EKG is nonischemic.  Initial troponin is negative.  Labs are unremarkable at this point.  Given her tenderness on abdominal exam a CT abdomen pelvis is obtained.   Patient care transferred pending CT scan and repeat troponin.  Patient declines any pain medications in the emergency department.     Final diagnoses:  None    ED Discharge Orders     None          Griselda Norris, MD 07/29/24 [phone]  "

## 2024-07-29 NOTE — Discharge Instructions (Signed)
 Your history, exam, workup today led us  to get labs and imaging that revealed acute diverticulitis without complication.  You were able to get the IV antibiotics today and were feeling better after medications.  Your heart enzymes were negative both times we checked them and asked the GI cocktail helped after you swallowed it I suspect some of your discomfort in your torso and upper stomach and lower chest was due to some burning from your esophagus that improved with that medication.  Please take the antibiotics for the next 10 days and use the pain and nausea medicine to help with symptom management.  Please rest and stay hydrated and follow-up with your primary doctor.  If any symptoms change or worsen acutely, please return to the nearest emergency department.

## 2024-07-29 NOTE — ED Provider Notes (Signed)
 Care assumed from Dr. Griselda.  At time of transfer of care, patient waiting on CT of pelvis and troponin and reassessment determine disposition.  CT scan does indeed show diverticulitis without complication.  Initial troponin is normal, still waiting on the second 1.  Anticipate reassessment to determine disposition.  7:53 AM Due to the patient's burning discomfort in her epigastric area and going towards her chest will give a GI cocktail as she thinks it may be related to belching and stomach acid.  Will also give her IV antibiotics while we wait for a second troponin to return to treat the diverticulitis we discovered.  Anticipate discussion about discharge versus admission after that is completed.  11:09 AM Patient feeling much better after medications and antibiotics.  She was able to eat and drink without difficulty and ambulate.  She still wants to go home so we will give prescription for antibiotics pain medicine and nausea medicine.  We discussed the medication she can tolerate and she agrees.  She will pick her prescriptions up and rest at home.  She understood return precautions and follow-up instructions and was discharged in stable condition.   Clinical Impression: 1. Diverticulitis   2. Abdominal pain, unspecified abdominal location   3. Atypical chest pain   4. Nausea and vomiting, unspecified vomiting type     Disposition: Discharge  Condition: Good  I have discussed the results, Dx and Tx plan with the pt(& family if present). He/she/they expressed understanding and agree(s) with the plan. Discharge instructions discussed at great length. Strict return precautions discussed and pt &/or family have verbalized understanding of the instructions. No further questions at time of discharge.    New Prescriptions   CIPROFLOXACIN  (CIPRO ) 500 MG TABLET    Take 1 tablet (500 mg total) by mouth 2 (two) times daily for 10 days.   HYDROCODONE -ACETAMINOPHEN  (NORCO/VICODIN) 5-325 MG TABLET     Take 1 tablet by mouth every 4 (four) hours as needed.   METRONIDAZOLE  (FLAGYL ) 500 MG TABLET    Take 1 tablet (500 mg total) by mouth 2 (two) times daily for 10 days.   ONDANSETRON  (ZOFRAN -ODT) 4 MG DISINTEGRATING TABLET    Take 1 tablet (4 mg total) by mouth every 8 (eight) hours as needed for nausea or vomiting.    Follow Up: Delores Rojelio Caldron, NP [address] [place] [address] [phone]     [facility] Emergency Department at [facility] [address] [place]  [address] [phone]       Dayna Alia, Lonni PARAS, MD 07/29/24 [phone]

## 2024-07-29 NOTE — ED Triage Notes (Addendum)
 Pt to ED from home with c/o epigastric/chest pain which woke her up from her sleep this morning. Pt endorses taking her home NGL and ASA at home. Pt has a lengthy cardiac history, is burping throughout triage. VSS, NADN.

## 2024-08-27 ENCOUNTER — Encounter

## 2024-10-15 ENCOUNTER — Ambulatory Visit: Admitting: Physician Assistant

## 2024-10-15 ENCOUNTER — Other Ambulatory Visit (HOSPITAL_COMMUNITY)

## 2024-11-27 ENCOUNTER — Encounter

## 2025-02-26 ENCOUNTER — Encounter

## 2025-05-28 ENCOUNTER — Encounter

## 2025-08-27 ENCOUNTER — Encounter

## 2025-11-26 ENCOUNTER — Encounter
